# Patient Record
Sex: Female | Born: 1955 | Race: White | Hispanic: No | Smoking: Current every day smoker
Health system: Southern US, Community
[De-identification: ages and names within clinical notes are randomized; demographics above are authoritative.]

## PROBLEM LIST (undated history)

## (undated) DIAGNOSIS — I10 Essential (primary) hypertension: Secondary | ICD-10-CM

## (undated) DIAGNOSIS — F329 Major depressive disorder, single episode, unspecified: Secondary | ICD-10-CM

## (undated) DIAGNOSIS — J189 Pneumonia, unspecified organism: Secondary | ICD-10-CM

## (undated) DIAGNOSIS — M545 Low back pain, unspecified: Secondary | ICD-10-CM

## (undated) DIAGNOSIS — M48061 Spinal stenosis, lumbar region without neurogenic claudication: Secondary | ICD-10-CM

## (undated) DIAGNOSIS — S72009A Fracture of unspecified part of neck of unspecified femur, initial encounter for closed fracture: Secondary | ICD-10-CM

## (undated) DIAGNOSIS — M47819 Spondylosis without myelopathy or radiculopathy, site unspecified: Secondary | ICD-10-CM

## (undated) DIAGNOSIS — R51 Headache: Secondary | ICD-10-CM

## (undated) DIAGNOSIS — M459 Ankylosing spondylitis of unspecified sites in spine: Secondary | ICD-10-CM

## (undated) DIAGNOSIS — M1990 Unspecified osteoarthritis, unspecified site: Secondary | ICD-10-CM

## (undated) DIAGNOSIS — C50919 Malignant neoplasm of unspecified site of unspecified female breast: Secondary | ICD-10-CM

## (undated) DIAGNOSIS — R519 Headache, unspecified: Secondary | ICD-10-CM

## (undated) DIAGNOSIS — Z9289 Personal history of other medical treatment: Secondary | ICD-10-CM

## (undated) DIAGNOSIS — G4700 Insomnia, unspecified: Secondary | ICD-10-CM

## (undated) DIAGNOSIS — B1920 Unspecified viral hepatitis C without hepatic coma: Secondary | ICD-10-CM

## (undated) DIAGNOSIS — G8929 Other chronic pain: Secondary | ICD-10-CM

## (undated) DIAGNOSIS — Z9221 Personal history of antineoplastic chemotherapy: Secondary | ICD-10-CM

## (undated) DIAGNOSIS — F32A Depression, unspecified: Secondary | ICD-10-CM

## (undated) DIAGNOSIS — K921 Melena: Secondary | ICD-10-CM

## (undated) DIAGNOSIS — Z923 Personal history of irradiation: Secondary | ICD-10-CM

## (undated) DIAGNOSIS — F419 Anxiety disorder, unspecified: Secondary | ICD-10-CM

## (undated) DIAGNOSIS — M810 Age-related osteoporosis without current pathological fracture: Secondary | ICD-10-CM

## (undated) HISTORY — PX: FEMUR FRACTURE SURGERY: SHX633

## (undated) HISTORY — DX: Headache, unspecified: R51.9

## (undated) HISTORY — DX: Spondylosis without myelopathy or radiculopathy, site unspecified: M47.819

## (undated) HISTORY — DX: Unspecified viral hepatitis C without hepatic coma: B19.20

## (undated) HISTORY — DX: Age-related osteoporosis without current pathological fracture: M81.0

## (undated) HISTORY — DX: Depression, unspecified: F32.A

## (undated) HISTORY — DX: Ankylosing spondylitis of unspecified sites in spine: M45.9

## (undated) HISTORY — DX: Malignant neoplasm of unspecified site of unspecified female breast: C50.919

## (undated) HISTORY — DX: Low back pain: M54.5

## (undated) HISTORY — PX: FRACTURE SURGERY: SHX138

## (undated) HISTORY — PX: TUBAL LIGATION: SHX77

## (undated) HISTORY — DX: Unspecified osteoarthritis, unspecified site: M19.90

## (undated) HISTORY — DX: Spinal stenosis, lumbar region without neurogenic claudication: M48.061

## (undated) HISTORY — DX: Essential (primary) hypertension: I10

## (undated) HISTORY — DX: Low back pain, unspecified: M54.50

## (undated) HISTORY — DX: Other chronic pain: G89.29

## (undated) HISTORY — DX: Headache: R51

## (undated) HISTORY — DX: Melena: K92.1

## (undated) HISTORY — PX: FOOT SURGERY: SHX648

## (undated) HISTORY — DX: Personal history of other medical treatment: Z92.89

## (undated) HISTORY — DX: Insomnia, unspecified: G47.00

## (undated) HISTORY — DX: Major depressive disorder, single episode, unspecified: F32.9

---

## 1981-01-02 HISTORY — PX: BREAST BIOPSY: SHX20

## 2002-01-02 HISTORY — PX: KNEE SURGERY: SHX244

## 2005-01-02 DIAGNOSIS — B1920 Unspecified viral hepatitis C without hepatic coma: Secondary | ICD-10-CM

## 2005-01-02 HISTORY — DX: Unspecified viral hepatitis C without hepatic coma: B19.20

## 2005-11-22 ENCOUNTER — Emergency Department: Payer: Self-pay | Admitting: Internal Medicine

## 2005-12-30 ENCOUNTER — Other Ambulatory Visit: Payer: Self-pay

## 2005-12-30 ENCOUNTER — Emergency Department: Payer: Self-pay

## 2006-03-19 LAB — HM PAP SMEAR

## 2006-09-24 ENCOUNTER — Emergency Department: Payer: Self-pay | Admitting: Emergency Medicine

## 2008-01-03 DIAGNOSIS — C50919 Malignant neoplasm of unspecified site of unspecified female breast: Secondary | ICD-10-CM

## 2008-01-03 HISTORY — PX: BREAST EXCISIONAL BIOPSY: SUR124

## 2008-01-03 HISTORY — DX: Malignant neoplasm of unspecified site of unspecified female breast: C50.919

## 2008-01-03 HISTORY — PX: BREAST LUMPECTOMY: SHX2

## 2008-04-15 ENCOUNTER — Ambulatory Visit: Payer: Self-pay

## 2008-04-22 ENCOUNTER — Ambulatory Visit: Payer: Self-pay

## 2008-05-21 ENCOUNTER — Ambulatory Visit: Payer: Self-pay | Admitting: General Surgery

## 2008-05-25 ENCOUNTER — Ambulatory Visit: Payer: Self-pay | Admitting: General Surgery

## 2008-05-28 ENCOUNTER — Ambulatory Visit: Payer: Self-pay | Admitting: General Surgery

## 2008-06-02 ENCOUNTER — Ambulatory Visit: Payer: Self-pay | Admitting: Oncology

## 2008-06-09 ENCOUNTER — Ambulatory Visit: Payer: Self-pay | Admitting: Oncology

## 2008-06-22 ENCOUNTER — Ambulatory Visit: Payer: Self-pay | Admitting: General Surgery

## 2008-07-02 ENCOUNTER — Ambulatory Visit: Payer: Self-pay | Admitting: Oncology

## 2008-08-02 ENCOUNTER — Ambulatory Visit: Payer: Self-pay | Admitting: Oncology

## 2008-09-02 ENCOUNTER — Ambulatory Visit: Payer: Self-pay | Admitting: Oncology

## 2008-09-10 ENCOUNTER — Emergency Department: Payer: Self-pay | Admitting: Emergency Medicine

## 2008-10-02 ENCOUNTER — Ambulatory Visit: Payer: Self-pay | Admitting: Oncology

## 2008-11-02 ENCOUNTER — Ambulatory Visit: Payer: Self-pay | Admitting: Oncology

## 2008-12-02 ENCOUNTER — Ambulatory Visit: Payer: Self-pay | Admitting: Oncology

## 2008-12-09 ENCOUNTER — Ambulatory Visit: Payer: Self-pay

## 2009-01-02 ENCOUNTER — Ambulatory Visit: Payer: Self-pay | Admitting: Oncology

## 2009-02-02 ENCOUNTER — Ambulatory Visit: Payer: Self-pay | Admitting: Oncology

## 2009-02-16 ENCOUNTER — Inpatient Hospital Stay: Payer: Self-pay | Admitting: Psychiatry

## 2009-03-02 ENCOUNTER — Ambulatory Visit: Payer: Self-pay | Admitting: Oncology

## 2009-03-08 ENCOUNTER — Emergency Department: Payer: Self-pay | Admitting: Emergency Medicine

## 2009-04-02 ENCOUNTER — Ambulatory Visit: Payer: Self-pay | Admitting: Oncology

## 2009-04-05 ENCOUNTER — Ambulatory Visit: Payer: Self-pay | Admitting: Oncology

## 2009-04-27 ENCOUNTER — Ambulatory Visit: Payer: Self-pay | Admitting: General Surgery

## 2009-05-02 ENCOUNTER — Ambulatory Visit: Payer: Self-pay | Admitting: Oncology

## 2009-05-22 ENCOUNTER — Emergency Department: Payer: Self-pay | Admitting: Emergency Medicine

## 2009-06-02 ENCOUNTER — Ambulatory Visit: Payer: Self-pay | Admitting: Oncology

## 2009-06-19 ENCOUNTER — Emergency Department: Payer: Self-pay | Admitting: Emergency Medicine

## 2009-07-02 ENCOUNTER — Ambulatory Visit: Payer: Self-pay | Admitting: Oncology

## 2009-07-15 ENCOUNTER — Inpatient Hospital Stay: Payer: Self-pay | Admitting: Psychiatry

## 2009-08-02 ENCOUNTER — Ambulatory Visit: Payer: Self-pay | Admitting: Oncology

## 2009-09-02 ENCOUNTER — Ambulatory Visit: Payer: Self-pay | Admitting: Oncology

## 2009-09-02 ENCOUNTER — Emergency Department: Payer: Self-pay | Admitting: Internal Medicine

## 2009-09-08 ENCOUNTER — Ambulatory Visit: Payer: Self-pay | Admitting: Oncology

## 2009-09-14 LAB — CANCER ANTIGEN 27.29: CA 27.29: 27.7 U/mL (ref 0.0–38.6)

## 2009-10-02 ENCOUNTER — Ambulatory Visit: Payer: Self-pay | Admitting: Oncology

## 2009-10-20 ENCOUNTER — Ambulatory Visit: Payer: Self-pay

## 2009-11-02 ENCOUNTER — Ambulatory Visit: Payer: Self-pay | Admitting: Oncology

## 2009-12-08 ENCOUNTER — Ambulatory Visit: Payer: Self-pay | Admitting: Oncology

## 2009-12-09 LAB — CANCER ANTIGEN 27.29: CA 27.29: 30.2 U/mL (ref 0.0–38.6)

## 2010-01-02 ENCOUNTER — Ambulatory Visit: Payer: Self-pay | Admitting: Oncology

## 2010-01-15 ENCOUNTER — Emergency Department: Payer: Self-pay | Admitting: Emergency Medicine

## 2010-03-06 ENCOUNTER — Emergency Department: Payer: Self-pay | Admitting: Emergency Medicine

## 2010-03-09 ENCOUNTER — Ambulatory Visit: Payer: Self-pay | Admitting: Oncology

## 2010-03-10 LAB — CANCER ANTIGEN 27.29: CA 27.29: 31.3 U/mL (ref 0.0–38.6)

## 2010-04-03 ENCOUNTER — Ambulatory Visit: Payer: Self-pay | Admitting: Oncology

## 2010-04-17 ENCOUNTER — Emergency Department: Payer: Self-pay | Admitting: Emergency Medicine

## 2010-04-28 ENCOUNTER — Ambulatory Visit: Payer: Self-pay

## 2010-06-30 ENCOUNTER — Ambulatory Visit: Payer: Self-pay | Admitting: Oncology

## 2010-07-01 LAB — CANCER ANTIGEN 27.29: CA 27.29: 31.5 U/mL (ref 0.0–38.6)

## 2010-07-03 ENCOUNTER — Ambulatory Visit: Payer: Self-pay | Admitting: Oncology

## 2010-07-12 ENCOUNTER — Ambulatory Visit: Payer: Self-pay | Admitting: Pain Medicine

## 2010-07-24 ENCOUNTER — Emergency Department: Payer: Self-pay | Admitting: Unknown Physician Specialty

## 2010-09-07 ENCOUNTER — Emergency Department: Payer: Self-pay | Admitting: Emergency Medicine

## 2010-10-28 ENCOUNTER — Emergency Department: Payer: Self-pay

## 2010-12-01 ENCOUNTER — Ambulatory Visit: Payer: Self-pay | Admitting: General Surgery

## 2010-12-09 ENCOUNTER — Emergency Department: Payer: Self-pay | Admitting: Emergency Medicine

## 2011-01-06 ENCOUNTER — Ambulatory Visit: Payer: Self-pay | Admitting: Oncology

## 2011-01-19 ENCOUNTER — Ambulatory Visit: Payer: Self-pay | Admitting: Oncology

## 2011-01-20 LAB — CANCER ANTIGEN 27.29: CA 27.29: 30.6 U/mL (ref 0.0–38.6)

## 2011-02-03 ENCOUNTER — Ambulatory Visit: Payer: Self-pay | Admitting: Oncology

## 2011-02-03 LAB — CBC
HGB: 12.2 g/dL (ref 12.0–16.0)
MCH: 34.1 pg — ABNORMAL HIGH (ref 26.0–34.0)
MCHC: 34 g/dL (ref 32.0–36.0)
MCV: 100 fL (ref 80–100)
RBC: 3.58 10*6/uL — ABNORMAL LOW (ref 3.80–5.20)
WBC: 6.1 10*3/uL (ref 3.6–11.0)

## 2011-02-03 LAB — COMPREHENSIVE METABOLIC PANEL
Albumin: 3.5 g/dL (ref 3.4–5.0)
Alkaline Phosphatase: 115 U/L (ref 50–136)
Anion Gap: 6 — ABNORMAL LOW (ref 7–16)
Bilirubin,Total: 0.3 mg/dL (ref 0.2–1.0)
Calcium, Total: 8.8 mg/dL (ref 8.5–10.1)
Chloride: 102 mmol/L (ref 98–107)
Co2: 31 mmol/L (ref 21–32)
EGFR (African American): >60
EGFR (Non-African Amer.): >60
Glucose: 95 mg/dL (ref 65–99)
Osmolality: 277 (ref 275–301)
SGPT (ALT): 151 U/L — ABNORMAL HIGH
Sodium: 139 mmol/L (ref 136–145)

## 2011-02-03 LAB — ACETAMINOPHEN LEVEL: Acetaminophen: <2 ug/mL

## 2011-02-03 LAB — SALICYLATE LEVEL: Salicylates, Serum: 3 mg/dL — ABNORMAL HIGH

## 2011-02-03 LAB — ETHANOL
Ethanol %: <0.003 % (ref 0.000–0.080)
Ethanol: <3 mg/dL

## 2011-02-04 ENCOUNTER — Inpatient Hospital Stay: Payer: Self-pay | Admitting: Psychiatry

## 2011-02-04 LAB — URINALYSIS, COMPLETE
Blood: NEGATIVE
Glucose,UR: NEGATIVE mg/dL (ref 0–75)
Nitrite: NEGATIVE
Ph: 5 (ref 4.5–8.0)
Specific Gravity: 1.018 (ref 1.003–1.030)
Squamous Epithelial: 1

## 2011-02-04 LAB — DRUG SCREEN, URINE
Amphetamines, Ur Screen: NEGATIVE (ref ?–1000)
Benzodiazepine, Ur Scrn: POSITIVE (ref ?–200)
Cannabinoid 50 Ng, Ur ~~LOC~~: NEGATIVE (ref ?–50)
MDMA (Ecstasy)Ur Screen: NEGATIVE (ref ?–500)
Opiate, Ur Screen: NEGATIVE (ref ?–300)
Tricyclic, Ur Screen: NEGATIVE (ref ?–1000)

## 2011-02-06 LAB — HEPATIC FUNCTION PANEL A (ARMC)
Albumin: 3.6 g/dL
Alkaline Phosphatase: 152 U/L — ABNORMAL HIGH
Bilirubin, Direct: <0.1 mg/dL
Bilirubin,Total: 0.3 mg/dL
SGOT(AST): 104 U/L — ABNORMAL HIGH
SGPT (ALT): 138 U/L — ABNORMAL HIGH
Total Protein: 7.7 g/dL

## 2011-02-06 LAB — AMMONIA: Ammonia, Plasma: <25 umol/L

## 2011-02-07 LAB — FOLATE: Folic Acid: 12.4 ng/mL (ref 3.1–100.0)

## 2011-02-07 LAB — HEPATIC FUNCTION PANEL A (ARMC)
Albumin: 3.4 g/dL (ref 3.4–5.0)
Bilirubin, Direct: 0.1 mg/dL (ref 0.00–0.20)
Bilirubin,Total: 0.2 mg/dL (ref 0.2–1.0)
SGOT(AST): 114 U/L — ABNORMAL HIGH (ref 15–37)
Total Protein: 7.4 g/dL (ref 6.4–8.2)

## 2011-02-09 ENCOUNTER — Ambulatory Visit: Payer: Self-pay | Admitting: Oncology

## 2011-03-23 ENCOUNTER — Emergency Department: Payer: Self-pay | Admitting: Emergency Medicine

## 2011-03-23 LAB — CBC
HCT: 41.1 % (ref 35.0–47.0)
HGB: 14 g/dL (ref 12.0–16.0)
MCH: 33.7 pg (ref 26.0–34.0)
MCHC: 34 g/dL (ref 32.0–36.0)
MCV: 99 fL (ref 80–100)
Platelet: 256 10*3/uL (ref 150–440)
WBC: 7.3 10*3/uL (ref 3.6–11.0)

## 2011-03-23 LAB — BASIC METABOLIC PANEL
Anion Gap: 13 (ref 7–16)
BUN: 8 mg/dL (ref 7–18)
Co2: 24 mmol/L (ref 21–32)
Creatinine: 1.78 mg/dL — ABNORMAL HIGH (ref 0.60–1.30)
EGFR (African American): 38 — ABNORMAL LOW
Glucose: 94 mg/dL (ref 65–99)
Potassium: 3.8 mmol/L (ref 3.5–5.1)

## 2011-05-17 ENCOUNTER — Ambulatory Visit: Payer: Self-pay | Admitting: General Surgery

## 2011-06-24 ENCOUNTER — Emergency Department: Payer: Self-pay | Admitting: *Deleted

## 2011-08-01 ENCOUNTER — Emergency Department: Payer: Self-pay | Admitting: Emergency Medicine

## 2011-08-08 ENCOUNTER — Emergency Department: Payer: Self-pay | Admitting: *Deleted

## 2011-09-01 ENCOUNTER — Emergency Department: Payer: Self-pay | Admitting: Emergency Medicine

## 2011-09-01 LAB — BASIC METABOLIC PANEL
Anion Gap: 8 (ref 7–16)
BUN: 17 mg/dL (ref 7–18)
Calcium, Total: 9.5 mg/dL (ref 8.5–10.1)
EGFR (African American): >60
EGFR (Non-African Amer.): >60
Osmolality: 275 (ref 275–301)
Potassium: 3.9 mmol/L (ref 3.5–5.1)
Sodium: 137 mmol/L (ref 136–145)

## 2011-09-01 LAB — URINALYSIS, COMPLETE
Bacteria: NONE SEEN
Bilirubin,UR: NEGATIVE
Glucose,UR: NEGATIVE mg/dL (ref 0–75)
Ketone: NEGATIVE
Ph: 5 (ref 4.5–8.0)
Specific Gravity: 1.025 (ref 1.003–1.030)
Squamous Epithelial: 9
WBC UR: 32 /HPF (ref 0–5)

## 2011-09-01 LAB — CBC
HCT: 41.7 % (ref 35.0–47.0)
MCV: 98 fL (ref 80–100)
Platelet: 216 10*3/uL (ref 150–440)
RBC: 4.26 10*6/uL (ref 3.80–5.20)
WBC: 6.2 10*3/uL (ref 3.6–11.0)

## 2011-09-02 LAB — HEPATIC FUNCTION PANEL A (ARMC)
Alkaline Phosphatase: 150 U/L — ABNORMAL HIGH (ref 50–136)
SGPT (ALT): 158 U/L — ABNORMAL HIGH (ref 12–78)
Total Protein: 8.4 g/dL — ABNORMAL HIGH (ref 6.4–8.2)

## 2011-09-02 LAB — LIPASE, BLOOD: Lipase: 170 U/L (ref 73–393)

## 2011-09-02 LAB — TROPONIN I: Troponin-I: <0.02 ng/mL

## 2011-09-07 ENCOUNTER — Ambulatory Visit: Payer: Self-pay | Admitting: Oncology

## 2011-09-08 LAB — CANCER ANTIGEN 27.29: CA 27.29: 29.2 U/mL (ref 0.0–38.6)

## 2011-09-21 ENCOUNTER — Emergency Department: Payer: Self-pay | Admitting: Emergency Medicine

## 2011-09-22 LAB — COMPREHENSIVE METABOLIC PANEL
Alkaline Phosphatase: 144 U/L — ABNORMAL HIGH (ref 50–136)
Bilirubin,Total: 0.4 mg/dL (ref 0.2–1.0)
Chloride: 101 mmol/L (ref 98–107)
Co2: 30 mmol/L (ref 21–32)
Creatinine: 0.63 mg/dL (ref 0.60–1.30)
EGFR (African American): >60
Osmolality: 282 (ref 275–301)
SGPT (ALT): 167 U/L — ABNORMAL HIGH (ref 12–78)
Sodium: 140 mmol/L (ref 136–145)
Total Protein: 8.2 g/dL (ref 6.4–8.2)

## 2011-09-22 LAB — URINALYSIS, COMPLETE
Bacteria: NONE SEEN
Glucose,UR: NEGATIVE mg/dL (ref 0–75)
Ketone: NEGATIVE
Nitrite: NEGATIVE
Protein: NEGATIVE
Specific Gravity: 1.021 (ref 1.003–1.030)
WBC UR: 2 /HPF (ref 0–5)

## 2011-09-22 LAB — CBC WITH DIFFERENTIAL/PLATELET
Basophil #: 0.1 10*3/uL (ref 0.0–0.1)
Eosinophil #: 0.2 10*3/uL (ref 0.0–0.7)
HCT: 40.9 % (ref 35.0–47.0)
Lymphocyte %: 35.6 %
MCH: 33.7 pg (ref 26.0–34.0)
MCHC: 34.4 g/dL (ref 32.0–36.0)
Neutrophil %: 53.5 %
Platelet: 236 10*3/uL (ref 150–440)
RDW: 13.8 % (ref 11.5–14.5)

## 2011-09-22 LAB — LIPASE, BLOOD: Lipase: 115 U/L (ref 73–393)

## 2011-10-03 ENCOUNTER — Ambulatory Visit: Payer: Self-pay | Admitting: Oncology

## 2011-10-05 DIAGNOSIS — M1990 Unspecified osteoarthritis, unspecified site: Secondary | ICD-10-CM | POA: Insufficient documentation

## 2011-12-22 ENCOUNTER — Emergency Department: Payer: Self-pay | Admitting: Emergency Medicine

## 2012-02-03 ENCOUNTER — Ambulatory Visit: Payer: Self-pay | Admitting: Oncology

## 2012-02-24 ENCOUNTER — Emergency Department: Payer: Self-pay | Admitting: Emergency Medicine

## 2012-02-24 LAB — COMPREHENSIVE METABOLIC PANEL
Albumin: 3.7 g/dL (ref 3.4–5.0)
Anion Gap: 9 (ref 7–16)
BUN: 17 mg/dL (ref 7–18)
Bilirubin,Total: 0.5 mg/dL (ref 0.2–1.0)
Chloride: 104 mmol/L (ref 98–107)
Co2: 23 mmol/L (ref 21–32)
Creatinine: 0.48 mg/dL — ABNORMAL LOW (ref 0.60–1.30)
EGFR (Non-African Amer.): >60
Glucose: 104 mg/dL — ABNORMAL HIGH (ref 65–99)
Potassium: 3.7 mmol/L (ref 3.5–5.1)
SGOT(AST): 93 U/L — ABNORMAL HIGH (ref 15–37)
Sodium: 136 mmol/L (ref 136–145)

## 2012-02-24 LAB — URINALYSIS, COMPLETE
Bacteria: NONE SEEN
Glucose,UR: NEGATIVE mg/dL (ref 0–75)
Leukocyte Esterase: NEGATIVE
Ph: 5 (ref 4.5–8.0)
Protein: NEGATIVE
RBC,UR: <1 /HPF (ref 0–5)
Specific Gravity: 1.016 (ref 1.003–1.030)
WBC UR: 1 /HPF (ref 0–5)

## 2012-02-24 LAB — TROPONIN I: Troponin-I: <0.02 ng/mL

## 2012-02-24 LAB — CBC
HGB: 14 g/dL (ref 12.0–16.0)
MCH: 32.3 pg (ref 26.0–34.0)
MCV: 95 fL (ref 80–100)
Platelet: 241 10*3/uL (ref 150–440)

## 2012-03-02 ENCOUNTER — Ambulatory Visit: Payer: Self-pay | Admitting: Oncology

## 2012-03-08 LAB — CANCER ANTIGEN 27.29: CA 27.29: 21.8 U/mL (ref 0.0–38.6)

## 2012-04-02 ENCOUNTER — Ambulatory Visit: Payer: Self-pay | Admitting: Oncology

## 2012-05-20 ENCOUNTER — Ambulatory Visit: Payer: Self-pay | Admitting: Oncology

## 2012-06-11 ENCOUNTER — Emergency Department: Payer: Self-pay | Admitting: Emergency Medicine

## 2012-06-11 LAB — COMPREHENSIVE METABOLIC PANEL
Alkaline Phosphatase: 139 U/L — ABNORMAL HIGH (ref 50–136)
BUN: 17 mg/dL (ref 7–18)
Calcium, Total: 8.9 mg/dL (ref 8.5–10.1)
Chloride: 104 mmol/L (ref 98–107)
Co2: 27 mmol/L (ref 21–32)
EGFR (African American): >60
Glucose: 136 mg/dL — ABNORMAL HIGH (ref 65–99)
Potassium: 3.6 mmol/L (ref 3.5–5.1)
SGOT(AST): 186 U/L — ABNORMAL HIGH (ref 15–37)
SGPT (ALT): 195 U/L — ABNORMAL HIGH (ref 12–78)
Sodium: 138 mmol/L (ref 136–145)
Total Protein: 7.9 g/dL (ref 6.4–8.2)

## 2012-06-11 LAB — CBC
HCT: 40.8 % (ref 35.0–47.0)
MCH: 33.6 pg (ref 26.0–34.0)
MCHC: 34.5 g/dL (ref 32.0–36.0)
RDW: 14.9 % — ABNORMAL HIGH (ref 11.5–14.5)
WBC: 6 10*3/uL (ref 3.6–11.0)

## 2012-10-03 ENCOUNTER — Ambulatory Visit: Payer: Self-pay | Admitting: Oncology

## 2012-11-11 ENCOUNTER — Ambulatory Visit: Payer: Self-pay | Admitting: Oncology

## 2012-12-02 ENCOUNTER — Ambulatory Visit: Payer: Self-pay | Admitting: Oncology

## 2013-03-08 ENCOUNTER — Emergency Department: Payer: Self-pay | Admitting: Emergency Medicine

## 2013-03-20 ENCOUNTER — Ambulatory Visit: Payer: Self-pay | Admitting: Oncology

## 2013-03-28 ENCOUNTER — Ambulatory Visit: Payer: Self-pay | Admitting: Oncology

## 2013-04-02 ENCOUNTER — Ambulatory Visit: Payer: Self-pay | Admitting: Oncology

## 2013-07-07 ENCOUNTER — Ambulatory Visit: Payer: Self-pay | Admitting: Family Medicine

## 2013-09-14 ENCOUNTER — Emergency Department: Payer: Self-pay | Admitting: Emergency Medicine

## 2013-09-14 LAB — COMPREHENSIVE METABOLIC PANEL
ALBUMIN: 3.5 g/dL (ref 3.4–5.0)
ANION GAP: 6 — AB (ref 7–16)
AST: 127 U/L — AB (ref 15–37)
Alkaline Phosphatase: 139 U/L — ABNORMAL HIGH
BUN: 12 mg/dL (ref 7–18)
Bilirubin,Total: 0.4 mg/dL (ref 0.2–1.0)
CALCIUM: 9.2 mg/dL (ref 8.5–10.1)
CREATININE: 0.71 mg/dL (ref 0.60–1.30)
Chloride: 104 mmol/L (ref 98–107)
Co2: 28 mmol/L (ref 21–32)
EGFR (Non-African Amer.): >60
GLUCOSE: 107 mg/dL — AB (ref 65–99)
Osmolality: 276 (ref 275–301)
POTASSIUM: 4.1 mmol/L (ref 3.5–5.1)
SGPT (ALT): 165 U/L — ABNORMAL HIGH
Sodium: 138 mmol/L (ref 136–145)
TOTAL PROTEIN: 8 g/dL (ref 6.4–8.2)

## 2013-09-14 LAB — CBC
HCT: 43.4 % (ref 35.0–47.0)
HGB: 14.3 g/dL (ref 12.0–16.0)
MCH: 32.4 pg (ref 26.0–34.0)
MCHC: 32.9 g/dL (ref 32.0–36.0)
MCV: 98 fL (ref 80–100)
Platelet: 211 10*3/uL (ref 150–440)
RBC: 4.41 10*6/uL (ref 3.80–5.20)
RDW: 14 % (ref 11.5–14.5)
WBC: 6.4 10*3/uL (ref 3.6–11.0)

## 2013-09-16 ENCOUNTER — Encounter: Payer: Self-pay | Admitting: *Deleted

## 2013-09-29 ENCOUNTER — Ambulatory Visit: Payer: Self-pay | Admitting: General Surgery

## 2013-10-07 ENCOUNTER — Ambulatory Visit: Payer: Self-pay | Admitting: General Surgery

## 2013-10-15 ENCOUNTER — Inpatient Hospital Stay: Payer: Self-pay | Admitting: Psychiatry

## 2013-10-15 LAB — DRUG SCREEN, URINE
AMPHETAMINES, UR SCREEN: NEGATIVE (ref ?–1000)
BENZODIAZEPINE, UR SCRN: POSITIVE (ref ?–200)
Barbiturates, Ur Screen: NEGATIVE (ref ?–200)
CANNABINOID 50 NG, UR ~~LOC~~: NEGATIVE (ref ?–50)
Cocaine Metabolite,Ur ~~LOC~~: NEGATIVE (ref ?–300)
MDMA (Ecstasy)Ur Screen: NEGATIVE (ref ?–500)
METHADONE, UR SCREEN: NEGATIVE (ref ?–300)
OPIATE, UR SCREEN: NEGATIVE (ref ?–300)
PHENCYCLIDINE (PCP) UR S: NEGATIVE (ref ?–25)
Tricyclic, Ur Screen: NEGATIVE (ref ?–1000)

## 2013-10-15 LAB — COMPREHENSIVE METABOLIC PANEL
ALBUMIN: 3.4 g/dL (ref 3.4–5.0)
ALK PHOS: 155 U/L — AB
ALT: 201 U/L — AB
AST: 159 U/L — AB (ref 15–37)
Anion Gap: 9 (ref 7–16)
BILIRUBIN TOTAL: 0.2 mg/dL (ref 0.2–1.0)
BUN: 12 mg/dL (ref 7–18)
Calcium, Total: 8 mg/dL — ABNORMAL LOW (ref 8.5–10.1)
Chloride: 107 mmol/L (ref 98–107)
Co2: 26 mmol/L (ref 21–32)
Creatinine: 0.79 mg/dL (ref 0.60–1.30)
EGFR (Non-African Amer.): >60
GLUCOSE: 104 mg/dL — AB (ref 65–99)
Osmolality: 283 (ref 275–301)
Potassium: 3.3 mmol/L — ABNORMAL LOW (ref 3.5–5.1)
Sodium: 142 mmol/L (ref 136–145)
Total Protein: 7.9 g/dL (ref 6.4–8.2)

## 2013-10-15 LAB — CBC
HCT: 42.6 % (ref 35.0–47.0)
HGB: 13.7 g/dL (ref 12.0–16.0)
MCH: 31.5 pg (ref 26.0–34.0)
MCHC: 32.2 g/dL (ref 32.0–36.0)
MCV: 98 fL (ref 80–100)
Platelet: 220 10*3/uL (ref 150–440)
RBC: 4.35 10*6/uL (ref 3.80–5.20)
RDW: 13.9 % (ref 11.5–14.5)
WBC: 7.9 10*3/uL (ref 3.6–11.0)

## 2013-10-15 LAB — URINALYSIS, COMPLETE
Bacteria: NONE SEEN
Bilirubin,UR: NEGATIVE
Glucose,UR: NEGATIVE mg/dL (ref 0–75)
Ketone: NEGATIVE
Leukocyte Esterase: NEGATIVE
NITRITE: NEGATIVE
PH: 5 (ref 4.5–8.0)
PROTEIN: NEGATIVE
RBC,UR: 6 /HPF (ref 0–5)
Specific Gravity: 1.019 (ref 1.003–1.030)
Squamous Epithelial: 2
WBC UR: 3 /HPF (ref 0–5)

## 2013-10-15 LAB — SALICYLATE LEVEL: Salicylates, Serum: 12.2 mg/dL — ABNORMAL HIGH

## 2013-10-15 LAB — ACETAMINOPHEN LEVEL: Acetaminophen: <2 ug/mL

## 2013-10-15 LAB — ETHANOL: Ethanol: <3 mg/dL

## 2013-10-20 ENCOUNTER — Telehealth: Payer: Self-pay | Admitting: Family Medicine

## 2013-10-20 NOTE — Telephone Encounter (Signed)
I am part time now and not working in more new patients- I am actually cutting back on them.  I need to keep appointments open for current patients.  Please see if another provider can see her.

## 2013-10-20 NOTE — Telephone Encounter (Signed)
Patient has a new patient appointment in December.  Patient was admitted to [facility].  She admitted herself for depression.  Patient is passing blood in her stool.  Patient wants to know if you can see her sooner than December.

## 2013-10-28 ENCOUNTER — Telehealth: Payer: Self-pay

## 2013-10-28 NOTE — Telephone Encounter (Signed)
Pt left v/m; pt received letter from [facility] that Dr Deborra Medina had approved Ketorolac tromathamine; pt has taken this med before and med did not help. Pt request cb why Dr Deborra Medina approved this med. Pt has appt with Dr Deborra Medina on 12/23/13.Please advise.

## 2013-10-29 ENCOUNTER — Encounter: Payer: Self-pay | Admitting: *Deleted

## 2013-10-29 NOTE — Telephone Encounter (Signed)
This pt has never been seen in this office and does not establish until Dec. I have not processed a PA for this pt and we are unaware what she is speaking of.

## 2013-10-29 NOTE — Telephone Encounter (Signed)
Patient notified as instructed by telephone. Pt states she will wait to see Dr Deborra Medina in Dec.

## 2013-11-10 ENCOUNTER — Ambulatory Visit: Payer: Self-pay | Admitting: Oncology

## 2013-12-23 ENCOUNTER — Encounter: Payer: Self-pay | Admitting: Family Medicine

## 2013-12-23 ENCOUNTER — Ambulatory Visit (INDEPENDENT_AMBULATORY_CARE_PROVIDER_SITE_OTHER): Payer: Medicare Other | Admitting: Family Medicine

## 2013-12-23 VITALS — BP 122/70 | HR 67 | Temp 97.8°F | Ht 61.0 in | Wt 148.8 lb

## 2013-12-23 DIAGNOSIS — Z853 Personal history of malignant neoplasm of breast: Secondary | ICD-10-CM | POA: Insufficient documentation

## 2013-12-23 DIAGNOSIS — G8929 Other chronic pain: Secondary | ICD-10-CM | POA: Insufficient documentation

## 2013-12-23 DIAGNOSIS — F329 Major depressive disorder, single episode, unspecified: Secondary | ICD-10-CM | POA: Insufficient documentation

## 2013-12-23 DIAGNOSIS — M457 Ankylosing spondylitis of lumbosacral region: Secondary | ICD-10-CM

## 2013-12-23 DIAGNOSIS — F39 Unspecified mood [affective] disorder: Secondary | ICD-10-CM | POA: Insufficient documentation

## 2013-12-23 DIAGNOSIS — F418 Other specified anxiety disorders: Secondary | ICD-10-CM

## 2013-12-23 DIAGNOSIS — F32A Depression, unspecified: Secondary | ICD-10-CM | POA: Insufficient documentation

## 2013-12-23 NOTE — Progress Notes (Signed)
Pre visit review using our clinic review tool, if applicable. No additional management support is needed unless otherwise documented below in the visit note. 

## 2013-12-23 NOTE — Assessment & Plan Note (Signed)
>  30 minutes spent in face to face time with patient, >50% spent in counselling or coordination of care. I looked her up in the controlled substances data base- last received oxycodone- 20 tablets on 09/16/13 from Dr. Corky Downs eventhough she told me she has not had any in over a year.  I explained she also has red flags listed in her past history and under no circumstances would I prescribe her narcotics or any other controlled substances, including xanax.  I did offer (multiple times) to refer her to pain clinic and psychiatrist.  She refused and got up and left- very angry with the outcome of this appointment.  "I don't understand why you cannot give me anything for pain today."  Doctor shopping is illegal and if she does call office again looking for narcotics or other controlled substances, staff needs to reiterate that I will not prescribe them for her.

## 2013-12-23 NOTE — Progress Notes (Signed)
Subjective:   Patient ID: Janet Zuniga, female    DOB: 1955/12/11, 58 y.o.   MRN: [id number]  Janet Zuniga is a pleasant 58 y.o. year old female with h/o breast CA ( 5 years ago s/p chemo and radiation), Hep C, anxiety and depression who presents to clinic today with Establish Care and Depression  on 12/23/2013  HPI:  Chronic pain- on disability for this.  States she is followed by [facility] for Ankylosing spondylitis. Looked up her chart in care everywhere- last seen at [facility] on 10/05/2011.  Note reviewed- Dr. Koleen Nimrod stated that she was not diagnosed with AS and also discussed her medication overuse and drug seeking behavior.  She is here today because she is asking for me to give her something for pain.  Was previously prescribed oxycodone- has not had it in "over a year."  Was referred to pain clinic per pt but they would not see her.  Also is asking for xanax.  States she has been very depressed and anxious lately.  She is taking prozac. According to Care Everywhere, she was admitted to behavioral health and did attempt suicide by overdosing on xanax. It is also mentioned in previous notes that she had seizures when withdrawing from xanax in past. Also told me she has not been prescribed anything else for her depression or anxiety but seroquel is on her med list from [place].  No current outpatient prescriptions on file prior to visit.   No current facility-administered medications on file prior to visit.    Allergies  Allergen Reactions  . Penicillins Rash    Past Medical History  Diagnosis Date  . Arthritis   . Blood in stool   . Hepatitis C 2007  . History of blood transfusion     Past Surgical History  Procedure Laterality Date  . Foot surgery  2000 and 2003  . Knee surgery Left 2004  . Breast lumpectomy Left 2010    Family History  Problem Relation Age of Onset  . Arthritis Mother   . Stroke Mother   . Heart disease Mother   . Hypertension Mother     . Hypertension Father   . Hypertension Brother   . Heart disease Maternal Aunt     History   Social History  . Marital Status: Single    Spouse Name: N/A    Number of Children: N/A  . Years of Education: N/A   Occupational History  . Not on file.   Social History Main Topics  . Smoking status: Current Every Day Smoker  . Smokeless tobacco: Never Used  . Alcohol Use: Yes  . Drug Use: No  . Sexual Activity: No   Other Topics Concern  . Not on file   Social History Narrative  . No narrative on file   The PMH, PSH, Social History, Family History, Medications, and allergies have been reviewed in [facility], and have been updated if relevant.   Review of Systems    See HPI Pan positive- pain "everywhere," anxiety, depression, swelling of joints, fatigue, decreased appetite, abdominal pain, nausea, headaches Objective:    BP 122/70 mmHg  Pulse 67  Temp(Src) 97.8 F (36.6 C) (Oral)  Ht 5\' 1"  (1.549 m)  Wt 148 lb 12 oz (67.473 kg)  BMI 28.12 kg/m2  SpO2 97%   Physical Exam  Constitutional: She is oriented to person, place, and time. She appears well-developed and well-nourished.  tearful  HENT:  Head: Normocephalic.  Cardiovascular: Normal  rate.   Pulmonary/Chest: Effort normal.  Neurological: She is alert and oriented to person, place, and time.  Skin: Skin is warm and dry.  Psychiatric:  Histrionic, tearful, emotionally labile  Nursing note and vitals reviewed.         Assessment & Plan:   History of breast cancer in female  Chronic pain  Ankylosing spondylitis of lumbosacral region No Follow-up on file.

## 2013-12-24 ENCOUNTER — Telehealth: Payer: Self-pay | Admitting: Family Medicine

## 2013-12-24 NOTE — Telephone Encounter (Signed)
emmi mailed  °

## 2014-01-18 DIAGNOSIS — M545 Low back pain: Secondary | ICD-10-CM | POA: Diagnosis not present

## 2014-03-12 DIAGNOSIS — B1920 Unspecified viral hepatitis C without hepatic coma: Secondary | ICD-10-CM | POA: Insufficient documentation

## 2014-03-12 DIAGNOSIS — M545 Low back pain, unspecified: Secondary | ICD-10-CM | POA: Insufficient documentation

## 2014-04-03 ENCOUNTER — Ambulatory Visit
Admit: 2014-04-03 | Disposition: A | Discharge status: Home / Self Care | Payer: Self-pay | Attending: Oncology | Admitting: Oncology

## 2014-04-21 ENCOUNTER — Emergency Department
Admit: 2014-04-21 | Disposition: A | Discharge status: Home / Self Care | Payer: Self-pay | Admitting: Emergency Medicine

## 2014-04-21 LAB — COMPREHENSIVE METABOLIC PANEL
ALBUMIN: 4.3 g/dL
ANION GAP: 9 (ref 7–16)
Alkaline Phosphatase: 127 U/L — ABNORMAL HIGH
BILIRUBIN TOTAL: 0.8 mg/dL
BUN: 8 mg/dL
Calcium, Total: 9 mg/dL
Chloride: 106 mmol/L
Co2: 25 mmol/L
Creatinine: 1.29 mg/dL — ABNORMAL HIGH
EGFR (African American): 54 — ABNORMAL LOW
EGFR (Non-African Amer.): 46 — ABNORMAL LOW
GLUCOSE: 119 mg/dL — AB
POTASSIUM: 3.4 mmol/L — AB
SGOT(AST): 77 U/L — ABNORMAL HIGH
SGPT (ALT): 78 U/L — ABNORMAL HIGH
SODIUM: 140 mmol/L
Total Protein: 8.6 g/dL — ABNORMAL HIGH

## 2014-04-21 LAB — CBC WITH DIFFERENTIAL/PLATELET
Basophil #: 0.1 10*3/uL (ref 0.0–0.1)
Basophil %: 1.8 %
EOS ABS: 0 10*3/uL (ref 0.0–0.7)
Eosinophil %: 0.3 %
HCT: 45.6 % (ref 35.0–47.0)
HGB: 15.2 g/dL (ref 12.0–16.0)
LYMPHS PCT: 37.5 %
Lymphocyte #: 2.4 10*3/uL (ref 1.0–3.6)
MCH: 32.8 pg (ref 26.0–34.0)
MCHC: 33.3 g/dL (ref 32.0–36.0)
MCV: 98 fL (ref 80–100)
Monocyte #: 0.4 x10 3/mm (ref 0.2–0.9)
Monocyte %: 6.8 %
NEUTROS ABS: 3.4 10*3/uL (ref 1.4–6.5)
Neutrophil %: 53.6 %
Platelet: 202 10*3/uL (ref 150–440)
RBC: 4.63 10*6/uL (ref 3.80–5.20)
RDW: 14.8 % — ABNORMAL HIGH (ref 11.5–14.5)
WBC: 6.4 10*3/uL (ref 3.6–11.0)

## 2014-04-21 LAB — TROPONIN I: Troponin-I: <0.03 ng/mL

## 2014-04-21 LAB — LIPASE, BLOOD: LIPASE: 32 U/L

## 2014-04-22 LAB — TROPONIN I

## 2014-04-25 NOTE — Consult Note (Signed)
PATIENT NAME:  Janet Zuniga, STONG MR#:  [id number] DATE OF BIRTH:  03/17/1955  DATE OF CONSULTATION:  10/15/2013  CONSULTING PHYSICIAN:  Gonzella Lex, MD  IDENTIFYING INFORMATION AND CHIEF COMPLAINT:  A 59 year old woman who came to the Emergency Room with chief complaint:  "I'm having a nervous breakdown."   HISTORY OF PRESENT ILLNESS:  The patient complains of severe depression accompanied by difficulty eating, difficulty sleeping, and constant low mood. She has been having suicidal thoughts, wishing that she were dead. She also has chronic pain problems and has been having worsening of her pain. She was not able to get into a pain clinic appointment. She has a past history of breast cancer. She has major stresses in her life currently. She reports that she cannot sleep well at night. She feels like she has no support from her family. She is not currently getting any outpatient psychiatric treatment.   PAST PSYCHIATRIC HISTORY:  She was last in the hospital about 4 years ago after an overdose. She has been treated with antidepressants in the past. She was previously on Prozac and Xanax. She has not been on Xanax now for some time from what I can tell. She does have a past history of suicide attempts.   PAST MEDICAL HISTORY:  The patient has ankylosing spondylitis and has chronic pain in her hips, legs, and arms. She has not been able to get into a pain clinic. She was previously taking narcotics, but it is unclear whether she has been able to get any recently. Additionally, she has chronic anxiety. Past history of breast cancer, which has been in remission for the past 5 years.   SUBSTANCE ABUSE HISTORY:  The patient denies alcohol, at least no problem with it, very occasional use. Denies that she abuses prescription drugs or any other drugs.   FAMILY HISTORY:  Unknown family history, probably none.   SOCIAL HISTORY:  The patient is living by herself. She has family around, but feels they are not  supportive.   CURRENT MEDICATIONS:  She has been taking Prozac 40 mg for years. She occasionally has gotten narcotics, not clear if she is getting any right now. No other known medication.   REVIEW OF SYSTEMS:  Cannot sleep. Cannot eat. Feels nervous all the time, depressed all the time. Passive suicidal ideation. No hallucinations. Pain in her upper extremities and legs and hips. Otherwise negative.   MENTAL STATUS EXAMINATION:  Slightly disheveled older woman who looks older than her stated age. Cooperative. Good eye contact. Normal psychomotor activity. Speech: Normal rate, tone, and volume. Affect:  Anxious and dysphoric. Mood stated as bad. Thoughts are lucid. No loosening of associations. Denies auditory or visual hallucinations. Denies homicidal ideation. Passive suicidal thoughts. Alert and oriented x 4. Can remember 3/3 objects immediately and 3/3 at 3 minutes. Long-term memory is intact. Judgment and insight are adequate. Normal fund of knowledge.   LABORATORY RESULTS:  Drug screen is positive for benzodiazepines, otherwise negative. Chemistry panel:  Elevated glucose of 104, potassium low at 3.3, elevated ALT of 201, elevated AST of 159. Alcohol level is negative. CBC is all normal.   Urinalysis:  Blood 1+, no bacteria, fewer white cells than red cells. Salicylates slightly elevated at 12.2.   VITAL SIGNS:  Temperature 98.1, pulse 81, respirations 20, blood pressure currently 160/90.   ASSESSMENT:  This is a 59 year old woman with a history of anxiety and depression and chronic pain, who comes in with passive suicidal ideation, feeling anxious  and overwhelmed and unsupported, cannot sleep well, needs admission because of suicidality and depression.   TREATMENT PLAN:  Admit to psychiatry. Restart the Prozac 20 mg a day. Trazodone 100 mg at night. She is now complaining that she wants Seroquel instead, so that is being started at 100 mg at night. Suicide precautions as well as close and  elopement precautions and fall precautions in place.   DIAGNOSIS, PRINCIPAL AND PRIMARY:  AXIS I:  Major depression, recurrent, severe.   SECONDARY DIAGNOSES: AXIS I:  Deferred.   AXIS II:  Deferred.   AXIS III:  Chronic pain, history of breast cancer.    ____________________________ Gonzella Lex, MD jtc:nb D: 10/15/2013 22:43:01 ET T: 10/15/2013 23:20:04 ET JOB#: [id number]  cc: Gonzella Lex, MD, <Dictator> Gonzella Lex MD ELECTRONICALLY SIGNED 10/16/2013 1:08

## 2014-04-25 NOTE — H&P (Signed)
PATIENT NAME:  Janet, Zuniga MR#:  [id number] DATE OF BIRTH:  [birth year]  DATE OF ADMISSION:  10/15/2013  REFERRING PHYSICIAN: Emergency Room MD.   ATTENDING PHYSICIAN: Baylen Buckner B. Bary Leriche, MD    IDENTIFYING DATA: Janet Zuniga is a 59 year old female with history of depression and anxiety.   CHIEF COMPLAINT: " I have been crying for 6 weeks."   HISTORY OF PRESENT ILLNESS: Janet Zuniga has a long history of anxiety for years. She has been misusing Xanax as prescribed by Dr. Brunetta Genera.  It is unclear when was last time that she did have a proper psychiatrist.  She reports that last prescription she received from Dr. Brunetta Genera, possibly before his office closed was in December 2014.  She was able to stretch her Xanax so she discontinued it gently without symptoms of Xanax withdrawal in February. She has been doing well ever since.  She reports good mood, normal level of activity, church participations, spending time with her family.  She now tells me that for the past 2 weeks or 6 weeks depends on how you ask, she has been increasingly depressed, crying, sad, with insomnia, feeling of guilt, hopelessness, worthlessness, poor energy and concentration, social isolation. She denies suicidal ideations.  She came to the hospital believing that if we can start her on the Xanax, that she will be fine.  She is not interested in taking any other medications, except for the Xanax.  She has no intention to follow up with a psychiatrist but lucky for her, she was able to secure an appointment with a new primary care provider.  She has been without one for several months, as her old doctor stopped taking her insurance.  She is hoping that if we start the Xanax, her primary care provider is going to continue it.  She reports multiple stressors.  First of all, her car was repossessed.  She could not afford it.  She stayed with her son for 5 months.  She felt that she took good care of his children and the household, but something  must have been wrong because she is no longer allowed to see grandchildren and the son has no contact with her.  She still has some church friends but feels very isolated and lonely.  She has absolutely nothing to do, nothing to look forward to.  She denies symptoms suggestive of bipolar mania.  She reports heightened anxiety with panic attacks.  She denies psychotic symptoms. She denies alcohol, illicit drugs or prescription pill abuse, but on admission, she was positive for benzodiazepines.   PAST PSYCHIATRIC HISTORY: She was hospitalized at [facility] psychiatric unit at least 3 times; last time in February 2013.  She has 1 suicide attempt by a Xanax overdose but denies it was a suicide intention.  At none of the discharges was the patient given  benzodiazepines.  Some admissions, she was diagnosed with bipolar disorder and offered antipsychotics and mood stabilizers. Diagnoses also included personality disorder and benzodiazepine dependence.  She has been tried on Risperdal, Cymbalta and Depakote among other medications.     FAMILY PSYCHIATRIC HISTORY: Mother had anxiety and depression on Xanax.   PAST MEDICAL HISTORY: Breast cancer survival of 4 years, ankylosing spondylitis.   ALLERGIES: PENICILLIN.   MEDICATIONS ON ADMISSION:  According to the list in the system; Prozac 40 mg, Xanax 1 mg 3 or 4 times a day,  Colace 100 mg twice a day. Soma 350 at 4 times a day.  I do  not believe the patient has any of these medications still prescribed.   SOCIAL HISTORY: She now lives by herself, very lonely. She does  have disability and  Medicare.   REVIEW OF SYSTEMS.  CONSTITUTIONAL: No fevers or chills. No weight changes.  EYES: No double or blurred vision.  ENT: No hearing loss.  RESPIRATORY: No shortness of breath or cough.  CARDIOVASCULAR: No chest pain or orthopnea.  GASTROINTESTINAL: No abdominal pain, nausea, vomiting, or diarrhea.  GENITOURINARY: No incontinence or  frequency.  ENDOCRINE: No heat or cold intolerance.  LYMPHATIC: No anemia or easy bruising.  INTEGUMENTARY: No acne or rash.  MUSCULOSKELETAL: Positive for back pain.  NEUROLOGIC: No tingling or weakness.  PSYCHIATRIC: See history of present illness for details.   PHYSICAL EXAMINATION:  VITAL SIGNS: Blood pressure 153/104, pulse 77, respirations 20, temperature 98.7.  GENERAL: This is a well-developed, middle-aged female in no acute distress.  HEENT: The pupils are equal, round, and reactive to light. Sclerae are anicteric.  NECK: Supple. No thyromegaly.  LUNGS: Clear to auscultation. No dullness to percussion.  HEART: Regular rhythm and rate. No murmurs, rubs, or gallops.  ABDOMEN: Soft, nontender, nondistended. Positive bowel sounds.  MUSCULOSKELETAL: Normal muscle strength in all extremities.  SKIN: No rashes or bruises.  LYMPHATIC: No cervical adenopathy.  NEUROLOGIC: Cranial nerves II through XII are intact.   LABORATORY DATA: Chemistries are within normal limits except for potassium of 3.3. Blood alcohol level is zero.  LFTs; alkaline phosphatase 155, AST 159, ALT 2001.  Urine tox screen is positive for benzodiazepines.  CBC within normal limits. Urinalysis is not suggestive of urinary tract infection. Serum acetaminophen less than 2. Serum salicylates 27.2.   MENTAL STATUS EXAMINATION ON ADMISSION:  The patient is alert and oriented to person, place, time and situation. She is pleasant, polite and cooperative, but irritable and demanding.  She recognizes me from previous admissions. She maintains good eye contact. She is well-groomed and casually dressed.  Her speech is of normal rhythm, rate and volume. Mood is depressed.  She has not been crying with lateral affect.  Thought process is logical and goal oriented with its own logic. She denies thoughts of hurting herself or others, but came to the hospital very depressed, unable to cope and press on.  There are no delusions or paranoia.  There are no auditory or visual hallucinations. Her cognition is grossly intact.  Registration, recall, long and short-term memory are intact.  She is of average intelligence and fund of knowledge.  Her insight and judgment are limited.    SUICIDE RISK ASSESSMENT ON ADMISSION:  This is a patient with a long history of depression, anxiety, and benzodiazepine abuse who came to the hospital complaining of worsening depression, but most likely to obtain benzodiazepines as she is positive on admission and wants to be restarted on the Xanax.  This is inappropriate.   INITIAL DIAGNOSES:  AXIS I: Major depressive disorder, recurrent, severe, history of diagnosis of bipolar disorder, benzodiazepine dependence.  AXIS II: Deferred.  AXIS III: Breast cancer survivor, ankylosing spondylitis, hepatitis.    PLAN:  The patient was admitted to [place] unit for safety, stabilization and medication management.  She was initially placed on suicide precautions and was closely monitored for any unsafe behaviors.  She underwent full psychiatric and risk assessment. She received pharmacotherapy, individual and group psychotherapy, substance abuse counseling, and support from therapeutic milieu.  1.  Suicidal ideation. She is able to contract for safety.  2.  Mood.  She was started on Prozac in the Emergency Room and trazodone for sleep along with Seroquel at bedtime. She complained that she did not sleep at all.  3.  Benzodiazepine withdrawal. She is likely in withdrawal.  We will offer brief Librium taper. No benzodiazepines will be prescribed for home.  We will offer hydroxyzine for symptoms of anxiety.  She is voluntarily here and it is quite likely that she will try to discharge herself against medical advice.  4.  Hepatitis.  Her liver function tests are quite elevated.  She also is positive for Tylenol,  I do not think she should be getting that.  It is unclear what pain  medication is available to her for her back problems.   DISPOSITION: She will be discharged back to home. She does not wish to be followed by a psychiatrist.     ____________________________ Wardell Honour. Bary Leriche, MD jbp:DT D: 10/16/2013 14:52:55 ET T: 10/16/2013 15:25:07 ET JOB#: [id number]  cc: Nicholle Falzon B. Bary Leriche, MD, <Dictator> Clovis Fredrickson MD ELECTRONICALLY SIGNED 11/10/2013 6:16
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
## 2014-05-04 DIAGNOSIS — F411 Generalized anxiety disorder: Secondary | ICD-10-CM | POA: Insufficient documentation

## 2014-05-11 ENCOUNTER — Ambulatory Visit: Payer: Self-pay | Admitting: Oncology

## 2014-05-15 DIAGNOSIS — G894 Chronic pain syndrome: Secondary | ICD-10-CM | POA: Insufficient documentation

## 2014-05-15 DIAGNOSIS — M47819 Spondylosis without myelopathy or radiculopathy, site unspecified: Secondary | ICD-10-CM | POA: Insufficient documentation

## 2014-05-15 DIAGNOSIS — I7300 Raynaud's syndrome without gangrene: Secondary | ICD-10-CM | POA: Insufficient documentation

## 2014-05-15 DIAGNOSIS — M48061 Spinal stenosis, lumbar region without neurogenic claudication: Secondary | ICD-10-CM | POA: Insufficient documentation

## 2014-05-23 ENCOUNTER — Emergency Department
Admission: EM | Admit: 2014-05-23 | Discharge: 2014-05-24 | Disposition: A | Discharge status: Home / Self Care | Payer: Medicare Other | Attending: Emergency Medicine | Admitting: Emergency Medicine

## 2014-05-23 DIAGNOSIS — Z79899 Other long term (current) drug therapy: Secondary | ICD-10-CM | POA: Insufficient documentation

## 2014-05-23 DIAGNOSIS — Z720 Tobacco use: Secondary | ICD-10-CM | POA: Diagnosis not present

## 2014-05-23 DIAGNOSIS — Z880 Allergy status to penicillin: Secondary | ICD-10-CM | POA: Diagnosis not present

## 2014-05-23 DIAGNOSIS — Z760 Encounter for issue of repeat prescription: Secondary | ICD-10-CM | POA: Insufficient documentation

## 2014-05-23 NOTE — ED Notes (Signed)
Pt walked up to desk, despite saying she was unable to walk, and asked about wait time.
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
## 2014-05-23 NOTE — ED Notes (Signed)
Pt asking about wait time. Pt in NAD

## 2014-05-24 MED ORDER — OXYCODONE-ACETAMINOPHEN 5-325 MG PO TABS: 1.0000 | ORAL_TABLET | ORAL | Status: DC | PRN

## 2014-05-24 MED ORDER — OXYCODONE-ACETAMINOPHEN 5-325 MG PO TABS
2.0000 | ORAL_TABLET | Freq: Once | ORAL | Status: AC
  Administered 2014-05-24: 2 via ORAL

## 2014-05-24 MED ORDER — OXYCODONE-ACETAMINOPHEN 5-325 MG PO TABS
ORAL_TABLET | ORAL | Status: AC
  Filled 2014-05-24: qty 2
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
## 2014-05-24 NOTE — ED Notes (Signed)
Pt again requesting "when is the doctor going to get in here?" i don't want to wait any longer. Explanation of md evaluation process provided to pt again.

## 2014-05-24 NOTE — ED Notes (Signed)
Pt states "i want the doctor to come in here now, i want my xanax. i don't understand why you can't just give me what i ask for right now, what's the hold up?' explanation of md evaluation process and treatment process provided to pt.

## 2014-05-24 NOTE — ED Notes (Signed)
Pt states "when is the doctor coming in here? i want my pain medicine and my xanax now, i don't want to wait another two hours." pt updated again on md assessment and possible arrival process.

## 2014-05-24 NOTE — ED Notes (Signed)
Pt again requesting "where is my xanax, when is the doctor going to get here and give it to me?" explanation of md evaluation process again provided to pt. tv channel changed for pt.

## 2014-05-24 NOTE — Discharge Instructions (Signed)
I can only give you a few percocets since that is what you lost. You will have to get your regular doctor to give you more.

## 2014-06-05 DIAGNOSIS — G4700 Insomnia, unspecified: Secondary | ICD-10-CM | POA: Insufficient documentation

## 2014-07-01 ENCOUNTER — Emergency Department: Payer: Medicare Other

## 2014-07-01 ENCOUNTER — Emergency Department
Admission: EM | Admit: 2014-07-01 | Discharge: 2014-07-01 | Disposition: A | Discharge status: Home / Self Care | Payer: Medicare Other | Attending: Emergency Medicine | Admitting: Emergency Medicine

## 2014-07-01 ENCOUNTER — Encounter: Payer: Self-pay | Admitting: Emergency Medicine

## 2014-07-01 DIAGNOSIS — Z720 Tobacco use: Secondary | ICD-10-CM | POA: Diagnosis not present

## 2014-07-01 DIAGNOSIS — Z9889 Other specified postprocedural states: Secondary | ICD-10-CM | POA: Diagnosis not present

## 2014-07-01 DIAGNOSIS — Z880 Allergy status to penicillin: Secondary | ICD-10-CM | POA: Insufficient documentation

## 2014-07-01 DIAGNOSIS — Z79899 Other long term (current) drug therapy: Secondary | ICD-10-CM | POA: Diagnosis not present

## 2014-07-01 DIAGNOSIS — M79671 Pain in right foot: Secondary | ICD-10-CM

## 2014-07-01 MED ORDER — OXYCODONE HCL 10 MG PO TABS: 10.0000 mg | ORAL_TABLET | Freq: Three times a day (TID) | ORAL | Status: DC

## 2014-07-01 MED ORDER — KETOROLAC TROMETHAMINE 60 MG/2ML IM SOLN
60.0000 mg | Freq: Once | INTRAMUSCULAR | Status: AC
  Administered 2014-07-01: 60 mg via INTRAMUSCULAR

## 2014-07-01 MED ORDER — KETOROLAC TROMETHAMINE 60 MG/2ML IM SOLN
INTRAMUSCULAR | Status: AC
  Administered 2014-07-01: 60 mg via INTRAMUSCULAR
  Filled 2014-07-01: qty 2
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
## 2014-07-01 NOTE — ED Notes (Signed)
C/o right foot pain x 5 days with no known injury, has had previous surgery to right foot

## 2014-07-01 NOTE — ED Notes (Signed)
Pt states pain in right foot, pt tearful, pt states hx of 2 surgreys in right foot, papable pulse in right foot, +2, pt able to wiggle toes

## 2014-07-01 NOTE — ED Notes (Signed)
All distal neuro intact, patient displays full AROM

## 2014-07-23 ENCOUNTER — Emergency Department
Admission: EM | Admit: 2014-07-23 | Discharge: 2014-07-23 | Disposition: A | Discharge status: Home / Self Care | Payer: Medicare Other | Attending: Emergency Medicine | Admitting: Emergency Medicine

## 2014-07-23 DIAGNOSIS — Z880 Allergy status to penicillin: Secondary | ICD-10-CM | POA: Insufficient documentation

## 2014-07-23 DIAGNOSIS — G8929 Other chronic pain: Secondary | ICD-10-CM | POA: Insufficient documentation

## 2014-07-23 DIAGNOSIS — Z79899 Other long term (current) drug therapy: Secondary | ICD-10-CM | POA: Insufficient documentation

## 2014-07-23 DIAGNOSIS — Z720 Tobacco use: Secondary | ICD-10-CM | POA: Diagnosis not present

## 2014-07-23 DIAGNOSIS — M549 Dorsalgia, unspecified: Secondary | ICD-10-CM | POA: Diagnosis present

## 2014-07-23 DIAGNOSIS — M5416 Radiculopathy, lumbar region: Secondary | ICD-10-CM | POA: Diagnosis not present

## 2014-07-23 DIAGNOSIS — M4806 Spinal stenosis, lumbar region: Secondary | ICD-10-CM | POA: Insufficient documentation

## 2014-07-23 DIAGNOSIS — M48061 Spinal stenosis, lumbar region without neurogenic claudication: Secondary | ICD-10-CM

## 2014-07-23 NOTE — Discharge Instructions (Signed)

## 2014-07-23 NOTE — ED Notes (Signed)
Patient here for back pain. States she cannot walk due to pain. Called EMS to come here.

## 2014-07-23 NOTE — ED Notes (Signed)
Patient here for chronic back pain. Possible ETOH per EMS

## 2014-07-23 NOTE — ED Notes (Addendum)
Patient yelling at the nursing station for pain medication. MD spoke with patient and informed her that he was not giving her pain medication at this time and was discharging her. Patient became agitated and began yelling and was given her discharge papers. Patient refused e signature and vital signs stating ,"I never should've come to this [facility]fors[facility] place."

## 2014-07-23 NOTE — ED Provider Notes (Signed)
[facility] Emergency Department Provider Note  ____________________________________________  Time seen: 8:30 PM  I have reviewed the triage vital signs and the nursing notes.   HISTORY  Chief Complaint Back Pain    HPI Janet Zuniga is a 59 y.o. female with chronic back pain and a recent MRI demonstrating multilevel degenerative arthritis and spinal stenosisand multiple recent ED visits here at [facility] as confirmed by review of Epic care everywhere records. She complains of persistent back pain radiating into both legs and states that she does not have a ride that will take her to [facility] to follow-up in spine surgery clinic, and requests to be kept in the hospital for multiple days for pain medicine or possibly transferred to [facility]. No new falls or injuries. No numbness tingling or weakness. No fevers or chills.     Past Medical History  Diagnosis Date  . Arthritis   . Blood in stool   . Hepatitis C 2007  . History of blood transfusion     Patient Active Problem List   Diagnosis Date Noted  . History of breast cancer in female 12/23/2013  . Chronic pain 12/23/2013  . Ankylosing spondylitis of lumbosacral region 12/23/2013  . Depression with anxiety 12/23/2013    Past Surgical History  Procedure Laterality Date  . Foot surgery  2000 and 2003  . Knee surgery Left 2004  . Breast lumpectomy Left 2010    Current Outpatient Rx  Name  Route  Sig  Dispense  Refill  . ALPRAZolam (XANAX) 1 MG tablet   Oral   Take 1 mg by mouth QID.         Marland Kitchen FLUoxetine (PROZAC) 40 MG capsule   Oral   Take 40 mg by mouth daily at 3 pm.         . oxyCODONE-acetaminophen (ROXICET) 5-325 MG per tablet   Oral   Take 1 tablet by mouth every 4 (four) hours as needed for severe pain.   20 tablet   0   . traZODone (DESYREL) 150 MG tablet   Oral   Take by mouth at bedtime.         . Oxycodone HCl 10 MG TABS   Oral   Take 1 tablet (10 mg  total) by mouth 3 (three) times daily.   12 tablet   0     Allergies Penicillins  Family History  Problem Relation Age of Onset  . Arthritis Mother   . Stroke Mother   . Heart disease Mother   . Hypertension Mother   . Hypertension Father   . Hypertension Brother   . Heart disease Maternal Aunt     Social History History  Substance Use Topics  . Smoking status: Current Every Day Smoker  . Smokeless tobacco: Never Used  . Alcohol Use: Yes    Review of Systems  Constitutional: No fever or chills. No weight changes Eyes:No blurry vision or double vision.  ENT: No sore throat. Cardiovascular: No chest pain. Respiratory: No dyspnea or cough. Gastrointestinal: Negative for abdominal pain, vomiting and diarrhea.  No BRBPR or melena. Genitourinary: Negative for dysuria, urinary retention, bloody urine, or difficulty urinating. Musculoskeletal: Chronic back pain.. Skin: Negative for rash. Neurological: Negative for headaches, focal weakness or numbness. Psychiatric:No anxiety or depression.   Endocrine:No hot/cold intolerance, changes in energy, or sleep difficulty.  10-point ROS otherwise negative.  ____________________________________________   PHYSICAL EXAM:  VITAL SIGNS: ED Triage Vitals  Enc Vitals Group  BP 07/23/14 2000 129/73 mmHg     Pulse Rate 07/23/14 2000 70     Resp 07/23/14 2000 18     Temp 07/23/14 2000 97.9 F (36.6 C)     Temp Source 07/23/14 2000 Oral     SpO2 07/23/14 2000 100 %     Weight 07/23/14 2000 133 lb (60.328 kg)     Height 07/23/14 2000 5' (1.524 m)     Head Cir --      Peak Flow --      Pain Score 07/23/14 2005 10     Pain Loc --      Pain Edu? --      Excl. in [place]? --      Constitutional: Alert and oriented. Well appearing and in no distress. Eyes: No scleral icterus. No conjunctival pallor. PERRL. EOMI ENT   Head: Normocephalic and atraumatic.   Nose: No congestion/rhinnorhea. No septal hematoma    Mouth/Throat: MMM, no pharyngeal erythema. No peritonsillar mass. No uvula shift.   Neck: No stridor. No SubQ emphysema. No meningismus. Hematological/Lymphatic/Immunilogical: No cervical lymphadenopathy. Cardiovascular: RRR. Normal and symmetric distal pulses are present in all extremities. No murmurs, rubs, or gallops. Respiratory: Normal respiratory effort without tachypnea nor retractions. Breath sounds are clear and equal bilaterally. No wheezes/rales/rhonchi. Gastrointestinal: Soft and nontender. No distention. There is no CVA tenderness.  No rebound, rigidity, or guarding. Genitourinary: deferred Musculoskeletal: Straight leg raise positive at 45 in both legs. No midline spinal tenderness. Steady Swift gait. Neurologic:   Normal speech and language.  CN 2-10 normal. Motor grossly intact. Normal gait. Good plantarflexion and dorsiflexion of both feet. No gross focal neurologic deficits are appreciated.  Skin:  Skin is warm, dry and intact. No rash noted.  No petechiae, purpura, or bullae. Psychiatric: Mood and affect are normal. Speech and behavior are normal. Patient exhibits appropriate insight and judgment.  ____________________________________________    LABS (pertinent positives/negatives) (all labs ordered are listed, but only abnormal results are displayed) Labs Reviewed - No data to display ____________________________________________   EKG    ____________________________________________    RADIOLOGY    ____________________________________________   PROCEDURES  ____________________________________________   INITIAL IMPRESSION / ASSESSMENT AND PLAN / ED COURSE  Pertinent labs & imaging results that were available during my care of the patient were reviewed by me and considered in my medical decision making (see chart for details).  Patient presents with chronic back pain request requesting opioid pain medicines. Review of [place] controlled  substance reporting system reviews multiple opioid prescriptions recently. She is also seen in the ED at [facility] 2 days ago for the same complaint. She has not yet followed up with necessary specialist. No evidence of acute issue such as cauda equina epidural abscess or hematoma or other acute pathology. I again advised her of our department policy not to provide opioid pain medications for chronic pain. I did offer the patient a 5 mg oxycodone once while here, but the patient refused. She then walked out of the department without assistance after being given her discharge papers. She noted that she had a ride here waiting for her.  ____________________________________________   FINAL CLINICAL IMPRESSION(S) / ED DIAGNOSES  Final diagnoses:  Chronic pain  Spinal stenosis of lumbar region with radiculopathy      Carrie Mew, MD 07/23/14 2052

## 2014-09-16 ENCOUNTER — Emergency Department
Admission: EM | Admit: 2014-09-16 | Discharge: 2014-09-16 | Disposition: A | Discharge status: Home / Self Care | Payer: Medicare Other | Attending: Student | Admitting: Student

## 2014-09-16 ENCOUNTER — Encounter: Payer: Self-pay | Admitting: Emergency Medicine

## 2014-09-16 DIAGNOSIS — Z79891 Long term (current) use of opiate analgesic: Secondary | ICD-10-CM | POA: Diagnosis not present

## 2014-09-16 DIAGNOSIS — Z720 Tobacco use: Secondary | ICD-10-CM | POA: Diagnosis not present

## 2014-09-16 DIAGNOSIS — R030 Elevated blood-pressure reading, without diagnosis of hypertension: Secondary | ICD-10-CM | POA: Diagnosis not present

## 2014-09-16 DIAGNOSIS — Z880 Allergy status to penicillin: Secondary | ICD-10-CM | POA: Insufficient documentation

## 2014-09-16 DIAGNOSIS — M79671 Pain in right foot: Secondary | ICD-10-CM | POA: Diagnosis not present

## 2014-09-16 DIAGNOSIS — Z79899 Other long term (current) drug therapy: Secondary | ICD-10-CM | POA: Insufficient documentation

## 2014-09-16 DIAGNOSIS — G8929 Other chronic pain: Secondary | ICD-10-CM | POA: Diagnosis not present

## 2014-09-16 MED ORDER — TRAMADOL HCL 50 MG PO TABS: 50.0000 mg | ORAL_TABLET | Freq: Four times a day (QID) | ORAL | Status: DC | PRN

## 2014-09-16 NOTE — ED Provider Notes (Signed)
[facility] Emergency Department Provider Note  ____________________________________________  Time seen: Approximately 1:02 PM  I have reviewed the triage vital signs and the nursing notes.   HISTORY  Chief Complaint Foot Pain    HPI Janet Zuniga is a 59 y.o. female states right foot pain for 3 weeks. Further history  shows this patient has chronic foot pain since the surgeries and 2000. Patient states she is scheduled to see an orthopedics for her back and foot on 08/14/2014. Patient is requesting an INR performed in the emergency room for definitive evaluation. Patient recently had x-ray on her last ER visit showing degenerative changes and surgical changes secondary to her continued complaint. Patient requesting pain medication to last until she see an orthopedist on 10/14/2014. Patient is rating the pain as 8/10 and told we do not give tramadol for pain because Motrin works better. It was noted in the narcotic registry that the patient was seen oxycodone monthly.Patient stated no palliative measures for this complaint at this time.   Past Medical History  Diagnosis Date  . Arthritis   . Blood in stool   . Hepatitis C 2007  . History of blood transfusion     Patient Active Problem List   Diagnosis Date Noted  . History of breast cancer in female 12/23/2013  . Chronic pain 12/23/2013  . Ankylosing spondylitis of lumbosacral region 12/23/2013  . Depression with anxiety 12/23/2013    Past Surgical History  Procedure Laterality Date  . Foot surgery  2000 and 2003  . Knee surgery Left 2004  . Breast lumpectomy Left 2010    Current Outpatient Rx  Name  Route  Sig  Dispense  Refill  . ALPRAZolam (XANAX) 1 MG tablet   Oral   Take 1 mg by mouth QID.         Marland Kitchen FLUoxetine (PROZAC) 40 MG capsule   Oral   Take 40 mg by mouth daily at 3 pm.         . Oxycodone HCl 10 MG TABS   Oral   Take 1 tablet (10 mg total) by mouth 3 (three) times daily.    12 tablet   0   . oxyCODONE-acetaminophen (ROXICET) 5-325 MG per tablet   Oral   Take 1 tablet by mouth every 4 (four) hours as needed for severe pain.   20 tablet   0   . traZODone (DESYREL) 150 MG tablet   Oral   Take by mouth at bedtime.           Allergies Penicillins  Family History  Problem Relation Age of Onset  . Arthritis Mother   . Stroke Mother   . Heart disease Mother   . Hypertension Mother   . Hypertension Father   . Hypertension Brother   . Heart disease Maternal Aunt     Social History Social History  Substance Use Topics  . Smoking status: Current Every Day Smoker  . Smokeless tobacco: Never Used  . Alcohol Use: Yes    Review of Systems Constitutional: No fever/chills Eyes: No visual changes. ENT: No sore throat. Cardiovascular: Denies chest pain. Respiratory: Denies shortness of breath. Gastrointestinal: No abdominal pain.  No nausea, no vomiting.  No diarrhea.  No constipation. Genitourinary: Negative for dysuria. Musculoskeletal: Right foot pain  Skin: Negative for rash. Neurological: Negative for headaches, focal weakness or numbness. 10-point ROS otherwise negative.  ____________________________________________   PHYSICAL EXAM:  VITAL SIGNS: ED Triage Vitals  Enc Vitals  Group     BP 09/16/14 1156 172/102 mmHg     Pulse Rate 09/16/14 1156 78     Resp 09/16/14 1156 20     Temp 09/16/14 1156 97.8 F (36.6 C)     Temp Source 09/16/14 1156 Oral     SpO2 09/16/14 1156 99 %     Weight 09/16/14 1156 130 lb (58.968 kg)     Height 09/16/14 1156 5\' 6"  (1.676 m)     Head Cir --      Peak Flow --      Pain Score 09/16/14 1159 9     Pain Loc --      Pain Edu? --      Excl. in [place]? --     Constitutional: Alert and oriented. Well appearing and in no acute distress. Eyes: Conjunctivae are normal. PERRL. EOMI. Head: Atraumatic. Nose: No congestion/rhinnorhea. Mouth/Throat: Mucous membranes are moist.  Oropharynx  non-erythematous. Neck: No stridor.   Hematological/Lymphatic/Immunilogical: No cervical lymphadenopathy. Cardiovascular: Normal rate, regular rhythm. Grossly normal heart sounds.  Good peripheral circulation. Elevated blood pressure patient did not want to be evaluated for this complaint.  Respiratory: Normal respiratory effort.  No retractions. Lungs CTAB. Gastrointestinal: Soft and nontender. No distention. No abdominal bruits. No CVA tenderness. Musculoskeletal: No obvious deformity postsurgical scars consistent with history. Patient's tender palpation dorsal aspect of the first metatarsal. Neurovascular intact.  Neurologic:  Normal speech and language. No gross focal neurologic deficits are appreciated. No gait instability. Skin:  Skin is warm, dry and intact. No rash noted. Psychiatric: Mood and affect are normal. Speech and behavior are normal.  ____________________________________________   LABS (all labs ordered are listed, but only abnormal results are displayed)  Labs Reviewed - No data to display ____________________________________________  EKG   ____________________________________________  RADIOLOGY  Reviewed x-ray from previous visit 2 months ago revealing degenerative changes postsurgical changes to the first metatarsal head. ____________________________________________   PROCEDURES  Procedure(s) performed: None  Critical Care performed: No  ____________________________________________   INITIAL IMPRESSION / ASSESSMENT AND PLAN / ED COURSE  Pertinent labs & imaging results that were available during my care of the patient were reviewed by me and considered in my medical decision making (see chart for details).  Chronic right foot pain status post surgery over 10 years ago. Advised patient to follow-up for scheduled orthopedic appointment patient refuses a referral to podiatry at this time. Advised patient that I was not prescribing oxycodone for her  chronic pain FOR a three-day supply of tramadol and advised to continue taking ibuprofen since it gets of some moderate relief. ____________________________________________   FINAL CLINICAL IMPRESSION(S) / ED DIAGNOSES  Final diagnoses:  Chronic foot pain, right      Sable Feil, PA-C 09/16/14 1321  Joanne Gavel, MD 09/16/14 [phone]

## 2014-09-16 NOTE — ED Notes (Signed)
Reports right foot x 3 wks.  Ambulates with limp with minimal difficulty at triage.  Skin w/d

## 2014-09-16 NOTE — Discharge Instructions (Signed)
Follow up with schedule Ortho appointment. Continue previous medication.  Recommend 3 day BP check with [id number].mily Doctor due to elevate reading of

## 2014-09-16 NOTE — ED Notes (Signed)
Upon d/c pt states "let me guess he aint giving me shit for my pain, im about to slap him" Pt given prescription for tramadol and states "those dont do shit im throwing them out." pt walked out of room at this time.

## 2014-09-24 ENCOUNTER — Emergency Department: Payer: Medicare Other

## 2014-09-24 ENCOUNTER — Emergency Department
Admission: EM | Admit: 2014-09-24 | Discharge: 2014-09-25 | Disposition: A | Discharge status: Home / Self Care | Payer: Medicare Other | Attending: Emergency Medicine | Admitting: Emergency Medicine

## 2014-09-24 DIAGNOSIS — R079 Chest pain, unspecified: Secondary | ICD-10-CM

## 2014-09-24 DIAGNOSIS — Z880 Allergy status to penicillin: Secondary | ICD-10-CM | POA: Insufficient documentation

## 2014-09-24 DIAGNOSIS — M791 Myalgia, unspecified site: Secondary | ICD-10-CM

## 2014-09-24 DIAGNOSIS — R5383 Other fatigue: Secondary | ICD-10-CM | POA: Diagnosis present

## 2014-09-24 DIAGNOSIS — Z720 Tobacco use: Secondary | ICD-10-CM | POA: Insufficient documentation

## 2014-09-24 DIAGNOSIS — Z79899 Other long term (current) drug therapy: Secondary | ICD-10-CM | POA: Diagnosis not present

## 2014-09-24 DIAGNOSIS — Z79891 Long term (current) use of opiate analgesic: Secondary | ICD-10-CM | POA: Diagnosis not present

## 2014-09-24 DIAGNOSIS — B349 Viral infection, unspecified: Secondary | ICD-10-CM | POA: Insufficient documentation

## 2014-09-24 LAB — CBC WITH DIFFERENTIAL/PLATELET
Basophils Absolute: 0.1 10*3/uL (ref 0–0.1)
Basophils Relative: 1 %
Eosinophils Absolute: 0.2 10*3/uL (ref 0–0.7)
Eosinophils Relative: 2 %
HCT: 40 % (ref 35.0–47.0)
HEMOGLOBIN: 13.7 g/dL (ref 12.0–16.0)
LYMPHS ABS: 2.6 10*3/uL (ref 1.0–3.6)
Lymphocytes Relative: 30 %
MCH: 34.2 pg — AB (ref 26.0–34.0)
MCHC: 34.4 g/dL (ref 32.0–36.0)
MCV: 99.6 fL (ref 80.0–100.0)
MONOS PCT: 7 %
Monocytes Absolute: 0.6 10*3/uL (ref 0.2–0.9)
NEUTROS ABS: 5.4 10*3/uL (ref 1.4–6.5)
Neutrophils Relative %: 60 %
Platelets: 211 10*3/uL (ref 150–440)
RBC: 4.01 MIL/uL (ref 3.80–5.20)
RDW: 14 % (ref 11.5–14.5)
WBC: 9 10*3/uL (ref 3.6–11.0)

## 2014-09-24 LAB — COMPREHENSIVE METABOLIC PANEL
ALBUMIN: 3.3 g/dL — AB (ref 3.5–5.0)
ALK PHOS: 112 U/L (ref 38–126)
ALT: 94 U/L — AB (ref 14–54)
AST: 85 U/L — ABNORMAL HIGH (ref 15–41)
Anion gap: 7 (ref 5–15)
BUN: 14 mg/dL (ref 6–20)
CALCIUM: 8.5 mg/dL — AB (ref 8.9–10.3)
CO2: 27 mmol/L (ref 22–32)
CREATININE: 0.89 mg/dL (ref 0.44–1.00)
Chloride: 103 mmol/L (ref 101–111)
GFR calc non Af Amer: >60 mL/min (ref >60)
GLUCOSE: 123 mg/dL — AB (ref 65–99)
Potassium: 3.4 mmol/L — ABNORMAL LOW (ref 3.5–5.1)
Sodium: 137 mmol/L (ref 135–145)
TOTAL PROTEIN: 6.9 g/dL (ref 6.5–8.1)
Total Bilirubin: 0.5 mg/dL (ref 0.3–1.2)

## 2014-09-24 LAB — LIPASE, BLOOD: Lipase: 34 U/L (ref 22–51)

## 2014-09-24 LAB — URINALYSIS COMPLETE WITH MICROSCOPIC (ARMC ONLY)
BILIRUBIN URINE: NEGATIVE
Bacteria, UA: NONE SEEN
Glucose, UA: NEGATIVE mg/dL
HGB URINE DIPSTICK: NEGATIVE
Leukocytes, UA: NEGATIVE
Nitrite: NEGATIVE
PH: 7 (ref 5.0–8.0)
PROTEIN: NEGATIVE mg/dL
RBC / HPF: NONE SEEN RBC/hpf (ref 0–5)
Specific Gravity, Urine: 1.017 (ref 1.005–1.030)
WBC UA: NONE SEEN WBC/hpf (ref 0–5)

## 2014-09-24 LAB — TROPONIN I: Troponin I: <0.03 ng/mL (ref <0.031)

## 2014-09-24 MED ORDER — ALPRAZOLAM 0.5 MG PO TABS
1.0000 mg | ORAL_TABLET | Freq: Once | ORAL | Status: AC
  Administered 2014-09-24: 1 mg via ORAL
  Filled 2014-09-24: qty 2

## 2014-09-24 NOTE — Discharge Instructions (Signed)
Chest Pain (Nonspecific) °It is often hard to give a specific diagnosis for the cause of chest pain. There is always a chance that your pain could be related to something serious, such as a heart attack or a blood clot in the lungs. You need to follow up with your health care provider for further evaluation. °CAUSES  °· Heartburn. °· Pneumonia or bronchitis. °· Anxiety or stress. °· Inflammation around your heart (pericarditis) or lung (pleuritis or pleurisy). °· A blood clot in the lung. °· A collapsed lung (pneumothorax). It can develop suddenly on its own (spontaneous pneumothorax) or from trauma to the chest. °· Shingles infection (herpes zoster virus). °The chest wall is composed of bones, muscles, and cartilage. Any of these can be the source of the pain. °· The bones can be bruised by injury. °· The muscles or cartilage can be strained by coughing or overwork. °· The cartilage can be affected by inflammation and become sore (costochondritis). °DIAGNOSIS  °Lab tests or other studies may be needed to find the cause of your pain. Your health care provider may have you take a test called an ambulatory electrocardiogram (ECG). An ECG records your heartbeat patterns over a 24-hour period. You may also have other tests, such as: °· Transthoracic echocardiogram (TTE). During echocardiography, sound waves are used to evaluate how blood flows through your heart. °· Transesophageal echocardiogram (TEE). °· Cardiac monitoring. This allows your health care provider to monitor your heart rate and rhythm in real time. °· Holter monitor. This is a portable device that records your heartbeat and can help diagnose heart arrhythmias. It allows your health care provider to track your heart activity for several days, if needed. °· Stress tests by exercise or by giving medicine that makes the heart beat faster. °TREATMENT  °· Treatment depends on what may be causing your chest pain. Treatment may include: °· Acid blockers for  heartburn. °· Anti-inflammatory medicine. °· Pain medicine for inflammatory conditions. °· Antibiotics if an infection is present. °· You may be advised to change lifestyle habits. This includes stopping smoking and avoiding alcohol, caffeine, and chocolate. °· You may be advised to keep your head raised (elevated) when sleeping. This reduces the chance of acid going backward from your stomach into your esophagus. °Most of the time, nonspecific chest pain will improve within 2-3 days with rest and mild pain medicine.  °HOME CARE INSTRUCTIONS  °· If antibiotics were prescribed, take them as directed. Finish them even if you start to feel better. °· For the next few days, avoid physical activities that bring on chest pain. Continue physical activities as directed. °· Do not use any tobacco products, including cigarettes, chewing tobacco, or electronic cigarettes. °· Avoid drinking alcohol. °· Only take medicine as directed by your health care provider. °· Follow your health care provider's suggestions for further testing if your chest pain does not go away. °· Keep any follow-up appointments you made. If you do not go to an appointment, you could develop lasting (chronic) problems with pain. If there is any problem keeping an appointment, call to reschedule. °SEEK MEDICAL CARE IF:  °· Your chest pain does not go away, even after treatment. °· You have a rash with blisters on your chest. °· You have a fever. °SEEK IMMEDIATE MEDICAL CARE IF:  °· You have increased chest pain or pain that spreads to your arm, neck, jaw, back, or abdomen. °· You have shortness of breath. °· You have an increasing cough, or you cough   up blood.  You have severe back or abdominal pain.  You feel nauseous or vomit.  You have severe weakness.  You faint.  You have chills. This is an emergency. Do not wait to see if the pain will go away. Get medical help at once. Call your local emergency services (911 in [place] Do not drive  yourself to the hospital. MAKE SURE YOU:   Understand these instructions.  Will watch your condition.  Will get help right away if you are not doing well or get worse. Document Released: 09/28/2004 Document Revised: 12/24/2012 Document Reviewed: 07/25/2007 [facility] Patient Information 2015 [place], [place]. This information is not intended to replace advice given to you by your health care provider. Make sure you discuss any questions you have with your health care provider.  Viral Infections A virus is a type of germ. Viruses can cause:  Minor sore throats.  Aches and pains.  Headaches.  Runny nose.  Rashes.  Watery eyes.  Tiredness.  Coughs.  Loss of appetite.  Feeling sick to your stomach (nausea).  Throwing up (vomiting).  Watery poop (diarrhea). HOME CARE   Only take medicines as told by your doctor.  Drink enough water and fluids to keep your pee (urine) clear or pale yellow. Sports drinks are a good choice.  Get plenty of rest and eat healthy. Soups and broths with crackers or rice are fine. GET HELP RIGHT AWAY IF:   You have a very bad headache.  You have shortness of breath.  You have chest pain or neck pain.  You have an unusual rash.  You cannot stop throwing up.  You have watery poop that does not stop.  You cannot keep fluids down.  You or your child has a temperature by mouth above 102 F (38.9 C), not controlled by medicine.  Your baby is older than 3 months with a rectal temperature of 102 F (38.9 C) or higher.  Your baby is 43 months old or younger with a rectal temperature of 100.4 F (38 C) or higher. MAKE SURE YOU:   Understand these instructions.  Will watch this condition.  Will get help right away if you are not doing well or get worse. Document Released: 12/02/2007 Document Revised: 03/13/2011 Document Reviewed: 04/26/2010 [facility] Patient Information 2015 [place], [place]. This information is not intended to replace  advice given to you by your health care provider. Make sure you discuss any questions you have with your health care provider.

## 2014-09-24 NOTE — ED Provider Notes (Signed)
[facility] Emergency Department Provider Note  ____________________________________________  Time seen: Seen upon arrival to the emergency department  I have reviewed the triage vital signs and the nursing notes.   HISTORY  Chief Complaint Fatigue   HPI Janet Zuniga is a 59 y.o. female with a history of hepatitis C and breast cancer who is presenting today with fatigue and chest pain since 11 AM this morning. She is also complaining of body aches and a cough. She denies any sick contacts. No burning with urination. Says the chest pain is central and worsened with a cough. Says is a 10 out of 10 and has been this intensity since waking at 11 PM. It is not worsened with exertion. He is not worsened with deep breathing. Describes as a tightness.No radiation of the chest pain. No nausea or vomiting. Requesting her nightly Xanax dose. Says has been in remission from breast cancer for 6 years.   Past Medical History  Diagnosis Date  . Arthritis   . Blood in stool   . Hepatitis C 2007  . History of blood transfusion     Patient Active Problem List   Diagnosis Date Noted  . History of breast cancer in female 12/23/2013  . Chronic pain 12/23/2013  . Ankylosing spondylitis of lumbosacral region 12/23/2013  . Depression with anxiety 12/23/2013    Past Surgical History  Procedure Laterality Date  . Foot surgery  2000 and 2003  . Knee surgery Left 2004  . Breast lumpectomy Left 2010    Current Outpatient Rx  Name  Route  Sig  Dispense  Refill  . ALPRAZolam (XANAX) 1 MG tablet   Oral   Take 1 mg by mouth QID.         Marland Kitchen FLUoxetine (PROZAC) 40 MG capsule   Oral   Take 40 mg by mouth daily at 3 pm.         . Oxycodone HCl 10 MG TABS   Oral   Take 1 tablet (10 mg total) by mouth 3 (three) times daily.   12 tablet   0   . oxyCODONE-acetaminophen (ROXICET) 5-325 MG per tablet   Oral   Take 1 tablet by mouth every 4 (four) hours as needed for severe  pain.   20 tablet   0   . traMADol (ULTRAM) 50 MG tablet   Oral   Take 1 tablet (50 mg total) by mouth every 6 (six) hours as needed for moderate pain.   12 tablet   0   . traZODone (DESYREL) 150 MG tablet   Oral   Take by mouth at bedtime.           Allergies Penicillins  Family History  Problem Relation Age of Onset  . Arthritis Mother   . Stroke Mother   . Heart disease Mother   . Hypertension Mother   . Hypertension Father   . Hypertension Brother   . Heart disease Maternal Aunt     Social History Social History  Substance Use Topics  . Smoking status: Current Every Day Smoker  . Smokeless tobacco: Never Used  . Alcohol Use: Yes    Review of Systems Constitutional: No fever/chills Eyes: No visual changes. ENT: No sore throat. Cardiovascular: As above Respiratory: Denies shortness of breath. Gastrointestinal: No abdominal pain.  No nausea, no vomiting.  No diarrhea.  No constipation. Genitourinary: Negative for dysuria. Musculoskeletal: Diffuse body aches which are worse the proximal thighs. Does not take a  statin. Skin: Negative for rash. Neurological: Negative for headaches, focal weakness or numbness.  10-point ROS otherwise negative.  ____________________________________________   PHYSICAL EXAM:  VITAL SIGNS: ED Triage Vitals  Enc Vitals Group     BP 09/24/14 2145 123/81 mmHg     Pulse Rate 09/24/14 2145 72     Resp --      Temp 09/24/14 2145 97.7 F (36.5 C)     Temp Source 09/24/14 2145 Oral     SpO2 09/24/14 2145 96 %     Weight 09/24/14 2145 137 lb (62.143 kg)     Height 09/24/14 2145 5\' 1"  (1.549 m)     Head Cir --      Peak Flow --      Pain Score 09/24/14 2147 9     Pain Loc --      Pain Edu? --      Excl. in [place]? --     Constitutional: Alert and oriented. Well appearing and in no acute distress. Eyes: Conjunctivae are normal. PERRL. EOMI. Head: Atraumatic. Nose: No congestion/rhinnorhea. Mouth/Throat: Mucous membranes  are moist.  Oropharynx non-erythematous. Neck: No stridor.   Cardiovascular: Normal rate, regular rhythm. Grossly normal heart sounds.  Good peripheral circulation. Respiratory: Normal respiratory effort.  No retractions. Lungs CTAB. Gastrointestinal: Soft with mild tenderness to the upper abdomen. There is no rebound or guarding. The tenderness is maximal to the epigastrium. There is a negative Murphy sign.. No distention. No abdominal bruits. No CVA tenderness. Musculoskeletal: No lower extremity tenderness nor edema.  No joint effusions. Neurologic:  Normal speech and language. No gross focal neurologic deficits are appreciated. No gait instability. Skin:  Skin is warm, dry and intact. No rash noted. Psychiatric: Mood and affect are normal. Speech and behavior are normal.  ____________________________________________   LABS (all labs ordered are listed, but only abnormal results are displayed)  Labs Reviewed  CBC WITH DIFFERENTIAL/PLATELET - Abnormal; Notable for the following:    MCH 34.2 (*)    All other components within normal limits  COMPREHENSIVE METABOLIC PANEL - Abnormal; Notable for the following:    Potassium 3.4 (*)    Glucose, Bld 123 (*)    Calcium 8.5 (*)    Albumin 3.3 (*)    AST 85 (*)    ALT 94 (*)    All other components within normal limits  LIPASE, BLOOD  TROPONIN I  URINALYSIS COMPLETEWITH MICROSCOPIC ([facility] ONLY)   ____________________________________________  EKG  ED ECG REPORT I, Doran Stabler, the attending physician, personally viewed and interpreted this ECG.   Date: 09/24/2014  EKG Time: 2205  Rate: 69  Rhythm: normal sinus rhythm  Axis: Normal axis  Intervals:none  ST&T Change: T wave inversions in V2 and V3. No ST elevations or depressions. T-wave inversions are identical and unchanged to that seen on EKG from 09/01/2011.  ____________________________________________  RADIOLOGY  Mild decreased interstitial markings. May be  related to viral infection. No focal acute pulmonary abnormality. ____________________________________________   PROCEDURES    ____________________________________________   INITIAL IMPRESSION / ASSESSMENT AND PLAN / ED COURSE  Pertinent labs & imaging results that were available during my care of the patient were reviewed by me and considered in my medical decision making (see chart for details).  ----------------------------------------- 11:26 PM on 09/24/2014 -----------------------------------------  Patient's urine results still pending. However, resting comfortable at this time. Likely viral etiology as indicated by the chest x-ray. This would explain her body aches as well as weakness. Chest  pain likely musculoskeletal. Pain ongoing for greater than 8 hours without any EKG change or elevated troponin. Also patient ibuprofen for pain relief but refused. Do not suspect pulmonary embolus and very low risk for ACS given no EKG changes and reassuring lab work.    ____________________________________________   FINAL CLINICAL IMPRESSION(S) / ED DIAGNOSES  Acute chest pain and acute myalgias. Acute viral illness.    Orbie Pyo, MD 09/24/14 [phone]

## 2014-09-24 NOTE — ED Notes (Addendum)
Pt arrives to ED via [facility] with c/o generalized weakness and "not feeling well". Per EMS, pt reports feeling weak since waking up this morning. Pt denies any c/o N/V/D, does states she has some chest "tightness". MD at bedside upon pt arrival, pt immediately began stating she hasn't "taken her meds today and needs something for pain". Pt is A&O, in NAD, respirations even and unlabored.

## 2014-10-20 DIAGNOSIS — G8929 Other chronic pain: Secondary | ICD-10-CM | POA: Insufficient documentation

## 2014-10-20 DIAGNOSIS — J209 Acute bronchitis, unspecified: Secondary | ICD-10-CM | POA: Insufficient documentation

## 2014-10-20 DIAGNOSIS — M79671 Pain in right foot: Secondary | ICD-10-CM

## 2014-10-26 ENCOUNTER — Encounter: Payer: Self-pay | Admitting: *Deleted

## 2014-10-26 ENCOUNTER — Emergency Department
Admission: EM | Admit: 2014-10-26 | Discharge: 2014-10-27 | Disposition: A | Discharge status: Other Acute Inpatient Hospital | Payer: Medicare Other | Attending: Emergency Medicine | Admitting: Emergency Medicine

## 2014-10-26 DIAGNOSIS — F10129 Alcohol abuse with intoxication, unspecified: Secondary | ICD-10-CM | POA: Insufficient documentation

## 2014-10-26 DIAGNOSIS — Z880 Allergy status to penicillin: Secondary | ICD-10-CM | POA: Insufficient documentation

## 2014-10-26 DIAGNOSIS — R45851 Suicidal ideations: Secondary | ICD-10-CM | POA: Insufficient documentation

## 2014-10-26 DIAGNOSIS — Z792 Long term (current) use of antibiotics: Secondary | ICD-10-CM | POA: Insufficient documentation

## 2014-10-26 DIAGNOSIS — F331 Major depressive disorder, recurrent, moderate: Secondary | ICD-10-CM | POA: Diagnosis not present

## 2014-10-26 DIAGNOSIS — Z79899 Other long term (current) drug therapy: Secondary | ICD-10-CM | POA: Diagnosis not present

## 2014-10-26 DIAGNOSIS — F1010 Alcohol abuse, uncomplicated: Secondary | ICD-10-CM

## 2014-10-26 DIAGNOSIS — F919 Conduct disorder, unspecified: Secondary | ICD-10-CM | POA: Diagnosis present

## 2014-10-26 DIAGNOSIS — F329 Major depressive disorder, single episode, unspecified: Secondary | ICD-10-CM | POA: Diagnosis not present

## 2014-10-26 DIAGNOSIS — F1310 Sedative, hypnotic or anxiolytic abuse, uncomplicated: Secondary | ICD-10-CM | POA: Diagnosis not present

## 2014-10-26 DIAGNOSIS — F32A Depression, unspecified: Secondary | ICD-10-CM

## 2014-10-26 DIAGNOSIS — F1994 Other psychoactive substance use, unspecified with psychoactive substance-induced mood disorder: Secondary | ICD-10-CM

## 2014-10-26 DIAGNOSIS — G8929 Other chronic pain: Secondary | ICD-10-CM | POA: Diagnosis present

## 2014-10-26 LAB — URINE DRUG SCREEN, QUALITATIVE (ARMC ONLY)
Amphetamines, Ur Screen: NOT DETECTED
BARBITURATES, UR SCREEN: NOT DETECTED
Benzodiazepine, Ur Scrn: POSITIVE — AB
CANNABINOID 50 NG, UR ~~LOC~~: NOT DETECTED
COCAINE METABOLITE, UR ~~LOC~~: NOT DETECTED
MDMA (Ecstasy)Ur Screen: NOT DETECTED
Methadone Scn, Ur: NOT DETECTED
OPIATE, UR SCREEN: NOT DETECTED
Phencyclidine (PCP) Ur S: NOT DETECTED
TRICYCLIC, UR SCREEN: NOT DETECTED

## 2014-10-26 LAB — CBC
HEMATOCRIT: 41.1 % (ref 35.0–47.0)
Hemoglobin: 13.8 g/dL (ref 12.0–16.0)
MCH: 33.2 pg (ref 26.0–34.0)
MCHC: 33.7 g/dL (ref 32.0–36.0)
MCV: 98.5 fL (ref 80.0–100.0)
PLATELETS: 233 10*3/uL (ref 150–440)
RBC: 4.17 MIL/uL (ref 3.80–5.20)
RDW: 13.8 % (ref 11.5–14.5)
WBC: 7.4 10*3/uL (ref 3.6–11.0)

## 2014-10-26 LAB — ETHANOL: Alcohol, Ethyl (B): 138 mg/dL — ABNORMAL HIGH (ref <5)

## 2014-10-26 LAB — COMPREHENSIVE METABOLIC PANEL
ALT: 67 U/L — ABNORMAL HIGH (ref 14–54)
AST: 76 U/L — AB (ref 15–41)
Albumin: 3.6 g/dL (ref 3.5–5.0)
Alkaline Phosphatase: 107 U/L (ref 38–126)
Anion gap: 8 (ref 5–15)
BUN: 9 mg/dL (ref 6–20)
CHLORIDE: 107 mmol/L (ref 101–111)
CO2: 25 mmol/L (ref 22–32)
Calcium: 9.2 mg/dL (ref 8.9–10.3)
Creatinine, Ser: 0.52 mg/dL (ref 0.44–1.00)
GFR calc Af Amer: >60 mL/min (ref >60)
GFR calc non Af Amer: >60 mL/min (ref >60)
Glucose, Bld: 108 mg/dL — ABNORMAL HIGH (ref 65–99)
POTASSIUM: 3 mmol/L — AB (ref 3.5–5.1)
SODIUM: 140 mmol/L (ref 135–145)
Total Bilirubin: 0.7 mg/dL (ref 0.3–1.2)
Total Protein: 7.9 g/dL (ref 6.5–8.1)

## 2014-10-26 LAB — ACETAMINOPHEN LEVEL: Acetaminophen (Tylenol), Serum: <10 ug/mL — ABNORMAL LOW (ref 10–30)

## 2014-10-26 LAB — SALICYLATE LEVEL: Salicylate Lvl: <4 mg/dL (ref 2.8–30.0)

## 2014-10-26 MED ORDER — TRAZODONE HCL 50 MG PO TABS
150.0000 mg | ORAL_TABLET | Freq: Once | ORAL | Status: AC
  Administered 2014-10-26: 150 mg via ORAL

## 2014-10-26 MED ORDER — TRAZODONE HCL 100 MG PO TABS
ORAL_TABLET | ORAL | Status: AC
  Filled 2014-10-26: qty 1

## 2014-10-26 MED ORDER — TRAZODONE HCL 50 MG PO TABS
ORAL_TABLET | ORAL | Status: AC
  Filled 2014-10-26: qty 1

## 2014-10-26 NOTE — ED Provider Notes (Signed)
[facility] Emergency Department Provider Note  Time seen: 11:01 PM  I have reviewed the triage vital signs and the nursing notes.   HISTORY  Chief Complaint Behavior Problem    HPI KAMEO BAINS is a 59 y.o. female with a past medical history of arthritis, hepatitis, alcohol abuse, anxiety, presents the emergency department with suicidal ideation and alcohol intoxication. According to the patient she has been drinking heavily tonight. She states her son will not allow her to see her grandchildren, and tonight she wants to kill her self. Denies any specific plan. Denies any medical complaints.    Past Medical History  Diagnosis Date  . Arthritis   . Blood in stool   . Hepatitis C 2007  . History of blood transfusion     Patient Active Problem List   Diagnosis Date Noted  . History of breast cancer in female 12/23/2013  . Chronic pain 12/23/2013  . Ankylosing spondylitis of lumbosacral region ([place]) 12/23/2013  . Depression with anxiety 12/23/2013    Past Surgical History  Procedure Laterality Date  . Foot surgery  2000 and 2003  . Knee surgery Left 2004  . Breast lumpectomy Left 2010    Current Outpatient Rx  Name  Route  Sig  Dispense  Refill  . ALPRAZolam (XANAX) 1 MG tablet   Oral   Take 1 mg by mouth QID.         Marland Kitchen FLUoxetine (PROZAC) 40 MG capsule   Oral   Take 40 mg by mouth daily at 3 pm.         . Oxycodone HCl 10 MG TABS   Oral   Take 1 tablet (10 mg total) by mouth 3 (three) times daily.   12 tablet   0   . oxyCODONE-acetaminophen (ROXICET) 5-325 MG per tablet   Oral   Take 1 tablet by mouth every 4 (four) hours as needed for severe pain.   20 tablet   0   . traMADol (ULTRAM) 50 MG tablet   Oral   Take 1 tablet (50 mg total) by mouth every 6 (six) hours as needed for moderate pain.   12 tablet   0   . traZODone (DESYREL) 150 MG tablet   Oral   Take by mouth at bedtime.            Allergies Penicillins  Family History  Problem Relation Age of Onset  . Arthritis Mother   . Stroke Mother   . Heart disease Mother   . Hypertension Mother   . Hypertension Father   . Hypertension Brother   . Heart disease Maternal Aunt     Social History Social History  Substance Use Topics  . Smoking status: Current Every Day Smoker  . Smokeless tobacco: Never Used  . Alcohol Use: Yes    Review of Systems Constitutional: Negative for fever. Cardiovascular: Negative for chest pain. Respiratory: Negative for shortness of breath. Gastrointestinal: Negative for abdominal pain Musculoskeletal: Negative for back pain Neurological: Negative for headache 10-point ROS otherwise negative.  ____________________________________________   PHYSICAL EXAM:  VITAL SIGNS: ED Triage Vitals  Enc Vitals Group     BP 10/26/14 2159 118/90 mmHg     Pulse Rate 10/26/14 2159 82     Resp 10/26/14 2159 20     Temp 10/26/14 2159 98.2 F (36.8 C)     Temp Source 10/26/14 2159 Oral     SpO2 10/26/14 2159 96 %  Weight 10/26/14 2159 145 lb (65.772 kg)     Height 10/26/14 2159 5\' 1"  (1.549 m)     Head Cir --      Peak Flow --      Pain Score 10/26/14 2200 10     Pain Loc --      Pain Edu? --      Excl. in [place]? --    Constitutional: Alert and oriented. Well appearing and in no distress. Eyes: Normal exam ENT   Head: Normocephalic and atraumatic.Marland Kitchen   Mouth/Throat: Mucous membranes are moist. Cardiovascular: Normal rate, regular rhythm. No murmur Respiratory: Normal respiratory effort without tachypnea nor retractions. Breath sounds are clear  Gastrointestinal: Soft and nontender. No distention. Musculoskeletal: Nontender with normal range of motion in all extremities.  Neurologic:  Normal speech and language. No gross focal neurologic deficits  Psychiatric: Admits suicidal ideation in the emergency department. Admits alcohol  intoxication.  ____________________________________________     INITIAL IMPRESSION / ASSESSMENT AND PLAN / ED COURSE  Pertinent labs & imaging results that were available during my care of the patient were reviewed by me and considered in my medical decision making (see chart for details).  We will place the patient under an involuntary commitment given her alcohol intoxication and current suicidal ideation. Patient denies any medical complaints. We'll check labs and have psychiatry evaluate.  Ethanol level CXXXVIII, labs otherwise largely within normal limits besides elevated LFTs, likely at baseline. Acetaminophen negative.  ____________________________________________   FINAL CLINICAL IMPRESSION(S) / ED DIAGNOSES  Depression Suicidal ideation  alcohol intoxication   Harvest Dark, MD 10/26/14 2304

## 2014-10-26 NOTE — ED Notes (Addendum)
Pt brought in by [place] police.  Pt reports SI.  Denies HI. Pt reports etoh and drug use.

## 2014-10-27 DIAGNOSIS — F329 Major depressive disorder, single episode, unspecified: Secondary | ICD-10-CM | POA: Diagnosis not present

## 2014-10-27 DIAGNOSIS — F331 Major depressive disorder, recurrent, moderate: Secondary | ICD-10-CM | POA: Diagnosis not present

## 2014-10-27 DIAGNOSIS — F1994 Other psychoactive substance use, unspecified with psychoactive substance-induced mood disorder: Secondary | ICD-10-CM

## 2014-10-27 MED ORDER — OXYCODONE HCL 5 MG PO TABS
ORAL_TABLET | ORAL | Status: AC
  Administered 2014-10-27: 5 mg via ORAL
  Filled 2014-10-27: qty 1

## 2014-10-27 MED ORDER — ALPRAZOLAM 0.5 MG PO TABS
1.0000 mg | ORAL_TABLET | ORAL | Status: AC
  Administered 2014-10-27: 1 mg via ORAL

## 2014-10-27 MED ORDER — FLUOXETINE HCL 20 MG PO CAPS
ORAL_CAPSULE | ORAL | Status: AC
Start: 2014-10-27 — End: 2014-10-27
  Administered 2014-10-27: 40 mg via ORAL
  Filled 2014-10-27: qty 2

## 2014-10-27 MED ORDER — ALPRAZOLAM 0.5 MG PO TABS
ORAL_TABLET | ORAL | Status: AC
  Administered 2014-10-27: 1 mg via ORAL
  Filled 2014-10-27: qty 2

## 2014-10-27 MED ORDER — FLUOXETINE HCL 20 MG PO CAPS
40.0000 mg | ORAL_CAPSULE | ORAL | Status: AC
  Administered 2014-10-27: 40 mg via ORAL

## 2014-10-27 MED ORDER — OXYCODONE HCL 5 MG PO TABS
5.0000 mg | ORAL_TABLET | ORAL | Status: AC
  Administered 2014-10-27: 5 mg via ORAL

## 2014-10-27 NOTE — ED Provider Notes (Signed)
-----------------------------------------   3:58 PM on 10/27/2014 -----------------------------------------  Dr. Carlena Hurl has evaluated the patient and rescinded IVC. He recommends discharge with PCP follow-up as well as [facility] follow-up. We'll discharge with return precautions and close PCP follow-up.  Joanne Gavel, MD 10/27/14 [phone]

## 2014-10-27 NOTE — ED Notes (Signed)
Report received from [facility], RN. Pt. Alert and oriented in no distress has SI; denies HI, AVH and pain.  Pt. Instructed to come to me with problems or concerns.Will continue to monitor for safety via security cameras and Q 15 minute checks.
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
## 2014-10-27 NOTE — ED Notes (Signed)
Patient anticipating discharge today. No specific complaints. Maintained on 15 minute checks and observation by security camera for safety.

## 2014-10-27 NOTE — ED Notes (Signed)
Patient asleep in room. No noted distress or abnormal behavior. Will continue 15 minute checks and observation by security cameras for safety. 

## 2014-10-27 NOTE — ED Notes (Signed)
Pt. Noted in room resting quietly;. No complaints or concerns voiced. No distress or abnormal behavior noted. Will continue to monitor with security cameras. Q 15 minute rounds continue. 

## 2014-10-27 NOTE — ED Notes (Signed)
Meal given
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
## 2014-10-27 NOTE — Progress Notes (Signed)
After consult with Dr. Weber Cooks the plan is to discharge the pt home with outpatient referrals.   10/27/2014 Con Memos, MS, [place], [facility] Therapeutic Triage Specialist

## 2014-10-27 NOTE — ED Notes (Signed)
Patient resting quietly in room. No noted distress or abnormal behaviors noted. Will continue 15 minute checks and observation by security camera for safety. 

## 2014-10-27 NOTE — ED Notes (Signed)
Meal given. Maintained on 15 minute checks and observation by security camera for safety.

## 2014-10-27 NOTE — ED Provider Notes (Signed)
-----------------------------------------   7:21 AM on 10/27/2014 -----------------------------------------   Blood pressure 118/90, pulse 82, temperature 98.2 F (36.8 C), temperature source Oral, resp. rate 20, height 5\' 1"  (1.549 m), weight 145 lb (65.772 kg), SpO2 96 %.  The patient had no acute events since last update.  Calm and cooperative at this time.  Disposition is pending per Psychiatry/Behavioral Medicine team recommendations.     Loney Hering, MD 10/27/14 [id number]

## 2014-10-27 NOTE — ED Notes (Signed)
Pt. To EDBHU from ED ambulatory without difficulty, to room BHU-5  . Report from RN. Pt. Is alert and oriented, warm and dry in no distress. Pt. Denies SI, HI, and AVH. Pt. Calm and cooperative. Pt. Made aware of security cameras and Q15 minute rounds. Pt. Encouraged to let Nursing staff know of any concerns or needs.

## 2014-10-27 NOTE — ED Notes (Signed)

## 2014-10-27 NOTE — Progress Notes (Signed)
The plan is to admit pt to [facility] per Dr. Dwyane Dee. If there are no available beds for this pt Dr. Is willing to admit to [facility].     10/27/2014 Con Memos, MS, [place], [facility] Therapeutic Triage Specialist

## 2014-10-27 NOTE — ED Notes (Signed)
Patient alert and oriented.  She denies SI. No s/s ETOH withdrawal. Maintained on 15 minute checks and observation by security camera for safety.

## 2014-10-27 NOTE — ED Notes (Signed)
Patient discharged ambulatory to home, accompanied by family. She denies SI or HI. Discharge instructions reviewed with patient, she verbalizes understanding. Patient received copy of DC plan and all personal belongings.

## 2014-11-05 ENCOUNTER — Other Ambulatory Visit: Payer: Self-pay

## 2014-11-05 ENCOUNTER — Ambulatory Visit: Payer: Self-pay | Admitting: Oncology

## 2014-11-06 ENCOUNTER — Other Ambulatory Visit: Payer: Self-pay | Admitting: *Deleted

## 2014-11-06 DIAGNOSIS — Z853 Personal history of malignant neoplasm of breast: Secondary | ICD-10-CM

## 2014-11-11 ENCOUNTER — Inpatient Hospital Stay: Payer: Medicare Other

## 2014-11-11 ENCOUNTER — Inpatient Hospital Stay: Payer: Medicare Other | Admitting: Oncology

## 2014-11-17 ENCOUNTER — Inpatient Hospital Stay: Payer: Medicare Other

## 2014-11-17 ENCOUNTER — Ambulatory Visit: Payer: Self-pay | Admitting: Oncology

## 2014-11-17 ENCOUNTER — Other Ambulatory Visit: Payer: Self-pay

## 2014-11-17 ENCOUNTER — Inpatient Hospital Stay: Payer: Medicare Other | Admitting: Oncology

## 2014-12-16 ENCOUNTER — Other Ambulatory Visit: Payer: Medicare Other

## 2014-12-16 ENCOUNTER — Ambulatory Visit: Payer: Medicare Other | Admitting: Oncology

## 2014-12-21 ENCOUNTER — Emergency Department
Admission: EM | Admit: 2014-12-21 | Discharge: 2014-12-21 | Disposition: A | Discharge status: Home / Self Care | Payer: Medicare Other | Attending: Emergency Medicine | Admitting: Emergency Medicine

## 2014-12-21 ENCOUNTER — Encounter: Payer: Self-pay | Admitting: *Deleted

## 2014-12-21 DIAGNOSIS — M79671 Pain in right foot: Secondary | ICD-10-CM

## 2014-12-21 DIAGNOSIS — F17210 Nicotine dependence, cigarettes, uncomplicated: Secondary | ICD-10-CM | POA: Diagnosis not present

## 2014-12-21 DIAGNOSIS — G8929 Other chronic pain: Secondary | ICD-10-CM | POA: Diagnosis not present

## 2014-12-21 DIAGNOSIS — Z880 Allergy status to penicillin: Secondary | ICD-10-CM | POA: Diagnosis not present

## 2014-12-21 DIAGNOSIS — Z79899 Other long term (current) drug therapy: Secondary | ICD-10-CM | POA: Diagnosis not present

## 2014-12-21 MED ORDER — OXYCODONE HCL 5 MG PO TABS
10.0000 mg | ORAL_TABLET | Freq: Once | ORAL | Status: AC
  Administered 2014-12-21: 10 mg via ORAL
  Filled 2014-12-21: qty 2

## 2014-12-21 NOTE — ED Provider Notes (Signed)
[facility] Emergency Department Provider Note  ____________________________________________  Time seen: Approximately 12:16 PM  I have reviewed the triage vital signs and the nursing notes.   HISTORY  Chief Complaint Foot Pain    HPI Janet Zuniga is a 59 y.o. female who presents emergency department complaining of right foot pain. She states that she has had a six-month history of same. She states that she has had surgery on the tendons in her foot multiple years ago. She denies any injury leading to this foot pain. She states that the pain is sharp, constant, unrelieved by her medications.   Past Medical History  Diagnosis Date  . Arthritis   . Blood in stool   . Hepatitis C 2007  . History of blood transfusion     Patient Active Problem List   Diagnosis Date Noted  . Major depression ([place]) 10/27/2014  . Substance induced mood disorder ([place]) 10/27/2014  . History of breast cancer in female 12/23/2013  . Chronic pain 12/23/2013  . Ankylosing spondylitis of lumbosacral region ([place]) 12/23/2013  . Depression with anxiety 12/23/2013    Past Surgical History  Procedure Laterality Date  . Foot surgery  2000 and 2003  . Knee surgery Left 2004  . Breast lumpectomy Left 2010    Current Outpatient Rx  Name  Route  Sig  Dispense  Refill  . ALPRAZolam (XANAX) 1 MG tablet   Oral   Take 1 mg by mouth 4 (four) times daily as needed for anxiety.          . benzonatate (TESSALON) 100 MG capsule   Oral   Take 100 mg by mouth 3 (three) times daily as needed for cough.          . busPIRone (BUSPAR) 5 MG tablet   Oral   Take 5 mg by mouth 3 (three) times daily.         Marland Kitchen FLUoxetine (PROZAC) 40 MG capsule   Oral   Take 40 mg by mouth daily.          . Oxycodone HCl 10 MG TABS   Oral   Take 1 tablet (10 mg total) by mouth 3 (three) times daily. Patient not taking: Reported on 10/27/2014   12 tablet   0   . oxyCODONE-acetaminophen  (ROXICET) 5-325 MG per tablet   Oral   Take 1 tablet by mouth every 4 (four) hours as needed for severe pain. Patient taking differently: Take 1 tablet by mouth every 8 (eight) hours as needed for moderate pain or severe pain.    20 tablet   0   . traMADol (ULTRAM) 50 MG tablet   Oral   Take 1 tablet (50 mg total) by mouth every 6 (six) hours as needed for moderate pain. Patient not taking: Reported on 10/27/2014   12 tablet   0   . traZODone (DESYREL) 150 MG tablet   Oral   Take 150 mg by mouth at bedtime.            Allergies Penicillins  Family History  Problem Relation Age of Onset  . Arthritis Mother   . Stroke Mother   . Heart disease Mother   . Hypertension Mother   . Hypertension Father   . Hypertension Brother   . Heart disease Maternal Aunt     Social History Social History  Substance Use Topics  . Smoking status: Current Every Day Smoker -- 1.00 packs/day    Types: Cigarettes  .  Smokeless tobacco: Never Used  . Alcohol Use: Yes    Review of Systems Constitutional: No fever/chills Eyes: No visual changes. ENT: No sore throat. Cardiovascular: Denies chest pain. Respiratory: Denies shortness of breath. Gastrointestinal: No abdominal pain.  No nausea, no vomiting.  No diarrhea.  No constipation. Genitourinary: Negative for dysuria. Musculoskeletal: Negative for back pain. Endorses right foot pain. Skin: Negative for rash. Neurological: Negative for headaches, focal weakness or numbness.  10-point ROS otherwise negative.  ____________________________________________   PHYSICAL EXAM:  VITAL SIGNS: ED Triage Vitals  Enc Vitals Group     BP 12/21/14 1206 122/82 mmHg     Pulse Rate 12/21/14 1206 77     Resp 12/21/14 1206 2     Temp 12/21/14 1206 97.8 F (36.6 C)     Temp src --      SpO2 12/21/14 1206 99 %     Weight 12/21/14 1206 150 lb (68.04 kg)     Height 12/21/14 1206 5\' 1"  (1.549 m)     Head Cir --      Peak Flow --      Pain Score  12/21/14 1207 4     Pain Loc --      Pain Edu? --      Excl. in [place]? --     Constitutional: Alert and oriented. Well appearing and in no acute distress. Eyes: Conjunctivae are normal. PERRL. EOMI. Head: Atraumatic. Nose: No congestion/rhinnorhea. Mouth/Throat: Mucous membranes are moist.  Oropharynx non-erythematous. Neck: No stridor.   Cardiovascular: Normal rate, regular rhythm. Grossly normal heart sounds.  Good peripheral circulation. Respiratory: Normal respiratory effort.  No retractions. Lungs CTAB. Gastrointestinal: Soft and nontender. No distention. No abdominal bruits. No CVA tenderness. Musculoskeletal: No lower extremity tenderness nor edema.  No joint effusions. No visible abnormality to right foot when compared with left. Full range of motion of foot. The patient is non-tender to palpation. Pulses and sensation are intact. Neurologic:  Normal speech and language. No gross focal neurologic deficits are appreciated. No gait instability. Skin:  Skin is warm, dry and intact. No rash noted. Psychiatric: Mood and affect are normal. Speech and behavior are normal.  ____________________________________________   LABS (all labs ordered are listed, but only abnormal results are displayed)  Labs Reviewed - No data to display ____________________________________________  EKG   ____________________________________________  RADIOLOGY   ____________________________________________   PROCEDURES  Procedure(s) performed: None  Critical Care performed: No  ____________________________________________   INITIAL IMPRESSION / ASSESSMENT AND PLAN / ED COURSE  Pertinent labs & imaging results that were available during my care of the patient were reviewed by me and considered in my medical decision making (see chart for details).  Patient's diagnosis is right foot pain. Patient is advised that she is to follow-up with orthopedics for further evaluation. There is no acute  findings at this time. Patient requested pain medication without Tylenol due to liver issue. After this was prescribed turned emergency department patient started requesting pain medication for home use. At that time patient was queried in the [place] controlled substance database and it was discovered patient has received 180 pills of different narcotic prescriptions in the last month. When confronted with this the patient denied using these narcotics. Patient was shown in the system that she has picked up same at which time patient admitted to use. She requested that this provider fill oxycodone for her. I informed patient I would not refill any narcotic prescriptions for her. If she still was requesting  narcotic she may follow up with her primary care. Patient was offered anti-inflammatories until she could see orthopedics but she is declining at this time. ____________________________________________   FINAL CLINICAL IMPRESSION(S) / ED DIAGNOSES  Final diagnoses:  Right foot pain  Chronic pain      Darletta Moll, PA-C 12/21/14 1234  Orbie Pyo, MD 12/21/14 [phone]

## 2014-12-21 NOTE — Discharge Instructions (Signed)
Chronic Pain  Chronic pain can be defined as pain that is off and on and lasts for 3-6 months or longer. Many things cause chronic pain, which can make it difficult to make a diagnosis. There are many treatment options available for chronic pain. However, finding a treatment that works well for you may require trying various approaches until the right one is found. Many people benefit from a combination of two or more types of treatment to control their pain.  SYMPTOMS   Chronic pain can occur anywhere in the body and can range from mild to very severe. Some types of chronic pain include:  · Headache.  · Low back pain.  · Cancer pain.  · Arthritis pain.  · Neurogenic pain. This is pain resulting from damage to nerves.   People with chronic pain may also have other symptoms such as:  · Depression.  · Anger.  · Insomnia.  · Anxiety.  DIAGNOSIS   Your health care provider will help diagnose your condition over time. In many cases, the initial focus will be on excluding possible conditions that could be causing the pain. Depending on your symptoms, your health care provider may order tests to diagnose your condition. Some of these tests may include:   · Blood tests.    · CT scan.    · MRI.    · X-rays.    · Ultrasounds.    · Nerve conduction studies.    You may need to see a specialist.   TREATMENT   Finding treatment that works well may take time. You may be referred to a pain specialist. He or she may prescribe medicine or therapies, such as:   · Mindful meditation or yoga.  · Shots (injections) of numbing or pain-relieving medicines into the spine or area of pain.  · Local electrical stimulation.  · Acupuncture.    · Massage therapy.    · Aroma, color, light, or sound therapy.    · Biofeedback.    · Working with a physical therapist to keep from getting stiff.    · Regular, gentle exercise.    · Cognitive or behavioral therapy.    · Group support.    Sometimes, surgery may be recommended.   HOME CARE INSTRUCTIONS    · Take all medicines as directed by your health care provider.    · Lessen stress in your life by relaxing and doing things such as listening to calming music.    · Exercise or be active as directed by your health care provider.    · Eat a healthy diet and include things such as vegetables, fruits, fish, and lean meats in your diet.    · Keep all follow-up appointments with your health care provider.    · Attend a support group with others suffering from chronic pain.  SEEK MEDICAL CARE IF:   · Your pain gets worse.    · You develop a new pain that was not there before.    · You cannot tolerate medicines given to you by your health care provider.    · You have new symptoms since your last visit with your health care provider.    SEEK IMMEDIATE MEDICAL CARE IF:   · You feel weak.    · You have decreased sensation or numbness.    · You lose control of bowel or bladder function.    · Your pain suddenly gets much worse.    · You develop shaking.  · You develop chills.  · You develop confusion.  · You develop chest pain.  · You develop shortness of breath.    MAKE SURE YOU:  ·   Document Revised: 08/21/2012 Document Reviewed: 06/14/2012 Elsevier Interactive Patient Education 2016 Elsevier Inc.  Musculoskeletal Pain Musculoskeletal pain is muscle and boney aches and pains. These pains can occur in any part of the body. Your caregiver may treat you without knowing the cause of the pain. They may treat you if blood or urine tests, X-rays, and other tests were normal.  CAUSES There is often not a definite cause or reason for these pains. These pains may be caused by a type of germ  (virus). The discomfort may also come from overuse. Overuse includes working out too hard when your body is not fit. Boney aches also come from weather changes. Bone is sensitive to atmospheric pressure changes. HOME CARE INSTRUCTIONS   Ask when your test results will be ready. Make sure you get your test results.  Only take over-the-counter or prescription medicines for pain, discomfort, or fever as directed by your caregiver. If you were given medications for your condition, do not drive, operate machinery or power tools, or sign legal documents for 24 hours. Do not drink alcohol. Do not take sleeping pills or other medications that may interfere with treatment.  Continue all activities unless the activities cause more pain. When the pain lessens, slowly resume normal activities. Gradually increase the intensity and duration of the activities or exercise.  During periods of severe pain, bed rest may be helpful. Lay or sit in any position that is comfortable.  Putting ice on the injured area.  Put ice in a bag.  Place a towel between your skin and the bag.  Leave the ice on for 15 to 20 minutes, 3 to 4 times a day.  Follow up with your caregiver for continued problems and no reason can be found for the pain. If the pain becomes worse or does not go away, it may be necessary to repeat tests or do additional testing. Your caregiver may need to look further for a possible cause. SEEK IMMEDIATE MEDICAL CARE IF:  You have pain that is getting worse and is not relieved by medications.  You develop chest pain that is associated with shortness or breath, sweating, feeling sick to your stomach (nauseous), or throw up (vomit).  Your pain becomes localized to the abdomen.  You develop any new symptoms that seem different or that concern you. MAKE SURE YOU:   Understand these instructions.  Will watch your condition.  Will get help right away if you are not doing well or get worse.   This  information is not intended to replace advice given to you by your health care provider. Make sure you discuss any questions you have with your health care provider.   Document Released: 12/19/2004 Document Revised: 03/13/2011 Document Reviewed: 08/23/2012 Elsevier Interactive Patient Education [employer].

## 2014-12-21 NOTE — ED Notes (Signed)
States she developed pain to right foot about 6 months ago  denies any injury  But states pain is to top and lateral aspects of foot.

## 2014-12-21 NOTE — ED Notes (Signed)
Pt complains of right foot pain for the last 6 months

## 2014-12-24 DIAGNOSIS — F32A Depression, unspecified: Secondary | ICD-10-CM | POA: Insufficient documentation

## 2014-12-24 DIAGNOSIS — M5441 Lumbago with sciatica, right side: Secondary | ICD-10-CM

## 2014-12-24 DIAGNOSIS — M5442 Lumbago with sciatica, left side: Secondary | ICD-10-CM

## 2014-12-24 DIAGNOSIS — F329 Major depressive disorder, single episode, unspecified: Secondary | ICD-10-CM | POA: Insufficient documentation

## 2014-12-24 DIAGNOSIS — G8929 Other chronic pain: Secondary | ICD-10-CM | POA: Insufficient documentation

## 2014-12-24 DIAGNOSIS — F419 Anxiety disorder, unspecified: Secondary | ICD-10-CM | POA: Insufficient documentation

## 2015-01-07 ENCOUNTER — Inpatient Hospital Stay: Payer: Medicare Other | Attending: Oncology

## 2015-01-07 ENCOUNTER — Encounter: Payer: Self-pay | Admitting: Oncology

## 2015-01-07 ENCOUNTER — Inpatient Hospital Stay (HOSPITAL_BASED_OUTPATIENT_CLINIC_OR_DEPARTMENT_OTHER): Payer: Medicare Other | Admitting: Oncology

## 2015-01-07 VITALS — BP 138/92 | HR 67 | Temp 98.7°F | Resp 20 | Wt 148.8 lb

## 2015-01-07 DIAGNOSIS — M549 Dorsalgia, unspecified: Secondary | ICD-10-CM | POA: Diagnosis not present

## 2015-01-07 DIAGNOSIS — Z79899 Other long term (current) drug therapy: Secondary | ICD-10-CM | POA: Insufficient documentation

## 2015-01-07 DIAGNOSIS — N644 Mastodynia: Secondary | ICD-10-CM | POA: Insufficient documentation

## 2015-01-07 DIAGNOSIS — F419 Anxiety disorder, unspecified: Secondary | ICD-10-CM | POA: Diagnosis not present

## 2015-01-07 DIAGNOSIS — R5383 Other fatigue: Secondary | ICD-10-CM | POA: Diagnosis not present

## 2015-01-07 DIAGNOSIS — Z915 Personal history of self-harm: Secondary | ICD-10-CM | POA: Insufficient documentation

## 2015-01-07 DIAGNOSIS — Z853 Personal history of malignant neoplasm of breast: Secondary | ICD-10-CM

## 2015-01-07 DIAGNOSIS — Z8619 Personal history of other infectious and parasitic diseases: Secondary | ICD-10-CM

## 2015-01-07 DIAGNOSIS — M459 Ankylosing spondylitis of unspecified sites in spine: Secondary | ICD-10-CM | POA: Diagnosis not present

## 2015-01-07 DIAGNOSIS — R7989 Other specified abnormal findings of blood chemistry: Secondary | ICD-10-CM | POA: Insufficient documentation

## 2015-01-07 DIAGNOSIS — R531 Weakness: Secondary | ICD-10-CM

## 2015-01-07 DIAGNOSIS — F17210 Nicotine dependence, cigarettes, uncomplicated: Secondary | ICD-10-CM | POA: Diagnosis not present

## 2015-01-07 DIAGNOSIS — M129 Arthropathy, unspecified: Secondary | ICD-10-CM | POA: Diagnosis not present

## 2015-01-07 DIAGNOSIS — B1920 Unspecified viral hepatitis C without hepatic coma: Secondary | ICD-10-CM | POA: Insufficient documentation

## 2015-01-07 DIAGNOSIS — C50912 Malignant neoplasm of unspecified site of left female breast: Secondary | ICD-10-CM

## 2015-01-07 NOTE — Progress Notes (Signed)
Only problem patient offers today is the chronic pain.

## 2015-01-07 NOTE — Progress Notes (Signed)
[facility]  [phone] [phone]  ID: Janet Zuniga OB: 10-05-1955  MR#: [id number]  [id number]  Patient Care Team: Katheren Shams as PCP - General  CHIEF COMPLAINT:  Chief Complaint  Patient presents with  . Breast Cancer    INTERVAL HISTORY: Patient last evaluated in November 2015. She continues to complain of left breast pain as well as back pain both of which are chronic and unchanged. She continues to have high anxiety and multiple social issues.  She denies any chest pain, shortness of breath, or hemoptysis. She denies any abdominal pain. She denies any nausea, vomiting, constipation, or diarrhea. She has no urinary complaints. She denies any easy bleeding or bruising.  Patient offers no further specific complaints.   REVIEW OF SYSTEMS:   Review of Systems  Constitutional: Positive for malaise/fatigue. Negative for fever.  Cardiovascular: Negative.  Negative for chest pain.  Gastrointestinal: Negative.   Musculoskeletal: Positive for back pain.  Neurological: Positive for weakness.  Psychiatric/Behavioral: Negative.     As per HPI. Otherwise, a complete review of systems is negatve.  PAST MEDICAL HISTORY: Past Medical History  Diagnosis Date  . Arthritis   . Blood in stool   . Hepatitis C 2007  . History of blood transfusion   . Breast cancer ([place])     PAST SURGICAL HISTORY: Past Surgical History  Procedure Laterality Date  . Foot surgery  2000 and 2003  . Knee surgery Left 2004  . Breast lumpectomy Left 2010    FAMILY HISTORY Family History  Problem Relation Age of Onset  . Arthritis Mother   . Stroke Mother   . Heart disease Mother   . Hypertension Mother   . Hypertension Father   . Hypertension Brother   . Heart disease Maternal Aunt        ADVANCED DIRECTIVES:    HEALTH MAINTENANCE: Social History  Substance Use Topics  . Smoking status: Current Every Day Smoker -- 1.00 packs/day   Types: Cigarettes  . Smokeless tobacco: Never Used  . Alcohol Use: Yes     Colonoscopy:  PAP:  Bone density:  Lipid panel:  Allergies  Allergen Reactions  . Penicillins Rash    Current Outpatient Prescriptions  Medication Sig Dispense Refill  . ALPRAZolam (XANAX) 1 MG tablet Take 1 mg by mouth 4 (four) times daily as needed for anxiety.     . benzonatate (TESSALON) 100 MG capsule Take 100 mg by mouth 3 (three) times daily as needed for cough.     . busPIRone (BUSPAR) 5 MG tablet Take 5 mg by mouth 3 (three) times daily.    . carisoprodol (SOMA) 350 MG tablet TAKE ONE TABLET BY MOUTH THREE TIMES DAILY AS NEEDED FOR MUSCLE SPASMS  1  . FLUoxetine (PROZAC) 40 MG capsule Take 40 mg by mouth daily.     Marland Kitchen ibuprofen (ADVIL,MOTRIN) 800 MG tablet TAKE ONE TABLET BY MOUTH THREE TIMES A DAY WITH FOOD  3  . Oxycodone HCl 10 MG TABS Take 1 tablet (10 mg total) by mouth 3 (three) times daily. 12 tablet 0  . oxyCODONE-acetaminophen (ROXICET) 5-325 MG per tablet Take 1 tablet by mouth every 4 (four) hours as needed for severe pain. (Patient taking differently: Take 1 tablet by mouth every 8 (eight) hours as needed for moderate pain or severe pain. ) 20 tablet 0  . traMADol (ULTRAM) 50 MG tablet Take 1 tablet (50 mg total) by mouth every 6 (six) hours as needed for  moderate pain. 12 tablet 0  . traMADol-acetaminophen (ULTRACET) 37.5-325 MG tablet Take 1 tablet by mouth every 8 (eight) hours as needed. for pain  2  . traZODone (DESYREL) 150 MG tablet Take 150 mg by mouth at bedtime.      No current facility-administered medications for this visit.    OBJECTIVE: Filed Vitals:   01/07/15 1050  BP: 138/92  Pulse: 67  Temp: 98.7 F (37.1 C)  Resp: 20     Body mass index is 28.13 kg/(m^2).    ECOG FS:0 - Asymptomatic  General: Well-developed, well-nourished, no acute distress. Eyes: Pink conjunctiva, anicteric sclera Breasts: bilateral breast and axilla without lumps or masses.  Lungs: Clear to  auscultation bilaterally. Heart: Regular rate and rhythm. No rubs, murmurs, or gallops. Abdomen: Soft, nontender, nondistended. No organomegaly noted, normoactive bowel sounds. Musculoskeletal: No edema, cyanosis, or clubbing. Neuro: Alert, answering all questions appropriately. Cranial nerves grossly intact. Skin: No rashes or petechiae noted. Psych: Normal affect.  LAB RESULTS:  Lab Results  Component Value Date   NA 140 10/26/2014   K 3.0* 10/26/2014   CL 107 10/26/2014   CO2 25 10/26/2014   GLUCOSE 108* 10/26/2014   BUN 9 10/26/2014   CREATININE 0.52 10/26/2014   CALCIUM 9.2 10/26/2014   PROT 7.9 10/26/2014   ALBUMIN 3.6 10/26/2014   AST 76* 10/26/2014   ALT 67* 10/26/2014   ALKPHOS 107 10/26/2014   BILITOT 0.7 10/26/2014   GFRNONAA >60 10/26/2014   GFRAA >60 10/26/2014    Lab Results  Component Value Date   WBC 7.4 10/26/2014   NEUTROABS 5.4 09/24/2014   HGB 13.8 10/26/2014   HCT 41.1 10/26/2014   MCV 98.5 10/26/2014   PLT 233 10/26/2014     STUDIES: No results found.  ASSESSMENT:  Stage IIa adenocarcinoma of the breast, triple negative.  PLAN:    1.  Breast cancer:  No evidence of disease. Patient's most recent mammogram on April 03, 2014  was reported as BI-RADS 2. Repeat in one year. Patient completed all of her chemotherapy in December 2010 and her adjuvant XRT in the Spring of 2011. Return to clinic in 1 year routine evaluation.  2.  Anxiety: Patient will no longer receive Xanax from this clinic. 3.  Ankylosing spondylitis: Treatment for her primary rheumatologist.  Patient should not receive narcotics from this clinic.  4.  Elevated LFTs: Secondary to hepatitis C. 5.  Previous suicide attempts: No refills on Xanax or narcotics as above.  Patient expressed understanding and was in agreement with this plan. She also understands that She can call clinic at any time with any questions, concerns, or complaints.     Lloyd Huger, MD   01/07/2015 11:01  AM

## 2015-01-08 LAB — CANCER ANTIGEN 27.29: CA 27.29: 24.5 U/mL (ref 0.0–38.6)

## 2015-01-11 ENCOUNTER — Emergency Department: Payer: Medicare Other

## 2015-01-11 ENCOUNTER — Inpatient Hospital Stay
Admission: EM | Admit: 2015-01-11 | Discharge: 2015-01-14 | DRG: 481 | Disposition: A | Discharge status: Skilled Nursing Facility | Payer: Medicare Other | Attending: Internal Medicine | Admitting: Internal Medicine

## 2015-01-11 ENCOUNTER — Encounter: Payer: Self-pay | Admitting: Emergency Medicine

## 2015-01-11 DIAGNOSIS — I9581 Postprocedural hypotension: Secondary | ICD-10-CM | POA: Diagnosis not present

## 2015-01-11 DIAGNOSIS — S72341A Displaced spiral fracture of shaft of right femur, initial encounter for closed fracture: Principal | ICD-10-CM | POA: Diagnosis present

## 2015-01-11 DIAGNOSIS — S72001A Fracture of unspecified part of neck of right femur, initial encounter for closed fracture: Secondary | ICD-10-CM

## 2015-01-11 DIAGNOSIS — M1990 Unspecified osteoarthritis, unspecified site: Secondary | ICD-10-CM | POA: Diagnosis present

## 2015-01-11 DIAGNOSIS — S7291XA Unspecified fracture of right femur, initial encounter for closed fracture: Secondary | ICD-10-CM

## 2015-01-11 DIAGNOSIS — F39 Unspecified mood [affective] disorder: Secondary | ICD-10-CM | POA: Diagnosis present

## 2015-01-11 DIAGNOSIS — Z823 Family history of stroke: Secondary | ICD-10-CM

## 2015-01-11 DIAGNOSIS — W19XXXA Unspecified fall, initial encounter: Secondary | ICD-10-CM

## 2015-01-11 DIAGNOSIS — F329 Major depressive disorder, single episode, unspecified: Secondary | ICD-10-CM | POA: Diagnosis present

## 2015-01-11 DIAGNOSIS — S72409A Unspecified fracture of lower end of unspecified femur, initial encounter for closed fracture: Secondary | ICD-10-CM

## 2015-01-11 DIAGNOSIS — M459 Ankylosing spondylitis of unspecified sites in spine: Secondary | ICD-10-CM | POA: Diagnosis present

## 2015-01-11 DIAGNOSIS — Z8249 Family history of ischemic heart disease and other diseases of the circulatory system: Secondary | ICD-10-CM

## 2015-01-11 DIAGNOSIS — Y9389 Activity, other specified: Secondary | ICD-10-CM

## 2015-01-11 DIAGNOSIS — W000XXA Fall on same level due to ice and snow, initial encounter: Secondary | ICD-10-CM | POA: Diagnosis present

## 2015-01-11 DIAGNOSIS — Y92481 Parking lot as the place of occurrence of the external cause: Secondary | ICD-10-CM

## 2015-01-11 DIAGNOSIS — Z853 Personal history of malignant neoplasm of breast: Secondary | ICD-10-CM

## 2015-01-11 DIAGNOSIS — Y92009 Unspecified place in unspecified non-institutional (private) residence as the place of occurrence of the external cause: Secondary | ICD-10-CM

## 2015-01-11 DIAGNOSIS — E876 Hypokalemia: Secondary | ICD-10-CM | POA: Diagnosis present

## 2015-01-11 DIAGNOSIS — F32A Depression, unspecified: Secondary | ICD-10-CM | POA: Diagnosis present

## 2015-01-11 DIAGNOSIS — S72309A Unspecified fracture of shaft of unspecified femur, initial encounter for closed fracture: Secondary | ICD-10-CM | POA: Diagnosis present

## 2015-01-11 DIAGNOSIS — D62 Acute posthemorrhagic anemia: Secondary | ICD-10-CM | POA: Diagnosis not present

## 2015-01-11 DIAGNOSIS — F17210 Nicotine dependence, cigarettes, uncomplicated: Secondary | ICD-10-CM | POA: Diagnosis present

## 2015-01-11 DIAGNOSIS — F419 Anxiety disorder, unspecified: Secondary | ICD-10-CM | POA: Diagnosis present

## 2015-01-11 DIAGNOSIS — M457 Ankylosing spondylitis of lumbosacral region: Secondary | ICD-10-CM | POA: Diagnosis present

## 2015-01-11 HISTORY — DX: Anxiety disorder, unspecified: F41.9

## 2015-01-11 LAB — BASIC METABOLIC PANEL
Anion gap: 11 (ref 5–15)
BUN: 15 mg/dL (ref 6–20)
CALCIUM: 9 mg/dL (ref 8.9–10.3)
CHLORIDE: 100 mmol/L — AB (ref 101–111)
CO2: 28 mmol/L (ref 22–32)
CREATININE: 0.58 mg/dL (ref 0.44–1.00)
GFR calc non Af Amer: >60 mL/min (ref >60)
Glucose, Bld: 93 mg/dL (ref 65–99)
Potassium: 2.4 mmol/L — CL (ref 3.5–5.1)
SODIUM: 139 mmol/L (ref 135–145)

## 2015-01-11 LAB — CBC WITH DIFFERENTIAL/PLATELET
BASOS ABS: 0 10*3/uL (ref 0–0.1)
BASOS PCT: 1 %
EOS ABS: 0.1 10*3/uL (ref 0–0.7)
Eosinophils Relative: 1 %
HCT: 44.5 % (ref 35.0–47.0)
HEMOGLOBIN: 14.9 g/dL (ref 12.0–16.0)
Lymphocytes Relative: 26 %
Lymphs Abs: 2.2 10*3/uL (ref 1.0–3.6)
MCH: 32.9 pg (ref 26.0–34.0)
MCHC: 33.4 g/dL (ref 32.0–36.0)
MCV: 98.6 fL (ref 80.0–100.0)
MONOS PCT: 7 %
Monocytes Absolute: 0.6 10*3/uL (ref 0.2–0.9)
NEUTROS PCT: 65 %
Neutro Abs: 5.6 10*3/uL (ref 1.4–6.5)
Platelets: 212 10*3/uL (ref 150–440)
RBC: 4.52 MIL/uL (ref 3.80–5.20)
RDW: 14.1 % (ref 11.5–14.5)
WBC: 8.6 10*3/uL (ref 3.6–11.0)

## 2015-01-11 LAB — TYPE AND SCREEN
ABO/RH(D): O NEG
Antibody Screen: NEGATIVE

## 2015-01-11 LAB — PROTIME-INR
INR: 1.06
PROTHROMBIN TIME: 14 s (ref 11.4–15.0)

## 2015-01-11 MED ORDER — HYDROMORPHONE HCL 1 MG/ML IJ SOLN
1.0000 mg | INTRAMUSCULAR | Status: AC
  Administered 2015-01-11: 1 mg via INTRAVENOUS

## 2015-01-11 MED ORDER — HYDROCODONE-ACETAMINOPHEN 5-325 MG PO TABS: 1.0000 | ORAL_TABLET | Freq: Four times a day (QID) | ORAL | Status: DC | PRN

## 2015-01-11 MED ORDER — POTASSIUM CHLORIDE CRYS ER 20 MEQ PO TBCR
40.0000 meq | EXTENDED_RELEASE_TABLET | Freq: Once | ORAL | Status: AC
  Administered 2015-01-11: 40 meq via ORAL

## 2015-01-11 MED ORDER — HYDROMORPHONE HCL 1 MG/ML IJ SOLN
INTRAMUSCULAR | Status: AC
  Administered 2015-01-11: 1 mg via INTRAVENOUS
  Filled 2015-01-11: qty 1

## 2015-01-11 MED ORDER — MORPHINE SULFATE (PF) 4 MG/ML IV SOLN
4.0000 mg | Freq: Once | INTRAVENOUS | Status: AC
Start: 2015-01-11 — End: 2015-01-11
  Administered 2015-01-11: 4 mg via INTRAVENOUS

## 2015-01-11 MED ORDER — SENNA 8.6 MG PO TABS
1.0000 | ORAL_TABLET | Freq: Two times a day (BID) | ORAL | Status: DC
  Administered 2015-01-12 – 2015-01-14 (×4): 8.6 mg via ORAL
  Filled 2015-01-11 (×5): qty 1

## 2015-01-11 MED ORDER — POTASSIUM CHLORIDE CRYS ER 20 MEQ PO TBCR
EXTENDED_RELEASE_TABLET | ORAL | Status: AC
  Filled 2015-01-11: qty 2

## 2015-01-11 MED ORDER — MORPHINE SULFATE (PF) 2 MG/ML IV SOLN
2.0000 mg | INTRAVENOUS | Status: DC | PRN
  Administered 2015-01-12 (×3): 2 mg via INTRAVENOUS
  Filled 2015-01-11 (×3): qty 1

## 2015-01-11 MED ORDER — METHOCARBAMOL 500 MG PO TABS
500.0000 mg | ORAL_TABLET | Freq: Four times a day (QID) | ORAL | Status: DC | PRN
  Administered 2015-01-13 – 2015-01-14 (×2): 500 mg via ORAL
  Filled 2015-01-11 (×2): qty 1

## 2015-01-11 MED ORDER — MORPHINE SULFATE (PF) 4 MG/ML IV SOLN
INTRAVENOUS | Status: AC
  Administered 2015-01-11: 4 mg via INTRAVENOUS
  Filled 2015-01-11: qty 1

## 2015-01-11 MED ORDER — MORPHINE SULFATE (PF) 2 MG/ML IV SOLN
INTRAVENOUS | Status: AC
  Administered 2015-01-11: 2 mg via INTRAVENOUS
  Filled 2015-01-11: qty 1

## 2015-01-11 MED ORDER — METHOCARBAMOL 1000 MG/10ML IJ SOLN: 500.0000 mg | Freq: Four times a day (QID) | INTRAVENOUS | Status: DC | PRN

## 2015-01-11 NOTE — ED Notes (Signed)
Pt continues to ask for pain medication. Pt has been updated that MD has not ordered any more pain medications. Pt has been updated on surgery and on plans for admission. Pt in no acute distress at this time.

## 2015-01-11 NOTE — ED Provider Notes (Signed)
[facility] Emergency Department Provider Note    ____________________________________________  Time seen: 2150  I have reviewed the triage vital signs and the nursing notes.   HISTORY  Chief Complaint Knee Pain   History limited by: Not Limited   HPI Janet Zuniga is a 60 y.o. female with history of breast cancer, chronic pain who presents to the emergency department today because of concerns for right thigh and knee pain. The patient states she was walking to go visit a friend when she slipped on the ice. She does not exactly recall how she slipped on the ice. She states that she did lay on the ground for a number of minutes before help came. She states that pain started immediately and her right leg. It was severe. It has been constant. It is worse with movement. She denies hitting her head or loss of consciousness.     Past Medical History  Diagnosis Date  . Arthritis   . Blood in stool   . Hepatitis C 2007  . History of blood transfusion   . Breast cancer [facility])     Patient Active Problem List   Diagnosis Date Noted  . Major depression ([place]) 10/27/2014  . Substance induced mood disorder ([place]) 10/27/2014  . History of breast cancer in female 12/23/2013  . Chronic pain 12/23/2013  . Ankylosing spondylitis of lumbosacral region ([place]) 12/23/2013  . Depression with anxiety 12/23/2013    Past Surgical History  Procedure Laterality Date  . Foot surgery  2000 and 2003  . Knee surgery Left 2004  . Breast lumpectomy Left 2010    Current Outpatient Rx  Name  Route  Sig  Dispense  Refill  . ALPRAZolam (XANAX) 1 MG tablet   Oral   Take 1 mg by mouth 4 (four) times daily as needed for anxiety.          . benzonatate (TESSALON) 100 MG capsule   Oral   Take 100 mg by mouth 3 (three) times daily as needed for cough.          . busPIRone (BUSPAR) 5 MG tablet   Oral   Take 5 mg by mouth 3 (three) times daily.         . carisoprodol  (SOMA) 350 MG tablet      TAKE ONE TABLET BY MOUTH THREE TIMES DAILY AS NEEDED FOR MUSCLE SPASMS      1   . FLUoxetine (PROZAC) 40 MG capsule   Oral   Take 40 mg by mouth daily.          Marland Kitchen ibuprofen (ADVIL,MOTRIN) 800 MG tablet      TAKE ONE TABLET BY MOUTH THREE TIMES A DAY WITH FOOD      3   . Oxycodone HCl 10 MG TABS   Oral   Take 1 tablet (10 mg total) by mouth 3 (three) times daily.   12 tablet   0   . oxyCODONE-acetaminophen (ROXICET) 5-325 MG per tablet   Oral   Take 1 tablet by mouth every 4 (four) hours as needed for severe pain. Patient taking differently: Take 1 tablet by mouth every 8 (eight) hours as needed for moderate pain or severe pain.    20 tablet   0   . traMADol (ULTRAM) 50 MG tablet   Oral   Take 1 tablet (50 mg total) by mouth every 6 (six) hours as needed for moderate pain.   12 tablet   0   .  traMADol-acetaminophen (ULTRACET) 37.5-325 MG tablet   Oral   Take 1 tablet by mouth every 8 (eight) hours as needed. for pain      2   . traZODone (DESYREL) 150 MG tablet   Oral   Take 150 mg by mouth at bedtime.            Allergies Penicillins  Family History  Problem Relation Age of Onset  . Arthritis Mother   . Stroke Mother   . Heart disease Mother   . Hypertension Mother   . Hypertension Father   . Hypertension Brother   . Heart disease Maternal Aunt     Social History Social History  Substance Use Topics  . Smoking status: Current Every Day Smoker -- 1.00 packs/day    Types: Cigarettes  . Smokeless tobacco: Never Used  . Alcohol Use: Yes    Review of Systems  Constitutional: Negative for fever. Cardiovascular: Negative for chest pain. Respiratory: Negative for shortness of breath. Gastrointestinal: Negative for abdominal pain, vomiting and diarrhea. Neurological: Negative for headaches, focal weakness or numbness.  10-point ROS otherwise negative.  ____________________________________________   PHYSICAL  EXAM:  VITAL SIGNS: ED Triage Vitals  Enc Vitals Group     BP 01/11/15 2042 124/76 mmHg     Pulse Rate 01/11/15 2042 63     Resp 01/11/15 2042 18     Temp 01/11/15 2042 97.8 F (36.6 C)     Temp Source 01/11/15 2042 Oral     SpO2 01/11/15 2042 97 %     Weight 01/11/15 2042 148 lb (67.132 kg)     Height 01/11/15 2042 5\' 1"  (1.549 m)     Head Cir --      Peak Flow --      Pain Score 01/11/15 2045 10   Constitutional: Alert and oriented. Appears uncomfortable. Eyes: Conjunctivae are normal. PERRL. Normal extraocular movements. ENT   Head: Normocephalic and atraumatic.   Nose: No congestion/rhinnorhea.   Mouth/Throat: Mucous membranes are moist.   Neck: No stridor. Hematological/Lymphatic/Immunilogical: No cervical lymphadenopathy. Cardiovascular: Normal rate, regular rhythm.  No murmurs, rubs, or gallops. Respiratory: Normal respiratory effort without tachypnea nor retractions. Breath sounds are clear and equal bilaterally. No wheezes/rales/rhonchi. Gastrointestinal: Soft and nontender. No distention.  Genitourinary: Deferred Musculoskeletal: Right leg externally rotated and slightly shortened. Tender to manipulation and palpation. No skin break. NV intact distally. Neurologic:  Normal speech and language. No gross focal neurologic deficits are appreciated.  Skin:  Skin is warm, dry and intact. No rash noted. Psychiatric: Mood and affect are normal. Speech and behavior are normal. Patient exhibits appropriate insight and judgment.  ____________________________________________    LABS (pertinent positives/negatives)  Labs Reviewed  CBC WITH DIFFERENTIAL/PLATELET  PROTIME-INR  BASIC METABOLIC PANEL  TYPE AND SCREEN     ____________________________________________   EKG  None  ____________________________________________    RADIOLOGY  Femur right  IMPRESSION: Displaced spiral fracture of the distal right femur.  I, Davinder Haff, personally  viewed and evaluated these images (plain femur radiographs) as part of my medical decision making. ____________________________________________   PROCEDURES  Procedure(s) performed: None  Critical Care performed: No  ____________________________________________   INITIAL IMPRESSION / ASSESSMENT AND PLAN / ED COURSE  Pertinent labs & imaging results that were available during my care of the patient were reviewed by me and considered in my medical decision making (see chart for details).  Patient presented to the emergency department today after a fall and right thigh and knee pain. X-rays  of the right femur show a distal spiral fracture. I discussed with Dr. Sabra Heck who will plan on taking the patient to or tomorrow. Discussed with the hospitalist service for admission.  ____________________________________________   FINAL CLINICAL IMPRESSION(S) / ED DIAGNOSES  Final diagnoses:  Closed fracture of right femur, unspecified fracture morphology, unspecified portion of femur, initial encounter ([place])     Nance Pear, MD 01/11/15 2236

## 2015-01-11 NOTE — ED Notes (Addendum)
Pt presents to ED after she slipped on ice and fell landing on her right knee. Pt c/o severe right knee pain. No obvious deformity. Ice applied during triage.

## 2015-01-12 ENCOUNTER — Inpatient Hospital Stay: Payer: Medicare Other

## 2015-01-12 ENCOUNTER — Encounter: Payer: Self-pay | Admitting: Internal Medicine

## 2015-01-12 ENCOUNTER — Inpatient Hospital Stay: Payer: Medicare Other | Admitting: Certified Registered Nurse Anesthetist

## 2015-01-12 ENCOUNTER — Encounter
Admission: EM | Disposition: A | Discharge status: Skilled Nursing Facility | Payer: Self-pay | Source: Home / Self Care | Attending: Internal Medicine

## 2015-01-12 DIAGNOSIS — Z8249 Family history of ischemic heart disease and other diseases of the circulatory system: Secondary | ICD-10-CM | POA: Diagnosis not present

## 2015-01-12 DIAGNOSIS — W000XXA Fall on same level due to ice and snow, initial encounter: Secondary | ICD-10-CM | POA: Diagnosis present

## 2015-01-12 DIAGNOSIS — D62 Acute posthemorrhagic anemia: Secondary | ICD-10-CM | POA: Diagnosis not present

## 2015-01-12 DIAGNOSIS — Z853 Personal history of malignant neoplasm of breast: Secondary | ICD-10-CM | POA: Diagnosis not present

## 2015-01-12 DIAGNOSIS — F329 Major depressive disorder, single episode, unspecified: Secondary | ICD-10-CM | POA: Diagnosis present

## 2015-01-12 DIAGNOSIS — M1990 Unspecified osteoarthritis, unspecified site: Secondary | ICD-10-CM | POA: Diagnosis present

## 2015-01-12 DIAGNOSIS — S72341A Displaced spiral fracture of shaft of right femur, initial encounter for closed fracture: Secondary | ICD-10-CM | POA: Diagnosis present

## 2015-01-12 DIAGNOSIS — Y9389 Activity, other specified: Secondary | ICD-10-CM | POA: Diagnosis not present

## 2015-01-12 DIAGNOSIS — Z823 Family history of stroke: Secondary | ICD-10-CM | POA: Diagnosis not present

## 2015-01-12 DIAGNOSIS — S72309A Unspecified fracture of shaft of unspecified femur, initial encounter for closed fracture: Secondary | ICD-10-CM | POA: Diagnosis present

## 2015-01-12 DIAGNOSIS — I9581 Postprocedural hypotension: Secondary | ICD-10-CM | POA: Diagnosis not present

## 2015-01-12 DIAGNOSIS — F17210 Nicotine dependence, cigarettes, uncomplicated: Secondary | ICD-10-CM | POA: Diagnosis present

## 2015-01-12 DIAGNOSIS — M459 Ankylosing spondylitis of unspecified sites in spine: Secondary | ICD-10-CM | POA: Diagnosis present

## 2015-01-12 DIAGNOSIS — E876 Hypokalemia: Secondary | ICD-10-CM | POA: Diagnosis present

## 2015-01-12 DIAGNOSIS — F419 Anxiety disorder, unspecified: Secondary | ICD-10-CM | POA: Diagnosis present

## 2015-01-12 DIAGNOSIS — Y92009 Unspecified place in unspecified non-institutional (private) residence as the place of occurrence of the external cause: Secondary | ICD-10-CM | POA: Diagnosis not present

## 2015-01-12 DIAGNOSIS — Y92481 Parking lot as the place of occurrence of the external cause: Secondary | ICD-10-CM | POA: Diagnosis not present

## 2015-01-12 HISTORY — PX: INTRAMEDULLARY (IM) NAIL INTERTROCHANTERIC: SHX5875

## 2015-01-12 LAB — CBC
HCT: 30.4 % — ABNORMAL LOW (ref 35.0–47.0)
HEMATOCRIT: 36.6 % (ref 35.0–47.0)
HEMOGLOBIN: 12.4 g/dL (ref 12.0–16.0)
Hemoglobin: 10.1 g/dL — ABNORMAL LOW (ref 12.0–16.0)
MCH: 33 pg (ref 26.0–34.0)
MCH: 33.3 pg (ref 26.0–34.0)
MCHC: 33.9 g/dL (ref 32.0–36.0)
MCHC: 33.2 g/dL (ref 32.0–36.0)
MCV: 97.3 fL (ref 80.0–100.0)
MCV: 100.2 fL — ABNORMAL HIGH (ref 80.0–100.0)
PLATELETS: 154 10*3/uL (ref 150–440)
Platelets: 188 10*3/uL (ref 150–440)
RBC: 3.76 MIL/uL — ABNORMAL LOW (ref 3.80–5.20)
RBC: 3.04 MIL/uL — AB (ref 3.80–5.20)
RDW: 14 % (ref 11.5–14.5)
RDW: 14.1 % (ref 11.5–14.5)
WBC: 10.1 10*3/uL (ref 3.6–11.0)
WBC: 14.6 10*3/uL — AB (ref 3.6–11.0)

## 2015-01-12 LAB — BASIC METABOLIC PANEL
ANION GAP: 4 — AB (ref 5–15)
BUN: 12 mg/dL (ref 6–20)
CO2: 28 mmol/L (ref 22–32)
Calcium: 8.1 mg/dL — ABNORMAL LOW (ref 8.9–10.3)
Chloride: 104 mmol/L (ref 101–111)
Creatinine, Ser: 0.5 mg/dL (ref 0.44–1.00)
GFR calc Af Amer: >60 mL/min (ref >60)
GLUCOSE: 99 mg/dL (ref 65–99)
POTASSIUM: 3.7 mmol/L (ref 3.5–5.1)
Sodium: 136 mmol/L (ref 135–145)

## 2015-01-12 LAB — CREATININE, SERUM: CREATININE: 0.66 mg/dL (ref 0.44–1.00)

## 2015-01-12 LAB — PROTIME-INR
INR: 1.07
Prothrombin Time: 14.1 seconds (ref 11.4–15.0)

## 2015-01-12 LAB — MRSA PCR SCREENING: MRSA BY PCR: NEGATIVE

## 2015-01-12 LAB — ABO/RH: ABO/RH(D): O NEG

## 2015-01-12 SURGERY — FIXATION, FRACTURE, INTERTROCHANTERIC, WITH INTRAMEDULLARY ROD
Anesthesia: Spinal | Laterality: Right

## 2015-01-12 MED ORDER — CLINDAMYCIN PHOSPHATE 600 MG/50ML IV SOLN
600.0000 mg | Freq: Three times a day (TID) | INTRAVENOUS | Status: AC
  Administered 2015-01-12 – 2015-01-13 (×3): 600 mg via INTRAVENOUS
  Filled 2015-01-12 (×3): qty 50

## 2015-01-12 MED ORDER — TETRACAINE HCL 1 % IJ SOLN
INTRAMUSCULAR | Status: DC | PRN
  Administered 2015-01-12: 10 mg via INTRASPINAL

## 2015-01-12 MED ORDER — ALPRAZOLAM 0.5 MG PO TABS
1.0000 mg | ORAL_TABLET | Freq: Four times a day (QID) | ORAL | Status: DC | PRN
  Administered 2015-01-12 – 2015-01-14 (×6): 1 mg via ORAL
  Filled 2015-01-12 (×6): qty 2

## 2015-01-12 MED ORDER — NEOMYCIN-POLYMYXIN B GU 40-200000 IR SOLN
Status: AC
  Filled 2015-01-12: qty 2

## 2015-01-12 MED ORDER — SENNA 8.6 MG PO TABS
1.0000 | ORAL_TABLET | Freq: Two times a day (BID) | ORAL | Status: DC
  Administered 2015-01-12 – 2015-01-13 (×2): 8.6 mg via ORAL

## 2015-01-12 MED ORDER — CEFAZOLIN SODIUM-DEXTROSE 2-3 GM-% IV SOLR
2.0000 g | INTRAVENOUS | Status: AC
  Administered 2015-01-12: 2 g via INTRAVENOUS
  Filled 2015-01-12: qty 50

## 2015-01-12 MED ORDER — ENOXAPARIN SODIUM 30 MG/0.3ML ~~LOC~~ SOLN
30.0000 mg | SUBCUTANEOUS | Status: DC
  Administered 2015-01-13: 30 mg via SUBCUTANEOUS
  Filled 2015-01-12: qty 0.3

## 2015-01-12 MED ORDER — OXYCODONE-ACETAMINOPHEN 5-325 MG PO TABS
1.0000 | ORAL_TABLET | Freq: Four times a day (QID) | ORAL | Status: DC | PRN
  Administered 2015-01-12 – 2015-01-13 (×3): 2 via ORAL
  Filled 2015-01-12 (×3): qty 2

## 2015-01-12 MED ORDER — CLINDAMYCIN PHOSPHATE 900 MG/50ML IV SOLN
900.0000 mg | INTRAVENOUS | Status: AC
  Administered 2015-01-12: 900 mg via INTRAVENOUS
  Filled 2015-01-12: qty 50

## 2015-01-12 MED ORDER — FLEET ENEMA 7-19 GM/118ML RE ENEM: 1.0000 | ENEMA | Freq: Once | RECTAL | Status: DC | PRN

## 2015-01-12 MED ORDER — MENTHOL 3 MG MT LOZG
1.0000 | LOZENGE | OROMUCOSAL | Status: DC | PRN
  Filled 2015-01-12: qty 9

## 2015-01-12 MED ORDER — LACTATED RINGERS IV SOLN
INTRAVENOUS | Status: DC
  Administered 2015-01-12 (×3): via INTRAVENOUS

## 2015-01-12 MED ORDER — CEFAZOLIN SODIUM-DEXTROSE 2-3 GM-% IV SOLR
2.0000 g | Freq: Four times a day (QID) | INTRAVENOUS | Status: AC
  Administered 2015-01-12 – 2015-01-13 (×2): 2 g via INTRAVENOUS
  Filled 2015-01-12 (×2): qty 50

## 2015-01-12 MED ORDER — POTASSIUM CHLORIDE 10 MEQ/100ML IV SOLN
10.0000 meq | INTRAVENOUS | Status: AC
  Administered 2015-01-12 (×4): 10 meq via INTRAVENOUS
  Filled 2015-01-12 (×4): qty 100

## 2015-01-12 MED ORDER — BUSPIRONE HCL 5 MG PO TABS
5.0000 mg | ORAL_TABLET | Freq: Three times a day (TID) | ORAL | Status: DC
  Administered 2015-01-12: 5 mg via ORAL
  Filled 2015-01-12 (×4): qty 1

## 2015-01-12 MED ORDER — ACETAMINOPHEN 325 MG PO TABS: 650.0000 mg | ORAL_TABLET | Freq: Four times a day (QID) | ORAL | Status: DC | PRN

## 2015-01-12 MED ORDER — DEXTROSE 5 % IV SOLN: 500.0000 mg | Freq: Four times a day (QID) | INTRAVENOUS | Status: DC | PRN

## 2015-01-12 MED ORDER — ALUM & MAG HYDROXIDE-SIMETH 200-200-20 MG/5ML PO SUSP: 30.0000 mL | ORAL | Status: DC | PRN

## 2015-01-12 MED ORDER — BISACODYL 10 MG RE SUPP
10.0000 mg | Freq: Every day | RECTAL | Status: DC | PRN
  Administered 2015-01-14: 10 mg via RECTAL
  Filled 2015-01-12: qty 1

## 2015-01-12 MED ORDER — METHOCARBAMOL 500 MG PO TABS
500.0000 mg | ORAL_TABLET | Freq: Four times a day (QID) | ORAL | Status: DC | PRN
  Administered 2015-01-13: 500 mg via ORAL
  Filled 2015-01-12: qty 1

## 2015-01-12 MED ORDER — ONDANSETRON HCL 4 MG PO TABS: 4.0000 mg | ORAL_TABLET | Freq: Four times a day (QID) | ORAL | Status: DC | PRN

## 2015-01-12 MED ORDER — TETRACAINE HCL 1 % IJ SOLN
INTRAMUSCULAR | Status: AC
  Filled 2015-01-12: qty 2

## 2015-01-12 MED ORDER — BUPIVACAINE-EPINEPHRINE (PF) 0.25% -1:200000 IJ SOLN
INTRAMUSCULAR | Status: AC
  Filled 2015-01-12: qty 30

## 2015-01-12 MED ORDER — MIDAZOLAM HCL 5 MG/5ML IJ SOLN
INTRAMUSCULAR | Status: DC | PRN
  Administered 2015-01-12: 2 mg via INTRAVENOUS

## 2015-01-12 MED ORDER — PROMETHAZINE HCL 25 MG/ML IJ SOLN: 6.2500 mg | INTRAMUSCULAR | Status: DC | PRN

## 2015-01-12 MED ORDER — LACTATED RINGERS IV SOLN
Freq: Once | INTRAVENOUS | Status: AC
  Administered 2015-01-12: 19:00:00 via INTRAVENOUS

## 2015-01-12 MED ORDER — EPHEDRINE SULFATE 50 MG/ML IJ SOLN
INTRAMUSCULAR | Status: DC | PRN
  Administered 2015-01-12 (×4): 10 mg via INTRAVENOUS
  Administered 2015-01-12: 15 mg via INTRAVENOUS
  Administered 2015-01-12: 5 mg via INTRAVENOUS

## 2015-01-12 MED ORDER — METOCLOPRAMIDE HCL 5 MG PO TABS: 5.0000 mg | ORAL_TABLET | Freq: Three times a day (TID) | ORAL | Status: DC | PRN

## 2015-01-12 MED ORDER — MAGNESIUM HYDROXIDE 400 MG/5ML PO SUSP
30.0000 mL | Freq: Every day | ORAL | Status: DC | PRN
Start: 2015-01-12 — End: 2015-01-14
  Administered 2015-01-13: 30 mL via ORAL
  Filled 2015-01-12: qty 30

## 2015-01-12 MED ORDER — CHLORHEXIDINE GLUCONATE 4 % EX LIQD
60.0000 mL | Freq: Once | CUTANEOUS | Status: AC
  Administered 2015-01-12: 4 via TOPICAL

## 2015-01-12 MED ORDER — PHENOL 1.4 % MT LIQD: 1.0000 | OROMUCOSAL | Status: DC | PRN

## 2015-01-12 MED ORDER — ACETAMINOPHEN 650 MG RE SUPP: 650.0000 mg | Freq: Four times a day (QID) | RECTAL | Status: DC | PRN

## 2015-01-12 MED ORDER — CELECOXIB 200 MG PO CAPS
200.0000 mg | ORAL_CAPSULE | Freq: Every day | ORAL | Status: DC
  Filled 2015-01-12 (×2): qty 1

## 2015-01-12 MED ORDER — FENTANYL CITRATE (PF) 100 MCG/2ML IJ SOLN: 25.0000 ug | INTRAMUSCULAR | Status: DC | PRN

## 2015-01-12 MED ORDER — ONDANSETRON HCL 4 MG/2ML IJ SOLN: 4.0000 mg | Freq: Four times a day (QID) | INTRAMUSCULAR | Status: DC | PRN

## 2015-01-12 MED ORDER — ZOLPIDEM TARTRATE 5 MG PO TABS: 5.0000 mg | ORAL_TABLET | Freq: Every evening | ORAL | Status: DC | PRN

## 2015-01-12 MED ORDER — FLUOXETINE HCL 20 MG PO CAPS
40.0000 mg | ORAL_CAPSULE | Freq: Every day | ORAL | Status: DC
  Administered 2015-01-12 – 2015-01-14 (×3): 40 mg via ORAL
  Filled 2015-01-12 (×3): qty 2

## 2015-01-12 MED ORDER — SODIUM CHLORIDE 0.45 % IV SOLN
INTRAVENOUS | Status: DC
  Administered 2015-01-12 – 2015-01-13 (×4): via INTRAVENOUS

## 2015-01-12 MED ORDER — BUPIVACAINE HCL (PF) 0.5 % IJ SOLN
INTRAMUSCULAR | Status: DC | PRN
  Administered 2015-01-12: 2 mL

## 2015-01-12 MED ORDER — SODIUM CHLORIDE 0.9 % IJ SOLN
INTRAMUSCULAR | Status: AC
  Filled 2015-01-12: qty 3

## 2015-01-12 MED ORDER — TETRACAINE HCL 1 % IJ SOLN
INTRAMUSCULAR | Status: AC
Start: 2015-01-12 — End: 2015-01-12
  Filled 2015-01-12: qty 2

## 2015-01-12 MED ORDER — PHENYLEPHRINE HCL 10 MG/ML IJ SOLN
0.0000 ug/min | INTRAMUSCULAR | Status: DC
  Administered 2015-01-12 (×2): 20 ug/min via INTRAVENOUS
  Administered 2015-01-12: 10 ug/min via INTRAVENOUS
  Filled 2015-01-12: qty 1

## 2015-01-12 MED ORDER — METOCLOPRAMIDE HCL 5 MG/ML IJ SOLN: 5.0000 mg | Freq: Three times a day (TID) | INTRAMUSCULAR | Status: DC | PRN

## 2015-01-12 MED ORDER — SODIUM CHLORIDE 0.45 % IV SOLN
INTRAVENOUS | Status: DC
  Administered 2015-01-12: 02:00:00 via INTRAVENOUS

## 2015-01-12 MED ORDER — FENTANYL CITRATE (PF) 100 MCG/2ML IJ SOLN
INTRAMUSCULAR | Status: DC | PRN
  Administered 2015-01-12 (×2): 50 ug via INTRAVENOUS

## 2015-01-12 MED ORDER — PROPOFOL 500 MG/50ML IV EMUL
INTRAVENOUS | Status: DC | PRN
  Administered 2015-01-12: 75 ug/kg/min via INTRAVENOUS

## 2015-01-12 MED ORDER — MORPHINE SULFATE (PF) 2 MG/ML IV SOLN: 2.0000 mg | INTRAVENOUS | Status: DC | PRN

## 2015-01-12 MED ORDER — ACETAMINOPHEN 500 MG PO TABS
1000.0000 mg | ORAL_TABLET | Freq: Four times a day (QID) | ORAL | Status: AC
  Administered 2015-01-12: 1000 mg via ORAL
  Filled 2015-01-12 (×2): qty 2

## 2015-01-12 MED ORDER — PHENYLEPHRINE HCL 10 MG/ML IJ SOLN
INTRAMUSCULAR | Status: DC | PRN
  Administered 2015-01-12 (×2): 100 ug via INTRAVENOUS
  Administered 2015-01-12: 200 ug via INTRAVENOUS
  Administered 2015-01-12: 100 ug via INTRAVENOUS

## 2015-01-12 MED ORDER — TRAZODONE HCL 50 MG PO TABS
150.0000 mg | ORAL_TABLET | Freq: Every day | ORAL | Status: DC
Start: 2015-01-12 — End: 2015-01-14
  Filled 2015-01-12: qty 1

## 2015-01-12 MED ORDER — POTASSIUM CHLORIDE CRYS ER 20 MEQ PO TBCR: 40.0000 meq | EXTENDED_RELEASE_TABLET | Freq: Once | ORAL | Status: DC

## 2015-01-12 MED ORDER — FERROUS SULFATE 325 (65 FE) MG PO TABS
325.0000 mg | ORAL_TABLET | Freq: Every day | ORAL | Status: DC
  Administered 2015-01-13 – 2015-01-14 (×2): 325 mg via ORAL
  Filled 2015-01-12 (×2): qty 1

## 2015-01-12 MED ORDER — ACETAMINOPHEN 10 MG/ML IV SOLN
INTRAVENOUS | Status: DC | PRN
  Administered 2015-01-12: 1000 mg via INTRAVENOUS

## 2015-01-12 MED ORDER — DOCUSATE SODIUM 100 MG PO CAPS
100.0000 mg | ORAL_CAPSULE | Freq: Two times a day (BID) | ORAL | Status: DC
  Administered 2015-01-12 – 2015-01-14 (×4): 100 mg via ORAL
  Filled 2015-01-12 (×6): qty 1

## 2015-01-12 MED ORDER — ACETAMINOPHEN 10 MG/ML IV SOLN
INTRAVENOUS | Status: AC
  Filled 2015-01-12: qty 100

## 2015-01-12 SURGICAL SUPPLY — 39 items
BIT DRILL CALIBRATED 4.2 (BIT) ×1 IMPLANT
BIT DRILL GRAY 5X250 (MISCELLANEOUS) IMPLANT
BIT DRILL RADIO 4.0 (BIT) IMPLANT
BIT DRILL SHORT 4.2 (BIT) ×1 IMPLANT
CABLE 1.7 (Orthopedic Implant) ×2 IMPLANT
CANISTER SUCT 1200ML W/VALVE (MISCELLANEOUS) ×2 IMPLANT
CHLORAPREP W/TINT 26ML (MISCELLANEOUS) ×4 IMPLANT
DRAPE INCISE 23X17 IOBAN STRL (DRAPES) ×1
DRAPE INCISE IOBAN 23X17 STRL (DRAPES) ×1 IMPLANT
DRILL BIT CALIBRATED 4.2 (BIT) ×2
DRILL BIT SHORT 4.2 (BIT) ×1
DRSG AQUACEL AG ADV 3.5X10 (GAUZE/BANDAGES/DRESSINGS) ×2 IMPLANT
GAUZE PETRO XEROFOAM 1X8 (MISCELLANEOUS) ×2 IMPLANT
GAUZE SPONGE 4X4 12PLY STRL (GAUZE/BANDAGES/DRESSINGS) IMPLANT
GLOVE INDICATOR 8.0 STRL GRN (GLOVE) ×4 IMPLANT
GLOVE SURG ORTHO 8.5 STRL (GLOVE) ×2 IMPLANT
GOWN STRL REUS W/ TWL LRG LVL3 (GOWN DISPOSABLE) ×3 IMPLANT
GOWN STRL REUS W/TWL LRG LVL3 (GOWN DISPOSABLE) ×3
GUIDEWIRE 3.2X400 (WIRE) ×2 IMPLANT
KIT RM TURNOVER STRD PROC AR (KITS) ×2 IMPLANT
MAT BLUE FLOOR 46X72 FLO (MISCELLANEOUS) ×2 IMPLANT
NAIL FEM RECON 11X340 RT (Nail) ×2 IMPLANT
NEEDLE SPNL 18GX3.5 QUINCKE PK (NEEDLE) ×2 IMPLANT
NS IRRIG 500ML POUR BTL (IV SOLUTION) ×2 IMPLANT
PACK HIP COMPR (MISCELLANEOUS) ×2 IMPLANT
PAD GROUND ADULT SPLIT (MISCELLANEOUS) ×2 IMPLANT
REAMER ROD DEEP FLUTE 2.5X950 (INSTRUMENTS) ×4 IMPLANT
SCREW LOCK STAR 5X40 (Screw) ×4 IMPLANT
SCREW LOCK STAR 5X50 (Screw) ×2 IMPLANT
SPONGE LAP 18X18 5 PK (GAUZE/BANDAGES/DRESSINGS) ×8 IMPLANT
STAPLER SKIN PROX 35W (STAPLE) ×2 IMPLANT
SUCTION FRAZIER HANDLE 10FR (MISCELLANEOUS) ×1
SUCTION TUBE FRAZIER 10FR DISP (MISCELLANEOUS) ×1 IMPLANT
SUT QUILL PDO 0 36 36 VIOLET (SUTURE) ×2 IMPLANT
SUT QUILL PDO 2 24X24 VLT (SUTURE) ×2 IMPLANT
SUT VIC AB 0 CT1 36 (SUTURE) ×2 IMPLANT
SUT VIC AB 2-0 CT1 27 (SUTURE)
SUT VIC AB 2-0 CT1 TAPERPNT 27 (SUTURE) IMPLANT
SYR 30ML LL (SYRINGE) ×2 IMPLANT

## 2015-01-12 NOTE — Op Note (Deleted)
[id number]  4:24 PM  PATIENT:  Janet Zuniga    PRE-OPERATIVE DIAGNOSIS:  Distal,spiral, displaced right femur fracture  POST-OPERATIVE DIAGNOSIS:  Same  PROCEDURE:  INTRAMEDULLARY (IM) NAIL RIGHT FEMUR SURGEON:  Park Breed, MD   ANESTHESIA:   Spinal  PREOPERATIVE INDICATIONS:  Janet Zuniga is a  60 y.o. female with a diagnosis of right femur fracture who elected for surgical management after discussion of the risks and benefits of surgical treatment.    The risks benefits and alternatives were discussed with the patient preoperatively including but not limited to the risks of infection, bleeding, nerve injury, cardiopulmonary complications, the need for revision surgery, among others, and the patient was willing to proceed.  OPERATIVE IMPLANTS: Synthes lateral entry intramedullary femoral rod (340 mm/11 mm)          1 Dall Miles cable                       1 proximal, 2 distal locking screws  OPERATIVE FINDINGS: Patient had a markedly displaced rotated spiral fracture of the distal third of the right femur  EBL: [id number]  COMPLICATIONS:   None  OPERATIVE PROCEDURE: The patient was brought to the operating room and underwent spinal anesthesia.  Placed on the fracture table with the nonoperative leg flexed and abducted and the operative leg placed in traction.  Fluoroscopy showed satisfactory positioning although there was some rotational abnormality distally.  The hip and thigh were prepped and draped in sterile fashion.  A longitudinal incision was made from the tip of the greater trochanter proximally for several centimeters.  Sharp dissection was carried out through subcutaneous tissue and fascia.  A guidepin was inserted through the tip of the trochanter and advanced.  Fluoroscopy confirmed its good position.  The large reamer was used to open the proximal femur and a guide pin advanced distally.  Due to residual displacement, it was impossible to get the guide pin to pass easily.   Therefore, a distal lateral incision was made with dissection carried out sharply through subcutaneous tissue and fascia.  The muscle was divided and the fracture site exposed.  Irrigation and debridement was carried out of the fracture site and the leg was rotated to provide better positioning.  A [place] claw clamp was affixed to hold the fracture in virtually anatomic position.  The guidepin was then advanced easily across the fracture site down to the lower end of the femur at the level of the patella.  This measured 345 mm in length.  The shaft was then sequentially reamed to 12.5 mm.  A 340 mm/11 mm lateral entry trochanteric femoral rod was inserted and seated fully.  2 distal locking screws were inserted under fluoroscopic control.  A single proximal screw was then inserted.  A Dall-Miles cable was then passed around the femur at the midportion of the fracture site and tightened securely.  The clamp was removed and the fracture construct remained stable.  Fluoroscopy showed the hardware to be in excellent position and the fracture to be anatomically reduced.  Wounds were irrigated.  Deep fascia was closed with #2 Quill and subcutaneous tissue closed with number 0 Quill.  The wounds were closed with staples.  Aquacel dressings were applied.  Patient's leg was taken out of traction and a good alignment and motion.  Patient was transferred to a hospital bed and taken to recovery in good condition.  Park Breed, MD

## 2015-01-12 NOTE — Consult Note (Signed)
ORTHOPAEDIC CONSULTATION  REQUESTING PHYSICIAN: Fritzi Mandes, MD  Chief Complaint: Right leg pain  HPI: Janet Zuniga is a 60 y.o. female who complains of  right leg pain secondary to a slip and fall on the ice last night.  She was in her parking lot at home when she fell.  Exam and x-rays in the emergency room revealed a spiral displaced fracture of the lower third of the right femur.  This was extra articular.  She was admitted to the medical service for evaluation and clearance for surgery.  I have discussed the treatment with the patient and her siblings.  I have recommended open reduction internal fixation of the fracture to stabilize it and facilitate her rehabilitation.  A rod or plate will be used depending on the appearance of the fracture in traction.  Risks and benefits and postop protocol were discussed with him at length.  She wishes to go ahead and proceed later today.  Past Medical History  Diagnosis Date  . Arthritis   . Blood in stool   . Hepatitis C 2007  . History of blood transfusion   . Breast cancer ([place])   . Anxiety    Past Surgical History  Procedure Laterality Date  . Foot surgery  2000 and 2003  . Knee surgery Left 2004  . Breast lumpectomy Left 2010   Social History   Social History  . Marital Status: Single    Spouse Name: N/A  . Number of Children: N/A  . Years of Education: N/A   Social History Main Topics  . Smoking status: Current Every Day Smoker -- 1.00 packs/day    Types: Cigarettes  . Smokeless tobacco: Never Used  . Alcohol Use: Yes  . Drug Use: No  . Sexual Activity: No   Other Topics Concern  . None   Social History Narrative   Family History  Problem Relation Age of Onset  . Arthritis Mother   . Stroke Mother   . Heart disease Mother   . Hypertension Mother   . Hypertension Father   . Hypertension Brother   . Heart disease Maternal Aunt    Allergies  Allergen Reactions  . Penicillins Rash and Other (See Comments)     Has patient had a PCN reaction causing immediate rash, facial/tongue/throat swelling, SOB or lightheadedness with hypotension: Yes Has patient had a PCN reaction causing severe rash involving mucus membranes or skin necrosis: No Has patient had a PCN reaction that required hospitalization No Has patient had a PCN reaction occurring within the last 10 years: No If all of the above answers are "NO", then may proceed with Cephalosporin use.    Prior to Admission medications   Medication Sig Start Date End Date Taking? Authorizing Provider  ALPRAZolam Duanne Moron) 1 MG tablet Take 1 mg by mouth 4 (four) times daily as needed for anxiety.    Yes Historical Provider, MD  busPIRone (BUSPAR) 5 MG tablet Take 5 mg by mouth 3 (three) times daily.   Yes Historical Provider, MD  carisoprodol (SOMA) 350 MG tablet TAKE ONE TABLET BY MOUTH THREE TIMES DAILY AS NEEDED FOR MUSCLE SPASMS 12/24/14  Yes Historical Provider, MD  FLUoxetine (PROZAC) 40 MG capsule Take 40 mg by mouth daily.    Yes Historical Provider, MD  oxyCODONE-acetaminophen (ROXICET) 5-325 MG per tablet Take 1 tablet by mouth every 4 (four) hours as needed for severe pain. Patient taking differently: Take 1 tablet by mouth every 8 (eight) hours as needed  for moderate pain or severe pain.  05/24/14  Yes Nena Polio, MD  traZODone (DESYREL) 150 MG tablet Take 150 mg by mouth at bedtime.    Yes Historical Provider, MD  Oxycodone HCl 10 MG TABS Take 1 tablet (10 mg total) by mouth 3 (three) times daily. Patient not taking: Reported on 01/11/2015 07/01/14   Pierce Crane Beers, PA-C  traMADol (ULTRAM) 50 MG tablet Take 1 tablet (50 mg total) by mouth every 6 (six) hours as needed for moderate pain. Patient not taking: Reported on 01/11/2015 09/16/14   Sable Feil, PA-C   Dg Chest 1 View  01/11/2015  CLINICAL DATA:  60 year old female with fall and right knee pain. EXAM: CHEST 1 VIEW COMPARISON:  Chest radiograph dated 09/24/2014 FINDINGS: The heart size and  mediastinal contours are within normal limits. Both lungs are clear. The visualized skeletal structures are unremarkable. IMPRESSION: No active disease. Electronically Signed   By: Anner Crete M.D.   On: 01/11/2015 22:19   Dg Femur, Min 2 Views Right  01/11/2015  CLINICAL DATA:  60 year old female with fall and right knee pain. EXAM: RIGHT FEMUR 2 VIEWS COMPARISON:  None. FINDINGS: There is a displaced spiral fracture of the distal third of the right femur with approximately 2 cm lateral displacement of the distal fracture fragment. The distal aspect of the fracture appears to extend to the distal femoral metadiaphysis posteriorly. Limited evaluation of the right knee does not demonstrate any dislocation. No significant joint effusion. IMPRESSION: Displaced spiral fracture of the distal right femur. Electronically Signed   By: Anner Crete M.D.   On: 01/11/2015 22:18    Positive ROS: All other systems have been reviewed and were otherwise negative with the exception of those mentioned in the HPI and as above.  Physical Exam: General: Alert, no acute distress Cardiovascular: No pedal edema Respiratory: No cyanosis, no use of accessory musculature GI: No organomegaly, abdomen is soft and non-tender Skin: No lesions in the area of chief complaint Neurologic: Sensation intact distally Psychiatric: Patient is competent for consent with normal mood and affect Lymphatic: No axillary or cervical lymphadenopathy  MUSCULOSKELETAL: Right leg is splinted with a long leg posterior splint.  Neurovascular status is good distally.  There is tenderness to palpation in the thigh.  Skin is intact.  No other injuries are noted.  Assessment: Spiral displaced lower right femoral shaft fracture  Plan: Operative fixation of fracture later today.    Park Breed, MD [phone]   01/12/2015 10:23 AM

## 2015-01-12 NOTE — H&P (Signed)
[facility] NAME: Janet Zuniga    MR#:  [id number]  DATE OF BIRTH:  Jan 08, 1955  DATE OF ADMISSION:  01/11/2015  PRIMARY CARE PHYSICIAN: [facility]   REQUESTING/REFERRING PHYSICIAN: Archie Balboa, MD  CHIEF COMPLAINT:   Chief Complaint  Patient presents with  . Knee Pain    HISTORY OF PRESENT ILLNESS:  Janet Zuniga  is a 60 y.o. female who presents with mechanical fall after slipping on ice and a subsequent spiral femur fracture. In the ED the patient was also noted to be hypokalemic. Orthopedics saw her and have scheduled her for surgical repair for fracture morning. Hospitalists were called for admission and comanagement of medical problems, as well as cardiac risk stratification.  PAST MEDICAL HISTORY:   Past Medical History  Diagnosis Date  . Arthritis   . Blood in stool   . Hepatitis C 2007  . History of blood transfusion   . Breast cancer ([place])   . Anxiety     PAST SURGICAL HISTORY:   Past Surgical History  Procedure Laterality Date  . Foot surgery  2000 and 2003  . Knee surgery Left 2004  . Breast lumpectomy Left 2010    SOCIAL HISTORY:   Social History  Substance Use Topics  . Smoking status: Current Every Day Smoker -- 1.00 packs/day    Types: Cigarettes  . Smokeless tobacco: Never Used  . Alcohol Use: Yes    FAMILY HISTORY:   Family History  Problem Relation Age of Onset  . Arthritis Mother   . Stroke Mother   . Heart disease Mother   . Hypertension Mother   . Hypertension Father   . Hypertension Brother   . Heart disease Maternal Aunt     DRUG ALLERGIES:   Allergies  Allergen Reactions  . Penicillins Rash and Other (See Comments)    Has patient had a PCN reaction causing immediate rash, facial/tongue/throat swelling, SOB or lightheadedness with hypotension: Yes Has patient had a PCN reaction causing severe rash involving mucus membranes or skin necrosis: No Has patient had  a PCN reaction that required hospitalization No Has patient had a PCN reaction occurring within the last 10 years: No If all of the above answers are "NO", then may proceed with Cephalosporin use.     MEDICATIONS AT HOME:   Prior to Admission medications   Medication Sig Start Date End Date Taking? Authorizing Provider  ALPRAZolam Duanne Moron) 1 MG tablet Take 1 mg by mouth 4 (four) times daily as needed for anxiety.    Yes Historical Provider, MD  busPIRone (BUSPAR) 5 MG tablet Take 5 mg by mouth 3 (three) times daily.   Yes Historical Provider, MD  carisoprodol (SOMA) 350 MG tablet TAKE ONE TABLET BY MOUTH THREE TIMES DAILY AS NEEDED FOR MUSCLE SPASMS 12/24/14  Yes Historical Provider, MD  FLUoxetine (PROZAC) 40 MG capsule Take 40 mg by mouth daily.    Yes Historical Provider, MD  oxyCODONE-acetaminophen (ROXICET) 5-325 MG per tablet Take 1 tablet by mouth every 4 (four) hours as needed for severe pain. Patient taking differently: Take 1 tablet by mouth every 8 (eight) hours as needed for moderate pain or severe pain.  05/24/14  Yes Nena Polio, MD  traZODone (DESYREL) 150 MG tablet Take 150 mg by mouth at bedtime.    Yes Historical Provider, MD  Oxycodone HCl 10 MG TABS Take 1 tablet (10 mg total) by mouth 3 (three) times daily.  Patient not taking: Reported on 01/11/2015 07/01/14   Pierce Crane Beers, PA-C  traMADol (ULTRAM) 50 MG tablet Take 1 tablet (50 mg total) by mouth every 6 (six) hours as needed for moderate pain. Patient not taking: Reported on 01/11/2015 09/16/14   Sable Feil, PA-C    REVIEW OF SYSTEMS:  Review of Systems  Constitutional: Negative for fever, chills, weight loss and malaise/fatigue.  HENT: Negative for ear pain, hearing loss and tinnitus.   Eyes: Negative for blurred vision, double vision, pain and redness.  Respiratory: Negative for cough, hemoptysis and shortness of breath.   Cardiovascular: Negative for chest pain, palpitations, orthopnea and leg swelling.    Gastrointestinal: Negative for nausea, vomiting, abdominal pain, diarrhea and constipation.  Genitourinary: Negative for dysuria, frequency and hematuria.  Musculoskeletal: Positive for joint pain (right leg) and falls. Negative for back pain and neck pain.  Skin:       No acne, rash, or lesions  Neurological: Negative for dizziness, tremors, focal weakness and weakness.  Endo/Heme/Allergies: Negative for polydipsia. Does not bruise/bleed easily.  Psychiatric/Behavioral: Negative for depression. The patient is not nervous/anxious and does not have insomnia.      VITAL SIGNS:   Filed Vitals:   01/11/15 2042  BP: 124/76  Pulse: 63  Temp: 97.8 F (36.6 C)  TempSrc: Oral  Resp: 18  Height: 5\' 1"  (1.549 m)  Weight: 67.132 kg (148 lb)  SpO2: 97%   Wt Readings from Last 3 Encounters:  01/11/15 67.132 kg (148 lb)  01/07/15 67.5 kg (148 lb 13 oz)  12/21/14 68.04 kg (150 lb)    PHYSICAL EXAMINATION:  Physical Exam  Vitals reviewed. Constitutional: She is oriented to person, place, and time. She appears well-developed and well-nourished. No distress.  HENT:  Head: Normocephalic and atraumatic.  Mouth/Throat: Oropharynx is clear and moist.  Eyes: Conjunctivae and EOM are normal. Pupils are equal, round, and reactive to light. No scleral icterus.  Neck: Normal range of motion. Neck supple. No JVD present. No thyromegaly present.  Cardiovascular: Normal rate, regular rhythm and intact distal pulses.  Exam reveals no gallop and no friction rub.   No murmur heard. Respiratory: Effort normal and breath sounds normal. No respiratory distress. She has no wheezes. She has no rales.  GI: Soft. Bowel sounds are normal. She exhibits no distension. There is no tenderness.  Musculoskeletal: Normal range of motion. She exhibits tenderness (Right leg). She exhibits no edema.  No arthritis, no gout  Lymphadenopathy:    She has no cervical adenopathy.  Neurological: She is alert and oriented to  person, place, and time. No cranial nerve deficit.  No dysarthria, no aphasia  Skin: Skin is warm and dry. No rash noted. No erythema.  Psychiatric: She has a normal mood and affect. Her behavior is normal. Judgment and thought content normal.    LABORATORY PANEL:   CBC  Recent Labs Lab 01/11/15 2154  WBC 8.6  HGB 14.9  HCT 44.5  PLT 212   ------------------------------------------------------------------------------------------------------------------  Chemistries   Recent Labs Lab 01/11/15 2154  NA 139  K 2.4*  CL 100*  CO2 28  GLUCOSE 93  BUN 15  CREATININE 0.58  CALCIUM 9.0   ------------------------------------------------------------------------------------------------------------------  Cardiac Enzymes No results for input(s): TROPONINI in the last 168 hours. ------------------------------------------------------------------------------------------------------------------  RADIOLOGY:  Dg Chest 1 View  01/11/2015  CLINICAL DATA:  60 year old female with fall and right knee pain. EXAM: CHEST 1 VIEW COMPARISON:  Chest radiograph dated 09/24/2014 FINDINGS: The heart size  and mediastinal contours are within normal limits. Both lungs are clear. The visualized skeletal structures are unremarkable. IMPRESSION: No active disease. Electronically Signed   By: Anner Crete M.D.   On: 01/11/2015 22:19   Dg Femur, Min 2 Views Right  01/11/2015  CLINICAL DATA:  60 year old female with fall and right knee pain. EXAM: RIGHT FEMUR 2 VIEWS COMPARISON:  None. FINDINGS: There is a displaced spiral fracture of the distal third of the right femur with approximately 2 cm lateral displacement of the distal fracture fragment. The distal aspect of the fracture appears to extend to the distal femoral metadiaphysis posteriorly. Limited evaluation of the right knee does not demonstrate any dislocation. No significant joint effusion. IMPRESSION: Displaced spiral fracture of the distal right  femur. Electronically Signed   By: Anner Crete M.D.   On: 01/11/2015 22:18    EKG:   Orders placed or performed during the hospital encounter of 09/24/14  . ED EKG  . ED EKG  . EKG 12-Lead  . EKG 12-Lead  . EKG    IMPRESSION AND PLAN:  Principal Problem:   Displaced spiral fracture of shaft of right femur ([place]) - surgical repair by orthopedics. Pain control. Cardiac risk stratification as below. Patient does not have any of the 6 risk factors.   Active Problems:   Ankylosing spondylitis of lumbosacral region [facility]) - patient will already be on pain control for her fracture as above.   Depression - continue home antidepressants   Anxiety - continue home anxiolytics   Hypokalemia - replete and monitor  All the records are reviewed and case discussed with ED provider. Management plans discussed with the patient and/or family.  DVT PROPHYLAXIS: Mechanical only  GI PROPHYLAXIS: None  ADMISSION STATUS: Inpatient  CODE STATUS:     Code Status Orders        Start     Ordered   01/11/15 2247  Full code   Continuous     01/11/15 2249    Full Code  TOTAL TIME TAKING CARE OF THIS PATIENT: 40 minutes.    Janet Zuniga [place] 01/12/2015, 12:58 AM  [facility]  Office  [phone]  CC: Primary care physician; [facility]

## 2015-01-12 NOTE — Anesthesia Preprocedure Evaluation (Signed)
Anesthesia Evaluation  Patient identified by MRN, date of birth, ID band Patient awake    Reviewed: Allergy & Precautions, H&P , NPO status , Patient's Chart, lab work & pertinent test results, reviewed documented beta blocker date and time   History of Anesthesia Complications Negative for: history of anesthetic complications  Airway Mallampati: II  TM Distance: >3 FB Neck ROM: full    Dental no notable dental hx.    Pulmonary neg pulmonary ROS, Current Smoker,    Pulmonary exam normal breath sounds clear to auscultation       Cardiovascular Exercise Tolerance: Good negative cardio ROS Normal cardiovascular exam Rhythm:regular Rate:Normal     Neuro/Psych PSYCHIATRIC DISORDERS (Depression) negative neurological ROS     GI/Hepatic negative GI ROS, (+) Hepatitis -, C  Endo/Other  negative endocrine ROS  Renal/GU negative Renal ROS  negative genitourinary   Musculoskeletal   Abdominal   Peds  Hematology negative hematology ROS (+)   Anesthesia Other Findings Past Medical History:   Arthritis                                                    Blood in stool                                               Hepatitis C                                     2007         History of blood transfusion                                 Breast cancer (HCC)                                          Anxiety          Ankylosing Spondylitis                                               Reproductive/Obstetrics negative OB ROS                             Anesthesia Physical Anesthesia Plan  ASA: II  Anesthesia Plan: Spinal   Post-op Pain Management:    Induction:   Airway Management Planned:   Additional Equipment:   Intra-op Plan:   Post-operative Plan:   Informed Consent: I have reviewed the patients History and Physical, chart, labs and discussed the procedure including the risks, benefits  and alternatives for the proposed anesthesia with the patient or authorized representative who has indicated his/her understanding and acceptance.   Dental Advisory Given  Plan Discussed with: Anesthesiologist, CRNA and Surgeon  Anesthesia Plan Comments:         Anesthesia Quick Evaluation

## 2015-01-12 NOTE — Progress Notes (Signed)
Went to see x 2 but pt still in OR

## 2015-01-12 NOTE — Op Note (Signed)
[id number]  4:11 PM  PATIENT:  Janet Zuniga    PRE-OPERATIVE DIAGNOSIS:  Distal,spiral, displaced right femur fracture  POST-OPERATIVE DIAGNOSIS:  Same  PROCEDURE:  INTRAMEDULLARY (IM) NAIL RIGHT FEMUR SURGEON:  Park Breed, MD   ANESTHESIA:   Spinal  PREOPERATIVE INDICATIONS:  Janet Zuniga is a  60 y.o. female with a diagnosis of right femur fracture who elected for surgical management after discussion of the risks and benefits of surgical treatment.    The risks benefits and alternatives were discussed with the patient preoperatively including but not limited to the risks of infection, bleeding, nerve injury, cardiopulmonary complications, the need for revision surgery, among others, and the patient was willing to proceed.  OPERATIVE IMPLANTS: Synthes lateral entry intramedullary femoral rod (340 mm/11 mm)          1 Dall Miles cable                       1 proximal, 2 distal locking screws  OPERATIVE FINDINGS: Patient had a markedly displaced rotated spiral fracture of the distal third of the right femur  EBL: [id number] cc  COMPLICATIONS:   None  OPERATIVE PROCEDURE: The patient was brought to the operating room and underwent spinal anesthesia.  Placed on the fracture table with the nonoperative leg flexed and abducted and the operative leg placed in traction.  Fluoroscopy showed satisfactory positioning although there was some rotational abnormality distally.  The hip and thigh were prepped and draped in sterile fashion.  A longitudinal incision was made from the tip of the greater trochanter proximally for several centimeters.  Sharp dissection was carried out through subcutaneous tissue and fascia.  A guidepin was inserted through the tip of the trochanter and advanced.  Fluoroscopy confirmed its good position.  The large reamer was used to open the proximal femur and a guide pin advanced distally.  Due to residual displacement, it was impossible to get the guide pin to pass easily.   Therefore, a distal lateral incision was made with dissection carried out sharply through subcutaneous tissue and fascia.  The muscle was divided and the fracture site exposed.  Irrigation and debridement was carried out of the fracture site and the leg was rotated to provide better positioning.  A [place] claw clamp was affixed to hold the fracture in virtually anatomic position.  The guidepin was then advanced easily across the fracture site down to the lower end of the femur at the level of the patella.  This measured 345 mm in length.  The shaft was then sequentially reamed to 12.5 mm.  A 340 mm/11 mm lateral entry trochanteric femoral rod was inserted and seated fully.  2 distal locking screws were inserted under fluoroscopic control.  A single proximal screw was then inserted.  A Dall-Miles cable was then passed around the femur at the midportion of the fracture site and tightened securely.  The clamp was removed and the fracture construct remained stable.  Fluoroscopy showed the hardware to be in excellent position and the fracture to be anatomically reduced.  Wounds were irrigated.  Deep fascia was closed with #2 Quill and subcutaneous tissue closed with number 0 Quill.  The wounds were closed with staples.  Aquacel dressings were applied.  Patient's leg was taken out of traction and a good alignment and motion.  Patient was transferred to a hospital bed and taken to recovery in good condition.  Park Breed, MD

## 2015-01-12 NOTE — Anesthesia Procedure Notes (Signed)
Spinal Patient location during procedure: OR Start time: 01/12/2015 1:20 PM End time: 01/12/2015 1:32 PM Staffing Anesthesiologist: Martha Clan Resident/CRNA: Johnna Acosta Performed by: anesthesiologist and resident/CRNA  Preanesthetic Checklist Completed: patient identified, site marked, surgical consent, pre-op evaluation, timeout performed, IV checked, risks and benefits discussed and monitors and equipment checked Spinal Block Patient position: sitting Prep: ChloraPrep Patient monitoring: heart rate, continuous pulse ox, blood pressure and cardiac monitor Approach: midline Location: L4-5 Injection technique: single-shot Needle Needle type: Whitacre and Introducer  Needle gauge: 25 G Needle length: 9 cm Additional Notes Negative paresthesia. Negative blood return. Positive free-flowing CSF. Expiration date of kit checked and confirmed. Patient tolerated procedure well, without complications.

## 2015-01-12 NOTE — Care Management Note (Signed)
Case Management Note  Patient Details  Name: IKESHA SILLER MRN: [id number] Date of Birth: [birth year]  Subjective/Objective:                  Met with patient prior to surgery today. She has refused SNF. She plans to return home at discharge. She lives alone- with a dog that she states her mother is caring for. She was independent prior to this injury and therefore has no DME in the home. Her PCP is Dr. Candiss Norse with [facility]. She uses [facility] for Rx.  [phone]. She states she "has friends that can help her when she gets home". She did not indicate that transportation would be an issue.   Action/Plan: List of home health care agencies left at patient's bedside. RNCM will continue to follow.   Expected Discharge Date:                  Expected Discharge Plan:     In-House Referral:     Discharge planning Services  CM Consult  Post Acute Care Choice:  Home Health, Durable Medical Equipment Choice offered to:  Patient  DME Arranged:    DME Agency:     HH Arranged:    [place] Agency:     Status of Service:  In process, will continue to follow  Medicare Important Message Given:    Date Medicare IM Given:    Medicare IM give by:    Date Additional Medicare IM Given:    Additional Medicare Important Message give by:     If discussed at [place] of Stay Meetings, dates discussed:    Additional Comments:  Marshell Garfinkel, RN 01/12/2015, 9:21 AM

## 2015-01-12 NOTE — Progress Notes (Signed)
Per CRNA, Janet Zuniga, will be spinal - she took Cleocin and Cefazolin that floor sent up with her to OR room Sacral dressing sent with patient to OR

## 2015-01-12 NOTE — Transfer of Care (Signed)
Immediate Anesthesia Transfer of Care Note  Patient: Janet Zuniga  Procedure(s) Performed: Procedure(s): INTRAMEDULLARY (IM) NAIL INTERTROCHANTRIC (Right)  Patient Location: PACU  Anesthesia Type:Spinal  Level of Consciousness: awake, alert  and oriented  Airway & Oxygen Therapy: Patient Spontanous Breathing and Patient connected to face mask oxygen  Post-op Assessment: Report given to RN and Post -op Vital signs reviewed and stable  Post vital signs: Reviewed and stable  Last Vitals:  Filed Vitals:   01/12/15 0818 01/12/15 1228  BP: 157/75 140/89  Pulse: 74 71  Temp: 36.7 C 35.7 C  Resp: 18 16    Complications: No apparent anesthesia complications

## 2015-01-13 LAB — BASIC METABOLIC PANEL
Anion gap: 7 (ref 5–15)
BUN: 9 mg/dL (ref 6–20)
CALCIUM: 7.7 mg/dL — AB (ref 8.9–10.3)
CO2: 26 mmol/L (ref 22–32)
Chloride: 100 mmol/L — ABNORMAL LOW (ref 101–111)
Creatinine, Ser: 0.61 mg/dL (ref 0.44–1.00)
GFR calc Af Amer: >60 mL/min (ref >60)
GLUCOSE: 130 mg/dL — AB (ref 65–99)
POTASSIUM: 3.7 mmol/L (ref 3.5–5.1)
Sodium: 133 mmol/L — ABNORMAL LOW (ref 135–145)

## 2015-01-13 LAB — CBC
HCT: 27 % — ABNORMAL LOW (ref 35.0–47.0)
Hemoglobin: 9.2 g/dL — ABNORMAL LOW (ref 12.0–16.0)
MCH: 34.2 pg — AB (ref 26.0–34.0)
MCHC: 34.2 g/dL (ref 32.0–36.0)
MCV: 100 fL (ref 80.0–100.0)
PLATELETS: 142 10*3/uL — AB (ref 150–440)
RBC: 2.7 MIL/uL — ABNORMAL LOW (ref 3.80–5.20)
RDW: 13.8 % (ref 11.5–14.5)
WBC: 10.8 10*3/uL (ref 3.6–11.0)

## 2015-01-13 MED ORDER — SODIUM CHLORIDE 0.45 % IV BOLUS
250.0000 mL | Freq: Once | INTRAVENOUS | Status: AC
  Administered 2015-01-13: 250 mL via INTRAVENOUS

## 2015-01-13 MED ORDER — MORPHINE SULFATE (PF) 2 MG/ML IV SOLN: 2.0000 mg | Freq: Four times a day (QID) | INTRAVENOUS | Status: DC | PRN

## 2015-01-13 MED ORDER — OXYCODONE-ACETAMINOPHEN 5-325 MG PO TABS
1.0000 | ORAL_TABLET | Freq: Four times a day (QID) | ORAL | Status: DC | PRN
  Administered 2015-01-13 – 2015-01-14 (×4): 2 via ORAL
  Filled 2015-01-13 (×4): qty 2

## 2015-01-13 NOTE — Care Management (Signed)
Per Dr. Sabra Heck patient will need SNF and that he talked to her and she agrees now. PT evaluation pending. I have updated CSW and DR. Sabra Heck that patient will need HHPT orders and DME orders if able to return home. RNCM will continue to follow.

## 2015-01-13 NOTE — Progress Notes (Signed)
Spoke with Dr.Marissia Blackham regarding pt's blood pressure of 85/50 and 88/62. Order received for 0.45%: 250 ml bolus iv then 77ml/hr.

## 2015-01-13 NOTE — Progress Notes (Signed)
Pt's blood pressure 85/50 post iv bolus.pt is asymptomatic, spoke with Dr.Tauriel Scronce with pt's low blood pressure of 82/50. No new orders given. Will continue to monitor the patient.

## 2015-01-13 NOTE — Anesthesia Postprocedure Evaluation (Signed)
Anesthesia Post Note  Patient: Janet Zuniga  Procedure(s) Performed: Procedure(s) (LRB): INTRAMEDULLARY (IM) NAIL INTERTROCHANTRIC (Right)  Patient location during evaluation: Nursing Unit Anesthesia Type: Spinal Level of consciousness: awake and alert and oriented Pain management: pain level controlled Vital Signs Assessment: post-procedure vital signs reviewed and stable Respiratory status: spontaneous breathing and nonlabored ventilation Cardiovascular status: blood pressure returned to baseline and stable Postop Assessment: no headache, no backache and patient able to bend at knees Anesthetic complications: no    Last Vitals:  Filed Vitals:   01/13/15 0116 01/13/15 0533  BP: 103/55 90/54  Pulse: 92 91  Temp: 37.3 C 37.5 C  Resp: 18 18    Last Pain:  Filed Vitals:   01/13/15 0614  PainSc: 6                  Proofreader

## 2015-01-13 NOTE — Clinical Social Work Placement (Signed)
   CLINICAL SOCIAL WORK PLACEMENT  NOTE  Date:  01/13/2015  Patient Details  Name: Janet Zuniga MRN: [id number] Date of Birth: 24-Dec-1955  Clinical Social Work is seeking post-discharge placement for this patient at the [place] level of care (*CSW will initial, date and re-position this form in  chart as items are completed):  Yes   Patient/family provided with [place] Work Department's list of facilities offering this level of care within the geographic area requested by the patient (or if unable, by the patient's family).  Yes   Patient/family informed of their freedom to choose among providers that offer the needed level of care, that participate in Medicare, Medicaid or managed care program needed by the patient, have an available bed and are willing to accept the patient.  Yes   Patient/family informed of [facility]'s ownership interest in [facility] and [facility], as well as of the fact that they are under no obligation to receive care at these facilities.  PASRR submitted to EDS on 01/13/15     PASRR number received on       Existing PASRR number confirmed on       FL2 transmitted to all facilities in geographic area requested by pt/family on 01/13/15     FL2 transmitted to all facilities within larger geographic area on       Patient informed that his/her managed care company has contracts with or will negotiate with certain facilities, including the following:            Patient/family informed of bed offers received.  Patient chooses bed at       Physician recommends and patient chooses bed at      Patient to be transferred to   on  .  Patient to be transferred to facility by       Patient family notified on   of transfer.  Name of family member notified:        PHYSICIAN       Additional Comment:    _______________________________________________ Darden Dates, LCSW 01/13/2015, 2:20 PM

## 2015-01-13 NOTE — Progress Notes (Signed)
OT Cancellation Note  Patient Details Name: Janet Zuniga TO MRN: [id number] DOB: 10-May-1955   Cancelled Treatment:    Reason Eval/Treat Not Completed: Medical issues which prohibited therapy. Patient with blood pressure 88/62. Will hold Occupational Therapy   Sharon Mt 01/13/2015, 9:27 AM

## 2015-01-13 NOTE — Clinical Social Work Note (Signed)
Clinical Social Work Assessment  Patient Details  Name: Janet Zuniga MRN: [id number] Date of Birth: [birth year]  Date of referral:  01/13/15               Reason for consult:  Facility Placement                Permission sought to share information with:  Family Supports Permission granted to share information::  Yes, Verbal Permission Granted   Housing/Transportation Living arrangements for the past 2 months:  Single Family Home Source of Information:  Patient Patient Interpreter Needed:  None Criminal Activity/Legal Involvement Pertinent to Current Situation/Hospitalization:  No - Comment as needed Significant Relationships:  Other Family Members Lives with:  Self Do you feel safe going back to the place where you live?  Yes Need for family participation in patient care:  No (Coment)  Care giving concerns:  Pt lives alone   Facilities manager / plan:  CSW met with pt to address consult for New SNF placement. CSW introduced herself and explained role of social work. CSW also explained the process of discharging to SNF. PT is recommending SNF.   Pt expressed concern about paying for SNF. CSW explained that medicare would cover SNF for STR, however she may have copays. CSW will follow up regarding this matter.   Employment status:  Disabled (Comment on whether or not currently receiving Disability) Insurance information:  Managed Medicare PT Recommendations:  [place] / Referral to community resources:  [place]  Patient/Family's Response to care:  Pt was appreciative of CSW support.   Patient/Family's Understanding of and Emotional Response to Diagnosis, Current Treatment, and Prognosis:  Pt understands that she needs STR.   Emotional Assessment Appearance:  Appears older than stated age Attitude/Demeanor/Rapport:  Other (Appropriate) Affect (typically observed):  Flat Orientation:  Oriented to Self, Oriented to Place,  Oriented to  Time, Oriented to Situation Alcohol / Substance use:  Never Used Psych involvement (Current and /or in the community):  No (Comment)  Discharge Needs  Concerns to be addressed:   discharge needs Readmission within the last 30 days:  No Current discharge risk:  None Barriers to Discharge:  Continued Medical Work up   [employer], LCSW 01/13/2015, 4:50 PM

## 2015-01-13 NOTE — Evaluation (Signed)
Physical Therapy Evaluation Patient Details Name: Janet Zuniga MRN: [id number] DOB: [birth year] Today's Date: 01/13/2015   History of Present Illness  Patient is a pleasant 60 y/o female that fell on ice and sustained spiral fx of R hip. She has been hypotensive since admission to [facility].   Clinical Impression  Patient slipped on ice while trying to visit her friend and sustained R hip fracture. She has been hypotensive throughout the day and is currently on bedrest orders (confirmed with MD in room who stated to wait to attempt OOB until BP increased in PM session). Patient is able to complete bed level exercises and transfers with moderate assistance secondary to pain, however excellent participation noted. She became hypotensive in sitting (though not symptomatic) and thus all further mobility was deferred. Skilled PT services are indicated to address the above listed mobility deficits.     Follow Up Recommendations SNF    Equipment Recommendations  Rolling walker with 5" wheels;3in1 (PT)    Recommendations for Other Services       Precautions / Restrictions Precautions Precautions: Fall Restrictions Weight Bearing Restrictions: Yes RLE Weight Bearing: Partial weight bearing      Mobility  Bed Mobility Overal bed mobility: Needs Assistance Bed Mobility: Supine to Sit;Sit to Supine     Supine to sit: Mod assist Sit to supine: Mod assist   General bed mobility comments: Patient demonstrates LE weakness and difficulty with bring RLE over the threshold of the bed surface for bother supine <--> sit transfers. No loss of balance in sitting.   Transfers                 General transfer comment: Did not attempt due to low BP  Ambulation/Gait             General Gait Details: Did not attempt due to low BP   Stairs            Wheelchair Mobility    Modified Rankin (Stroke Patients Only)       Balance Overall balance assessment: No apparent balance  deficits (not formally assessed) (No deficits noted in sitting, though could not assess for long as she became quite hypotensive)                                           Pertinent Vitals/Pain Pain Assessment:  (Patient reports pain as mild, though asks for pain medications after session. RN notified. )    Home Living Family/patient expects to be discharged to:: Private residence Living Arrangements: Alone Available Help at Discharge: Friend(s);Available PRN/intermittently Type of Home: House Home Access: Stairs to enter Entrance Stairs-Rails: Left Entrance Stairs-Number of Steps: 4 Home Layout: One level        Prior Function Level of Independence: Independent         Comments: Patient has not fallen otherwise and uses no AD for ambulation assistance.      Hand Dominance        Extremity/Trunk Assessment   Upper Extremity Assessment: Overall WFL for tasks assessed           Lower Extremity Assessment: Generalized weakness (Patient is able to complete SLRs with minimal assistance, as well as other bed mobility. Unable to assess further due to concerns regarding blood pressure. )         Communication   Communication: No difficulties  Cognition Arousal/Alertness:  Awake/alert Behavior During Therapy: WFL for tasks assessed/performed Overall Cognitive Status: Within Functional Limits for tasks assessed                      General Comments      Exercises General Exercises - Lower Extremity Ankle Circles/Pumps: AROM;Both;10 reps Heel Slides: AROM;Both;10 reps Hip ABduction/ADduction: AROM;AAROM;Both;10 reps Straight Leg Raises: AROM;AAROM;Both;10 reps      Assessment/Plan    PT Assessment Patient needs continued PT services  PT Diagnosis Difficulty walking;Generalized weakness   PT Problem List Decreased strength;Decreased knowledge of use of DME;Decreased activity tolerance;Cardiopulmonary status limiting activity;Decreased  balance;Decreased mobility  PT Treatment Interventions DME instruction;Gait training;Stair training;Therapeutic activities;Therapeutic exercise;Balance training   PT Goals (Current goals can be found in the Care Plan section) Acute Rehab PT Goals Patient Stated Goal: To increase her mobility and return home.  PT Goal Formulation: With patient Time For Goal Achievement: 01/27/15 Potential to Achieve Goals: Fair    Frequency BID   Barriers to discharge Decreased caregiver support Patient lives alone and does not have 24 hour support.    Co-evaluation               End of Session Equipment Utilized During Treatment: Gait belt Activity Tolerance: Patient tolerated treatment well;Treatment limited secondary to medical complications (Comment) (Decreased BP in sitting) Patient left: in bed;with call bell/phone within reach;with bed alarm set Nurse Communication: Mobility status;Patient requests pain meds (Low BP)         Time: [id number] PT Time Calculation (min) (ACUTE ONLY): 21 min   Charges:   PT Evaluation $PT Eval Moderate Complexity: 1 Procedure PT Treatments $Therapeutic Exercise: 8-22 mins   PT G Codes:       Kerman Passey, PT, DPT    01/13/2015, 6:30 PM

## 2015-01-13 NOTE — Progress Notes (Signed)
Subjective: 1 Day Post-Op Procedure(s) (LRB): INTRAMEDULLARY (IM) NAIL INTERTROCHANTRIC (Right)    Patient reports pain as moderate. BP has been low overnight but she is relatively asymptomatic.  Alert and oriented.    Objective:   VITALS:   Filed Vitals:   01/13/15 0954 01/13/15 1030  BP: 86/48 82/50  Pulse: 81   Temp:    Resp:      Neurologically intact ABD soft Intact pulses distally Dorsiflexion/Plantar flexion intact Incision: scant drainage Compartment soft  LABS  Recent Labs  01/12/15 0914 01/12/15 2205 01/13/15 0334  HGB 12.4 10.1* 9.2*  HCT 36.6 30.4* 27.0*  WBC 10.1 14.6* 10.8  PLT 188 154 142*     Recent Labs  01/11/15 2154 01/12/15 0914 01/12/15 2205 01/13/15 0334  NA 139 136  --  133*  K 2.4* 3.7  --  3.7  BUN 15 12  --  9  CREATININE 0.58 0.50 0.66 0.61  GLUCOSE 93 99  --  130*     Recent Labs  01/11/15 2154 01/12/15 0914  INR 1.06 1.07     Assessment/Plan: 1 Day Post-Op Procedure(s) (LRB): INTRAMEDULLARY (IM) NAIL INTERTROCHANTRIC (Right)   Advance diet Up with therapy Discharge to SNF when stable

## 2015-01-13 NOTE — Progress Notes (Signed)
Physical Therapy Treatment Patient Details Name: Janet Zuniga MRN: [id number] DOB: 12/29/1955 Today's Date: 01/13/2015    History of Present Illness Patient is a pleasant 60 y/o female that fell on ice and sustained spiral fx of R hip. She has been hypotensive since admission to [facility].     PT Comments    Patient demonstrates improved BP allowing for increased mobility in this session. She demonstrates improved participation and bed mobility skills in this session. She continues to require physical assistance for bed mobility and OOB transfer as she demonstrates weakness in RLE. Patient is able to take 2 small side steps bilaterally with cuing for [facility] and use of UEs through the walker, she does not lose balance or have any buckling in these attempts. As patient's BP continues to improve and normalize, increased OOB ambulation seems reasonable given her performance in this session. She does continue to demonstrate RLE weakness and would be a good candidate for short term rehab when medically appropriate for discharge.   Follow Up Recommendations  SNF     Equipment Recommendations  Rolling walker with 5" wheels;3in1 (PT)    Recommendations for Other Services       Precautions / Restrictions Precautions Precautions: Fall Restrictions Weight Bearing Restrictions: Yes RLE Weight Bearing: Partial weight bearing Other Position/Activity Restrictions: Able to complete 10 SLRs with no assistance (minimal support under ankle)    Mobility  Bed Mobility Overal bed mobility: Needs Assistance Bed Mobility: Supine to Sit;Sit to Supine     Supine to sit: Min assist Sit to supine: Min assist   General bed mobility comments: Patient continues to require minimal assistance with bringing RLE on and off edge of bed secondary to weakness. She manages UEs through bed rail with no cuing required.   Transfers Overall transfer level: Needs assistance Equipment used: Rolling walker (2  wheeled) Transfers: Sit to/from Stand Sit to Stand: Min assist         General transfer comment: Patient asks to attempt mod I, she is unable to complete secondary to weakness. She requires min A x1 to complete transfer, no loss of balance noted.   Ambulation/Gait         Gait velocity: Side stepping x 2 bilaterally with RW completed with cga x 1 from PT and good technique for weightbearing (50% through arms) on RLE. No buckling noted.    General Gait Details: Did not attempt due to low BP    Stairs            Wheelchair Mobility    Modified Rankin (Stroke Patients Only)       Balance Overall balance assessment: Needs assistance Sitting-balance support: No upper extremity supported Sitting balance-Leahy Scale: Good     Standing balance support: Bilateral upper extremity supported Standing balance-Leahy Scale: Fair Standing balance comment: No balance deficits noted in standing, but patient demonstrates weakness with hesitancy to transfer weight through RLE in side stepping                    Cognition Arousal/Alertness: Awake/alert Behavior During Therapy: WFL for tasks assessed/performed Overall Cognitive Status: Within Functional Limits for tasks assessed                      Exercises General Exercises - Lower Extremity Ankle Circles/Pumps: AROM;Both;10 reps Gluteal Sets: AROM;Both;10 reps Long Arc Quad: AROM;Both;10 reps;AAROM Heel Slides: AROM;Both;10 reps Hip ABduction/ADduction: AROM;AAROM;Both;10 reps Straight Leg Raises: AROM;AAROM;Both;15 reps (Support given for RLE (minimal))  General Comments        Pertinent Vitals/Pain Pain Assessment:  (Patient reports pain as mild and has just received pain medications. She is eager to attempt mobility)    [place] expects to be discharged to:: Private residence Living Arrangements: Alone Available Help at Discharge: Friend(s);Available PRN/intermittently Type of  Home: House Home Access: Stairs to enter Entrance Stairs-Rails: Left Home Layout: One level        Prior Function Level of Independence: Independent      Comments: Patient has not fallen otherwise and uses no AD for ambulation assistance.    PT Goals (current goals can now be found in the care plan section) Acute Rehab PT Goals Patient Stated Goal: To increase her mobility and return home.  PT Goal Formulation: With patient Time For Goal Achievement: 01/27/15 Potential to Achieve Goals: Good Progress towards PT goals: Progressing toward goals    Frequency  BID    PT Plan Current plan remains appropriate    Co-evaluation             End of Session Equipment Utilized During Treatment: Gait belt Activity Tolerance: Patient tolerated treatment well Patient left: in bed;with call bell/phone within reach;with bed alarm set     Time: [id number] PT Time Calculation (min) (ACUTE ONLY): 18 min  Charges:  $Therapeutic Exercise: 8-22 mins                    G Codes:      Kerman Passey, PT, DPT    01/13/2015, 6:39 PM

## 2015-01-13 NOTE — Progress Notes (Signed)
Call to the room by patient, right hip dressing noted with bloody drainage and leaking. Right hip dressing reinforced with abd pad and tape. Will continue to monitor the patient.

## 2015-01-13 NOTE — NC FL2 (Signed)
Industry LEVEL OF CARE SCREENING TOOL     IDENTIFICATION  Patient Name: Janet Zuniga Birthdate: March 19, 1955 Sex: female Admission Date (Current Location): 01/11/2015  [facility] and [place] Number:  Engineering geologist and Address:  [facility], [address] [place], [place] [address]      Provider Number: [id number]  Attending Physician Name and Address:  Fritzi Mandes, MD  Relative Name and Phone Number:       Current Level of Care: Hospital Recommended Level of Care: [place] Prior Approval Number:    Date Approved/Denied:   PASRR Number:    Discharge Plan: SNF    Current Diagnoses: Patient Active Problem List   Diagnosis Date Noted  . Displaced spiral fracture of shaft of right femur ([place]) 01/12/2015  . Anxiety 01/12/2015  . Hypokalemia 01/12/2015  . Major depression ([place]) 10/27/2014  . Substance induced mood disorder ([place]) 10/27/2014  . History of breast cancer in female 12/23/2013  . Chronic pain 12/23/2013  . Ankylosing spondylitis of lumbosacral region ([place]) 12/23/2013  . Depression 12/23/2013    Orientation RESPIRATION BLADDER Height & Weight    Self, Time, Situation, Place  Normal Continent 5\' 1"  (154.9 cm) 148 lbs.  BEHAVIORAL SYMPTOMS/MOOD NEUROLOGICAL BOWEL NUTRITION STATUS      Continent Diet (Regular Diet)  AMBULATORY STATUS COMMUNICATION OF NEEDS Skin   Limited Assist Verbally Surgical wounds                       Personal Care Assistance Level of Assistance  Bathing, Feeding, Dressing Bathing Assistance: Limited assistance Feeding assistance: Limited assistance Dressing Assistance: Limited assistance     Functional Limitations Info  Sight, Hearing, Speech Sight Info: Adequate Hearing Info: Adequate Speech Info: Adequate    SPECIAL CARE FACTORS FREQUENCY  PT (By licensed PT)     PT Frequency: 5              Contractures      Additional Factors Info  Code Status,  Allergies Code Status Info: Full Code Allergies Info: Norco, Penicillins           Current Medications (01/13/2015):  This is the current hospital active medication list Current Facility-Administered Medications  Medication Dose Route Frequency Provider Last Rate Last Dose  . 0.45 % sodium chloride infusion   Intravenous Continuous Fritzi Mandes, MD 125 mL/hr at 01/13/15 1118    . acetaminophen (TYLENOL) tablet 650 mg  650 mg Oral Q6H PRN Earnestine Leys, MD       Or  . acetaminophen (TYLENOL) suppository 650 mg  650 mg Rectal Q6H PRN Earnestine Leys, MD      . acetaminophen (TYLENOL) tablet 1,000 mg  1,000 mg Oral 4 times per day Earnestine Leys, MD   1,000 mg at 01/12/15 2255  . ALPRAZolam Duanne Moron) tablet 1 mg  1 mg Oral QID PRN Lance Coon, MD   1 mg at 01/13/15 0931  . alum & mag hydroxide-simeth (MAALOX/MYLANTA) 200-200-20 MG/5ML suspension 30 mL  30 mL Oral Q4H PRN Earnestine Leys, MD      . bisacodyl (DULCOLAX) suppository 10 mg  10 mg Rectal Daily PRN Earnestine Leys, MD      . busPIRone (BUSPAR) tablet 5 mg  5 mg Oral TID Lance Coon, MD   5 mg at 01/12/15 2200  . celecoxib (CELEBREX) capsule 200 mg  200 mg Oral Daily Earnestine Leys, MD   200 mg at 01/12/15 2136  .  clindamycin (CLEOCIN) IVPB 600 mg  600 mg Intravenous 3 times per day Earnestine Leys, MD   600 mg at 01/13/15 1357  . docusate sodium (COLACE) capsule 100 mg  100 mg Oral BID Fritzi Mandes, MD   100 mg at 01/13/15 0926  . enoxaparin (LOVENOX) injection 30 mg  30 mg Subcutaneous Q24H Earnestine Leys, MD   30 mg at 01/13/15 0810  . ferrous sulfate tablet 325 mg  325 mg Oral Q breakfast Earnestine Leys, MD   325 mg at 01/13/15 0810  . FLUoxetine (PROZAC) capsule 40 mg  40 mg Oral Daily Lance Coon, MD   40 mg at 01/13/15 0926  . lactated ringers infusion   Intravenous Continuous Martha Clan, MD 50 mL/hr at 01/12/15 1930    . magnesium hydroxide (MILK OF MAGNESIA) suspension 30 mL  30 mL Oral Daily PRN Earnestine Leys, MD      .  menthol-cetylpyridinium (CEPACOL) lozenge 3 mg  1 lozenge Oral PRN Earnestine Leys, MD       Or  . phenol (CHLORASEPTIC) mouth spray 1 spray  1 spray Mouth/Throat PRN Earnestine Leys, MD      . methocarbamol (ROBAXIN) tablet 500 mg  500 mg Oral Q6H PRN Earnestine Leys, MD       Or  . methocarbamol (ROBAXIN) 500 mg in dextrose 5 % 50 mL IVPB  500 mg Intravenous Q6H PRN Earnestine Leys, MD      . metoCLOPramide (REGLAN) tablet 5-10 mg  5-10 mg Oral Q8H PRN Earnestine Leys, MD       Or  . metoCLOPramide (REGLAN) injection 5-10 mg  5-10 mg Intravenous Q8H PRN Earnestine Leys, MD      . morphine 2 MG/ML injection 2 mg  2 mg Intravenous Q6H PRN Fritzi Mandes, MD      . ondansetron (ZOFRAN) tablet 4 mg  4 mg Oral Q6H PRN Earnestine Leys, MD       Or  . ondansetron [facility]) injection 4 mg  4 mg Intravenous Q6H PRN Earnestine Leys, MD      . oxyCODONE-acetaminophen (PERCOCET/ROXICET) 5-325 MG per tablet 1-2 tablet  1-2 tablet Oral Q6H PRN Fritzi Mandes, MD      . [facility]) tablet 8.6 mg  1 tablet Oral BID Earnestine Leys, MD   8.6 mg at 01/12/15 2127  . sodium phosphate (FLEET) 7-19 GM/118ML enema 1 enema  1 enema Rectal Once PRN Earnestine Leys, MD      . traZODone (DESYREL) tablet 150 mg  150 mg Oral QHS Lance Coon, MD   150 mg at 01/12/15 0100     Discharge Medications: Please see discharge summary for a list of discharge medications.  Relevant Imaging Results:  Relevant Lab Results:   Additional Information SSN:  [id number]  Darden Dates, LCSW

## 2015-01-14 ENCOUNTER — Encounter: Payer: Self-pay | Admitting: Specialist

## 2015-01-14 LAB — BASIC METABOLIC PANEL
Anion gap: 3 — ABNORMAL LOW (ref 5–15)
BUN: 9 mg/dL (ref 6–20)
CHLORIDE: 106 mmol/L (ref 101–111)
CO2: 28 mmol/L (ref 22–32)
Calcium: 7.8 mg/dL — ABNORMAL LOW (ref 8.9–10.3)
Creatinine, Ser: 0.5 mg/dL (ref 0.44–1.00)
GFR calc non Af Amer: >60 mL/min (ref >60)
Glucose, Bld: 120 mg/dL — ABNORMAL HIGH (ref 65–99)
POTASSIUM: 3.6 mmol/L (ref 3.5–5.1)
SODIUM: 137 mmol/L (ref 135–145)

## 2015-01-14 LAB — CBC
HEMATOCRIT: 23.3 % — AB (ref 35.0–47.0)
HEMOGLOBIN: 7.9 g/dL — AB (ref 12.0–16.0)
MCH: 34.2 pg — AB (ref 26.0–34.0)
MCHC: 34 g/dL (ref 32.0–36.0)
MCV: 100.6 fL — ABNORMAL HIGH (ref 80.0–100.0)
Platelets: 121 10*3/uL — ABNORMAL LOW (ref 150–440)
RBC: 2.31 MIL/uL — AB (ref 3.80–5.20)
RDW: 13.9 % (ref 11.5–14.5)
WBC: 7.3 10*3/uL (ref 3.6–11.0)

## 2015-01-14 MED ORDER — CELECOXIB 200 MG PO CAPS: 200.0000 mg | ORAL_CAPSULE | Freq: Every day | ORAL | Status: DC

## 2015-01-14 MED ORDER — ASPIRIN EC 325 MG PO TBEC: 325.0000 mg | DELAYED_RELEASE_TABLET | Freq: Two times a day (BID) | ORAL | Status: DC

## 2015-01-14 MED ORDER — OXYCODONE-ACETAMINOPHEN 5-325 MG PO TABS: 1.0000 | ORAL_TABLET | Freq: Three times a day (TID) | ORAL | Status: DC | PRN

## 2015-01-14 MED ORDER — FERROUS SULFATE 325 (65 FE) MG PO TABS: 325.0000 mg | ORAL_TABLET | Freq: Every day | ORAL | Status: DC

## 2015-01-14 MED ORDER — ASPIRIN EC 325 MG PO TBEC: 325.0000 mg | DELAYED_RELEASE_TABLET | Freq: Every day | ORAL | Status: DC

## 2015-01-14 NOTE — Discharge Instructions (Signed)
PT

## 2015-01-14 NOTE — Progress Notes (Signed)
Physical Therapy Treatment Patient Details Name: Janet Zuniga MRN: [id number] DOB: Mar 06, 1955 Today's Date: 01/14/2015    History of Present Illness Patient is a pleasant 60 y/o female that fell on ice and sustained spiral fx of R hip. She had been hypotensive yesterday delaying evaluation. BP much better today.    PT Comments    Pt with good tolerance to activity this afternoon, CGA supine to sit with add'l time and HOB elevated.  Performed sit to stand with RW CGA with cues for sequencing -extending RLE and PHP as pt placing hands on front of RW.  She was able to transfer to recliner chair ambulating approx 3 ft with step to pattern.  Upon sitting pt had episode of incontinence, pt was able to stand with RW and supervision as PTA cleaned/changed pt.  Pt was able to maintain F+ static balance with cues to maintain erect posture.  Standing tolerance approx 2 min. Upon sitting commenced seated therex as noted.     Follow Up Recommendations  SNF     Equipment Recommendations  Rolling walker with 5" wheels    Recommendations for Other Services       Precautions / Restrictions Precautions Precautions: Fall Restrictions Weight Bearing Restrictions: Yes RLE Weight Bearing: Partial weight bearing    Mobility  Bed Mobility Overal bed mobility: Needs Assistance Bed Mobility: Supine to Sit     Supine to sit: Min guard (with add'l time and HOB elevated)     General bed mobility comments: Pt performed supine to sit with add'l trf and features  Transfers Overall transfer level: Needs assistance Equipment used: Rolling walker (2 wheeled) Transfers: Sit to/from Stand Sit to Stand: Min assist         General transfer comment: Pt with cues for PHP and sequencing, add'l time required, cues for erect posture  Ambulation/Gait Ambulation/Gait assistance: Min guard;Min assist Ambulation Distance (Feet): 3 Feet Assistive device: Rolling walker (2 wheeled) Gait Pattern/deviations:  Step-to pattern;Decreased weight shift to right     General Gait Details: Small shuffling steps, with step to pattern, cues for erect posture d/f forward flexed posture   Stairs            Wheelchair Mobility    Modified Rankin (Stroke Patients Only)       Balance Overall balance assessment: Needs assistance Sitting-balance support: No upper extremity supported Sitting balance-Leahy Scale: Good     Standing balance support: Bilateral upper extremity supported Standing balance-Leahy Scale: Fair Standing balance comment: Pt maintained F posture with standing actiities                    Cognition Arousal/Alertness: Awake/alert Behavior During Therapy: WFL for tasks assessed/performed Overall Cognitive Status: Within Functional Limits for tasks assessed                      Exercises General Exercises - Lower Extremity Ankle Circles/Pumps: AROM;Both;15 reps Gluteal Sets: AROM;Both;10 reps Long Arc Quad: AROM;Right;15 reps Hip ABduction/ADduction: AROM;Right;15 reps    General Comments        Pertinent Vitals/Pain      Home Living Family/patient expects to be discharged to:: Skilled nursing facility Living Arrangements: Alone Available Help at Discharge: Friend(s) Type of Home: House Home Access: Stairs to enter Entrance Stairs-Rails: Left Home Layout: One level        Prior Function Level of Independence: Independent      Comments: with out assistive device   PT Goals (current  goals can now be found in the care plan section) Acute Rehab PT Goals Patient Stated Goal: to get better and get some rehab PT Goal Formulation: With patient Time For Goal Achievement: 01/27/15 Potential to Achieve Goals: Good Progress towards PT goals: Progressing toward goals    Frequency  BID    PT Plan Current plan remains appropriate    Co-evaluation             End of Session Equipment Utilized During Treatment: Gait belt Activity  Tolerance: Patient tolerated treatment well Patient left: in chair;with chair alarm set     Time: 1055-1130 PT Time Calculation (min) (ACUTE ONLY): 35 min  Charges:  $Therapeutic Exercise: 8-22 mins $Therapeutic Activity: 8-22 mins                    G Codes:      Myleen Brailsford 01-31-2015, 12:15 PM  Catrice Zuleta

## 2015-01-14 NOTE — Progress Notes (Signed)
Dr.Miller here to see patient. Notified MD of pt's hgb- 7.9. Okay per Dr.Miller to discharge patient to rehab today.

## 2015-01-14 NOTE — Progress Notes (Signed)
Spoke with Ander Purpura, [facility] rep at [phone], to notify of non-emergent EMS transport.  Auth notification reference given as [id number].   Service date range good from 01/14/15 - 04/14/15.   Gap exception requested to determine if services can be considered at an in-network level.

## 2015-01-14 NOTE — Evaluation (Signed)
Occupational Therapy Evaluation Patient Details Name: Janet Zuniga MRN: [id number] DOB: [birth year] Today's Date: 01/14/2015    History of Present Illness Patient is a  60 year old female who fell on ice and suffered a spiral fx of R hip. She had been hypotensive yesterday delaying evaluation. BP much better today.   Clinical Impression   This patient is a 60 year old female who came to [facility] after a fall on ice suffering a R hip fracture. She received an open reduction with internal fixation repair. She lives in a one story home with alone, She shows deficits in pain, mobility and activities of daily living. She had been independent with ADL and functional mobility with out assistive device.  She now requires assist and would benefit from Occupational Therapy for ADL/functional mobility training.     Follow Up Recommendations  SNF    Equipment Recommendations       Recommendations for Other Services       Precautions / Restrictions Precautions Precautions: Fall Restrictions Weight Bearing Restrictions: Yes RLE Weight Bearing: Partial weight bearing      Mobility Bed Mobility                  Transfers                      Balance                                            ADL                                         General ADL Comments: Patient had been independent. Patient practiced techniques for lower body dressing. She was unable to reach her right foot to perform lower body dressing. Patient practiced technique using hip kit to perform lower body dressing. She needed minimal to moderate assist to Donned/doffed socks and pants to knees with set up and verbal cues for safety and technique.      Vision     Perception     Praxis      Pertinent Vitals/Pain       Hand Dominance     Extremity/Trunk Assessment Upper Extremity Assessment Upper Extremity Assessment: Overall WFL for  tasks assessed   Lower Extremity Assessment Lower Extremity Assessment: Defer to PT evaluation       Communication Communication Communication: No difficulties   Cognition Arousal/Alertness: Awake/alert Behavior During Therapy: WFL for tasks assessed/performed Overall Cognitive Status: Within Functional Limits for tasks assessed                     General Comments       Exercises       Shoulder Instructions      Home Living Family/patient expects to be discharged to:: Skilled nursing facility Living Arrangements: Alone Available Help at Discharge: Friend(s) Type of Home: House Home Access: Stairs to enter [employer] of Steps: 4 Entrance Stairs-Rails: Left Home Layout: One level                          Prior Functioning/Environment Level of Independence: Independent        Comments: with out assistive device  OT Diagnosis: Acute pain   OT Problem List: Decreased range of motion;Decreased activity tolerance;Decreased knowledge of use of DME or AE;Decreased knowledge of precautions;Pain   OT Treatment/Interventions: Self-care/ADL training    OT Goals(Current goals can be found in the care plan section) Acute Rehab OT Goals Patient Stated Goal: to get better and get some rehab OT Goal Formulation: With patient Time For Goal Achievement: 01/28/15 Potential to Achieve Goals: Good  OT Frequency: Min 1X/week   Barriers to D/C:            Co-evaluation              End of Session Equipment Utilized During Treatment: Gait belt (hip kit)  Activity Tolerance: Patient limited by pain Patient left: in chair (with PT)   Time: 2111-7356 OT Time Calculation (min): 21 min Charges:  OT General Charges $OT Visit: 1 Procedure OT Evaluation $OT Eval Low Complexity: 1 Procedure OT Treatments $Self Care/Home Management : 8-22 mins G-Codes:    Myrene Galas, MS/OTR/L  01/14/2015, 11:14 AM

## 2015-01-14 NOTE — Progress Notes (Signed)
Dr.Debhora Titus here to see patient- discussing plan of care for discharge. Order to stop iv fluids.

## 2015-01-14 NOTE — Progress Notes (Signed)
[place] at [place] NAME: Janet Zuniga    MR#:  [id number]  DATE OF BIRTH:  05-Nov-1955  SUBJECTIVE:   Leg pain.  REVIEW OF SYSTEMS:   Review of Systems  Constitutional: Negative for fever, chills and weight loss.  HENT: Negative for ear discharge, ear pain and nosebleeds.   Eyes: Negative for blurred vision, pain and discharge.  Respiratory: Negative for sputum production, shortness of breath, wheezing and stridor.   Cardiovascular: Negative for chest pain, palpitations, orthopnea and PND.  Gastrointestinal: Negative for nausea, vomiting, abdominal pain and diarrhea.  Genitourinary: Negative for urgency and frequency.  Musculoskeletal: Positive for joint pain. Negative for back pain.  Neurological: Negative for sensory change, speech change, focal weakness and weakness.  Psychiatric/Behavioral: Negative for depression and hallucinations. The patient is not nervous/anxious.   All other systems reviewed and are negative.  Tolerating Diet:yes Tolerating PT: rehab  DRUG ALLERGIES:   Allergies  Allergen Reactions  . Norco [Hydrocodone-Acetaminophen] Other (See Comments)    Feels like bugs are crawling on her  . Penicillins Rash and Other (See Comments)    Has patient had a PCN reaction causing immediate rash, facial/tongue/throat swelling, SOB or lightheadedness with hypotension: Yes Has patient had a PCN reaction causing severe rash involving mucus membranes or skin necrosis: No Has patient had a PCN reaction that required hospitalization No Has patient had a PCN reaction occurring within the last 10 years: No If all of the above answers are "NO", then may proceed with Cephalosporin use.     VITALS:  Blood pressure 106/61, pulse 82, temperature 98.1 F (36.7 C), temperature source Oral, resp. rate 19, height 5\' 1"  (1.549 m), weight 67.132 kg (148 lb), SpO2 94 %.  PHYSICAL EXAMINATION:   Physical Exam  GENERAL:  60  y.o.-year-old patient lying in the bed with no acute distress.  EYES: Pupils equal, round, reactive to light and accommodation. No scleral icterus. Extraocular muscles intact.  HEENT: Head atraumatic, normocephalic. Oropharynx and nasopharynx clear.  NECK:  Supple, no jugular venous distention. No thyroid enlargement, no tenderness.  LUNGS: Normal breath sounds bilaterally, no wheezing, rales, rhonchi. No use of accessory muscles of respiration.  CARDIOVASCULAR: S1, S2 normal. No murmurs, rubs, or gallops.  ABDOMEN: Soft, nontender, nondistended. Bowel sounds present. No organomegaly or mass.  EXTREMITIES: No cyanosis, clubbing or edema b/l.    NEUROLOGIC: Cranial nerves II through XII are intact. No focal Motor or sensory deficits b/l.   PSYCHIATRIC: The patient is alert and oriented x 3.  SKIN: No obvious rash, lesion, or ulcer.   LABORATORY PANEL:  CBC  Recent Labs Lab 01/14/15 0540  WBC 7.3  HGB 7.9*  HCT 23.3*  PLT 121*    Chemistries   Recent Labs Lab 01/14/15 0540  NA 137  K 3.6  CL 106  CO2 28  GLUCOSE 120*  BUN 9  CREATININE 0.50  CALCIUM 7.8*    Cardiac Enzymes No results for input(s): TROPONINI in the last 168 hours. RADIOLOGY:  Dg Hip Operative Unilat W Or W/o Pelvis Right  01/12/2015  CLINICAL DATA:  Intraoperative images from right femoral fracture fixation. EXAM: OPERATIVE RIGHT HIP (WITH PELVIS IF PERFORMED) FLUOROSCOPIC VIEWS TECHNIQUE: Fluoroscopic spot image(s) were submitted for interpretation post-operatively. COMPARISON:  Radiograph 01/11/2015 FINDINGS: Multiple fluoroscopic images from intra medullary rod and screw fixation of mid to distal shaft right femoral fracture fixation demonstrate placement of intra medullary rod within the femur with distal screw and  cerclage wire fixation through the fracture line. The alignment is near anatomic. Fluoroscopy time is reported as 1 minutes 48 seconds. IMPRESSION: Intraoperative images from intra medullary rod  and screw fixation of mid to distal right femoral shaft fracture, with satisfactory postsurgical alignment. Electronically Signed   By: Fidela Salisbury M.D.   On: 01/12/2015 16:09   Dg Femur, Min 2 Views Right  01/12/2015  CLINICAL DATA:  Right femur surgery. EXAM: RIGHT FEMUR 2 VIEWS COMPARISON:  01/11/2015. FINDINGS: ORIF right femur with good anatomic alignment. Hardware intact. Postsurgical changes in the soft tissues. IMPRESSION: ORIF right femur. Electronically Signed   By: Marcello Moores  Register   On: 01/12/2015 17:01   ASSESSMENT AND PLAN:  1.Displaced spiral fracture of shaft of right femur ([place]) - surgical repair by orthopedics POD#1 - Pain control. \-prn ultram and percocet -PT recommends rehab however pt wants to go home since she will not be able to afford any copays if she owes  2. Ankylosing spondylitis of lumbosacral region [facility]) - patient will already be on pain control for her fracture as above.  3. Depression - continue home antidepressants  4. Post-op anemia hgb 9.2 (14.9) -cont ferrous sulfate -no active bleeding  5.post op hypotension-improving with IVF   6.Anxiety - continue home anxiolytics  7.Hypokalemia - repleted  Case discussed with Care Management/Social Worker. Management plans discussed with the patient, family and they are in agreement.  CODE STATUS: full  DVT Prophylaxis: mechanical  TOTAL TIME TAKING CARE OF THIS PATIENT: 30 minutes.  >50% time spent on counselling and coordination of care  POSSIBLE D/C IN1-2 DAYS, DEPENDING ON CLINICAL CONDITION.  Note: This dictation was prepared with Dragon dictation along with smaller phrase technology. Any transcriptional errors that result from this process are unintentional.  Edmar Blankenburg M.D on 01/14/2015 at 7:37 AM  Between 7am to 6pm - Pager - [phone]  After 6pm go to [email or web address] - password [email or web address] [facility]  Office  [phone]  CC: Primary care physician; [facility]

## 2015-01-14 NOTE — Progress Notes (Signed)
EMS called at 1305. Report called to [facility] at 1315- spoke with Shirley Friar, RN.

## 2015-01-14 NOTE — Clinical Social Work Placement (Signed)
   CLINICAL SOCIAL WORK PLACEMENT  NOTE  Date:  01/14/2015  Patient Details  Name: Janet Zuniga MRN: [id number] Date of Birth: 1955-04-11  Clinical Social Work is seeking post-discharge placement for this patient at the [place] level of care (*CSW will initial, date and re-position this form in  chart as items are completed):  Yes   Patient/family provided with [facility] Work Department's list of facilities offering this level of care within the geographic area requested by the patient (or if unable, by the patient's family).  Yes   Patient/family informed of their freedom to choose among providers that offer the needed level of care, that participate in Medicare, Medicaid or managed care program needed by the patient, have an available bed and are willing to accept the patient.  Yes   Patient/family informed of [facility]'s ownership interest in [facility] and [facility], as well as of the fact that they are under no obligation to receive care at these facilities.  PASRR submitted to EDS on 01/13/15     PASRR number received on       Existing PASRR number confirmed on       FL2 transmitted to all facilities in geographic area requested by pt/family on 01/13/15     FL2 transmitted to all facilities within larger geographic area on       Patient informed that his/her managed care company has contracts with or will negotiate with certain facilities, including the following:        Yes   Patient/family informed of bed offers received.  Patient chooses bed at [facility]     Physician recommends and patient chooses bed at  [facility])    Patient to be transferred to [facility] on 01/14/15.  Patient to be transferred to facility by [facility]     Patient family notified on 01/14/15 of transfer.  Name of family member notified:  Ricky     PHYSICIAN       Additional Comment:     _______________________________________________ Darden Dates, LCSW 01/14/2015, 3:27 PM

## 2015-01-14 NOTE — Discharge Summary (Signed)
[place] at [place] NAME: Janet Zuniga    MR#:  [id number]  DATE OF BIRTH:  [birth year]  DATE OF ADMISSION:  01/11/2015 ADMITTING PHYSICIAN: Lance Coon, MD  DATE OF DISCHARGE: 01/14/15  PRIMARY CARE PHYSICIAN: [facility]    ADMISSION DIAGNOSIS:  Fall [W19.XXXA] Closed fracture of right femur, unspecified fracture morphology, unspecified portion of femur, initial encounter ([place]) [S72.91XA]  DISCHARGE DIAGNOSIS:  Closed right femur fracture s/p intramedullary femoral rod with nailing Post-op anemia Hypotension-resolved  SECONDARY DIAGNOSIS:   Past Medical History  Diagnosis Date  . Arthritis   . Blood in stool   . Hepatitis C 2007  . History of blood transfusion   . Breast cancer ([place])   . Anxiety     HOSPITAL COURSE:  1.Displaced spiral fracture of shaft of right femur ([place]) - surgical repair by orthopedics POD#2 - Pain control. \-prn ultram and percocet -PT recommends rehab however pt wants to go home since she will not be able to afford any copays if she owes  2. Ankylosing spondylitis of lumbosacral region [facility]) - patient will already be on pain control for her fracture as above.  3. Depression - continue home antidepressants  4. Post-op anemia hgb 7.9 (14.9) -cont ferrous sulfate -no active bleeding  5.post op hypotension-resolved   6.Anxiety - continue home anxiolytics  7.Hypokalemia - replete and monitor  All the records are reviewed and case discussed with ED provider. Management plans discussed with the patient and/or family.  DVT PROPHYLAXIS: Mechanical only due to low plts  GI PROPHYLAXIS: None D/c later to snf vs HHPT CONSULTS OBTAINED:  Treatment Team:  Earnestine Leys, MD  DRUG ALLERGIES:   Allergies  Allergen Reactions  . Norco [Hydrocodone-Acetaminophen] Other (See Comments)    Feels like bugs are crawling on her  . Penicillins Rash and Other (See Comments)    Has  patient had a PCN reaction causing immediate rash, facial/tongue/throat swelling, SOB or lightheadedness with hypotension: Yes Has patient had a PCN reaction causing severe rash involving mucus membranes or skin necrosis: No Has patient had a PCN reaction that required hospitalization No Has patient had a PCN reaction occurring within the last 10 years: No If all of the above answers are "NO", then may proceed with Cephalosporin use.     DISCHARGE MEDICATIONS:   Current Discharge Medication List    START taking these medications   Details  aspirin EC 325 MG tablet Take 1 tablet (325 mg total) by mouth daily. Qty: 30 tablet, Refills: 0    celecoxib (CELEBREX) 200 MG capsule Take 1 capsule (200 mg total) by mouth daily. Qty: 30 capsule, Refills: 0    ferrous sulfate 325 (65 FE) MG tablet Take 1 tablet (325 mg total) by mouth daily with breakfast. Qty: 30 tablet, Refills: 3      CONTINUE these medications which have CHANGED   Details  oxyCODONE-acetaminophen (ROXICET) 5-325 MG tablet Take 1 tablet by mouth every 8 (eight) hours as needed for moderate pain or severe pain. Qty: 30 tablet, Refills: 0      CONTINUE these medications which have NOT CHANGED   Details  ALPRAZolam (XANAX) 1 MG tablet Take 1 mg by mouth 4 (four) times daily as needed for anxiety.     busPIRone (BUSPAR) 5 MG tablet Take 5 mg by mouth 3 (three) times daily.    carisoprodol (SOMA) 350 MG tablet TAKE ONE TABLET BY MOUTH THREE TIMES DAILY AS  NEEDED FOR MUSCLE SPASMS Refills: 1    FLUoxetine (PROZAC) 40 MG capsule Take 40 mg by mouth daily.     traZODone (DESYREL) 150 MG tablet Take 150 mg by mouth at bedtime.     Oxycodone HCl 10 MG TABS Take 1 tablet (10 mg total) by mouth 3 (three) times daily. Qty: 12 tablet, Refills: 0    traMADol (ULTRAM) 50 MG tablet Take 1 tablet (50 mg total) by mouth every 6 (six) hours as needed for moderate pain. Qty: 12 tablet, Refills: 0        If you experience  worsening of your admission symptoms, develop shortness of breath, life threatening emergency, suicidal or homicidal thoughts you must seek medical attention immediately by calling 911 or calling your MD immediately  if symptoms less severe.  You Must read complete instructions/literature along with all the possible adverse reactions/side effects for all the Medicines you take and that have been prescribed to you. Take any new Medicines after you have completely understood and accept all the possible adverse reactions/side effects.   Please note  You were cared for by a hospitalist during your hospital stay. If you have any questions about your discharge medications or the care you received while you were in the hospital after you are discharged, you can call the unit and asked to speak with the hospitalist on call if the hospitalist that took care of you is not available. Once you are discharged, your primary care physician will handle any further medical issues. Please note that NO REFILLS for any discharge medications will be authorized once you are discharged, as it is imperative that you return to your primary care physician (or establish a relationship with a primary care physician if you do not have one) for your aftercare needs so that they can reassess your need for medications and monitor your lab values. Today   SUBJECTIVE   i want to get out of here VITAL SIGNS:  Blood pressure 106/61, pulse 82, temperature 98.1 F (36.7 C), temperature source Oral, resp. rate 19, height 5\' 1"  (1.549 m), weight 67.132 kg (148 lb), SpO2 94 %.  I/O:    Intake/Output Summary (Last 24 hours) at 01/14/15 0732 Last data filed at 01/14/15 0321  Gross per 24 hour  Intake   2243 ml  Output   1200 ml  Net   1043 ml    PHYSICAL EXAMINATION:  GENERAL:  60 y.o.-year-old patient lying in the bed with no acute distress.  EYES: Pupils equal, round, reactive to light and accommodation. No scleral icterus.  Extraocular muscles intact.  HEENT: Head atraumatic, normocephalic. Oropharynx and nasopharynx clear.  NECK:  Supple, no jugular venous distention. No thyroid enlargement, no tenderness.  LUNGS: Normal breath sounds bilaterally, no wheezing, rales,rhonchi or crepitation. No use of accessory muscles of respiration.  CARDIOVASCULAR: S1, S2 normal. No murmurs, rubs, or gallops.  ABDOMEN: Soft, non-tender, non-distended. Bowel sounds present. No organomegaly or mass.  EXTREMITIES: No pedal edema, cyanosis, or clubbing.  NEUROLOGIC: Cranial nerves II through XII are intact. Muscle strength 5/5 in all extremities. Sensation intact. Gait not checked.  PSYCHIATRIC:  patient is alert and oriented x 3.  SKIN: No obvious rash, lesion, or ulcer.   DATA REVIEW:   CBC   Recent Labs Lab 01/14/15 0540  WBC 7.3  HGB 7.9*  HCT 23.3*  PLT 121*    Chemistries   Recent Labs Lab 01/14/15 0540  NA 137  K 3.6  CL 106  CO2 28  GLUCOSE 120*  BUN 9  CREATININE 0.50  CALCIUM 7.8*    Microbiology Results   Recent Results (from the past 240 hour(s))  MRSA PCR Screening     Status: None   Collection Time: 01/12/15  2:00 AM  Result Value Ref Range Status   MRSA by PCR NEGATIVE NEGATIVE Final    Comment:        The GeneXpert MRSA Assay (FDA approved for NASAL specimens only), is one component of a comprehensive MRSA colonization surveillance program. It is not intended to diagnose MRSA infection nor to guide or monitor treatment for MRSA infections.     RADIOLOGY:  Dg Hip Operative Unilat W Or W/o Pelvis Right  01/12/2015  CLINICAL DATA:  Intraoperative images from right femoral fracture fixation. EXAM: OPERATIVE RIGHT HIP (WITH PELVIS IF PERFORMED) FLUOROSCOPIC VIEWS TECHNIQUE: Fluoroscopic spot image(s) were submitted for interpretation post-operatively. COMPARISON:  Radiograph 01/11/2015 FINDINGS: Multiple fluoroscopic images from intra medullary rod and screw fixation of mid to distal  shaft right femoral fracture fixation demonstrate placement of intra medullary rod within the femur with distal screw and cerclage wire fixation through the fracture line. The alignment is near anatomic. Fluoroscopy time is reported as 1 minutes 48 seconds. IMPRESSION: Intraoperative images from intra medullary rod and screw fixation of mid to distal right femoral shaft fracture, with satisfactory postsurgical alignment. Electronically Signed   By: Fidela Salisbury M.D.   On: 01/12/2015 16:09   Dg Femur, Min 2 Views Right  01/12/2015  CLINICAL DATA:  Right femur surgery. EXAM: RIGHT FEMUR 2 VIEWS COMPARISON:  01/11/2015. FINDINGS: ORIF right femur with good anatomic alignment. Hardware intact. Postsurgical changes in the soft tissues. IMPRESSION: ORIF right femur. Electronically Signed   By: Marcello Moores  Register   On: 01/12/2015 17:01     Management plans discussed with the patient, family and they are in agreement.  CODE STATUS:     Code Status Orders        Start     Ordered   01/12/15 2043  Full code   Continuous     01/12/15 2042    Code Status History    Date Active Date Inactive Code Status Order ID Comments User Context   01/12/2015  2:03 AM 01/12/2015  8:42 PM Full Code [id number]  Lance Coon, MD Inpatient   01/11/2015 10:49 PM 01/12/2015  2:03 AM Full Code [id number]  Earnestine Leys, MD ED      TOTAL TIME TAKING CARE OF THIS PATIENT: 40 minutes.    Johnella Crumm M.D on 01/14/2015 at 7:32 AM  Between 7am to 6pm - Pager - [phone] After 6pm go to [email or web address] - password [email or web address] [facility]  Office  [phone]  CC: Primary care physician; [facility]

## 2015-01-14 NOTE — Care Management Important Message (Signed)
Important Message  Patient Details  Name: Janet Zuniga MRN: [id number] Date of Birth: 1955-10-12   Medicare Important Message Given:  Yes    [name] 01/14/2015, 10:08 AM

## 2015-01-14 NOTE — Progress Notes (Signed)
Subjective: 2 Days Post-Op Procedure(s) (LRB): INTRAMEDULLARY (IM) NAIL INTERTROCHANTRIC (Right)       Patient reports pain as mild.  Objective:   VITALS:   Filed Vitals:   01/14/15 0352 01/14/15 0820  BP: 106/61 94/61  Pulse: 82 82  Temp: 98.1 F (36.7 C) 98.1 F (36.7 C)  Resp: 19 18    Neurologically intact ABD soft Neurovascular intact Sensation intact distally Intact pulses distally Dorsiflexion/Plantar flexion intact Incision: scant drainage  LABS  Recent Labs  01/12/15 2205 01/13/15 0334 01/14/15 0540  HGB 10.1* 9.2* 7.9*  HCT 30.4* 27.0* 23.3*  WBC 14.6* 10.8 7.3  PLT 154 142* 121*     Recent Labs  01/12/15 0914 01/12/15 2205 01/13/15 0334 01/14/15 0540  NA 136  --  133* 137  K 3.7  --  3.7 3.6  BUN 12  --  9 9  CREATININE 0.50 0.66 0.61 0.50  GLUCOSE 99  --  130* 120*     Recent Labs  01/11/15 2154 01/12/15 0914  INR 1.06 1.07     Assessment/Plan: 2 Days Post-Op Procedure(s) (LRB): INTRAMEDULLARY (IM) NAIL INTERTROCHANTRIC (Right)   Advance diet Up with therapy Discharge to SNF

## 2015-01-14 NOTE — Progress Notes (Addendum)
EMS staff here to take patient to [facility]. Discharge via stretcher. Rx.slip sent.

## 2015-01-14 NOTE — Care Management (Signed)
I spoke with patient's son Audry Pili [phone] per patient's request. Per CSW SNF is covered at 100% days's 1-20. She agrees to [facility] working on discharge. Ricky notified and agrees with discharge plan.

## 2015-01-14 NOTE — Clinical Social Work Note (Signed)
Pt is ready for discharge today to [facility]. Pt and son are agreeable and aware of discharge plan. Facility received discharge information and is ready to admit pt. RN called report and EMS provided transportation. CSW is signing off as no further needs identified.   Darden Dates, MSW, LCSW  Clinical Social Worker  [phone]

## 2015-01-19 ENCOUNTER — Ambulatory Visit: Payer: Self-pay | Admitting: Anesthesiology

## 2015-01-20 ENCOUNTER — Ambulatory Visit: Payer: Self-pay | Admitting: Anesthesiology

## 2015-02-12 ENCOUNTER — Other Ambulatory Visit: Payer: Self-pay | Admitting: *Deleted

## 2015-02-18 ENCOUNTER — Telehealth: Payer: Self-pay | Admitting: Gastroenterology

## 2015-02-18 NOTE — Telephone Encounter (Signed)
Left voice message for patient to call and schedule appointment with Dr. Allen Norris for Chronic hep C, notes under media.

## 2015-03-17 ENCOUNTER — Emergency Department
Admission: EM | Admit: 2015-03-17 | Discharge: 2015-03-17 | Disposition: A | Discharge status: Home / Self Care | Payer: Medicare Other | Attending: Emergency Medicine | Admitting: Emergency Medicine

## 2015-03-17 ENCOUNTER — Encounter: Payer: Self-pay | Admitting: Emergency Medicine

## 2015-03-17 ENCOUNTER — Emergency Department: Payer: Medicare Other

## 2015-03-17 DIAGNOSIS — B1920 Unspecified viral hepatitis C without hepatic coma: Secondary | ICD-10-CM | POA: Insufficient documentation

## 2015-03-17 DIAGNOSIS — F17210 Nicotine dependence, cigarettes, uncomplicated: Secondary | ICD-10-CM | POA: Diagnosis not present

## 2015-03-17 DIAGNOSIS — C50919 Malignant neoplasm of unspecified site of unspecified female breast: Secondary | ICD-10-CM | POA: Diagnosis not present

## 2015-03-17 DIAGNOSIS — M79606 Pain in leg, unspecified: Secondary | ICD-10-CM

## 2015-03-17 DIAGNOSIS — S7291XD Unspecified fracture of right femur, subsequent encounter for closed fracture with routine healing: Secondary | ICD-10-CM | POA: Insufficient documentation

## 2015-03-17 DIAGNOSIS — W19XXXD Unspecified fall, subsequent encounter: Secondary | ICD-10-CM | POA: Diagnosis not present

## 2015-03-17 DIAGNOSIS — Z885 Allergy status to narcotic agent status: Secondary | ICD-10-CM | POA: Diagnosis not present

## 2015-03-17 DIAGNOSIS — M25551 Pain in right hip: Secondary | ICD-10-CM | POA: Diagnosis present

## 2015-03-17 DIAGNOSIS — Z7982 Long term (current) use of aspirin: Secondary | ICD-10-CM | POA: Diagnosis not present

## 2015-03-17 DIAGNOSIS — Y939 Activity, unspecified: Secondary | ICD-10-CM | POA: Insufficient documentation

## 2015-03-17 DIAGNOSIS — Z880 Allergy status to penicillin: Secondary | ICD-10-CM | POA: Insufficient documentation

## 2015-03-17 DIAGNOSIS — Y929 Unspecified place or not applicable: Secondary | ICD-10-CM | POA: Insufficient documentation

## 2015-03-17 DIAGNOSIS — Y999 Unspecified external cause status: Secondary | ICD-10-CM | POA: Insufficient documentation

## 2015-03-17 HISTORY — DX: Fracture of unspecified part of neck of unspecified femur, initial encounter for closed fracture: S72.009A

## 2015-03-17 LAB — CBC WITH DIFFERENTIAL/PLATELET
BASOS ABS: 0 10*3/uL (ref 0–0.1)
BASOS PCT: 1 %
EOS ABS: 0.1 10*3/uL (ref 0–0.7)
Eosinophils Relative: 2 %
HEMATOCRIT: 38 % (ref 35.0–47.0)
Hemoglobin: 13 g/dL (ref 12.0–16.0)
Lymphocytes Relative: 24 %
Lymphs Abs: 2 10*3/uL (ref 1.0–3.6)
MCH: 33.1 pg (ref 26.0–34.0)
MCHC: 34.3 g/dL (ref 32.0–36.0)
MCV: 96.3 fL (ref 80.0–100.0)
MONO ABS: 0.5 10*3/uL (ref 0.2–0.9)
MONOS PCT: 6 %
NEUTROS ABS: 5.7 10*3/uL (ref 1.4–6.5)
Neutrophils Relative %: 67 %
PLATELETS: 192 10*3/uL (ref 150–440)
RBC: 3.94 MIL/uL (ref 3.80–5.20)
RDW: 14.1 % (ref 11.5–14.5)
WBC: 8.4 10*3/uL (ref 3.6–11.0)

## 2015-03-17 LAB — COMPREHENSIVE METABOLIC PANEL
ALBUMIN: 3.1 g/dL — AB (ref 3.5–5.0)
ALT: 83 U/L — ABNORMAL HIGH (ref 14–54)
ANION GAP: 6 (ref 5–15)
AST: 100 U/L — AB (ref 15–41)
Alkaline Phosphatase: 185 U/L — ABNORMAL HIGH (ref 38–126)
BUN: 24 mg/dL — ABNORMAL HIGH (ref 6–20)
CALCIUM: 8.4 mg/dL — AB (ref 8.9–10.3)
CO2: 26 mmol/L (ref 22–32)
Chloride: 101 mmol/L (ref 101–111)
Creatinine, Ser: 0.67 mg/dL (ref 0.44–1.00)
GFR calc Af Amer: >60 mL/min (ref >60)
GFR calc non Af Amer: >60 mL/min (ref >60)
GLUCOSE: 95 mg/dL (ref 65–99)
POTASSIUM: 3.8 mmol/L (ref 3.5–5.1)
SODIUM: 133 mmol/L — AB (ref 135–145)
TOTAL PROTEIN: 6.7 g/dL (ref 6.5–8.1)
Total Bilirubin: 0.2 mg/dL — ABNORMAL LOW (ref 0.3–1.2)

## 2015-03-17 LAB — SEDIMENTATION RATE: Sed Rate: 24 mm/hr (ref 0–30)

## 2015-03-17 MED ORDER — TRAMADOL HCL 50 MG PO TABS
50.0000 mg | ORAL_TABLET | Freq: Four times a day (QID) | ORAL | Status: DC | PRN
Start: 2015-03-17 — End: 2015-04-29

## 2015-03-17 NOTE — ED Notes (Signed)
PT refusing ultram prescription.  Pt making multiple calls for ride home.

## 2015-03-17 NOTE — Discharge Instructions (Signed)
Musculoskeletal Pain Musculoskeletal pain is muscle and boney aches and pains. These pains can occur in any part of the body. Your caregiver may treat you without knowing the cause of the pain. They may treat you if blood or urine tests, X-rays, and other tests were normal.  CAUSES There is often not a definite cause or reason for these pains. These pains may be caused by a type of germ (virus). The discomfort may also come from overuse. Overuse includes working out too hard when your body is not fit. Boney aches also come from weather changes. Bone is sensitive to atmospheric pressure changes. HOME CARE INSTRUCTIONS   Ask when your test results will be ready. Make sure you get your test results.  Only take over-the-counter or prescription medicines for pain, discomfort, or fever as directed by your caregiver. If you were given medications for your condition, do not drive, operate machinery or power tools, or sign legal documents for 24 hours. Do not drink alcohol. Do not take sleeping pills or other medications that may interfere with treatment.  Continue all activities unless the activities cause more pain. When the pain lessens, slowly resume normal activities. Gradually increase the intensity and duration of the activities or exercise.  During periods of severe pain, bed rest may be helpful. Lay or sit in any position that is comfortable.  Putting ice on the injured area.  Put ice in a bag.  Place a towel between your skin and the bag.  Leave the ice on for 15 to 20 minutes, 3 to 4 times a day.  Follow up with your caregiver for continued problems and no reason can be found for the pain. If the pain becomes worse or does not go away, it may be necessary to repeat tests or do additional testing. Your caregiver may need to look further for a possible cause. SEEK IMMEDIATE MEDICAL CARE IF:  You have pain that is getting worse and is not relieved by medications.  You develop chest pain  that is associated with shortness or breath, sweating, feeling sick to your stomach (nauseous), or throw up (vomit).  Your pain becomes localized to the abdomen.  You develop any new symptoms that seem different or that concern you. MAKE SURE YOU:   Understand these instructions.  Will watch your condition.  Will get help right away if you are not doing well or get worse.   This information is not intended to replace advice given to you by your health care provider. Make sure you discuss any questions you have with your health care provider.   Document Released: 12/19/2004 Document Revised: 03/13/2011 Document Reviewed: 08/23/2012 Elsevier Interactive Patient Education [employer].   Please use the Ultram if needed for pain. Call Dr. Sabra Heck the orthopedic surgeon who operated on U have him follow-up with you in the next few days or so. Return as needed

## 2015-03-17 NOTE — ED Notes (Signed)
Malinda in to speak with patient.  Pt unhappy with Ultram prescription.  Pt requesting Vicodin prescription.  Cinda Quest, MD spoke with patient about this.

## 2015-03-17 NOTE — ED Notes (Signed)
Patient transported to X-ray 

## 2015-03-17 NOTE — ED Provider Notes (Signed)
[facility] Emergency Department Provider Note  ____________________________________________  Time seen: Approximately 1:41 PM  I have reviewed the triage vital signs and the nursing notes.   HISTORY  Chief Complaint Hip Pain    HPI Janet Zuniga is a 60 y.o. female who fell and fractured her femur. She she had surgical repair and complains of pain in her leg ever since. She is asking for narcotics. Surgery was done on January 10. Pain is been unchanged again since surgery. She says she can't take anything with Tylenol in it because she has hepatitis C.   Past Medical History  Diagnosis Date  . Arthritis   . Blood in stool   . Hepatitis C 2007  . History of blood transfusion   . Breast cancer ([place])   . Anxiety   . Hip fracture [facility])     Patient Active Problem List   Diagnosis Date Noted  . Displaced spiral fracture of shaft of right femur ([place]) 01/12/2015  . Anxiety 01/12/2015  . Hypokalemia 01/12/2015  . Major depression ([place]) 10/27/2014  . Substance induced mood disorder ([place]) 10/27/2014  . History of breast cancer in female 12/23/2013  . Chronic pain 12/23/2013  . Ankylosing spondylitis of lumbosacral region ([place]) 12/23/2013  . Depression 12/23/2013    Past Surgical History  Procedure Laterality Date  . Foot surgery  2000 and 2003  . Knee surgery Left 2004  . Breast lumpectomy Left 2010  . Intramedullary (im) nail intertrochanteric Right 01/12/2015    Procedure: INTRAMEDULLARY (IM) NAIL INTERTROCHANTRIC;  Surgeon: Earnestine Leys, MD;  Location: [facility] ORS;  Service: Orthopedics;  Laterality: Right;  . Femur fracture surgery      Current Outpatient Rx  Name  Route  Sig  Dispense  Refill  . ALPRAZolam (XANAX) 1 MG tablet   Oral   Take 1 mg by mouth 4 (four) times daily as needed for anxiety.          Marland Kitchen aspirin EC 325 MG tablet   Oral   Take 1 tablet (325 mg total) by mouth 2 (two) times daily.   30 tablet   0   . busPIRone  (BUSPAR) 5 MG tablet   Oral   Take 5 mg by mouth 3 (three) times daily.         . carisoprodol (SOMA) 350 MG tablet      TAKE ONE TABLET BY MOUTH THREE TIMES DAILY AS NEEDED FOR MUSCLE SPASMS      1   . celecoxib (CELEBREX) 200 MG capsule   Oral   Take 1 capsule (200 mg total) by mouth daily.   30 capsule   0   . ferrous sulfate 325 (65 FE) MG tablet   Oral   Take 1 tablet (325 mg total) by mouth daily with breakfast.   30 tablet   3   . FLUoxetine (PROZAC) 40 MG capsule   Oral   Take 40 mg by mouth daily.          . Oxycodone HCl 10 MG TABS   Oral   Take 1 tablet (10 mg total) by mouth 3 (three) times daily. Patient not taking: Reported on 01/11/2015   12 tablet   0   . oxyCODONE-acetaminophen (ROXICET) 5-325 MG tablet   Oral   Take 1 tablet by mouth every 8 (eight) hours as needed for moderate pain or severe pain.   30 tablet   0   . traMADol (ULTRAM) 50 MG tablet  Oral   Take 1 tablet (50 mg total) by mouth every 6 (six) hours as needed for moderate pain. Patient not taking: Reported on 01/11/2015   12 tablet   0   . traMADol (ULTRAM) 50 MG tablet   Oral   Take 1 tablet (50 mg total) by mouth every 6 (six) hours as needed.   20 tablet   0   . traZODone (DESYREL) 150 MG tablet   Oral   Take 150 mg by mouth at bedtime.            Allergies Norco and Penicillins  Family History  Problem Relation Age of Onset  . Arthritis Mother   . Stroke Mother   . Heart disease Mother   . Hypertension Mother   . Hypertension Father   . Hypertension Brother   . Heart disease Maternal Aunt     Social History Social History  Substance Use Topics  . Smoking status: Current Every Day Smoker -- 1.00 packs/day    Types: Cigarettes  . Smokeless tobacco: Never Used  . Alcohol Use: Yes    Review of Systems Constitutional: No fever/chills Eyes: No visual changes. ENT: No sore throat. Cardiovascular: Denies chest pain. Respiratory: Denies shortness of  breath. Gastrointestinal: No abdominal pain.  No nausea, no vomiting.  No diarrhea.  No constipation. Genitourinary: Negative for dysuria. Musculoskeletal: Negative for back pain. Skin: Negative for rash. Neurological: Negative for headaches, focal weakness or numbness.  10-point ROS otherwise negative.  ____________________________________________   PHYSICAL EXAM:  VITAL SIGNS: ED Triage Vitals  Enc Vitals Group     BP 03/17/15 1235 135/95 mmHg     Pulse Rate 03/17/15 1235 87     Resp 03/17/15 1235 18     Temp 03/17/15 1235 98.3 F (36.8 C)     Temp Source 03/17/15 1235 Oral     SpO2 03/17/15 1235 95 %     Weight 03/17/15 1235 140 lb (63.504 kg)     Height 03/17/15 1235 5\' 1"  (1.549 m)     Head Cir --      Peak Flow --      Pain Score 03/17/15 1236 10     Pain Loc --      Pain Edu? --      Excl. in [place]? --     Constitutional: Alert and oriented. Well appearing and in no acute distress. Eyes: Conjunctivae are normal. PERRL. EOMI. Head: Atraumatic. Nose: No congestion/rhinnorhea. Mouth/Throat: Mucous membranes are moist.  Oropharynx non-erythematous. Neck: No stridor.   Cardiovascular: Normal rate, regular rhythm. Grossly normal heart sounds.  Good peripheral circulation. Respiratory: Normal respiratory effort.  No retractions. Lungs CTAB. Gastrointestinal: Soft and nontender. No distention. No abdominal bruits. No CVA tenderness. Musculoskeletal: No lower extremity  edema.  No joint effusions. Surgical wounds are well-healed. Patient moves the leg without any apparent difficulty. Patient is able to sit up without any apparent difficulty. Patient complains of palpable pain on palpation of the leg however. There is no redness or other apparent swelling or discoloration in the area Neurologic:  Normal speech and language. No gross focal neurologic deficits are appreciated. No gait instability. Skin:  Skin is warm, dry and intact. No rash noted. Psychiatric: Mood and affect  are normal. Speech and behavior are normal.  ____________________________________________   LABS (all labs ordered are listed, but only abnormal results are displayed)  Labs Reviewed  COMPREHENSIVE METABOLIC PANEL - Abnormal; Notable for the following:    Sodium 133 (*)  BUN 24 (*)    Calcium 8.4 (*)    Albumin 3.1 (*)    AST 100 (*)    ALT 83 (*)    Alkaline Phosphatase 185 (*)    Total Bilirubin 0.2 (*)    All other components within normal limits  CBC WITH DIFFERENTIAL/PLATELET  SEDIMENTATION RATE   ____________________________________________  EKG   ____________________________________________  RADIOLOGY  X-ray shows all the hardware in place over the fracture does not appear to be healing per radiology ____________________________________________   PROCEDURES  Patient is constantly asking for Percocet. Percocet without Tylenol since she says she has hepatitis C and cannot take Tylenol. Patient is very sleepy and groggy. He occasionally slurs her words. She does not appear to be in any pain whatsoever. I told her that we cannot give narcotics for chronic pain per ER policy. She does not appear to be very happy with that however she can follow-up with Dr. Sabra Heck for further treatment.  ____________________________________________   INITIAL IMPRESSION / [place] / ED COURSE  Pertinent labs & imaging results that were available during my care of the patient were reviewed by me and considered in my medical decision making (see chart for details).   ____________________________________________   FINAL CLINICAL IMPRESSION(S) / ED DIAGNOSES  Final diagnoses:  Pain of lower extremity, unspecified laterality      Nena Polio, MD 03/17/15 1654

## 2015-03-17 NOTE — ED Notes (Signed)
Pt arrived via EMS from home for complaints of right hip pain. Pt states recently had hip surgery to repair hip fracture. EMS reports pt pivoted with 2 man assist. EMS reports 125/74, 97% RA, 80 HR.

## 2015-04-01 ENCOUNTER — Ambulatory Visit: Payer: Self-pay | Admitting: Gastroenterology

## 2015-04-26 ENCOUNTER — Ambulatory Visit: Payer: Medicare Other | Admitting: Podiatry

## 2015-04-29 ENCOUNTER — Emergency Department
Admission: EM | Admit: 2015-04-29 | Discharge: 2015-04-29 | Disposition: A | Discharge status: Home / Self Care | Payer: Medicare Other | Attending: Emergency Medicine | Admitting: Emergency Medicine

## 2015-04-29 ENCOUNTER — Emergency Department: Payer: Medicare Other

## 2015-04-29 ENCOUNTER — Encounter: Payer: Self-pay | Admitting: *Deleted

## 2015-04-29 DIAGNOSIS — M25561 Pain in right knee: Secondary | ICD-10-CM

## 2015-04-29 DIAGNOSIS — F17210 Nicotine dependence, cigarettes, uncomplicated: Secondary | ICD-10-CM | POA: Insufficient documentation

## 2015-04-29 DIAGNOSIS — Z7982 Long term (current) use of aspirin: Secondary | ICD-10-CM | POA: Diagnosis not present

## 2015-04-29 DIAGNOSIS — G8918 Other acute postprocedural pain: Secondary | ICD-10-CM | POA: Diagnosis not present

## 2015-04-29 DIAGNOSIS — Z79899 Other long term (current) drug therapy: Secondary | ICD-10-CM | POA: Insufficient documentation

## 2015-04-29 DIAGNOSIS — F329 Major depressive disorder, single episode, unspecified: Secondary | ICD-10-CM | POA: Insufficient documentation

## 2015-04-29 DIAGNOSIS — Z853 Personal history of malignant neoplasm of breast: Secondary | ICD-10-CM | POA: Insufficient documentation

## 2015-04-29 DIAGNOSIS — R52 Pain, unspecified: Secondary | ICD-10-CM

## 2015-04-29 LAB — BASIC METABOLIC PANEL
ANION GAP: 11 (ref 5–15)
BUN: 14 mg/dL (ref 6–20)
CALCIUM: 9.4 mg/dL (ref 8.9–10.3)
CO2: 24 mmol/L (ref 22–32)
CREATININE: 0.61 mg/dL (ref 0.44–1.00)
Chloride: 100 mmol/L — ABNORMAL LOW (ref 101–111)
Glucose, Bld: 95 mg/dL (ref 65–99)
Potassium: 2.9 mmol/L — CL (ref 3.5–5.1)
SODIUM: 135 mmol/L (ref 135–145)

## 2015-04-29 MED ORDER — POTASSIUM CHLORIDE CRYS ER 20 MEQ PO TBCR: 40.0000 meq | EXTENDED_RELEASE_TABLET | Freq: Once | ORAL | Status: DC

## 2015-04-29 MED ORDER — POTASSIUM CHLORIDE CRYS ER 20 MEQ PO TBCR
40.0000 meq | EXTENDED_RELEASE_TABLET | Freq: Once | ORAL | Status: AC
  Administered 2015-04-29: 40 meq via ORAL
  Filled 2015-04-29: qty 2

## 2015-04-29 MED ORDER — SULFAMETHOXAZOLE-TRIMETHOPRIM 800-160 MG PO TABS: 1.0000 | ORAL_TABLET | Freq: Two times a day (BID) | ORAL | Status: DC

## 2015-04-29 MED ORDER — MORPHINE SULFATE (PF) 4 MG/ML IV SOLN
4.0000 mg | Freq: Once | INTRAVENOUS | Status: AC
  Administered 2015-04-29: 4 mg via INTRAVENOUS
  Filled 2015-04-29: qty 1

## 2015-04-29 MED ORDER — ONDANSETRON HCL 4 MG/2ML IJ SOLN
4.0000 mg | Freq: Once | INTRAMUSCULAR | Status: AC
  Administered 2015-04-29: 4 mg via INTRAVENOUS
  Filled 2015-04-29: qty 2

## 2015-04-29 MED ORDER — METHOCARBAMOL 500 MG PO TABS
500.0000 mg | ORAL_TABLET | Freq: Once | ORAL | Status: AC
  Administered 2015-04-29: 500 mg via ORAL
  Filled 2015-04-29: qty 1

## 2015-04-29 MED ORDER — SULFAMETHOXAZOLE-TRIMETHOPRIM 800-160 MG PO TABS
1.0000 | ORAL_TABLET | Freq: Once | ORAL | Status: AC
  Administered 2015-04-29: 1 via ORAL
  Filled 2015-04-29: qty 1

## 2015-04-29 MED ORDER — IOPAMIDOL (ISOVUE-300) INJECTION 61%
100.0000 mL | Freq: Once | INTRAVENOUS | Status: AC | PRN
  Administered 2015-04-29: 100 mL via INTRAVENOUS
  Filled 2015-04-29: qty 100

## 2015-04-29 MED ORDER — OXYCODONE HCL 5 MG PO TABS
5.0000 mg | ORAL_TABLET | Freq: Once | ORAL | Status: AC
  Administered 2015-04-29: 5 mg via ORAL
  Filled 2015-04-29: qty 1

## 2015-04-29 MED ORDER — OXYCODONE HCL 10 MG PO TABS: 10.0000 mg | ORAL_TABLET | Freq: Three times a day (TID) | ORAL | Status: DC

## 2015-04-29 NOTE — ED Provider Notes (Signed)
-----------------------------------------   5:02 PM on 04/29/2015 -----------------------------------------   Blood pressure 152/95, pulse 95, temperature 98.1 F (36.7 C), temperature source Oral, resp. rate 18, height 5' (1.524 m), weight 65.772 kg, SpO2 97 %.  Assuming care from NP [facility].  In short, Janet Zuniga is a 60 y.o. female with a chief complaint of Knee Pain .  Refer to the original H&P for additional details.  The current plan of care is to await CT with contrast to evaluate for infection and around right femur/knee.  IMPRESSION: 1. Mild apparent periosteal apposition along the medial aspect of the proximal portion of the fracture. Nonspecific mild focal lucency about the distal aspect of the intramedullary rod. Given clinical concern, underlying infection cannot be excluded, though not definitely characterized on CT. Would correlate with the patient's symptoms and lab values (CRP, etc.), and consider three-phase bone scan for further evaluation as deemed clinically appropriate. 2. Right distal femoral fracture remains evident, with some degree of healing response.  Discharge plan. Discharged to orthopedics Dr. Sabra Heck. Will start patient on Bactrim DS twice a day #20 and oxycodone 10 mg. Patient voices no other emergency medical complaints at this time and is willing to be discharged home with follow-up to her orthopedics.  Arlyss Repress, PA-C 04/29/15 1838  Schuyler Amor, MD 04/29/15 2007

## 2015-04-29 NOTE — ED Provider Notes (Signed)
[facility] Emergency Department Provider Note ____________________________________________  Time seen: Approximately 1:22 PM  I have reviewed the triage vital signs and the nursing notes.   HISTORY  Chief Complaint Knee Pain    HPI Janet Zuniga is a 60 y.o. female who presents to the emergency department for evaluation of right knee pain. She states the pain began 3-4 days ago without injury. She states that she did fall a week or so ago but did not have any pain after falling. She had a femur fracture with subsequent repair in January of this year. She is taking ibuprofen without relief of pain.  Past Medical History  Diagnosis Date  . Arthritis   . Blood in stool   . Hepatitis C 2007  . History of blood transfusion   . Breast cancer ([place])   . Anxiety   . Hip fracture [facility])     Patient Active Problem List   Diagnosis Date Noted  . Displaced spiral fracture of shaft of right femur ([place]) 01/12/2015  . Anxiety 01/12/2015  . Hypokalemia 01/12/2015  . Major depression ([place]) 10/27/2014  . Substance induced mood disorder ([place]) 10/27/2014  . History of breast cancer in female 12/23/2013  . Chronic pain 12/23/2013  . Ankylosing spondylitis of lumbosacral region ([place]) 12/23/2013  . Depression 12/23/2013    Past Surgical History  Procedure Laterality Date  . Foot surgery  2000 and 2003  . Knee surgery Left 2004  . Breast lumpectomy Left 2010  . Intramedullary (im) nail intertrochanteric Right 01/12/2015    Procedure: INTRAMEDULLARY (IM) NAIL INTERTROCHANTRIC;  Surgeon: Earnestine Leys, MD;  Location: [facility] ORS;  Service: Orthopedics;  Laterality: Right;  . Femur fracture surgery      Current Outpatient Rx  Name  Route  Sig  Dispense  Refill  . ALPRAZolam (XANAX) 1 MG tablet   Oral   Take 1 mg by mouth 4 (four) times daily as needed for anxiety.          Marland Kitchen aspirin EC 325 MG tablet   Oral   Take 1 tablet (325 mg total) by mouth 2 (two) times  daily.   30 tablet   0   . busPIRone (BUSPAR) 5 MG tablet   Oral   Take 5 mg by mouth 3 (three) times daily.         . carisoprodol (SOMA) 350 MG tablet      TAKE ONE TABLET BY MOUTH THREE TIMES DAILY AS NEEDED FOR MUSCLE SPASMS      1   . celecoxib (CELEBREX) 200 MG capsule   Oral   Take 1 capsule (200 mg total) by mouth daily.   30 capsule   0   . ferrous sulfate 325 (65 FE) MG tablet   Oral   Take 1 tablet (325 mg total) by mouth daily with breakfast.   30 tablet   3   . FLUoxetine (PROZAC) 40 MG capsule   Oral   Take 40 mg by mouth daily.          . Oxycodone HCl 10 MG TABS   Oral   Take 1 tablet (10 mg total) by mouth 3 (three) times daily.   20 tablet   0   . oxyCODONE-acetaminophen (ROXICET) 5-325 MG tablet   Oral   Take 1 tablet by mouth every 8 (eight) hours as needed for moderate pain or severe pain.   30 tablet   0   . sulfamethoxazole-trimethoprim (BACTRIM DS,SEPTRA DS)  800-160 MG tablet   Oral   Take 1 tablet by mouth 2 (two) times daily.   20 tablet   0   . traZODone (DESYREL) 150 MG tablet   Oral   Take 150 mg by mouth at bedtime.            Allergies Norco and Penicillins  Family History  Problem Relation Age of Onset  . Arthritis Mother   . Stroke Mother   . Heart disease Mother   . Hypertension Mother   . Hypertension Father   . Hypertension Brother   . Heart disease Maternal Aunt     Social History Social History  Substance Use Topics  . Smoking status: Current Every Day Smoker -- 1.00 packs/day    Types: Cigarettes  . Smokeless tobacco: Never Used  . Alcohol Use: Yes    Review of Systems Constitutional: No recent illness.Mechanical fall approximately 1 week ago while at home. Respiratory: Denies shortness of breath. Musculoskeletal: Pain in Right knee. Skin: Negative for wound. Neurological: Negative for headaches, focal weakness or numbness.  ____________________________________________   PHYSICAL  EXAM:  VITAL SIGNS: ED Triage Vitals  Enc Vitals Group     BP 04/29/15 1236 152/95 mmHg     Pulse Rate 04/29/15 1236 95     Resp 04/29/15 1236 18     Temp 04/29/15 1236 98.1 F (36.7 C)     Temp Source 04/29/15 1236 Oral     SpO2 04/29/15 1236 97 %     Weight 04/29/15 1236 145 lb (65.772 kg)     Height 04/29/15 1236 5' (1.524 m)     Head Cir --      Peak Flow --      Pain Score 04/29/15 1236 10     Pain Loc --      Pain Edu? --      Excl. in [place]? --     Constitutional: Alert and oriented. Well appearing and in no acute distress. Eyes: Conjunctivae are normal. EOMI. Head: Atraumatic. Nose: No congestion/rhinnorhea. Neck: No stridor.  Respiratory: Normal respiratory effort.   Musculoskeletal: Right lower extremity with well healing surgical site right distal femur. Diffuse tenderness of the right knee with a small joint effusion. Range of motion of the right knee is limited due to pain. There is no deformity. The patella appears to track midline. Neurologic:  Normal speech and language. No gross focal neurologic deficits are appreciated. Skin:  Skin is warm, dry and intact. Atraumatic. Psychiatric: Mood and affect are normal. Speech and behavior are normal.  ____________________________________________   LABS (all labs ordered are listed, but only abnormal results are displayed)  Labs Reviewed  BASIC METABOLIC PANEL - Abnormal; Notable for the following:    Potassium 2.9 (*)    Chloride 100 (*)    All other components within normal limits   ____________________________________________  RADIOLOGY  Locking intra medullary rod distal femur with periosteal reaction around the hardware and lucency around the distal screws. This may be due to healing versus hardware infection per radiology. ____________________________________________   PROCEDURES  Procedure(s) performed: None   ____________________________________________   INITIAL IMPRESSION / ASSESSMENT AND  PLAN / ED COURSE  Pertinent labs & imaging results that were available during my care of the patient were reviewed by me and considered in my medical decision making (see chart for details).  CT scan of the knee with contrast will be ordered ____________________________________________   FINAL CLINICAL IMPRESSION(S) / ED DIAGNOSES  Final diagnoses:  Pain  Postoperative pain of right knee       Victorino Dike, FNP 05/01/15 1758  Carrie Mew, MD 05/03/15 1530

## 2015-04-29 NOTE — ED Notes (Signed)
States right knee pain that began Monday, states she fell several months ago on the right leg

## 2015-04-29 NOTE — ED Notes (Signed)
Developed right knee pain since Monday  unknown injury but unable to bear wt  Positive swelling noted

## 2015-04-29 NOTE — Discharge Instructions (Signed)

## 2015-05-19 ENCOUNTER — Ambulatory Visit
Admission: RE | Admit: 2015-05-19 | Discharge: 2015-05-19 | Disposition: A | Discharge status: Home / Self Care | Payer: Medicare Other | Source: Ambulatory Visit | Attending: Oncology | Admitting: Oncology

## 2015-05-19 DIAGNOSIS — G90521 Complex regional pain syndrome I of right lower limb: Secondary | ICD-10-CM | POA: Insufficient documentation

## 2015-05-19 DIAGNOSIS — C50912 Malignant neoplasm of unspecified site of left female breast: Secondary | ICD-10-CM

## 2015-05-19 DIAGNOSIS — Z853 Personal history of malignant neoplasm of breast: Secondary | ICD-10-CM | POA: Insufficient documentation

## 2015-05-19 DIAGNOSIS — Z79891 Long term (current) use of opiate analgesic: Secondary | ICD-10-CM | POA: Insufficient documentation

## 2015-05-22 ENCOUNTER — Emergency Department
Admission: EM | Admit: 2015-05-22 | Discharge: 2015-05-22 | Disposition: A | Discharge status: Home / Self Care | Payer: Medicare Other | Attending: Student | Admitting: Student

## 2015-05-22 ENCOUNTER — Emergency Department: Payer: Medicare Other

## 2015-05-22 ENCOUNTER — Encounter: Payer: Self-pay | Admitting: Emergency Medicine

## 2015-05-22 DIAGNOSIS — Z79899 Other long term (current) drug therapy: Secondary | ICD-10-CM | POA: Diagnosis not present

## 2015-05-22 DIAGNOSIS — F329 Major depressive disorder, single episode, unspecified: Secondary | ICD-10-CM | POA: Diagnosis not present

## 2015-05-22 DIAGNOSIS — Z792 Long term (current) use of antibiotics: Secondary | ICD-10-CM | POA: Diagnosis not present

## 2015-05-22 DIAGNOSIS — Y939 Activity, unspecified: Secondary | ICD-10-CM | POA: Diagnosis not present

## 2015-05-22 DIAGNOSIS — W010XXA Fall on same level from slipping, tripping and stumbling without subsequent striking against object, initial encounter: Secondary | ICD-10-CM | POA: Diagnosis not present

## 2015-05-22 DIAGNOSIS — Z79891 Long term (current) use of opiate analgesic: Secondary | ICD-10-CM | POA: Diagnosis not present

## 2015-05-22 DIAGNOSIS — F17210 Nicotine dependence, cigarettes, uncomplicated: Secondary | ICD-10-CM | POA: Diagnosis not present

## 2015-05-22 DIAGNOSIS — Y92009 Unspecified place in unspecified non-institutional (private) residence as the place of occurrence of the external cause: Secondary | ICD-10-CM | POA: Diagnosis not present

## 2015-05-22 DIAGNOSIS — M79604 Pain in right leg: Secondary | ICD-10-CM

## 2015-05-22 DIAGNOSIS — Z7982 Long term (current) use of aspirin: Secondary | ICD-10-CM | POA: Insufficient documentation

## 2015-05-22 DIAGNOSIS — Y999 Unspecified external cause status: Secondary | ICD-10-CM | POA: Insufficient documentation

## 2015-05-22 DIAGNOSIS — Z853 Personal history of malignant neoplasm of breast: Secondary | ICD-10-CM | POA: Diagnosis not present

## 2015-05-22 MED ORDER — HYDROMORPHONE HCL 1 MG/ML IJ SOLN
1.0000 mg | Freq: Once | INTRAMUSCULAR | Status: AC
  Administered 2015-05-22: 1 mg via INTRAMUSCULAR
  Filled 2015-05-22: qty 1

## 2015-05-22 NOTE — ED Notes (Signed)
Pt ride arrived at this time and is taking pt out to car, pt stable, NAD noted.

## 2015-05-22 NOTE — ED Notes (Signed)
PA Ron at bedside. 

## 2015-05-22 NOTE — ED Notes (Signed)
Patient transported to X-ray 

## 2015-05-22 NOTE — ED Provider Notes (Signed)
[facility] Emergency Department Provider Note   ____________________________________________  Time seen: Approximately 7:52 PM  I have reviewed the triage vital signs and the nursing notes.   HISTORY  Chief Complaint Leg Pain and Fall    HPI Janet Zuniga is a 60 y.o. female patient plana right leg pain secondary to a fall at home. Patient states she tripped and fall. However the patient admits to having multiple wine coolers earlier today. Patient has a history of a femur fracture which occurred in November 2016. Patient has internal fixation and continues to give pain medication from orthopedic Dr. Patient states she can only get pain relief with taking 10 mg of oxycodone. Patient has a strong history of narcotic pain use since the injury. Patient also has a history of substance abuse. Patient is rating her pain as a 10 over 10. Patient described a pain as "sharp". According to the patient or palliative measures taken prior to arrival. Past Medical History  Diagnosis Date  . Arthritis   . Blood in stool   . Hepatitis C 2007  . History of blood transfusion   . Anxiety   . Hip fracture ([place])   . Breast cancer [facility]) 2010    Left breast- chemo/radiation    Patient Active Problem List   Diagnosis Date Noted  . Displaced spiral fracture of shaft of right femur ([place]) 01/12/2015  . Anxiety 01/12/2015  . Hypokalemia 01/12/2015  . Major depression ([place]) 10/27/2014  . Substance induced mood disorder ([place]) 10/27/2014  . History of breast cancer in female 12/23/2013  . Chronic pain 12/23/2013  . Ankylosing spondylitis of lumbosacral region ([place]) 12/23/2013  . Depression 12/23/2013    Past Surgical History  Procedure Laterality Date  . Foot surgery  2000 and 2003  . Knee surgery Left 2004  . Breast lumpectomy Left 2010  . Intramedullary (im) nail intertrochanteric Right 01/12/2015    Procedure: INTRAMEDULLARY (IM) NAIL INTERTROCHANTRIC;  Surgeon: Earnestine Leys, MD;  Location: [facility] ORS;  Service: Orthopedics;  Laterality: Right;  . Femur fracture surgery    . Breast biopsy Right 1983    neg  . Breast excisional biopsy Left 2010    Current Outpatient Rx  Name  Route  Sig  Dispense  Refill  . ALPRAZolam (XANAX) 1 MG tablet   Oral   Take 1 mg by mouth 4 (four) times daily as needed for anxiety.          Marland Kitchen aspirin EC 325 MG tablet   Oral   Take 1 tablet (325 mg total) by mouth 2 (two) times daily.   30 tablet   0   . busPIRone (BUSPAR) 5 MG tablet   Oral   Take 5 mg by mouth 3 (three) times daily.         . carisoprodol (SOMA) 350 MG tablet      TAKE ONE TABLET BY MOUTH THREE TIMES DAILY AS NEEDED FOR MUSCLE SPASMS      1   . celecoxib (CELEBREX) 200 MG capsule   Oral   Take 1 capsule (200 mg total) by mouth daily.   30 capsule   0   . ferrous sulfate 325 (65 FE) MG tablet   Oral   Take 1 tablet (325 mg total) by mouth daily with breakfast.   30 tablet   3   . FLUoxetine (PROZAC) 40 MG capsule   Oral   Take 40 mg by mouth daily.          Marland Kitchen  Oxycodone HCl 10 MG TABS   Oral   Take 1 tablet (10 mg total) by mouth 3 (three) times daily.   20 tablet   0   . oxyCODONE-acetaminophen (ROXICET) 5-325 MG tablet   Oral   Take 1 tablet by mouth every 8 (eight) hours as needed for moderate pain or severe pain.   30 tablet   0   . sulfamethoxazole-trimethoprim (BACTRIM DS,SEPTRA DS) 800-160 MG tablet   Oral   Take 1 tablet by mouth 2 (two) times daily.   20 tablet   0   . traZODone (DESYREL) 150 MG tablet   Oral   Take 150 mg by mouth at bedtime.            Allergies Norco and Penicillins  Family History  Problem Relation Age of Onset  . Arthritis Mother   . Stroke Mother   . Heart disease Mother   . Hypertension Mother   . Hypertension Father   . Hypertension Brother   . Heart disease Maternal Aunt   . Breast cancer Neg Hx     Social History Social History  Substance Use Topics  . Smoking  status: Current Every Day Smoker -- 1.00 packs/day    Types: Cigarettes  . Smokeless tobacco: Never Used  . Alcohol Use: Yes    Review of Systems Constitutional: No fever/chills Eyes: No visual changes. ENT: No sore throat. Cardiovascular: Denies chest pain. Respiratory: Denies shortness of breath. Gastrointestinal: No abdominal pain.  No nausea, no vomiting.  No diarrhea.  No constipation. Genitourinary: Negative for dysuria. Musculoskeletal: Right leg pain Skin: Negative for rash. Neurological: Negative for headaches, focal weakness or numbness. Psychiatric:Anxiety and depression Hematological/Lymphatic:Hepatitis C Allergic/Immunilogical: Norco and penicillin.   ____________________________________________   PHYSICAL EXAM:  VITAL SIGNS: ED Triage Vitals  Enc Vitals Group     BP 05/22/15 1859 128/87 mmHg     Pulse Rate 05/22/15 1859 71     Resp 05/22/15 1859 16     Temp 05/22/15 1905 98.5 F (36.9 C)     Temp Source 05/22/15 1859 Oral     SpO2 05/22/15 1859 96 %     Weight --      Height --      Head Cir --      Peak Flow --      Pain Score 05/22/15 1900 10     Pain Loc --      Pain Edu? --      Excl. in [place]? --     Constitutional: Alert and oriented. Well appearing and in no acute distress. Eyes: Conjunctivae are normal. PERRL. EOMI. Head: Atraumatic. Nose: No congestion/rhinnorhea. Mouth/Throat: Mucous membranes are moist.  Oropharynx non-erythematous. Neck: No stridor.  No cervical spine tenderness to palpation. Hematological/Lymphatic/Immunilogical: No cervical lymphadenopathy. Cardiovascular: Normal rate, regular rhythm. Grossly normal heart sounds.  Good peripheral circulation. Respiratory: Normal respiratory effort.  No retractions. Lungs CTAB. Gastrointestinal: Soft and nontender. No distention. No abdominal bruits. No CVA tenderness. Musculoskeletal: No obvious deformity of the right lower extremity. There is no abrasion or ecchymosis. Surgical  scars consistent with history.  Neurologic:  Normal speech and language. No gross focal neurologic deficits are appreciated. No gait instability. Skin:  Skin is warm, dry and intact. No rash noted. Psychiatric: Mood and affect are normal. Speech and behavior are normal.  ____________________________________________   LABS (all labs ordered are listed, but only abnormal results are displayed)  Labs Reviewed - No data to display ____________________________________________  EKG  ____________________________________________  RADIOLOGY  X-ray reveals no acute findings in comparison to previous films taken. Internal fixation all stable. ____________________________________________   PROCEDURES  Procedure(s) performed: None  Critical Care performed: No  ____________________________________________   INITIAL IMPRESSION / ASSESSMENT AND PLAN / ED COURSE  Pertinent labs & imaging results that were available during my care of the patient were reviewed by me and considered in my medical decision making (see chart for details).  Discussed x-ray finding with patient. Patient states she describes pain medication. One advised the patient that I will give her 2 day supply of Tylenol with Codeine she became and states she cannot tolerate that medication secondary to Tylenol. Advised patient will give her a 2 day supply of tramadol and the patient became more agitated and stated she will rather take the Tylenol with codeine. Patient became verbally abrasive and states she wants to leave. Advised to follow-up with treating doctor for pain control. ____________________________________________   FINAL CLINICAL IMPRESSION(S) / ED DIAGNOSES  Final diagnoses:  Right leg pain      NEW MEDICATIONS STARTED DURING THIS VISIT:  New Prescriptions   No medications on file     Note:  This document was prepared using Dragon voice recognition software and may include unintentional dictation  errors.    Sable Feil, PA-C 05/22/15 2026  Joanne Gavel, MD 05/23/15 [phone]

## 2015-05-22 NOTE — Discharge Instructions (Signed)

## 2015-05-22 NOTE — ED Notes (Signed)
Pt states she had wine coolers earlier today before the fall, pt constantly calling out on call bell for multiple things. RN notified PA

## 2015-05-22 NOTE — ED Notes (Signed)
Pt awaiting for ride at this time

## 2015-05-22 NOTE — ED Notes (Signed)
Pt comes from home for mechanical fall, pt c/o Rt left pain, pt has femur fracture on same leg in November. Pt denies any LOC or other injuries. Pt A&O

## 2015-05-22 NOTE — ED Notes (Signed)
See triage note.

## 2015-06-02 ENCOUNTER — Ambulatory Visit: Payer: Self-pay | Admitting: Family Medicine

## 2015-06-05 ENCOUNTER — Emergency Department
Admission: EM | Admit: 2015-06-05 | Discharge: 2015-06-05 | Disposition: A | Discharge status: Home / Self Care | Payer: Medicare Other | Attending: Emergency Medicine | Admitting: Emergency Medicine

## 2015-06-05 ENCOUNTER — Encounter: Payer: Self-pay | Admitting: Emergency Medicine

## 2015-06-05 DIAGNOSIS — Z5329 Procedure and treatment not carried out because of patient's decision for other reasons: Secondary | ICD-10-CM

## 2015-06-05 DIAGNOSIS — F10229 Alcohol dependence with intoxication, unspecified: Secondary | ICD-10-CM | POA: Insufficient documentation

## 2015-06-05 DIAGNOSIS — Z853 Personal history of malignant neoplasm of breast: Secondary | ICD-10-CM | POA: Diagnosis not present

## 2015-06-05 DIAGNOSIS — Z7982 Long term (current) use of aspirin: Secondary | ICD-10-CM | POA: Insufficient documentation

## 2015-06-05 DIAGNOSIS — M1990 Unspecified osteoarthritis, unspecified site: Secondary | ICD-10-CM | POA: Insufficient documentation

## 2015-06-05 DIAGNOSIS — Z5321 Procedure and treatment not carried out due to patient leaving prior to being seen by health care provider: Secondary | ICD-10-CM | POA: Insufficient documentation

## 2015-06-05 DIAGNOSIS — M79604 Pain in right leg: Secondary | ICD-10-CM | POA: Diagnosis present

## 2015-06-05 DIAGNOSIS — R52 Pain, unspecified: Secondary | ICD-10-CM | POA: Diagnosis not present

## 2015-06-05 DIAGNOSIS — F17210 Nicotine dependence, cigarettes, uncomplicated: Secondary | ICD-10-CM | POA: Diagnosis not present

## 2015-06-05 DIAGNOSIS — Z5320 Procedure and treatment not carried out because of patient's decision for unspecified reasons: Secondary | ICD-10-CM

## 2015-06-05 LAB — CBC
HEMATOCRIT: 39.9 % (ref 35.0–47.0)
Hemoglobin: 13.2 g/dL (ref 12.0–16.0)
MCH: 32 pg (ref 26.0–34.0)
MCHC: 33.2 g/dL (ref 32.0–36.0)
MCV: 96.6 fL (ref 80.0–100.0)
PLATELETS: 258 10*3/uL (ref 150–440)
RBC: 4.13 MIL/uL (ref 3.80–5.20)
RDW: 15.4 % — AB (ref 11.5–14.5)
WBC: 8.2 10*3/uL (ref 3.6–11.0)

## 2015-06-05 LAB — COMPREHENSIVE METABOLIC PANEL
ALK PHOS: 147 U/L — AB (ref 38–126)
ALT: 62 U/L — ABNORMAL HIGH (ref 14–54)
ANION GAP: 12 (ref 5–15)
AST: 90 U/L — ABNORMAL HIGH (ref 15–41)
Albumin: 3.7 g/dL (ref 3.5–5.0)
BILIRUBIN TOTAL: 0.6 mg/dL (ref 0.3–1.2)
BUN: 8 mg/dL (ref 6–20)
CALCIUM: 9.2 mg/dL (ref 8.9–10.3)
CO2: 21 mmol/L — AB (ref 22–32)
Chloride: 104 mmol/L (ref 101–111)
Creatinine, Ser: 0.55 mg/dL (ref 0.44–1.00)
Glucose, Bld: 125 mg/dL — ABNORMAL HIGH (ref 65–99)
Potassium: 2.7 mmol/L — CL (ref 3.5–5.1)
SODIUM: 137 mmol/L (ref 135–145)
TOTAL PROTEIN: 8.1 g/dL (ref 6.5–8.1)

## 2015-06-05 LAB — ETHANOL: ALCOHOL ETHYL (B): 242 mg/dL — AB (ref <5)

## 2015-06-05 MED ORDER — KETOROLAC TROMETHAMINE 30 MG/ML IJ SOLN
30.0000 mg | Freq: Once | INTRAMUSCULAR | Status: AC
  Administered 2015-06-05: 30 mg via INTRAMUSCULAR
  Filled 2015-06-05: qty 1

## 2015-06-05 MED ORDER — POTASSIUM CHLORIDE CRYS ER 20 MEQ PO TBCR
20.0000 meq | EXTENDED_RELEASE_TABLET | Freq: Once | ORAL | Status: AC
  Administered 2015-06-05: 20 meq via ORAL
  Filled 2015-06-05: qty 1

## 2015-06-05 NOTE — ED Notes (Signed)
Pt. States she fell in bathroom at home 1 month ago.  Pt. States continued rt. Leg pain.  Pt. States ETOH consumption today.  Pt. Also states she is upset about mother going into a care facility.

## 2015-06-05 NOTE — ED Provider Notes (Signed)
06/05/2015 at 8:38 PM:  The patient requested to leave.  I considered this to be leaving against medical advice. I personally discussed the following with them:   That they currently had a medical condition of chronic right hip pain and I am concerned that they may have exacerbation of chronic pain in association with alcohol usage.   1)  My proposed course of evaluation and treatment includes, but is not limited to, ordering a CT evaluation of the right hip, I am pain control, reviewed the patient's laboratory work which was found to have hypokalemia and she was given oral potassium.  Benefits of staying include possible diagnosis or excluding of *completing the CAT scan of the hip to assess for source of chronic pain or an alternative serious condition such as *hip pathology which if identified early would lead to appropriate intervention in a timely manner lessening the burden of disability and death.  2) Risks of leaving before this had been completed include: misdiagnosis, worsening illness leading up to and including prolonged or permanent disability or death.  Specific risks pertinent, but not all inclusive, of their current medical condition include but are not limited to , fracture.  I also discussed alternatives including *outpatient follow-up with orthopedic surgeon  Despite this they stated they wanted to leave due to the family requested to take the patient home and refused further evaluation, treatment, or admission at this time.   They appeared clinically sober, were mentating appropriately, were free from distracting injury, had adequately controlled acute pain, appeared to have intact insight, judgment, and reason, and in my opinion had the capacity to make this decision.  Specifically, they were able to verbally state back in a coherent manner their current medical condition/current diagnosis, the proposed course of evaluation and/or treatment, and the risks, benefits, and  alternatives of treatment versus leaving against medical advice.   They understand that they may return to seek medical attention here at ANY time they want.  I strongly advised them to return to the Emergency Department immediately if they experience any new or worsening symptoms that concern them, or simply if they reconsider continued evaluation and/or treatment as previously discussed.  This would be without any repercussions, though they understand they likely will need to wait again in the Emergency Department if other patients are in front of them, rather than being brought straight back.  They understood this is another advantage of staying, but still insisted upon leaving.  I recommended they follow-up with follow-up with orthopedic surgery at the earliest available opportunity/appointment for further evaluation and treatment.   The patient was discharged against medical advice.  They  accept written discharge instructions.    Janet, Zuniga #[id number] (59 y.o. F) [id number]        Lab Results       Comprehensive metabolic panel (Final result)Abnormal Component (Lab Inquiry)    Collection Time Result Time NA K CL CO2 GLUCOSE   06/05/15 19:33:00 06/05/15 19:57:09 137  2.7 (LL)    (Comment)    CRITICAL RESULT CALLED TO, READ BACK BY AND VERIFIED WITH LAUREN CLOUDIN AT 2000 06/05/15.PMH     104 21 (L) 125 (H)      Collection Time Result Time BUN Creatinine, Ser CALCIUM PROTEIN Albumin   06/05/15 19:33:00 06/05/15 19:57:09 8 0.55 9.2 8.1 3.7      Collection Time Result Time AST ALT ALK PHOS BILI TOTL GFR calc non Af Amer   06/05/15 19:33:00 06/05/15 19:57:09 90 (H) 62 (  H) 147 (H) 0.6 >60      Collection Time Result Time GFR calc Af Amer Anion gap   06/05/15 19:33:00 06/05/15 19:57:09  >60    (Comment)    (NOTE)  The eGFR has been calculated using the CKD EPI equation.  This calculation has not been validated in all clinical situations.  eGFR's  persistently <60 mL/min signify possible Chronic Kidney  Disease.       12             Ethanol (Final result)Abnormal Component (Lab Inquiry)    Collection Time Result Time Alcohol, Ethyl (B)   06/05/15 19:33:00 06/05/15 20:03:25  242 (H)    (Comment)        LOWEST DETECTABLE LIMIT FOR  SERUM ALCOHOL IS 5 mg/dL  FOR MEDICAL PURPOSES ONLY                   cbc (Final result)Abnormal Component (Lab Inquiry)    Collection Time Result Time WBC RBC HGB HCT MCV   06/05/15 19:33:00 06/05/15 19:43:47 8.2 4.13 13.2 39.9 96.6      Collection Time Result Time MCH MCHC RDW PLT   06/05/15 19:33:00 06/05/15 19:43:47 32.0 33.2 15.4 (H) 258           Imaging Results    None    ECG Results    None     Advance diet supplement potassium. The family was informed of all the studies that were planning to perform a nail appeared to be aware of. The family due to the patient's inebriation requested to take the patient home at this point.  Daymon Larsen, MD 06/05/15 2042

## 2015-06-05 NOTE — ED Notes (Addendum)
Pt presents to ED with right leg pain, states fell in January and is now demanding to "have an MRI right now" Pt states she will "not leave this facility until she gets one" Pt clearly intoxicated, slurring speech at this time. Pt states pain 10/10, denies any other s/s. Vitals stable, NAD noted. Pt placed on bed alarm, yellow magnet placed in doorway at this time.

## 2015-06-05 NOTE — ED Notes (Signed)
MD at bedside. 

## 2015-06-05 NOTE — ED Notes (Signed)
Pt refusing to have CT done at this time, MD Marcelene Butte made aware and verbalized understanding. SEE orders.

## 2015-06-05 NOTE — ED Notes (Addendum)
Pt left AMA at this time, family arrived to room to pick pt up. Pt refused to sign or wait for papers. Pt's niece wheeled pt away at this time. Pt's family made aware of situation, meds given, and pt refusal for CT. Family verbalized understanding and apologized for pt's behavior and language.

## 2015-06-05 NOTE — ED Notes (Signed)
Pt cussing this RN at this time due to lack of pain meds given. Pt made aware torodol will aid with the pain, pt states "I don't even care that's not real pain meds, y'all don't know anything" Pt on the phone with friend/family requesting to be picked up.

## 2015-06-08 ENCOUNTER — Ambulatory Visit: Payer: Self-pay | Admitting: Family Medicine

## 2015-06-22 DIAGNOSIS — M25551 Pain in right hip: Secondary | ICD-10-CM | POA: Diagnosis not present

## 2015-06-22 DIAGNOSIS — M1990 Unspecified osteoarthritis, unspecified site: Secondary | ICD-10-CM | POA: Diagnosis not present

## 2015-06-22 DIAGNOSIS — M79651 Pain in right thigh: Secondary | ICD-10-CM | POA: Diagnosis not present

## 2015-06-22 DIAGNOSIS — S72331S Displaced oblique fracture of shaft of right femur, sequela: Secondary | ICD-10-CM | POA: Diagnosis not present

## 2015-06-22 DIAGNOSIS — M9701XA Periprosthetic fracture around internal prosthetic right hip joint, initial encounter: Secondary | ICD-10-CM | POA: Diagnosis not present

## 2015-06-22 DIAGNOSIS — M79604 Pain in right leg: Secondary | ICD-10-CM | POA: Diagnosis not present

## 2015-06-22 DIAGNOSIS — M810 Age-related osteoporosis without current pathological fracture: Secondary | ICD-10-CM | POA: Diagnosis not present

## 2015-06-23 DIAGNOSIS — M9701XA Periprosthetic fracture around internal prosthetic right hip joint, initial encounter: Secondary | ICD-10-CM | POA: Diagnosis not present

## 2015-07-12 ENCOUNTER — Ambulatory Visit: Payer: Self-pay | Admitting: Family Medicine

## 2015-07-12 DIAGNOSIS — S72334D Nondisplaced oblique fracture of shaft of right femur, subsequent encounter for closed fracture with routine healing: Secondary | ICD-10-CM | POA: Diagnosis not present

## 2015-07-12 DIAGNOSIS — S72334A Nondisplaced oblique fracture of shaft of right femur, initial encounter for closed fracture: Secondary | ICD-10-CM | POA: Diagnosis not present

## 2015-07-12 DIAGNOSIS — S79001A Unspecified physeal fracture of upper end of right femur, initial encounter for closed fracture: Secondary | ICD-10-CM | POA: Diagnosis not present

## 2015-07-12 DIAGNOSIS — Z4789 Encounter for other orthopedic aftercare: Secondary | ICD-10-CM | POA: Diagnosis not present

## 2015-07-21 DIAGNOSIS — Z853 Personal history of malignant neoplasm of breast: Secondary | ICD-10-CM | POA: Diagnosis not present

## 2015-07-21 DIAGNOSIS — Z9181 History of falling: Secondary | ICD-10-CM | POA: Diagnosis not present

## 2015-07-21 DIAGNOSIS — G894 Chronic pain syndrome: Secondary | ICD-10-CM | POA: Diagnosis not present

## 2015-07-21 DIAGNOSIS — M459 Ankylosing spondylitis of unspecified sites in spine: Secondary | ICD-10-CM | POA: Diagnosis not present

## 2015-07-21 DIAGNOSIS — M4806 Spinal stenosis, lumbar region: Secondary | ICD-10-CM | POA: Diagnosis not present

## 2015-07-21 DIAGNOSIS — M9701XD Periprosthetic fracture around internal prosthetic right hip joint, subsequent encounter: Secondary | ICD-10-CM | POA: Diagnosis not present

## 2015-07-21 DIAGNOSIS — S72334D Nondisplaced oblique fracture of shaft of right femur, subsequent encounter for closed fracture with routine healing: Secondary | ICD-10-CM | POA: Diagnosis not present

## 2015-07-21 DIAGNOSIS — M810 Age-related osteoporosis without current pathological fracture: Secondary | ICD-10-CM | POA: Diagnosis not present

## 2015-07-21 DIAGNOSIS — I7300 Raynaud's syndrome without gangrene: Secondary | ICD-10-CM | POA: Diagnosis not present

## 2015-07-21 DIAGNOSIS — M4310 Spondylolisthesis, site unspecified: Secondary | ICD-10-CM | POA: Diagnosis not present

## 2015-07-27 DIAGNOSIS — S79101A Unspecified physeal fracture of lower end of right femur, initial encounter for closed fracture: Secondary | ICD-10-CM | POA: Diagnosis not present

## 2015-07-27 DIAGNOSIS — S72331D Displaced oblique fracture of shaft of right femur, subsequent encounter for closed fracture with routine healing: Secondary | ICD-10-CM | POA: Diagnosis not present

## 2015-07-27 DIAGNOSIS — S72301D Unspecified fracture of shaft of right femur, subsequent encounter for closed fracture with routine healing: Secondary | ICD-10-CM | POA: Diagnosis not present

## 2015-07-27 DIAGNOSIS — Z853 Personal history of malignant neoplasm of breast: Secondary | ICD-10-CM | POA: Diagnosis not present

## 2015-07-27 DIAGNOSIS — Z8781 Personal history of (healed) traumatic fracture: Secondary | ICD-10-CM | POA: Diagnosis not present

## 2015-07-27 DIAGNOSIS — S79001A Unspecified physeal fracture of upper end of right femur, initial encounter for closed fracture: Secondary | ICD-10-CM | POA: Diagnosis not present

## 2015-08-02 ENCOUNTER — Emergency Department
Admission: EM | Admit: 2015-08-02 | Discharge: 2015-08-02 | Disposition: A | Discharge status: Home / Self Care | Payer: Medicare Other | Attending: Emergency Medicine | Admitting: Emergency Medicine

## 2015-08-02 ENCOUNTER — Encounter: Payer: Self-pay | Admitting: Emergency Medicine

## 2015-08-02 DIAGNOSIS — G8929 Other chronic pain: Secondary | ICD-10-CM | POA: Insufficient documentation

## 2015-08-02 DIAGNOSIS — F17210 Nicotine dependence, cigarettes, uncomplicated: Secondary | ICD-10-CM | POA: Insufficient documentation

## 2015-08-02 DIAGNOSIS — M79604 Pain in right leg: Secondary | ICD-10-CM | POA: Insufficient documentation

## 2015-08-02 DIAGNOSIS — Z79899 Other long term (current) drug therapy: Secondary | ICD-10-CM | POA: Diagnosis not present

## 2015-08-02 DIAGNOSIS — Z969 Presence of functional implant, unspecified: Secondary | ICD-10-CM | POA: Insufficient documentation

## 2015-08-02 DIAGNOSIS — F1290 Cannabis use, unspecified, uncomplicated: Secondary | ICD-10-CM | POA: Insufficient documentation

## 2015-08-02 DIAGNOSIS — Z853 Personal history of malignant neoplasm of breast: Secondary | ICD-10-CM | POA: Insufficient documentation

## 2015-08-02 DIAGNOSIS — Z7982 Long term (current) use of aspirin: Secondary | ICD-10-CM | POA: Insufficient documentation

## 2015-08-02 NOTE — ED Provider Notes (Signed)
[facility] Emergency Department Provider Note  ____________________________________________  Time seen: Approximately 5:03 PM  I have reviewed the triage vital signs and the nursing notes.   HISTORY  Chief Complaint Leg Pain    HPI Janet Zuniga is a 60 y.o. female presents for evaluation of right leg pain. Patient states the pain is the worst pain that she's ever had. She describes her pain as 10 over 10 nonradiating. States never gets better. Has a past medical history displaced spiral fracture of the right femur with rod and screws in place.   Past Medical History:  Diagnosis Date  . Anxiety   . Arthritis   . Blood in stool   . Breast cancer ([place]) 2010   Left breast- chemo/radiation  . Hepatitis C 2007  . Hip fracture ([place])   . History of blood transfusion     Patient Active Problem List   Diagnosis Date Noted  . Displaced spiral fracture of shaft of right femur ([place]) 01/12/2015  . Anxiety 01/12/2015  . Hypokalemia 01/12/2015  . Major depression ([place]) 10/27/2014  . Substance induced mood disorder ([place]) 10/27/2014  . History of breast cancer in female 12/23/2013  . Chronic pain 12/23/2013  . Ankylosing spondylitis of lumbosacral region ([place]) 12/23/2013  . Depression 12/23/2013    Past Surgical History:  Procedure Laterality Date  . BREAST BIOPSY Right 1983   neg  . BREAST EXCISIONAL BIOPSY Left 2010  . BREAST LUMPECTOMY Left 2010  . FEMUR FRACTURE SURGERY    . FOOT SURGERY  2000 and 2003  . INTRAMEDULLARY (IM) NAIL INTERTROCHANTERIC Right 01/12/2015   Procedure: INTRAMEDULLARY (IM) NAIL INTERTROCHANTRIC;  Surgeon: Earnestine Leys, MD;  Location: [facility] ORS;  Service: Orthopedics;  Laterality: Right;  . KNEE SURGERY Left 2004    Prior to Admission medications   Medication Sig Start Date End Date Taking? Authorizing Provider  ALPRAZolam Duanne Moron) 1 MG tablet Take 1 mg by mouth 4 (four) times daily as needed for anxiety.     Historical  Provider, MD  aspirin EC 325 MG tablet Take 1 tablet (325 mg total) by mouth 2 (two) times daily. 01/14/15   Earnestine Leys, MD  busPIRone (BUSPAR) 5 MG tablet Take 5 mg by mouth 3 (three) times daily.    Historical Provider, MD  carisoprodol (SOMA) 350 MG tablet TAKE ONE TABLET BY MOUTH THREE TIMES DAILY AS NEEDED FOR MUSCLE SPASMS 12/24/14   Historical Provider, MD  celecoxib (CELEBREX) 200 MG capsule Take 1 capsule (200 mg total) by mouth daily. 01/14/15   Fritzi Mandes, MD  ferrous sulfate 325 (65 FE) MG tablet Take 1 tablet (325 mg total) by mouth daily with breakfast. 01/14/15   Fritzi Mandes, MD  FLUoxetine (PROZAC) 40 MG capsule Take 40 mg by mouth daily.     Historical Provider, MD  Oxycodone HCl 10 MG TABS Take 1 tablet (10 mg total) by mouth 3 (three) times daily. 04/29/15   Arlyss Repress, PA-C  oxyCODONE-acetaminophen (ROXICET) 5-325 MG tablet Take 1 tablet by mouth every 8 (eight) hours as needed for moderate pain or severe pain. 01/14/15   Fritzi Mandes, MD  sulfamethoxazole-trimethoprim (BACTRIM DS,SEPTRA DS) 800-160 MG tablet Take 1 tablet by mouth 2 (two) times daily. 04/29/15   Arlyss Repress, PA-C  traZODone (DESYREL) 150 MG tablet Take 150 mg by mouth at bedtime.     Historical Provider, MD    Allergies Penicillins  Family History  Problem Relation Age of Onset  . Arthritis  Mother   . Stroke Mother   . Heart disease Mother   . Hypertension Mother   . Hypertension Father   . Hypertension Brother   . Heart disease Maternal Aunt   . Breast cancer Neg Hx     Social History Social History  Substance Use Topics  . Smoking status: Current Every Day Smoker    Packs/day: 1.00    Types: Cigarettes  . Smokeless tobacco: Never Used  . Alcohol use Yes    Review of Systems Constitutional: No fever/chills Cardiovascular: Denies chest pain. Respiratory: Denies shortness of breath. Musculoskeletal: Positive for right upper leg pain. Skin: Negative for rash. Neurological: Negative  for headaches, focal weakness or numbness.  10-point ROS otherwise negative.  ____________________________________________   PHYSICAL EXAM:  VITAL SIGNS: ED Triage Vitals  Enc Vitals Group     BP 08/02/15 1617 92/62     Pulse Rate 08/02/15 1617 84     Resp 08/02/15 1617 16     Temp 08/02/15 1617 98.3 F (36.8 C)     Temp Source 08/02/15 1617 Oral     SpO2 08/02/15 1617 97 %     Weight 08/02/15 1617 145 lb (65.8 kg)     Height 08/02/15 1617 5\' 1"  (1.549 m)     Head Circumference --      Peak Flow --      Pain Score 08/02/15 1622 10     Pain Loc --      Pain Edu? --      Excl. in [place]? --     Constitutional: Alert and oriented. Well appearing and in no acute distress. Cardiovascular: Normal rate, regular rhythm. Grossly normal heart sounds.  Good peripheral circulation. Respiratory: Normal respiratory effort.  No retractions. Lungs CTAB. Musculoskeletal: Point tenderness to the right upper thigh. Distally neurovascularly intact. Neurologic:  Normal speech and language. No gross focal neurologic deficits are appreciated. No gait instability. Skin:  Skin is warm, dry and intact. No rash noted. Psychiatric: Mood and affect are normal. Speech and behavior are normal.  ____________________________________________   LABS (all labs ordered are listed, but only abnormal results are displayed)  Labs Reviewed - No data to display ____________________________________________  EKG   ____________________________________________  RADIOLOGY  Deferred ____________________________________________   PROCEDURES  Procedure(s) performed: None  Critical Care performed: No  ____________________________________________   INITIAL IMPRESSION / ASSESSMENT AND PLAN / ED COURSE  Pertinent labs & imaging results that were available during my care of the patient were reviewed by me and considered in my medical decision making (see chart for details).   Review of care everywhere  demonstrates a telephone conversation from [facility]. Telephone Encounter - Rexford Maus, CNA - 08/02/2015 1:04 PM EDT Patient called requesting a refill on her pain medication. Patient was advised that she was given enough medication to carry her until after the PET scan at her last appointment and would not be given any more at this time. She stated she understood but did not agree with the dosage she was prescribed as she felt she needed more. She stated she would talk to the Dr about increasing her dosage after the results of her PET scan.       Clinical Course   Chronic right leg pain. Patient scheduled to have PET scan done. No prescriptions given for pain at this visit. ____________________________________________   FINAL CLINICAL IMPRESSION(S) / ED DIAGNOSES  Final diagnoses:  Right leg pain     This chart was dictated using voice  recognition software/Dragon. Despite best efforts to proofread, errors can occur which can change the meaning. Any change was purely unintentional.    Arlyss Repress, PA-C 08/02/15 [place], MD 08/02/15 1740

## 2015-08-02 NOTE — ED Triage Notes (Signed)
States has had chronic pain R thigh since May when she fell in the bathroom. States she has constant pain "and it never gets better"

## 2015-08-03 ENCOUNTER — Emergency Department
Admission: EM | Admit: 2015-08-03 | Discharge: 2015-08-03 | Disposition: A | Discharge status: Home / Self Care | Payer: Medicare Other | Attending: Emergency Medicine | Admitting: Emergency Medicine

## 2015-08-03 ENCOUNTER — Encounter: Payer: Self-pay | Admitting: Emergency Medicine

## 2015-08-03 DIAGNOSIS — F1010 Alcohol abuse, uncomplicated: Secondary | ICD-10-CM

## 2015-08-03 DIAGNOSIS — F10120 Alcohol abuse with intoxication, uncomplicated: Secondary | ICD-10-CM | POA: Insufficient documentation

## 2015-08-03 DIAGNOSIS — F1994 Other psychoactive substance use, unspecified with psychoactive substance-induced mood disorder: Secondary | ICD-10-CM | POA: Diagnosis present

## 2015-08-03 DIAGNOSIS — G8929 Other chronic pain: Secondary | ICD-10-CM | POA: Diagnosis present

## 2015-08-03 DIAGNOSIS — F10129 Alcohol abuse with intoxication, unspecified: Secondary | ICD-10-CM | POA: Diagnosis not present

## 2015-08-03 DIAGNOSIS — F1910 Other psychoactive substance abuse, uncomplicated: Secondary | ICD-10-CM | POA: Insufficient documentation

## 2015-08-03 DIAGNOSIS — F329 Major depressive disorder, single episode, unspecified: Secondary | ICD-10-CM | POA: Diagnosis not present

## 2015-08-03 DIAGNOSIS — Z79899 Other long term (current) drug therapy: Secondary | ICD-10-CM | POA: Diagnosis not present

## 2015-08-03 DIAGNOSIS — F17210 Nicotine dependence, cigarettes, uncomplicated: Secondary | ICD-10-CM | POA: Diagnosis not present

## 2015-08-03 DIAGNOSIS — F1310 Sedative, hypnotic or anxiolytic abuse, uncomplicated: Secondary | ICD-10-CM

## 2015-08-03 DIAGNOSIS — F10920 Alcohol use, unspecified with intoxication, uncomplicated: Secondary | ICD-10-CM

## 2015-08-03 DIAGNOSIS — F32A Depression, unspecified: Secondary | ICD-10-CM

## 2015-08-03 DIAGNOSIS — Z7982 Long term (current) use of aspirin: Secondary | ICD-10-CM | POA: Insufficient documentation

## 2015-08-03 DIAGNOSIS — Y906 Blood alcohol level of 120-199 mg/100 ml: Secondary | ICD-10-CM | POA: Diagnosis not present

## 2015-08-03 LAB — CBC
HEMATOCRIT: 39.5 % (ref 35.0–47.0)
Hemoglobin: 14 g/dL (ref 12.0–16.0)
MCH: 33.7 pg (ref 26.0–34.0)
MCHC: 35.4 g/dL (ref 32.0–36.0)
MCV: 95.4 fL (ref 80.0–100.0)
Platelets: 232 10*3/uL (ref 150–440)
RBC: 4.14 MIL/uL (ref 3.80–5.20)
RDW: 15.9 % — AB (ref 11.5–14.5)
WBC: 7.6 10*3/uL (ref 3.6–11.0)

## 2015-08-03 LAB — URINE DRUG SCREEN, QUALITATIVE (ARMC ONLY)
Amphetamines, Ur Screen: NOT DETECTED
BARBITURATES, UR SCREEN: NOT DETECTED
BENZODIAZEPINE, UR SCRN: POSITIVE — AB
Cannabinoid 50 Ng, Ur ~~LOC~~: NOT DETECTED
Cocaine Metabolite,Ur ~~LOC~~: NOT DETECTED
MDMA (Ecstasy)Ur Screen: NOT DETECTED
METHADONE SCREEN, URINE: NOT DETECTED
OPIATE, UR SCREEN: NOT DETECTED
Phencyclidine (PCP) Ur S: NOT DETECTED
Tricyclic, Ur Screen: NOT DETECTED

## 2015-08-03 LAB — ETHANOL: Alcohol, Ethyl (B): 147 mg/dL — ABNORMAL HIGH (ref <5)

## 2015-08-03 LAB — COMPREHENSIVE METABOLIC PANEL
ALK PHOS: 115 U/L (ref 38–126)
ALT: 37 U/L (ref 14–54)
ANION GAP: 14 (ref 5–15)
AST: 42 U/L — ABNORMAL HIGH (ref 15–41)
Albumin: 3.8 g/dL (ref 3.5–5.0)
BILIRUBIN TOTAL: 0.4 mg/dL (ref 0.3–1.2)
BUN: 7 mg/dL (ref 6–20)
CALCIUM: 8.8 mg/dL — AB (ref 8.9–10.3)
CO2: 23 mmol/L (ref 22–32)
Chloride: 101 mmol/L (ref 101–111)
Creatinine, Ser: 0.56 mg/dL (ref 0.44–1.00)
GLUCOSE: 100 mg/dL — AB (ref 65–99)
Potassium: 2.9 mmol/L — ABNORMAL LOW (ref 3.5–5.1)
Sodium: 138 mmol/L (ref 135–145)
TOTAL PROTEIN: 8.1 g/dL (ref 6.5–8.1)

## 2015-08-03 LAB — SALICYLATE LEVEL

## 2015-08-03 LAB — ACETAMINOPHEN LEVEL

## 2015-08-03 MED ORDER — POTASSIUM CHLORIDE CRYS ER 20 MEQ PO TBCR
40.0000 meq | EXTENDED_RELEASE_TABLET | Freq: Once | ORAL | Status: AC
  Administered 2015-08-03: 40 meq via ORAL
  Filled 2015-08-03: qty 2

## 2015-08-03 NOTE — ED Notes (Signed)
Pt given sprite 

## 2015-08-03 NOTE — ED Provider Notes (Signed)
Progress note  11:53 AM 08/03/2015  Patient was evaluated by Dr. Weber Cooks from psychiatry who has decided patient does not meet inpatient criteria for admission. Patient was discharged home with instructions not to mix Xanax and alcohol, and she already has follow-up in place for outpatient treatment. Patient was told to return immediately if condition worsens.   Ruby Cola, MD 08/03/15 1153

## 2015-08-03 NOTE — Consult Note (Signed)
[facility]   Reason for Consult:  Consult for 60 year old woman with a history of anxiety and depression who called 911 because of a crying spell Referring Physician:  Lovena Le Patient Identification: Janet Zuniga MRN:  [id number] Principal Diagnosis: Substance induced mood disorder ([place]) Diagnosis:   Patient Active Problem List   Diagnosis Date Noted  . Alcohol abuse [F10.10] 08/03/2015  . Displaced spiral fracture of shaft of right femur ([place]) [S72.341A] 01/12/2015  . Anxiety [F41.9] 01/12/2015  . Hypokalemia [E87.6] 01/12/2015  . Major depression ([place]) [F32.9] 10/27/2014  . Substance induced mood disorder ([place]) [F19.94] 10/27/2014  . History of breast cancer in female [Z85.3] 12/23/2013  . Chronic pain [G89.29] 12/23/2013  . Ankylosing spondylitis of lumbosacral region ([place]) [M45.7] 12/23/2013  . Depression [F32.9] 12/23/2013    Total Time spent with patient: 1 hour  Subjective:   Janet Zuniga is a 60 y.o. female patient admitted with "I have a lot going on".  HPI:  Patient interviewed. Chart reviewed. Labs and vitals reviewed. Case discussed with ER physician and TTS. 60 year old woman called 38 yesterday because she was having a crying spell and couldn't stop. She was feeling distraught and overwhelmed by all of her stress. She says that she is feeling stressed out by her chronic pain and the fact that she is going to have to have surgery again soon. Her mother has Alzheimer's disease. She also has some family stress and yesterday was more specific that she is upset that her son won't let her interact with her grandson. She says that overall her mood feels down but she does not have any suicidal thoughts. Most of the time she is still able to function okay. Denies any psychotic symptoms. Patient says she is taking Prozac 40 mg a day and also Xanax that she takes 1 mg one or 2 times a day. She admits that she was drinking yesterday. She says that she had one  wine cooler. Her blood alcohol level on presentation was 147 which suggest possibly a little more alcohol than that.  Medical history: Multiple orthopedic complaints. History of back surgery. Says that she is going to get a PET scan again soon to deal with an orthopedic problem but she somewhat dreading it because of her history of cancer as well.  Social history: She lives by herself. Has regular contact with some social friends and also has family that she stays in some contact with although it seems like it stressful. Does not work outside the home.  Substance abuse history: Patient minimizes the degree to which alcohol and drugs of been a problem for her. She was intoxicated on presentation here and it sounds like that was probably a big part of why she was crying. Doesn't admit to abusing her Xanax or any other drugs in the past.  Past Psychiatric History: Patient has had several prior admissions to the psychiatric ward usually in the context of suicidal ideation voiced while intoxicated. She denies that she's ever actually tried to kill himself no history of violence. Has been taking Prozac and when necessary Xanax steadily for years. No history of psychosis.  Risk to Self: Suicidal Ideation: No Suicidal Intent: No Is patient at risk for suicide?: No Suicidal Plan?: No-Not Currently/Within Last 6 Months Access to Means: No What has been your use of drugs/alcohol within the last 12 months?: use of alcohol How many times?: 0 Other Self Harm Risks: denied Triggers for Past Attempts: None known Intentional Self Injurious  Behavior: None Risk to Others: Homicidal Ideation: No Thoughts of Harm to Others: No Current Homicidal Intent: No Current Homicidal Plan: No Access to Homicidal Means: No Identified Victim: None identified History of harm to others?: No Assessment of Violence: None Noted Violent Behavior Description: denied Does patient have access to weapons?: No Criminal Charges  Pending?: No Does patient have a court date: No Prior Inpatient Therapy: Prior Inpatient Therapy: Yes Prior Therapy Dates: 2010 Prior Therapy Facilty/Provider(s): [facility] Reason for Treatment: Suicidal Prior Outpatient Therapy: Prior Outpatient Therapy: Yes Prior Therapy Dates: 2010 Prior Therapy Facilty/Provider(s): [facility] Reason for Treatment: Depression Does patient have an ACCT team?: No Does patient have Intensive In-House Services?  : No Does patient have [facility] services? : No Does patient have P4CC services?: No  Past Medical History:  Past Medical History:  Diagnosis Date  . Anxiety   . Arthritis   . Blood in stool   . Breast cancer ([place]) 2010   Left breast- chemo/radiation  . Hepatitis C 2007  . [place] fracture ([place])   . History of blood transfusion     Past Surgical History:  Procedure Laterality Date  . BREAST BIOPSY Right 1983   neg  . BREAST EXCISIONAL BIOPSY Left 2010  . BREAST LUMPECTOMY Left 2010  . FEMUR FRACTURE SURGERY    . FOOT SURGERY  2000 and 2003  . INTRAMEDULLARY (IM) NAIL INTERTROCHANTERIC Right 01/12/2015   Procedure: INTRAMEDULLARY (IM) NAIL INTERTROCHANTRIC;  Surgeon: Earnestine Leys, MD;  Location: [facility] ORS;  Service: Orthopedics;  Laterality: Right;  . KNEE SURGERY Left 2004   Family History:  Family History  Problem Relation Age of Onset  . Arthritis Mother   . Stroke Mother   . Heart disease Mother   . Hypertension Mother   . Hypertension Father   . Hypertension Brother   . Heart disease Maternal Aunt   . Breast cancer Neg Hx    Family Psychiatric  History: Patient says that she has a brother with severe depression and substance abuse problems and has had suicide attempts in the past. Social History:  History  Alcohol Use  . Yes     History  Drug Use  . Types: Marijuana    Social History   Social History  . Marital status: Legally Separated    Spouse name: N/A  . Number of children: N/A  . Years of education: N/A   Social  History Main Topics  . Smoking status: Current Every Day Smoker    Packs/day: 1.00    Types: Cigarettes  . Smokeless tobacco: Never Used  . Alcohol use Yes  . Drug use:     Types: Marijuana  . Sexual activity: Not Asked   Other Topics Concern  . None   Social History Narrative  . None   Additional Social History:    Allergies:   Allergies  Allergen Reactions  . Penicillins Rash and Other (See Comments)    Has patient had a PCN reaction causing immediate rash, facial/tongue/throat swelling, SOB or lightheadedness with hypotension: Yes Has patient had a PCN reaction causing severe rash involving mucus membranes or skin necrosis: No Has patient had a PCN reaction that required hospitalization No Has patient had a PCN reaction occurring within the last 10 years: No If all of the above answers are "NO", then may proceed with Cephalosporin use.     Labs:  Results for orders placed or performed during the hospital encounter of 08/03/15 (from the past 48 hour(s))  Comprehensive metabolic  panel     Status: Abnormal   Collection Time: 08/03/15  2:33 AM  Result Value Ref Range   Sodium 138 135 - 145 mmol/L   Potassium 2.9 (L) 3.5 - 5.1 mmol/L   Chloride 101 101 - 111 mmol/L   CO2 23 22 - 32 mmol/L   Glucose, Bld 100 (H) 65 - 99 mg/dL   BUN 7 6 - 20 mg/dL   Creatinine, Ser 0.56 0.44 - 1.00 mg/dL   Calcium 8.8 (L) 8.9 - 10.3 mg/dL   Total Protein 8.1 6.5 - 8.1 g/dL   Albumin 3.8 3.5 - 5.0 g/dL   AST 42 (H) 15 - 41 U/L   ALT 37 14 - 54 U/L   Alkaline Phosphatase 115 38 - 126 U/L   Total Bilirubin 0.4 0.3 - 1.2 mg/dL   GFR calc non Af Amer >60 >60 mL/min   GFR calc Af Amer >60 >60 mL/min    Comment: (NOTE) The eGFR has been calculated using the CKD EPI equation. This calculation has not been validated in all clinical situations. eGFR's persistently <60 mL/min signify possible Chronic Kidney Disease.    Anion gap 14 5 - 15  Ethanol     Status: Abnormal   Collection Time:  08/03/15  2:33 AM  Result Value Ref Range   Alcohol, Ethyl (B) 147 (H) <5 mg/dL    Comment:        LOWEST DETECTABLE LIMIT FOR SERUM ALCOHOL IS 5 mg/dL FOR MEDICAL PURPOSES ONLY   Salicylate level     Status: None   Collection Time: 08/03/15  2:33 AM  Result Value Ref Range   Salicylate Lvl <2.8 2.8 - 30.0 mg/dL  Acetaminophen level     Status: Abnormal   Collection Time: 08/03/15  2:33 AM  Result Value Ref Range   Acetaminophen (Tylenol), Serum <10 (L) 10 - 30 ug/mL    Comment:        THERAPEUTIC CONCENTRATIONS VARY SIGNIFICANTLY. A RANGE OF 10-30 ug/mL MAY BE AN EFFECTIVE CONCENTRATION FOR MANY PATIENTS. HOWEVER, SOME ARE BEST TREATED AT CONCENTRATIONS OUTSIDE THIS RANGE. ACETAMINOPHEN CONCENTRATIONS >150 ug/mL AT 4 HOURS AFTER INGESTION AND >50 ug/mL AT 12 HOURS AFTER INGESTION ARE OFTEN ASSOCIATED WITH TOXIC REACTIONS.   cbc     Status: Abnormal   Collection Time: 08/03/15  2:33 AM  Result Value Ref Range   WBC 7.6 3.6 - 11.0 K/uL   RBC 4.14 3.80 - 5.20 MIL/uL   Hemoglobin 14.0 12.0 - 16.0 g/dL   HCT 39.5 35.0 - 47.0 %   MCV 95.4 80.0 - 100.0 fL   MCH 33.7 26.0 - 34.0 pg   MCHC 35.4 32.0 - 36.0 g/dL   RDW 15.9 (H) 11.5 - 14.5 %   Platelets 232 150 - 440 K/uL  Urine Drug Screen, Qualitative     Status: Abnormal   Collection Time: 08/03/15 10:16 AM  Result Value Ref Range   Tricyclic, Ur Screen NONE DETECTED NONE DETECTED   Amphetamines, Ur Screen NONE DETECTED NONE DETECTED   MDMA (Ecstasy)Ur Screen NONE DETECTED NONE DETECTED   Cocaine Metabolite,Ur [place] NONE DETECTED NONE DETECTED   Opiate, Ur Screen NONE DETECTED NONE DETECTED   Phencyclidine (PCP) Ur S NONE DETECTED NONE DETECTED   Cannabinoid 50 Ng, Ur [place] NONE DETECTED NONE DETECTED   Barbiturates, Ur Screen NONE DETECTED NONE DETECTED   Benzodiazepine, Ur Scrn POSITIVE (A) NONE DETECTED   Methadone Scn, Ur NONE DETECTED NONE DETECTED    Comment: (NOTE)  681  Tricyclics, urine               Cutoff 1000  ng/mL 200  Amphetamines, urine             Cutoff 1000 ng/mL 300  MDMA (Ecstasy), urine           Cutoff 500 ng/mL 400  Cocaine Metabolite, urine       Cutoff 300 ng/mL 500  Opiate, urine                   Cutoff 300 ng/mL 600  Phencyclidine (PCP), urine      Cutoff 25 ng/mL 700  Cannabinoid, urine              Cutoff 50 ng/mL 800  Barbiturates, urine             Cutoff 200 ng/mL 900  Benzodiazepine, urine           Cutoff 200 ng/mL 1000 Methadone, urine                Cutoff 300 ng/mL 1100 1200 The urine drug screen provides only a preliminary, unconfirmed 1300 analytical test result and should not be used for non-medical 1400 purposes. Clinical consideration and professional judgment should 1500 be applied to any positive drug screen result due to possible 1600 interfering substances. A more specific alternate chemical method 1700 must be used in order to obtain a confirmed analytical result.  1800 Gas chromato graphy / mass spectrometry (GC/MS) is the preferred 1900 confirmatory method.     No current facility-administered medications for this encounter.    Current Outpatient Prescriptions  Medication Sig Dispense Refill  . ALPRAZolam (XANAX) 1 MG tablet Take 1 mg by mouth 4 (four) times daily as needed for anxiety.     Marland Kitchen aspirin EC 325 MG tablet Take 1 tablet (325 mg total) by mouth 2 (two) times daily. 30 tablet 0  . busPIRone (BUSPAR) 5 MG tablet Take 5 mg by mouth 3 (three) times daily.    . carisoprodol (SOMA) 350 MG tablet TAKE ONE TABLET BY MOUTH THREE TIMES DAILY AS NEEDED FOR MUSCLE SPASMS  1  . celecoxib (CELEBREX) 200 MG capsule Take 1 capsule (200 mg total) by mouth daily. 30 capsule 0  . ferrous sulfate 325 (65 FE) MG tablet Take 1 tablet (325 mg total) by mouth daily with breakfast. 30 tablet 3  . FLUoxetine (PROZAC) 40 MG capsule Take 40 mg by mouth daily.     . Oxycodone HCl 10 MG TABS Take 1 tablet (10 mg total) by mouth 3 (three) times daily. 20 tablet 0  .  traZODone (DESYREL) 150 MG tablet Take 150 mg by mouth at bedtime.       Musculoskeletal: Strength & Muscle Tone: decreased Gait & Station: unsteady Patient leans: N/A  Psychiatric Specialty Exam: Physical Exam  Constitutional: She appears well-developed. She appears distressed.  HENT:  Head: Normocephalic and atraumatic.  Eyes: Conjunctivae are normal. Pupils are equal, round, and reactive to light.  Neck: Normal range of motion.  Cardiovascular: Normal heart sounds.   Respiratory: Effort normal. No respiratory distress.  GI: Soft.  Musculoskeletal: Normal range of motion.       Back:  Neurological: She is alert.  Skin: Skin is warm and dry.  Psychiatric: Her mood appears anxious. Her affect is blunt. Her speech is delayed. She is slowed. Thought content is not delusional. Cognition and memory are normal. She expresses impulsivity. She expresses  no suicidal ideation.    Review of Systems  HENT: Negative.   Eyes: Negative.   Respiratory: Negative.   Cardiovascular: Negative.   Gastrointestinal: Negative.   Musculoskeletal: Positive for back pain.  Skin: Negative.   Neurological: Positive for weakness.  Psychiatric/Behavioral: Positive for depression and substance abuse. Negative for hallucinations, memory loss and suicidal ideas. The patient is nervous/anxious.     Blood pressure 98/63, pulse 65, temperature 97.5 F (36.4 C), temperature source Oral, resp. rate 18, SpO2 96 %.There is no height or weight on file to calculate BMI.  General Appearance: Disheveled  Eye Contact:  Fair  Speech:  Slow  Volume:  Decreased  Mood:  Anxious  Affect:  Constricted  Thought Process:  Coherent  Orientation:  Full (Time, Place, and Person)  Thought Content:  Logical  Suicidal Thoughts:  No  Homicidal Thoughts:  No  Memory:  Immediate;   Good Recent;   Fair Remote;   Fair  Judgement:  Fair  Insight:  Good  Psychomotor Activity:  Decreased  Concentration:  Concentration: Fair    Recall:  [employer] of Knowledge:  Fair  Language:  Fair  Akathisia:  No  Handed:  Right  AIMS (if indicated):     Assets:  Communication Skills Desire for Improvement Financial Resources/Insurance Housing Resilience  ADL's:  Intact  Cognition:  WNL  Sleep:        Treatment Plan Summary: Plan 60 year old woman with a history of chronic depression and anxiety. Yesterday it sounds like she probably was drinking a little bit more than usual and also using her Xanax. Got herself into a crying fit and felt overwhelmed. Today she is calm and stable. Lucid thinking. No evidence of suicidality. Patient does not require inpatient treatment. She was encouraged to not drink and use Xanax at the same time for the obvious reasons. She can be discharged and will be followed up by her regular primary care doctor as an outpatient. Case reviewed with the ER doctor.  Disposition: Patient does not meet criteria for psychiatric inpatient admission. Supportive therapy provided about ongoing stressors.  Alethia Berthold, MD 08/03/2015 12:11 PM
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
## 2015-08-03 NOTE — ED Notes (Signed)
Pt given phone to call to arrange transportation to [facility] next week for PET scan.

## 2015-08-03 NOTE — ED Notes (Signed)
Report given to Mary RN.

## 2015-08-03 NOTE — ED Notes (Signed)
Report from [facility] She hasnt seen this Patient.

## 2015-08-03 NOTE — ED Notes (Signed)
Pt awake; given breakfast and cup to obtain urine sample.

## 2015-08-03 NOTE — ED Notes (Signed)
Pt states she is not sure why she came here, that she was "just scared." Apparently pt has some health problems and is facing a PET scan. States she would rather be at home in her recliner.

## 2015-08-03 NOTE — ED Triage Notes (Signed)
Pt to triage via w/c, brought in by [place] [facility] who reports pt called for transport to hospital for evaluation of depression; +ETOH; pt seen earlier for right leg pain

## 2015-08-03 NOTE — ED Provider Notes (Signed)
[facility] Emergency Department Provider Note   ____________________________________________   First MD Initiated Contact with Patient 08/03/15 0402     (approximate)  I have reviewed the triage vital signs and the nursing notes.   HISTORY  Chief Complaint Mental Health Problem    HPI Janet Zuniga is a 60 y.o. female who was brought in by the police the patient had been drinking and taking her Xanax. The patient is also depressed but denies suicidal and homicidal ideation. The patient reports that she has a lot going on with life and she is depressed. She reports that she struggling to deal with things in her life. She reports that she has a hard time taking care of herself as well. The patient denies any suicidal or homicidal ideation. She reports that when she was drinking and taking her medication she is not trying to hurt herself. The patient had a watermelon [place]. She reports that she took 3 Xanax in total today which showed normal amount. The patient is here for evaluation.   Past Medical History:  Diagnosis Date  . Anxiety   . Arthritis   . Blood in stool   . Breast cancer ([place]) 2010   Left breast- chemo/radiation  . Hepatitis C 2007  . Hip fracture ([place])   . History of blood transfusion     Patient Active Problem List   Diagnosis Date Noted  . Displaced spiral fracture of shaft of right femur ([place]) 01/12/2015  . Anxiety 01/12/2015  . Hypokalemia 01/12/2015  . Major depression ([place]) 10/27/2014  . Substance induced mood disorder ([place]) 10/27/2014  . History of breast cancer in female 12/23/2013  . Chronic pain 12/23/2013  . Ankylosing spondylitis of lumbosacral region ([place]) 12/23/2013  . Depression 12/23/2013    Past Surgical History:  Procedure Laterality Date  . BREAST BIOPSY Right 1983   neg  . BREAST EXCISIONAL BIOPSY Left 2010  . BREAST LUMPECTOMY Left 2010  . FEMUR FRACTURE SURGERY    . FOOT SURGERY  2000 and 2003  .  INTRAMEDULLARY (IM) NAIL INTERTROCHANTERIC Right 01/12/2015   Procedure: INTRAMEDULLARY (IM) NAIL INTERTROCHANTRIC;  Surgeon: Earnestine Leys, MD;  Location: [facility] ORS;  Service: Orthopedics;  Laterality: Right;  . KNEE SURGERY Left 2004    Prior to Admission medications   Medication Sig Start Date End Date Taking? Authorizing Provider  ALPRAZolam Duanne Moron) 1 MG tablet Take 1 mg by mouth 4 (four) times daily as needed for anxiety.    Yes Historical Provider, MD  aspirin EC 325 MG tablet Take 1 tablet (325 mg total) by mouth 2 (two) times daily. 01/14/15  Yes Earnestine Leys, MD  busPIRone (BUSPAR) 5 MG tablet Take 5 mg by mouth 3 (three) times daily.   Yes Historical Provider, MD  carisoprodol (SOMA) 350 MG tablet TAKE ONE TABLET BY MOUTH THREE TIMES DAILY AS NEEDED FOR MUSCLE SPASMS 12/24/14  Yes Historical Provider, MD  celecoxib (CELEBREX) 200 MG capsule Take 1 capsule (200 mg total) by mouth daily. 01/14/15  Yes Fritzi Mandes, MD  ferrous sulfate 325 (65 FE) MG tablet Take 1 tablet (325 mg total) by mouth daily with breakfast. 01/14/15  Yes Fritzi Mandes, MD  FLUoxetine (PROZAC) 40 MG capsule Take 40 mg by mouth daily.    Yes Historical Provider, MD  Oxycodone HCl 10 MG TABS Take 1 tablet (10 mg total) by mouth 3 (three) times daily. 04/29/15  Yes Pierce Crane Beers, PA-C  traZODone (DESYREL) 150 MG tablet Take  150 mg by mouth at bedtime.    Yes Historical Provider, MD    Allergies Penicillins  Family History  Problem Relation Age of Onset  . Arthritis Mother   . Stroke Mother   . Heart disease Mother   . Hypertension Mother   . Hypertension Father   . Hypertension Brother   . Heart disease Maternal Aunt   . Breast cancer Neg Hx     Social History Social History  Substance Use Topics  . Smoking status: Current Every Day Smoker    Packs/day: 1.00    Types: Cigarettes  . Smokeless tobacco: Never Used  . Alcohol use Yes    Review of Systems Constitutional: No fever/chills Eyes: No visual  changes. ENT: No sore throat. Cardiovascular: Denies chest pain. Respiratory: Denies shortness of breath. Gastrointestinal: No abdominal pain.  No nausea, no vomiting.  No diarrhea.  No constipation. Genitourinary: Negative for dysuria. Musculoskeletal: Negative for back pain. Skin: Negative for rash. Neurological: Negative for headaches, focal weakness or numbness.  10-point ROS otherwise negative.  ____________________________________________   PHYSICAL EXAM:  VITAL SIGNS: ED Triage Vitals  Enc Vitals Group     BP 08/03/15 0615 98/63     Pulse Rate 08/03/15 0615 65     Resp 08/03/15 0615 18     Temp 08/03/15 0615 97.5 F (36.4 C)     Temp Source 08/03/15 0615 Oral     SpO2 08/03/15 0615 96 %     Weight --      Height --      Head Circumference --      Peak Flow --      Pain Score 08/03/15 0225 10     Pain Loc --      Pain Edu? --      Excl. in [place]? --     Constitutional: Alert and oriented. Well appearing and in no acute distress. Eyes: Conjunctivae are normal. PERRL. EOMI. Head: Atraumatic. Nose: No congestion/rhinnorhea. Mouth/Throat: Mucous membranes are moist.  Oropharynx non-erythematous. Cardiovascular: Normal rate, regular rhythm. Grossly normal heart sounds.  Good peripheral circulation. Respiratory: Normal respiratory effort.  No retractions. Lungs CTAB. Gastrointestinal: Soft and nontender. No distention. Positive bowel sounds Musculoskeletal: No lower extremity tenderness nor edema.   Neurologic:  Normal speech and language.  Skin:  Skin is warm, dry and intact.  Psychiatric: Mood and affect are normal.   ____________________________________________   LABS (all labs ordered are listed, but only abnormal results are displayed)  Labs Reviewed  COMPREHENSIVE METABOLIC PANEL - Abnormal; Notable for the following:       Result Value   Potassium 2.9 (*)    Glucose, Bld 100 (*)    Calcium 8.8 (*)    AST 42 (*)    All other components within normal  limits  ETHANOL - Abnormal; Notable for the following:    Alcohol, Ethyl (B) 147 (*)    All other components within normal limits  ACETAMINOPHEN LEVEL - Abnormal; Notable for the following:    Acetaminophen (Tylenol), Serum <10 (*)    All other components within normal limits  CBC - Abnormal; Notable for the following:    RDW 15.9 (*)    All other components within normal limits  SALICYLATE LEVEL  URINE DRUG SCREEN, QUALITATIVE ([facility] ONLY)   ____________________________________________  EKG  None ____________________________________________  RADIOLOGY  None ____________________________________________   PROCEDURES  Procedure(s) performed: None  Procedures  Critical Care performed: No  ____________________________________________   INITIAL IMPRESSION / ASSESSMENT  AND PLAN / ED COURSE  Pertinent labs & imaging results that were available during my care of the patient were reviewed by me and considered in my medical decision making (see chart for details).  This is a 60 year old female who was brought in by the police after drinking and taking Xanax.  Clinical Course   The patient will be evaluated by psych for her depression. I will give the patient a dose of KCl 40 ME daily 1 dose.  ____________________________________________   FINAL CLINICAL IMPRESSION(S) / ED DIAGNOSES  Final diagnoses:  Depression  Alcohol intoxication, uncomplicated ([place])      NEW MEDICATIONS STARTED DURING THIS VISIT:  New Prescriptions   No medications on file     Note:  This document was prepared using Dragon voice recognition software and may include unintentional dictation errors.    Loney Hering, MD 08/03/15 [phone]

## 2015-08-03 NOTE — ED Notes (Signed)
Pt discharged home after verbalizing understanding of discharge instructions; nad noted. 

## 2015-08-03 NOTE — ED Triage Notes (Signed)
Patient brought in by [place] [employer]. Patient called police department because she was feeling depressed. Patient reports that she has taken xanax and has been drinking etoh.

## 2015-08-03 NOTE — ED Notes (Signed)
UA collected and sent to lab.

## 2015-08-09 DIAGNOSIS — C50912 Malignant neoplasm of unspecified site of left female breast: Secondary | ICD-10-CM | POA: Diagnosis not present

## 2015-08-17 ENCOUNTER — Encounter: Payer: Self-pay | Admitting: Emergency Medicine

## 2015-08-17 DIAGNOSIS — F17210 Nicotine dependence, cigarettes, uncomplicated: Secondary | ICD-10-CM | POA: Insufficient documentation

## 2015-08-17 DIAGNOSIS — R45851 Suicidal ideations: Secondary | ICD-10-CM | POA: Diagnosis not present

## 2015-08-17 DIAGNOSIS — Z853 Personal history of malignant neoplasm of breast: Secondary | ICD-10-CM | POA: Insufficient documentation

## 2015-08-17 DIAGNOSIS — Z79899 Other long term (current) drug therapy: Secondary | ICD-10-CM | POA: Insufficient documentation

## 2015-08-17 DIAGNOSIS — F1910 Other psychoactive substance abuse, uncomplicated: Secondary | ICD-10-CM | POA: Insufficient documentation

## 2015-08-17 DIAGNOSIS — Z7982 Long term (current) use of aspirin: Secondary | ICD-10-CM | POA: Insufficient documentation

## 2015-08-17 DIAGNOSIS — F332 Major depressive disorder, recurrent severe without psychotic features: Secondary | ICD-10-CM | POA: Diagnosis not present

## 2015-08-17 LAB — COMPREHENSIVE METABOLIC PANEL
ALBUMIN: 3.8 g/dL (ref 3.5–5.0)
ALT: 42 U/L (ref 14–54)
ANION GAP: 12 (ref 5–15)
AST: 56 U/L — ABNORMAL HIGH (ref 15–41)
Alkaline Phosphatase: 120 U/L (ref 38–126)
BUN: 9 mg/dL (ref 6–20)
CHLORIDE: 105 mmol/L (ref 101–111)
CO2: 22 mmol/L (ref 22–32)
Calcium: 9.2 mg/dL (ref 8.9–10.3)
Creatinine, Ser: 0.54 mg/dL (ref 0.44–1.00)
GFR calc non Af Amer: >60 mL/min (ref >60)
GLUCOSE: 107 mg/dL — AB (ref 65–99)
Potassium: 3.5 mmol/L (ref 3.5–5.1)
SODIUM: 139 mmol/L (ref 135–145)
Total Bilirubin: 0.5 mg/dL (ref 0.3–1.2)
Total Protein: 8.3 g/dL — ABNORMAL HIGH (ref 6.5–8.1)

## 2015-08-17 LAB — URINE DRUG SCREEN, QUALITATIVE (ARMC ONLY)
AMPHETAMINES, UR SCREEN: NOT DETECTED
Barbiturates, Ur Screen: NOT DETECTED
Benzodiazepine, Ur Scrn: POSITIVE — AB
COCAINE METABOLITE, UR ~~LOC~~: POSITIVE — AB
Cannabinoid 50 Ng, Ur ~~LOC~~: POSITIVE — AB
MDMA (ECSTASY) UR SCREEN: NOT DETECTED
METHADONE SCREEN, URINE: NOT DETECTED
Opiate, Ur Screen: NOT DETECTED
Phencyclidine (PCP) Ur S: NOT DETECTED
TRICYCLIC, UR SCREEN: NOT DETECTED

## 2015-08-17 LAB — CBC
HEMATOCRIT: 43.3 % (ref 35.0–47.0)
HEMOGLOBIN: 14.9 g/dL (ref 12.0–16.0)
MCH: 33.3 pg (ref 26.0–34.0)
MCHC: 34.4 g/dL (ref 32.0–36.0)
MCV: 96.6 fL (ref 80.0–100.0)
Platelets: 257 10*3/uL (ref 150–440)
RBC: 4.48 MIL/uL (ref 3.80–5.20)
RDW: 15.3 % — ABNORMAL HIGH (ref 11.5–14.5)
WBC: 11.4 10*3/uL — ABNORMAL HIGH (ref 3.6–11.0)

## 2015-08-17 LAB — ETHANOL: Alcohol, Ethyl (B): 6 mg/dL — ABNORMAL HIGH (ref <5)

## 2015-08-17 LAB — SALICYLATE LEVEL

## 2015-08-17 LAB — ACETAMINOPHEN LEVEL

## 2015-08-17 NOTE — ED Triage Notes (Signed)
Patient presents to the ED with suicidal ideation with plan to run in front of a car.  Patient tearful in triage.  Patient reports her son told her not to contact her grandchildren anymore.  Patient states, "He thinks I'm crazy, I don't know why."  Patient denies history of suicidal ideation.  Patient states, "I did snort a little bit of cocaine last night, I never done it before, I don't know why they do it, I didn't get anything out of it."  Patient denies using other illegal drugs.  Patient denise hearing voices.  Patient reports only an occasional alcohol drink.

## 2015-08-18 ENCOUNTER — Emergency Department
Admission: EM | Admit: 2015-08-18 | Discharge: 2015-08-18 | Disposition: A | Discharge status: Home / Self Care | Payer: Medicare Other | Attending: Emergency Medicine | Admitting: Emergency Medicine

## 2015-08-18 DIAGNOSIS — G8929 Other chronic pain: Secondary | ICD-10-CM | POA: Diagnosis present

## 2015-08-18 DIAGNOSIS — F332 Major depressive disorder, recurrent severe without psychotic features: Secondary | ICD-10-CM | POA: Diagnosis not present

## 2015-08-18 DIAGNOSIS — R45851 Suicidal ideations: Secondary | ICD-10-CM

## 2015-08-18 DIAGNOSIS — F329 Major depressive disorder, single episode, unspecified: Secondary | ICD-10-CM | POA: Diagnosis present

## 2015-08-18 DIAGNOSIS — F1910 Other psychoactive substance abuse, uncomplicated: Secondary | ICD-10-CM

## 2015-08-18 DIAGNOSIS — F1410 Cocaine abuse, uncomplicated: Secondary | ICD-10-CM

## 2015-08-18 DIAGNOSIS — F1010 Alcohol abuse, uncomplicated: Secondary | ICD-10-CM | POA: Diagnosis present

## 2015-08-18 DIAGNOSIS — F419 Anxiety disorder, unspecified: Secondary | ICD-10-CM | POA: Diagnosis present

## 2015-08-18 MED ORDER — BUSPIRONE HCL 10 MG PO TABS: 10.0000 mg | ORAL_TABLET | Freq: Three times a day (TID) | ORAL | 0 refills | Status: DC

## 2015-08-18 MED ORDER — ACETAMINOPHEN 325 MG PO TABS
650.0000 mg | ORAL_TABLET | Freq: Once | ORAL | Status: AC
  Administered 2015-08-18: 650 mg via ORAL
  Filled 2015-08-18: qty 2

## 2015-08-18 MED ORDER — BUSPIRONE HCL 10 MG PO TABS
10.0000 mg | ORAL_TABLET | Freq: Once | ORAL | Status: AC
  Administered 2015-08-18: 10 mg via ORAL
  Filled 2015-08-18: qty 1

## 2015-08-18 MED ORDER — LORAZEPAM 1 MG PO TABS
1.0000 mg | ORAL_TABLET | Freq: Once | ORAL | Status: AC
  Administered 2015-08-18: 1 mg via ORAL
  Filled 2015-08-18: qty 1

## 2015-08-18 MED ORDER — QUETIAPINE FUMARATE 100 MG PO TABS: 100.0000 mg | ORAL_TABLET | Freq: Every day | ORAL | 0 refills | Status: DC

## 2015-08-18 MED ORDER — TRAZODONE HCL 150 MG PO TABS: 150.0000 mg | ORAL_TABLET | Freq: Every day | ORAL | 0 refills | Status: DC

## 2015-08-18 NOTE — ED Provider Notes (Signed)
[facility] Emergency Department Provider Note   ____________________________________________   First MD Initiated Contact with Patient 08/18/15 0105     (approximate)  I have reviewed the triage vital signs and the nursing notes.   HISTORY  Chief Complaint Suicidal    HPI Janet Zuniga is a 60 y.o. female who comes into the hospital today with a panic attack. She reports that she is crying and she feels as though she wants to kill herself. The patient reports that she needs to stay and talk to someone. She also reports that she needs something for anxiety. She reports she's had anxiety for years but hasn't taken any specific anxiety medicine. She has taken her Prozac. She says that her son will let her of anything to do with her grandkids and she doesn't understand why. She reports that she drinks occasionally but has not been drinking today. She reports though that she did do a line of cocaine last night but she is not sure why. She has no other symptoms and denies chest pain, shortness of breath, nausea, vomiting. She reports that she is also always depressed. The patient has been hospitalized here in the past. She is here for evaluation.   Past Medical History:  Diagnosis Date  . Anxiety   . Arthritis   . Blood in stool   . Breast cancer ([place]) 2010   Left breast- chemo/radiation  . Hepatitis C 2007  . Hip fracture ([place])   . History of blood transfusion     Patient Active Problem List   Diagnosis Date Noted  . Alcohol abuse 08/03/2015  . Displaced spiral fracture of shaft of right femur ([place]) 01/12/2015  . Anxiety 01/12/2015  . Hypokalemia 01/12/2015  . Major depression ([place]) 10/27/2014  . Substance induced mood disorder ([place]) 10/27/2014  . History of breast cancer in female 12/23/2013  . Chronic pain 12/23/2013  . Ankylosing spondylitis of lumbosacral region ([place]) 12/23/2013  . Depression 12/23/2013    Past Surgical History:  Procedure  Laterality Date  . BREAST BIOPSY Right 1983   neg  . BREAST EXCISIONAL BIOPSY Left 2010  . BREAST LUMPECTOMY Left 2010  . FEMUR FRACTURE SURGERY    . FOOT SURGERY  2000 and 2003  . INTRAMEDULLARY (IM) NAIL INTERTROCHANTERIC Right 01/12/2015   Procedure: INTRAMEDULLARY (IM) NAIL INTERTROCHANTRIC;  Surgeon: Earnestine Leys, MD;  Location: [facility] ORS;  Service: Orthopedics;  Laterality: Right;  . KNEE SURGERY Left 2004    Prior to Admission medications   Medication Sig Start Date End Date Taking? Authorizing Provider  ALPRAZolam Duanne Moron) 1 MG tablet Take 1 mg by mouth 4 (four) times daily as needed for anxiety.    Yes Historical Provider, MD  busPIRone (BUSPAR) 5 MG tablet Take 5 mg by mouth 3 (three) times daily.   Yes Historical Provider, MD  carisoprodol (SOMA) 350 MG tablet TAKE ONE TABLET BY MOUTH THREE TIMES DAILY AS NEEDED FOR MUSCLE SPASMS 12/24/14  Yes Historical Provider, MD  FLUoxetine (PROZAC) 40 MG capsule Take 40 mg by mouth daily.    Yes Historical Provider, MD  aspirin EC 325 MG tablet Take 1 tablet (325 mg total) by mouth 2 (two) times daily. 01/14/15   Earnestine Leys, MD  celecoxib (CELEBREX) 200 MG capsule Take 1 capsule (200 mg total) by mouth daily. 01/14/15   Fritzi Mandes, MD  ferrous sulfate 325 (65 FE) MG tablet Take 1 tablet (325 mg total) by mouth daily with breakfast. 01/14/15  Fritzi Mandes, MD  Oxycodone HCl 10 MG TABS Take 1 tablet (10 mg total) by mouth 3 (three) times daily. 04/29/15   Arlyss Repress, PA-C  traZODone (DESYREL) 150 MG tablet Take 150 mg by mouth at bedtime.     Historical Provider, MD    Allergies Penicillins  Family History  Problem Relation Age of Onset  . Arthritis Mother   . Stroke Mother   . Heart disease Mother   . Hypertension Mother   . Hypertension Father   . Hypertension Brother   . Heart disease Maternal Aunt   . Breast cancer Neg Hx     Social History Social History  Substance Use Topics  . Smoking status: Current Every Day Smoker      Packs/day: 1.00    Types: Cigarettes  . Smokeless tobacco: Never Used  . Alcohol use Yes    Review of Systems Constitutional: No fever/chills Eyes: No visual changes. ENT: No sore throat. Cardiovascular: Denies chest pain. Respiratory: Denies shortness of breath. Gastrointestinal: No abdominal pain.  No nausea, no vomiting.  No diarrhea.  No constipation. Genitourinary: Negative for dysuria. Musculoskeletal: Negative for back pain. Skin: Negative for rash. Neurological: Negative for headaches, focal weakness or numbness. Psych: Suicidal ideation and anxiety  10-point ROS otherwise negative.  ____________________________________________   PHYSICAL EXAM:  VITAL SIGNS: ED Triage Vitals  Enc Vitals Group     BP 08/17/15 1835 (!) 145/95     Pulse Rate 08/17/15 1835 97     Resp 08/17/15 1835 16     Temp 08/17/15 1835 98.7 F (37.1 C)     Temp Source 08/17/15 1835 Oral     SpO2 08/17/15 1835 97 %     Weight 08/17/15 1836 135 lb (61.2 kg)     Height 08/17/15 1836 5\' 1"  (1.549 m)     Head Circumference --      Peak Flow --      Pain Score 08/17/15 1836 0     Pain Loc --      Pain Edu? --      Excl. in [place]? --     Constitutional: Alert and oriented. Well appearing and in mild distress. Eyes: Conjunctivae are normal. PERRL. EOMI. Head: Atraumatic. Nose: No congestion/rhinnorhea. Mouth/Throat: Mucous membranes are moist.  Oropharynx non-erythematous. Cardiovascular: Normal rate, regular rhythm. Grossly normal heart sounds.  Good peripheral circulation. Respiratory: Normal respiratory effort.  No retractions. Lungs CTAB. Gastrointestinal: Soft and nontender. No distention. Positive bowel sounds Musculoskeletal: No lower extremity tenderness nor edema.   Neurologic:  Normal speech and language.  Skin:  Skin is warm, dry and intact.  Psychiatric: Mood and affect are normal.   ____________________________________________   LABS (all labs ordered are listed, but only  abnormal results are displayed)  Labs Reviewed  COMPREHENSIVE METABOLIC PANEL - Abnormal; Notable for the following:       Result Value   Glucose, Bld 107 (*)    Total Protein 8.3 (*)    AST 56 (*)    All other components within normal limits  ETHANOL - Abnormal; Notable for the following:    Alcohol, Ethyl (B) 6 (*)    All other components within normal limits  ACETAMINOPHEN LEVEL - Abnormal; Notable for the following:    Acetaminophen (Tylenol), Serum <10 (*)    All other components within normal limits  CBC - Abnormal; Notable for the following:    WBC 11.4 (*)    RDW 15.3 (*)    All  other components within normal limits  URINE DRUG SCREEN, QUALITATIVE ([facility] ONLY) - Abnormal; Notable for the following:    Cocaine Metabolite,Ur [place] POSITIVE (*)    Cannabinoid 50 Ng, Ur [place] POSITIVE (*)    Benzodiazepine, Ur [place] POSITIVE (*)    All other components within normal limits  SALICYLATE LEVEL   ____________________________________________  EKG  None ____________________________________________  RADIOLOGY  None ____________________________________________   PROCEDURES  Procedure(s) performed: None  Procedures  Critical Care performed: No  ____________________________________________   INITIAL IMPRESSION / ASSESSMENT AND PLAN / ED COURSE  Pertinent labs & imaging results that were available during my care of the patient were reviewed by me and considered in my medical decision making (see chart for details).  This is a 60 year old female who comes into the hospital today with some suicidal ideation and anxiety. I will give the patient a dose of Ativan. The patient does have some drugs in her urine. I'll have the patient evaluated by TTS and then by psych.  Clinical Course   The patient has no complaints at this time.  ____________________________________________   FINAL CLINICAL IMPRESSION(S) / ED DIAGNOSES  Final diagnoses:  Suicidal ideation    Polysubstance abuse      NEW MEDICATIONS STARTED DURING THIS VISIT:  New Prescriptions   No medications on file     Note:  This document was prepared using Dragon voice recognition software and may include unintentional dictation errors.    Loney Hering, MD 08/18/15 [phone]

## 2015-08-18 NOTE — ED Notes (Signed)
Patient discharged home per MD order.  Patient received all personal belongings such as clothing and black pocketbook.  Reviewed patient's discharge instructions and prescriptions and patient indicated understanding.  Patient denies SI/HI/AVH.  Patient left ambulatory with a friend.

## 2015-08-18 NOTE — Discharge Instructions (Signed)
Follow-up with your regular care givers and follow the rest of the instructions as given by Dr. Weber Cooks

## 2015-08-18 NOTE — ED Notes (Signed)
Patient is awake and eating breakfast.  She presents with flat, blunted affect.  She denies any thoughts of self harm.  She continues to report ongoing depressive symptoms such as sadness and tearfulness.  She denies any HI/AVH.  Her goal today is to "speak with doctor or social worker."  Patient reports increased anxiety and worrying.  Her behavior is appropriate to situation.

## 2015-08-18 NOTE — Consult Note (Signed)
[place] [facility] Consult   Reason for Consult:  Consult for 60 year old woman with a history of depression and chronic pain and substance abuse Referring Physician:  Edd Fabian Patient Identification: Janet Zuniga MRN:  [id number] Principal Diagnosis: Major depression [facility]) Diagnosis:   Patient Active Problem List   Diagnosis Date Noted  . Cocaine abuse [F14.10] 08/18/2015  . Alcohol abuse [F10.10] 08/03/2015  . Displaced spiral fracture of shaft of right femur ([place]) [S72.341A] 01/12/2015  . Anxiety [F41.9] 01/12/2015  . Hypokalemia [E87.6] 01/12/2015  . Major depression ([place]) [F32.9] 10/27/2014  . Substance induced mood disorder ([place]) [F19.94] 10/27/2014  . History of breast cancer in female [Z85.3] 12/23/2013  . Chronic pain [G89.29] 12/23/2013  . Ankylosing spondylitis of lumbosacral region ([place]) [M45.7] 12/23/2013  . Depression [F32.9] 12/23/2013    Total Time spent with patient: 1 hour  Subjective:   Janet Zuniga is a 59 y.o. female patient admitted with "I stay so depressed".  HPI:  Patient interviewed. Chart reviewed. Labs reviewed. Patient known from previous encounters. 60 year old woman with a history of chronic depression and substance abuse came to the emergency room because her sadness has been worse. She is feeling sad and nervous most of the time. Crying a lot of the time. She is having a lot of difficulty sleeping at night. Feels anxious all the time. One precipitating problem is that her son sent her some text messages yesterday which she found to be hurtful and that upset her. Another probably more important issue is that she has run out of her prescription medicine. Her doctor is taking her off of Xanax. Patient still had some leftover but has now run out of that as of a few days ago. She has also run out of her trazodone and BuSpar. She is still taking her Prozac but thinks that she needs to be back on Seroquel as well. Patient is actually not endorsing any  thoughts about wanting to try and hurt or kill her self now. Denies having any current hallucinations. Denies any homicidal ideation. She admits that she used some cocaine about 3 days ago but denies any other recent substance abus  Social history: Patient is currently living alone. She has adult children. She particularly has a lot of drama with her son. He apparently will not let her see her grandchildren and this is a constant source of distress for her. She is not working outside the home.  Medical history: Chronic back pain. This is also been a major chronic problem for. She is seeing pain specialist. Not clear if she is still getting narcotics right now.  Substance abuse history: Patient has a history of abusing multiple substances mostly alcohol and some of her controlled substance prescriptions at time although she says that she did use cocaine a few days ago. She says that the only time she is ever done that.   Past Psychiatric History: Long history of depressive problems. She is not really following up very closely although it sounds that she does have a new psychiatrist recently. She is not seeing a therapist right now. She has run out of her medicine and also wants to be back on Seroquel. She has a history of suicidal threats in the past. She does have a past history of hospitalization. Multiple ER presentations  Risk to Self: Suicidal Ideation: Yes-Currently Present Is patient at risk for suicide?: Yes Suicidal Plan?: Yes-Currently Present Specify Current Suicidal Plan: Pt reportsa plan to setp out in traffic Access to  Means: Yes Specify Access to Suicidal Means: access to traffic What has been your use of drugs/alcohol within the last 12 months?: Alocohol and cocaine How many times?: 0 Other Self Harm Risks: None reported Triggers for Past Attempts: None known Intentional Self Injurious Behavior: None Risk to Others: Homicidal Ideation: No Thoughts of Harm to Others: No Current  Homicidal Intent: No Current Homicidal Plan: No Access to Homicidal Means: No Identified Victim: None identified History of harm to others?: No Assessment of Violence: None Noted Violent Behavior Description: None identified Does patient have access to weapons?: No Criminal Charges Pending?: No Does patient have a court date: No Prior Inpatient Therapy: Prior Inpatient Therapy: Yes Prior Therapy Dates: 2010 Prior Therapy Facilty/Provider(s): [facility] Reason for Treatment: Suicidal Prior Outpatient Therapy: Prior Outpatient Therapy: Yes Prior Therapy Dates: 2010 Prior Therapy Facilty/Provider(s): [facility] Reason for Treatment: Depression Does patient have an ACCT team?: No Does patient have Intensive In-House Services?  : No Does patient have [facility] services? : No Does patient have P4CC services?: No  Past Medical History:  Past Medical History:  Diagnosis Date  . Anxiety   . Arthritis   . Blood in stool   . Breast cancer ([place]) 2010   Left breast- chemo/radiation  . Hepatitis C 2007  . Hip fracture ([place])   . History of blood transfusion     Past Surgical History:  Procedure Laterality Date  . BREAST BIOPSY Right 1983   neg  . BREAST EXCISIONAL BIOPSY Left 2010  . BREAST LUMPECTOMY Left 2010  . FEMUR FRACTURE SURGERY    . FOOT SURGERY  2000 and 2003  . INTRAMEDULLARY (IM) NAIL INTERTROCHANTERIC Right 01/12/2015   Procedure: INTRAMEDULLARY (IM) NAIL INTERTROCHANTRIC;  Surgeon: Earnestine Leys, MD;  Location: [facility] ORS;  Service: Orthopedics;  Laterality: Right;  . KNEE SURGERY Left 2004   Family History:  Family History  Problem Relation Age of Onset  . Arthritis Mother   . Stroke Mother   . Heart disease Mother   . Hypertension Mother   . Hypertension Father   . Hypertension Brother   . Heart disease Maternal Aunt   . Breast cancer Neg Hx    Family Psychiatric  History: Patient reports a family history of depression Social History:  History  Alcohol Use  . Yes      History  Drug Use  . Types: Marijuana    Social History   Social History  . Marital status: Legally Separated    Spouse name: N/A  . Number of children: N/A  . Years of education: N/A   Social History Main Topics  . Smoking status: Current Every Day Smoker    Packs/day: 1.00    Types: Cigarettes  . Smokeless tobacco: Never Used  . Alcohol use Yes  . Drug use:     Types: Marijuana  . Sexual activity: Not Asked   Other Topics Concern  . None   Social History Narrative  . None   Additional Social History:    Allergies:   Allergies  Allergen Reactions  . Penicillins Rash and Other (See Comments)    Has patient had a PCN reaction causing immediate rash, facial/tongue/throat swelling, SOB or lightheadedness with hypotension: Yes Has patient had a PCN reaction causing severe rash involving mucus membranes or skin necrosis: No Has patient had a PCN reaction that required hospitalization No Has patient had a PCN reaction occurring within the last 10 years: No If all of the above answers are "NO", then may  proceed with Cephalosporin use.     Labs:  Results for orders placed or performed during the hospital encounter of 08/18/15 (from the past 48 hour(s))  Comprehensive metabolic panel     Status: Abnormal   Collection Time: 08/17/15  6:41 PM  Result Value Ref Range   Sodium 139 135 - 145 mmol/L   Potassium 3.5 3.5 - 5.1 mmol/L   Chloride 105 101 - 111 mmol/L   CO2 22 22 - 32 mmol/L   Glucose, Bld 107 (H) 65 - 99 mg/dL   BUN 9 6 - 20 mg/dL   Creatinine, Ser 0.54 0.44 - 1.00 mg/dL   Calcium 9.2 8.9 - 10.3 mg/dL   Total Protein 8.3 (H) 6.5 - 8.1 g/dL   Albumin 3.8 3.5 - 5.0 g/dL   AST 56 (H) 15 - 41 U/L   ALT 42 14 - 54 U/L   Alkaline Phosphatase 120 38 - 126 U/L   Total Bilirubin 0.5 0.3 - 1.2 mg/dL   GFR calc non Af Amer >60 >60 mL/min   GFR calc Af Amer >60 >60 mL/min    Comment: (NOTE) The eGFR has been calculated using the CKD EPI equation. This calculation  has not been validated in all clinical situations. eGFR's persistently <60 mL/min signify possible Chronic Kidney Disease.    Anion gap 12 5 - 15  Ethanol     Status: Abnormal   Collection Time: 08/17/15  6:41 PM  Result Value Ref Range   Alcohol, Ethyl (B) 6 (H) <5 mg/dL    Comment:        LOWEST DETECTABLE LIMIT FOR SERUM ALCOHOL IS 5 mg/dL FOR MEDICAL PURPOSES ONLY   Salicylate level     Status: None   Collection Time: 08/17/15  6:41 PM  Result Value Ref Range   Salicylate Lvl <4.8 2.8 - 30.0 mg/dL  Acetaminophen level     Status: Abnormal   Collection Time: 08/17/15  6:41 PM  Result Value Ref Range   Acetaminophen (Tylenol), Serum <10 (L) 10 - 30 ug/mL    Comment:        THERAPEUTIC CONCENTRATIONS VARY SIGNIFICANTLY. A RANGE OF 10-30 ug/mL MAY BE AN EFFECTIVE CONCENTRATION FOR MANY PATIENTS. HOWEVER, SOME ARE BEST TREATED AT CONCENTRATIONS OUTSIDE THIS RANGE. ACETAMINOPHEN CONCENTRATIONS >150 ug/mL AT 4 HOURS AFTER INGESTION AND >50 ug/mL AT 12 HOURS AFTER INGESTION ARE OFTEN ASSOCIATED WITH TOXIC REACTIONS.   cbc     Status: Abnormal   Collection Time: 08/17/15  6:41 PM  Result Value Ref Range   WBC 11.4 (H) 3.6 - 11.0 K/uL   RBC 4.48 3.80 - 5.20 MIL/uL   Hemoglobin 14.9 12.0 - 16.0 g/dL   HCT 43.3 35.0 - 47.0 %   MCV 96.6 80.0 - 100.0 fL   MCH 33.3 26.0 - 34.0 pg   MCHC 34.4 32.0 - 36.0 g/dL   RDW 15.3 (H) 11.5 - 14.5 %   Platelets 257 150 - 440 K/uL  Urine Drug Screen, Qualitative     Status: Abnormal   Collection Time: 08/17/15  6:41 PM  Result Value Ref Range   Tricyclic, Ur Screen NONE DETECTED NONE DETECTED   Amphetamines, Ur Screen NONE DETECTED NONE DETECTED   MDMA (Ecstasy)Ur Screen NONE DETECTED NONE DETECTED   Cocaine Metabolite,Ur [place] POSITIVE (A) NONE DETECTED   Opiate, Ur Screen NONE DETECTED NONE DETECTED   Phencyclidine (PCP) Ur S NONE DETECTED NONE DETECTED   Cannabinoid 50 Ng, Ur [place] POSITIVE (A) NONE DETECTED  Barbiturates, Ur Screen  NONE DETECTED NONE DETECTED   Benzodiazepine, Ur Scrn POSITIVE (A) NONE DETECTED   Methadone Scn, Ur NONE DETECTED NONE DETECTED    Comment: (NOTE) 696  Tricyclics, urine               Cutoff 1000 ng/mL 200  Amphetamines, urine             Cutoff 1000 ng/mL 300  MDMA (Ecstasy), urine           Cutoff 500 ng/mL 400  Cocaine Metabolite, urine       Cutoff 300 ng/mL 500  Opiate, urine                   Cutoff 300 ng/mL 600  Phencyclidine (PCP), urine      Cutoff 25 ng/mL 700  Cannabinoid, urine              Cutoff 50 ng/mL 800  Barbiturates, urine             Cutoff 200 ng/mL 900  Benzodiazepine, urine           Cutoff 200 ng/mL 1000 Methadone, urine                Cutoff 300 ng/mL 1100 1200 The urine drug screen provides only a preliminary, unconfirmed 1300 analytical test result and should not be used for non-medical 1400 purposes. Clinical consideration and professional judgment should 1500 be applied to any positive drug screen result due to possible 1600 interfering substances. A more specific alternate chemical method 1700 must be used in order to obtain a confirmed analytical result.  1800 Gas chromato graphy / mass spectrometry (GC/MS) is the preferred 1900 confirmatory method.     No current facility-administered medications for this encounter.    Current Outpatient Prescriptions  Medication Sig Dispense Refill  . ALPRAZolam (XANAX) 1 MG tablet Take 1 mg by mouth 4 (four) times daily as needed for anxiety.     . busPIRone (BUSPAR) 5 MG tablet Take 5 mg by mouth 3 (three) times daily.    . carisoprodol (SOMA) 350 MG tablet TAKE ONE TABLET BY MOUTH THREE TIMES DAILY AS NEEDED FOR MUSCLE SPASMS  1  . FLUoxetine (PROZAC) 40 MG capsule Take 40 mg by mouth daily.     [place] Kitchen aspirin EC 325 MG tablet Take 1 tablet (325 mg total) by mouth 2 (two) times daily. 30 tablet 0  . celecoxib (CELEBREX) 200 MG capsule Take 1 capsule (200 mg total) by mouth daily. 30 capsule 0  . ferrous sulfate  325 (65 FE) MG tablet Take 1 tablet (325 mg total) by mouth daily with breakfast. 30 tablet 3  . Oxycodone HCl 10 MG TABS Take 1 tablet (10 mg total) by mouth 3 (three) times daily. 20 tablet 0  . traZODone (DESYREL) 150 MG tablet Take 150 mg by mouth at bedtime.       Musculoskeletal: Strength & Muscle Tone: within normal limits Gait & Station: normal Patient leans: N/A  Psychiatric Specialty Exam: Physical Exam  Nursing note and vitals reviewed. Constitutional: She appears well-developed and well-nourished.  HENT:  Head: Normocephalic and atraumatic.  Eyes: Conjunctivae are normal. Pupils are equal, round, and reactive to light.  Neck: Normal range of motion.  Cardiovascular: Normal heart sounds.   Respiratory: Effort normal.  GI: Soft.  Musculoskeletal: Normal range of motion.  Neurological: She is alert.  Skin: Skin is warm and dry.  Psychiatric: Her speech is normal  and behavior is normal. Judgment normal. Cognition and memory are normal. She exhibits a depressed mood. She expresses no suicidal ideation.    Review of Systems  Constitutional: Negative.   HENT: Negative.   Eyes: Negative.   Respiratory: Negative.   Cardiovascular: Negative.   Gastrointestinal: Negative.   Musculoskeletal: Positive for back pain.  Skin: Negative.   Neurological: Negative.   Psychiatric/Behavioral: Positive for depression and substance abuse. Negative for hallucinations, memory loss and suicidal ideas. The patient is nervous/anxious and has insomnia.     Blood pressure 136/86, pulse 86, temperature 98.2 F (36.8 C), temperature source Oral, resp. rate 18, height '5\' 1"'  (1.549 m), weight 61.2 kg (135 lb), SpO2 98 %.Body mass index is 25.51 kg/m.  General Appearance: Disheveled  Eye Contact:  Minimal  Speech:  Clear and Coherent  Volume:  Increased  Mood:  Anxious and Depressed  Affect:  Labile  Thought Process:  Coherent  Orientation:  Full (Time, Place, and Person)  Thought Content:   Logical  Suicidal Thoughts:  No  Homicidal Thoughts:  No  Memory:  Immediate;   Good Recent;   Fair Remote;   Fair  Judgement:  Fair  Insight:  Fair  Psychomotor Activity:  Decreased  Concentration:  Concentration: Fair  Recall:  [employer] of Knowledge:  Fair  Language:  Fair  Akathisia:  No  Handed:  Right  AIMS (if indicated):     Assets:  Communication Skills Desire for Improvement Housing  ADL's:  Intact  Cognition:  WNL  Sleep:        Treatment Plan Summary: Medication management and Plan 61 year old woman with a history of depression and anxiety and substance abuse. Although she was tearful and upset she tells me very clearly that she is not having any thoughts of killing herself. Does not actually want to die. She says that she can continue to stay clean off of alcohol and drugs. Mostly she wanted refills of her medicine. I advised her that I could not restart her Xanax or any controlled substances but I would be able to give her prescription for her trazodone her buspirone and if she found Seroquel helpful in the past refill her Seroquel at 100 mg at night. Patient strongly advised that she needs to continue outpatient follow-up with her private psychiatrist and to stay off of alcohol and drugs. Case reviewed with the ER physician. Patient can be released from the emergency room.  Disposition: Patient does not meet criteria for psychiatric inpatient admission.  Alethia Berthold, MD 08/18/2015 2:47 PM

## 2015-08-18 NOTE — BH Assessment (Signed)
Assessment Note  Janet Zuniga is an 60 y.o. female presenting to the ED voluntarily with complaints of increasing anxiety and thoughts of hurting herself by stepping in front of traffic.  Pt is tearful during assessment states that she wants to be prescribed something for her anxiety.  She reports being tearful and upset because her son will not let her communicate with her grandson.  Pt denies drinking tonight.  She did state that she did some drugs.  Pt's UDS positive for benzos, cocaine and marijuana.  Diagnosis: Anxiety  Past Medical History:  Past Medical History:  Diagnosis Date  . Anxiety   . Arthritis   . Blood in stool   . Breast cancer ([place]) 2010   Left breast- chemo/radiation  . Hepatitis C 2007  . Hip fracture ([place])   . History of blood transfusion     Past Surgical History:  Procedure Laterality Date  . BREAST BIOPSY Right 1983   neg  . BREAST EXCISIONAL BIOPSY Left 2010  . BREAST LUMPECTOMY Left 2010  . FEMUR FRACTURE SURGERY    . FOOT SURGERY  2000 and 2003  . INTRAMEDULLARY (IM) NAIL INTERTROCHANTERIC Right 01/12/2015   Procedure: INTRAMEDULLARY (IM) NAIL INTERTROCHANTRIC;  Surgeon: Earnestine Leys, MD;  Location: [facility] ORS;  Service: Orthopedics;  Laterality: Right;  . KNEE SURGERY Left 2004    Family History:  Family History  Problem Relation Age of Onset  . Arthritis Mother   . Stroke Mother   . Heart disease Mother   . Hypertension Mother   . Hypertension Father   . Hypertension Brother   . Heart disease Maternal Aunt   . Breast cancer Neg Hx     Social History:  reports that she has been smoking Cigarettes.  She has been smoking about 1.00 pack per day. She has never used smokeless tobacco. She reports that she drinks alcohol. She reports that she uses drugs, including Marijuana.  Additional Social History:  Alcohol / Drug Use History of alcohol / drug use?: Yes Substance #1 Name of Substance 1: alcohol 1 - Age of First Use: 32 1 - Amount  (size/oz): varies 1 - Frequency: varies 1 - Duration: varies 1 - Last Use / Amount: 08/17/15  CIWA: CIWA-Ar BP: 117/81 Pulse Rate: 66 COWS:    Allergies:  Allergies  Allergen Reactions  . Penicillins Rash and Other (See Comments)    Has patient had a PCN reaction causing immediate rash, facial/tongue/throat swelling, SOB or lightheadedness with hypotension: Yes Has patient had a PCN reaction causing severe rash involving mucus membranes or skin necrosis: No Has patient had a PCN reaction that required hospitalization No Has patient had a PCN reaction occurring within the last 10 years: No If all of the above answers are "NO", then may proceed with Cephalosporin use.     Home Medications:  (Not in a hospital admission)  OB/GYN Status:  No LMP recorded. Patient is postmenopausal.  General Assessment Data Location of Assessment: [facility] ED TTS Assessment: In system Is this a Tele or Face-to-Face Assessment?: Face-to-Face Is this an Initial Assessment or a Re-assessment for this encounter?: Initial Assessment Marital status: Separated Maiden name: Fabio Neighbors Is patient pregnant?: No Pregnancy Status: No Living Arrangements: Alone Can pt return to current living arrangement?: Yes Admission Status: Voluntary Is patient capable of signing voluntary admission?: Yes Referral Source: Self/Family/Friend Insurance type: [place] Screening Exam (Hydro) Medical Exam completed: Yes  Crisis Care Plan Living Arrangements: Alone Legal  Guardian: Other: (Self) Name of Psychiatrist: None Name of Therapist: None  Education Status Highest grade of school patient has completed: 12th Name of school: [employer] Catching person: n/a  Risk to self with the past 6 months Suicidal Ideation: Yes-Currently Present Has patient been a risk to self within the past 6 months prior to admission? : No Has patient had any suicidal intent within the past 6 months prior to  admission? : No Is patient at risk for suicide?: Yes Suicidal Plan?: Yes-Currently Present Has patient had any suicidal plan within the past 6 months prior to admission? : No Specify Current Suicidal Plan: Pt reportsa plan to setp out in traffic Access to Means: Yes Specify Access to Suicidal Means: access to traffic What has been your use of drugs/alcohol within the last 12 months?: Alocohol and cocaine Previous Attempts/Gestures: No How many times?: 0 Other Self Harm Risks: None reported Triggers for Past Attempts: None known Intentional Self Injurious Behavior: None Family Suicide History: No Recent stressful life event(s): Conflict (Comment), Other (Comment) (conflict with son) Persecutory voices/beliefs?: No Depression: Yes Depression Symptoms: Loss of interest in usual pleasures, Tearfulness Substance abuse history and/or treatment for substance abuse?: Yes Suicide prevention information given to non-admitted patients: Not applicable  Risk to Others within the past 6 months Homicidal Ideation: No Does patient have any lifetime risk of violence toward others beyond the six months prior to admission? : No Thoughts of Harm to Others: No Current Homicidal Intent: No Current Homicidal Plan: No Access to Homicidal Means: No Identified Victim: None identified History of harm to others?: No Assessment of Violence: None Noted Violent Behavior Description: None identified Does patient have access to weapons?: No Criminal Charges Pending?: No Does patient have a court date: No Is patient on probation?: No  Psychosis Hallucinations: None noted Delusions: None noted  Mental Status Report Appearance/Hygiene: In scrubs Eye Contact: Poor Motor Activity: Freedom of movement Speech: Soft, Slurred Level of Consciousness: Crying Mood: Sad, Worthless, low self-esteem Affect: Depressed, Sad Anxiety Level: Minimal Thought Processes: Coherent, Relevant Judgement:  Partial Orientation: Person, Place, Situation Obsessive Compulsive Thoughts/Behaviors: None  Cognitive Functioning Concentration: Normal Memory: Recent Intact, Remote Intact IQ: Average Insight: Fair Impulse Control: Fair Appetite: Fair Weight Loss: 0 Weight Gain: 0 Sleep: Decreased Vegetative Symptoms: None  ADLScreening [facility] Assessment Services) Patient's cognitive ability adequate to safely complete daily activities?: Yes Patient able to express need for assistance with ADLs?: Yes Independently performs ADLs?: Yes (appropriate for developmental age)  Prior Inpatient Therapy Prior Inpatient Therapy: Yes Prior Therapy Dates: 2010 Prior Therapy Facilty/Provider(s): [facility] Reason for Treatment: Suicidal  Prior Outpatient Therapy Prior Outpatient Therapy: Yes Prior Therapy Dates: 2010 Prior Therapy Facilty/Provider(s): [facility] Reason for Treatment: Depression Does patient have an ACCT team?: No Does patient have Intensive In-House Services?  : No Does patient have [facility] services? : No Does patient have P4CC services?: No  ADL Screening (condition at time of admission) Patient's cognitive ability adequate to safely complete daily activities?: Yes Patient able to express need for assistance with ADLs?: Yes Independently performs ADLs?: Yes (appropriate for developmental age)       Abuse/Neglect Assessment (Assessment to be complete while patient is alone) Physical Abuse: Denies Verbal Abuse: Denies Sexual Abuse: Denies Exploitation of patient/patient's resources: Denies Self-Neglect: Denies Values / Beliefs Cultural Requests During Hospitalization: None Spiritual Requests During Hospitalization: None Consults Spiritual Care Consult Needed: No Social Work Consult Needed: No Regulatory affairs officer (For Healthcare) Does patient have an advance directive?: No Would patient  like information on creating an advanced directive?: No - patient declined information     Additional Information 1:1 In Past 12 Months?: No CIRT Risk: No Elopement Risk: No Does patient have medical clearance?: Yes     Disposition:  Disposition Initial Assessment Completed for this Encounter: Yes Disposition of Patient: Other dispositions Other disposition(s): Other (Comment) (Pending Psych MD consult)  On Site Evaluation by:   Reviewed with Physician:    Epifanio Labrador C Tashianna Broome 08/18/2015 1:48 AM

## 2015-09-10 DIAGNOSIS — Z0184 Encounter for antibody response examination: Secondary | ICD-10-CM | POA: Diagnosis not present

## 2015-09-10 DIAGNOSIS — Z23 Encounter for immunization: Secondary | ICD-10-CM | POA: Diagnosis not present

## 2015-09-12 DIAGNOSIS — Z853 Personal history of malignant neoplasm of breast: Secondary | ICD-10-CM | POA: Insufficient documentation

## 2015-09-12 DIAGNOSIS — C50912 Malignant neoplasm of unspecified site of left female breast: Secondary | ICD-10-CM | POA: Insufficient documentation

## 2015-09-12 DIAGNOSIS — F322 Major depressive disorder, single episode, severe without psychotic features: Secondary | ICD-10-CM | POA: Insufficient documentation

## 2015-10-12 LAB — HM HEPATITIS C SCREENING LAB: HM HEPATITIS C SCREENING: POSITIVE

## 2015-10-19 ENCOUNTER — Ambulatory Visit: Payer: Self-pay | Admitting: Gastroenterology

## 2015-11-08 ENCOUNTER — Ambulatory Visit (INDEPENDENT_AMBULATORY_CARE_PROVIDER_SITE_OTHER): Payer: Self-pay | Admitting: Gastroenterology

## 2015-11-08 ENCOUNTER — Other Ambulatory Visit: Payer: Self-pay

## 2015-11-08 ENCOUNTER — Encounter: Payer: Self-pay | Admitting: Gastroenterology

## 2015-11-08 VITALS — BP 138/95 | HR 98 | Temp 98.4°F | Ht 61.0 in | Wt 142.0 lb

## 2015-11-08 DIAGNOSIS — B182 Chronic viral hepatitis C: Secondary | ICD-10-CM

## 2015-11-08 NOTE — Progress Notes (Signed)
Gastroenterology Consultation  Referring Provider:     No ref. provider found Primary Care Physician:  No PCP Per Patient Primary Gastroenterologist:  Dr. Allen Norris     Reason for Consultation:     Hepatitis C        HPI:   Janet Zuniga is a 60 y.o. y/o female referred for consultation & management of Hepatitis C by Dr. Rayne Du PCP Per Patient.  This patient comes in today after being told she had hepatitis C for many years. The patient states she was told over 10 years ago that she has hepatitis C but has never been treated. The patient states that she was told 10 years ago that there was new drugs coming out and she never followed through with this. The patient denies any IV drug use, cocaine use, blood transfusions before 1990 or high risk sexual activity. The patient does report that she did use cocaine once which was in August because she had a positive drug screen but states that she had never done it before or since. The patient also denies alcohol abuse but does state occasionally. There is no report of any fatigue or unexplained weight loss. She also denies any black stools or bloody stools. The patient has had her viral load checked but not her genotype.  Past Medical History:  Diagnosis Date  . Anxiety   . Arthritis   . Blood in stool   . Breast cancer ([place]) 2010   Left breast- chemo/radiation  . Hepatitis C 2007  . Hip fracture ([place])   . History of blood transfusion     Past Surgical History:  Procedure Laterality Date  . BREAST BIOPSY Right 1983   neg  . BREAST EXCISIONAL BIOPSY Left 2010  . BREAST LUMPECTOMY Left 2010  . FEMUR FRACTURE SURGERY    . FOOT SURGERY  2000 and 2003  . INTRAMEDULLARY (IM) NAIL INTERTROCHANTERIC Right 01/12/2015   Procedure: INTRAMEDULLARY (IM) NAIL INTERTROCHANTRIC;  Surgeon: Earnestine Leys, MD;  Location: [facility] ORS;  Service: Orthopedics;  Laterality: Right;  . KNEE SURGERY Left 2004    Prior to Admission medications   Medication Sig Start Date  End Date Taking? Authorizing Provider  busPIRone (BUSPAR) 10 MG tablet Take 1 tablet (10 mg total) by mouth 3 (three) times daily. 08/18/15  Yes Gonzella Lex, MD  cyclobenzaprine (FLEXERIL) 10 MG tablet  10/12/15  Yes Historical Provider, MD  FLUoxetine (PROZAC) 40 MG capsule Take 40 mg by mouth daily.    Yes Historical Provider, MD  ibuprofen (ADVIL,MOTRIN) 800 MG tablet Take by mouth. 10/12/15  Yes Historical Provider, MD  traZODone (DESYREL) 150 MG tablet Take 1 tablet (150 mg total) by mouth at bedtime. 08/18/15  Yes Gonzella Lex, MD    Family History  Problem Relation Age of Onset  . Arthritis Mother   . Stroke Mother   . Heart disease Mother   . Hypertension Mother   . Hypertension Father   . Hypertension Brother   . Heart disease Maternal Aunt   . Breast cancer Neg Hx      Social History  Substance Use Topics  . Smoking status: Current Every Day Smoker    Packs/day: 1.00    Types: Cigarettes  . Smokeless tobacco: Never Used  . Alcohol use Yes    Allergies as of 11/08/2015 - Review Complete 11/08/2015  Allergen Reaction Noted  . Tramadol Nausea Only 10/12/2015  . Penicillins Rash and Other (See Comments) 12/23/2013  Review of Systems:    All systems reviewed and negative except where noted in HPI.   Physical Exam:  BP (!) 138/95   Pulse 98   Temp 98.4 F (36.9 C) (Oral)   Ht 5\' 1"  (1.549 m)   Wt 142 lb (64.4 kg)   BMI 26.83 kg/m  No LMP recorded. Patient is postmenopausal. Psych:  Alert and cooperative. Normal mood and affect. General:   Alert,  Well-developed, well-nourished, pleasant and cooperative in NAD Head:  Normocephalic and atraumatic. Eyes:  Sclera clear, no icterus.   Conjunctiva pink. Ears:  Normal auditory acuity. Nose:  No deformity, discharge, or lesions. Mouth:  No deformity or lesions,oropharynx pink & moist. Neck:  Supple; no masses or thyromegaly. Lungs:  Respirations even and unlabored.  Clear throughout to auscultation.   No  wheezes, crackles, or rhonchi. No acute distress. Heart:  Regular rate and rhythm; no murmurs, clicks, rubs, or gallops. Abdomen:  Normal bowel sounds.  No bruits.  Soft, non-tender and non-distended without masses, hepatosplenomegaly or hernias noted.  No guarding or rebound tenderness.  Negative Carnett sign.   Rectal:  Deferred.  Msk:  Symmetrical without gross deformities.  Good, equal movement & strength bilaterally. Pulses:  Normal pulses noted. Extremities:  No clubbing or edema.  No cyanosis. Neurologic:  Alert and oriented x3;  grossly normal neurologically. Skin:  Intact without significant lesions or rashes.  No jaundice. Lymph Nodes:  No significant cervical adenopathy. Psych:  Alert and cooperative. Normal mood and affect.  Imaging Studies: No results found.  Assessment and Plan:   Janet Zuniga is a 60 y.o. y/o female who comes in today with a history of hepatitis C. The patient has never been treated. The patient will have a process scan and genotype sent off. The patient will also have other labs checked to see the medication and duration of treatment that will be necessary. The patient has been told that once these are back she will be started on treatment for hepatitis C. The patient been explained the plan and agrees with it.    Janet Lame, MD. Janet Zuniga   Note: This dictation was prepared with Dragon dictation along with smaller phrase technology. Any transcriptional errors that result from this process are unintentional.

## 2015-11-08 NOTE — Patient Instructions (Addendum)
You are scheduled for a RUQ abdominal US at [facility] on Friday, Nov 10th @ 8:00am. Please arrive at the [facility] at 7:45am. You cannot have anything to eat or drink after midnight Thursday night.   You have been given an order for labs today. Please go to [facility] lab or to [place] to have these done.   If you have any questions or concerns, please contact our office.

## 2015-11-12 ENCOUNTER — Ambulatory Visit: Payer: Medicare Other

## 2015-11-12 LAB — CBC WITH DIFFERENTIAL/PLATELET
BASOS: 0 %
Basophils Absolute: 0 10*3/uL (ref 0.0–0.2)
EOS (ABSOLUTE): 0.1 10*3/uL (ref 0.0–0.4)
EOS: 1 %
HEMATOCRIT: 39.4 % (ref 34.0–46.6)
Hemoglobin: 12.9 g/dL (ref 11.1–15.9)
IMMATURE GRANS (ABS): 0 10*3/uL (ref 0.0–0.1)
IMMATURE GRANULOCYTES: 0 %
LYMPHS: 22 %
Lymphocytes Absolute: 2.2 10*3/uL (ref 0.7–3.1)
MCH: 30.5 pg (ref 26.6–33.0)
MCHC: 32.7 g/dL (ref 31.5–35.7)
MCV: 93 fL (ref 79–97)
MONOCYTES: 6 %
Monocytes Absolute: 0.6 10*3/uL (ref 0.1–0.9)
NEUTROS PCT: 71 %
Neutrophils Absolute: 7 10*3/uL (ref 1.4–7.0)
Platelets: 394 10*3/uL — ABNORMAL HIGH (ref 150–379)
RBC: 4.23 x10E6/uL (ref 3.77–5.28)
RDW: 13.4 % (ref 12.3–15.4)
WBC: 9.9 10*3/uL (ref 3.4–10.8)

## 2015-11-12 LAB — HEPATIC FUNCTION PANEL
ALT: 34 IU/L — AB (ref 0–32)
AST: 29 IU/L (ref 0–40)
Albumin: 3.8 g/dL (ref 3.5–5.5)
Alkaline Phosphatase: 157 IU/L — ABNORMAL HIGH (ref 39–117)
BILIRUBIN TOTAL: 0.3 mg/dL (ref 0.0–1.2)
BILIRUBIN, DIRECT: 0.11 mg/dL (ref 0.00–0.40)
Total Protein: 7.6 g/dL (ref 6.0–8.5)

## 2015-11-12 LAB — HEPATITIS C GENOTYPE

## 2015-11-12 LAB — ANTI-SMOOTH MUSCLE ANTIBODY, IGG: Smooth Muscle Ab: 8 Units (ref 0–19)

## 2015-11-12 LAB — PROTIME-INR
INR: 1.1 (ref 0.8–1.2)
Prothrombin Time: 11.2 s (ref 9.1–12.0)

## 2015-11-12 LAB — CERULOPLASMIN: CERULOPLASMIN: 40 mg/dL — AB (ref 19.0–39.0)

## 2015-11-12 LAB — ANA: Anti Nuclear Antibody(ANA): NEGATIVE

## 2015-11-12 LAB — MITOCHONDRIAL ANTIBODIES: Mitochondrial Ab: 5 Units (ref 0.0–20.0)

## 2015-11-15 ENCOUNTER — Ambulatory Visit
Admission: RE | Admit: 2015-11-15 | Discharge: 2015-11-15 | Disposition: A | Discharge status: Home / Self Care | Payer: Medicare Other | Source: Ambulatory Visit | Attending: Gastroenterology | Admitting: Gastroenterology

## 2015-11-15 ENCOUNTER — Ambulatory Visit: Admission: RE | Admit: 2015-11-15 | Payer: Medicare Other | Source: Ambulatory Visit

## 2015-11-15 DIAGNOSIS — B182 Chronic viral hepatitis C: Secondary | ICD-10-CM | POA: Insufficient documentation

## 2015-11-16 ENCOUNTER — Telehealth: Payer: Self-pay

## 2015-11-16 ENCOUNTER — Encounter (INDEPENDENT_AMBULATORY_CARE_PROVIDER_SITE_OTHER): Payer: Medicare Other | Admitting: Vascular Surgery

## 2015-11-16 NOTE — Telephone Encounter (Signed)
-----   Message from Lucilla Lame, MD sent at 11/15/2015  1:09 PM EST ----- This patient needs to be started on treatment of all of her labs are in.

## 2015-11-16 NOTE — Telephone Encounter (Signed)
-----   Message from Lucilla Lame, MD sent at 11/15/2015  1:06 PM EST ----- Let the patient know that her fibrosis score was f0/f1.

## 2015-11-16 NOTE — Telephone Encounter (Signed)
Pt notified of labs and US/tissue elastography results. Paperwork completed and faxed to [facility]. Waiting on approval.

## 2015-11-23 ENCOUNTER — Encounter (INDEPENDENT_AMBULATORY_CARE_PROVIDER_SITE_OTHER): Payer: Self-pay | Admitting: Vascular Surgery

## 2015-12-14 ENCOUNTER — Telehealth: Payer: Self-pay | Admitting: Gastroenterology

## 2015-12-14 NOTE — Telephone Encounter (Signed)
When it's time to get Harvoni refilled does she call [facility] or do you?

## 2015-12-15 ENCOUNTER — Telehealth: Payer: Self-pay | Admitting: Gastroenterology

## 2015-12-15 NOTE — Telephone Encounter (Signed)
She has 2 different delivery days for her Harvoni and wants to know when the RX was sent in. Please call

## 2015-12-16 NOTE — Telephone Encounter (Signed)
Pt was confused as to how she would receive her refill on Harvoni. [facility] contacted pt and has set up a delivery for Tuesday. Advised pt once she completes her last bottle of medication to contact our office to schedule an appt.

## 2016-01-10 ENCOUNTER — Other Ambulatory Visit: Payer: Self-pay

## 2016-01-10 ENCOUNTER — Ambulatory Visit: Payer: Self-pay | Admitting: Oncology

## 2016-02-01 ENCOUNTER — Ambulatory Visit: Payer: Medicare Other | Admitting: Gastroenterology

## 2016-02-04 ENCOUNTER — Other Ambulatory Visit: Payer: Self-pay | Admitting: *Deleted

## 2016-02-04 DIAGNOSIS — C50912 Malignant neoplasm of unspecified site of left female breast: Secondary | ICD-10-CM

## 2016-02-06 NOTE — Progress Notes (Deleted)
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  INTERVAL HISTORY: Patient last evaluated in November 2015. She continues to complain of left breast pain as well as back pain both of which are chronic and unchanged. She continues to have high anxiety and multiple social issues.  She denies any chest pain, shortness of breath, or hemoptysis. She denies any abdominal pain. She denies any nausea, vomiting, constipation, or diarrhea. She has no urinary complaints. She denies any easy bleeding or bruising.  Patient offers no further specific complaints.   REVIEW OF SYSTEMS:   Review of Systems  Constitutional: Positive for malaise/fatigue. Negative for fever.  Cardiovascular: Negative.  Negative for chest pain.  Gastrointestinal: Negative.   Musculoskeletal: Positive for back pain.  Neurological: Positive for weakness.  Psychiatric/Behavioral: Negative.     As per HPI. Otherwise, a complete review of systems is negatve.  PAST MEDICAL HISTORY: Past Medical History:  Diagnosis Date  . Anxiety   . Arthritis   . Blood in stool   . Breast cancer ([place]) 2010   Left breast- chemo/radiation  . Hepatitis C 2007  . Hip fracture ([place])   . History of blood transfusion     PAST SURGICAL HISTORY: Past Surgical History:  Procedure Laterality Date  . BREAST BIOPSY Right 1983   neg  . BREAST EXCISIONAL BIOPSY Left 2010  . BREAST LUMPECTOMY Left 2010  . FEMUR FRACTURE SURGERY    . FOOT SURGERY  2000 and 2003  . INTRAMEDULLARY (IM) NAIL INTERTROCHANTERIC Right 01/12/2015   Procedure: INTRAMEDULLARY (IM) NAIL INTERTROCHANTRIC;  Surgeon: Earnestine Leys, MD;  Location: [facility] ORS;  Service: Orthopedics;  Laterality: Right;  . KNEE  SURGERY Left 2004    FAMILY HISTORY Family History  Problem Relation Age of Onset  . Arthritis Mother   . Stroke Mother   . Heart disease Mother   . Hypertension Mother   . Hypertension Father   . Hypertension Brother   . Heart disease Maternal Aunt   . Breast cancer Neg Hx        ADVANCED DIRECTIVES:    HEALTH MAINTENANCE: Social History  Substance Use Topics  . Smoking status: Current Every Day Smoker    Packs/day: 1.00    Types: Cigarettes  . Smokeless tobacco: Never Used  . Alcohol use Yes     Colonoscopy:  PAP:  Bone density:  Lipid panel:  Allergies  Allergen Reactions  . Tramadol Nausea Only  . Penicillins Rash and Other (See Comments)    Has patient had a PCN reaction causing immediate rash, facial/tongue/throat swelling, SOB or lightheadedness with hypotension: Yes Has patient had a PCN reaction causing severe rash involving mucus membranes or skin necrosis: No Has patient had a PCN reaction that required hospitalization No Has patient had a PCN reaction occurring within the last 10 years: No If all of the above answers are "NO", then may proceed with Cephalosporin use.     Current Outpatient Prescriptions  Medication Sig Dispense Refill  . busPIRone (BUSPAR) 10 MG tablet Take 1 tablet (10 mg total) by mouth 3 (three) times daily. 90 tablet 0  . cyclobenzaprine (FLEXERIL) 10 MG tablet     . FLUoxetine (PROZAC) 40 MG capsule Take 40 mg by mouth daily.     Marland Kitchen  ibuprofen (ADVIL,MOTRIN) 800 MG tablet Take by mouth.    . traZODone (DESYREL) 150 MG tablet Take 1 tablet (150 mg total) by mouth at bedtime. 30 tablet 0   No current facility-administered medications for this visit.     OBJECTIVE: There were no vitals filed for this visit.   There is no height or weight on file to calculate BMI.    ECOG FS:0 - Asymptomatic  General: Well-developed, well-nourished, no acute distress. Eyes: Pink conjunctiva, anicteric sclera Breasts: bilateral breast and  axilla without lumps or masses.  Lungs: Clear to auscultation bilaterally. Heart: Regular rate and rhythm. No rubs, murmurs, or gallops. Abdomen: Soft, nontender, nondistended. No organomegaly noted, normoactive bowel sounds. Musculoskeletal: No edema, cyanosis, or clubbing. Neuro: Alert, answering all questions appropriately. Cranial nerves grossly intact. Skin: No rashes or petechiae noted. Psych: Normal affect.  LAB RESULTS:  Lab Results  Component Value Date   NA 139 08/17/2015   K 3.5 08/17/2015   CL 105 08/17/2015   CO2 22 08/17/2015   GLUCOSE 107 (H) 08/17/2015   BUN 9 08/17/2015   CREATININE 0.54 08/17/2015   CALCIUM 9.2 08/17/2015   PROT 7.6 11/08/2015   ALBUMIN 3.8 11/08/2015   AST 29 11/08/2015   ALT 34 (H) 11/08/2015   ALKPHOS 157 (H) 11/08/2015   BILITOT 0.3 11/08/2015   GFRNONAA >60 08/17/2015   GFRAA >60 08/17/2015    Lab Results  Component Value Date   WBC 9.9 11/08/2015   NEUTROABS 7.0 11/08/2015   HGB 14.9 08/17/2015   HCT 39.4 11/08/2015   MCV 93 11/08/2015   PLT 394 (H) 11/08/2015     STUDIES: No results found.  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  PLAN:    1.  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 Patient's most recent mammogram on April 03, 2014  was reported as BI-RADS 2. Repeat in one year. Patient completed all of her chemotherapy in December 2010 and her adjuvant XRT in the Spring of 2011. Return to clinic in 1 year routine evaluation.  2.  Anxiety: Patient will no longer receive Xanax from this clinic. 3.  Ankylosing spondylitis: Treatment for her primary rheumatologist.  Patient should not receive narcotics from this clinic.  4.  Elevated LFTs: Secondary to hepatitis C. 5.  Previous suicide attempts: No refills on Xanax or narcotics as above.  Patient expressed understanding and was in agreement with this plan. She  also understands that She can call clinic at any time with any questions, concerns, or complaints.     Lloyd Huger, MD   02/06/2016 11:12 PM

## 2016-02-07 ENCOUNTER — Inpatient Hospital Stay: Payer: Medicare Other | Admitting: Oncology

## 2016-02-07 ENCOUNTER — Inpatient Hospital Stay: Payer: Medicare Other

## 2016-02-07 ENCOUNTER — Other Ambulatory Visit: Payer: Self-pay

## 2016-02-09 ENCOUNTER — Ambulatory Visit: Payer: Medicare Other | Admitting: Gastroenterology

## 2016-02-21 ENCOUNTER — Ambulatory Visit: Payer: Medicare Other | Admitting: Gastroenterology

## 2016-02-22 ENCOUNTER — Inpatient Hospital Stay: Payer: Medicare Other

## 2016-02-22 ENCOUNTER — Inpatient Hospital Stay: Payer: Medicare Other | Admitting: Oncology

## 2016-03-07 ENCOUNTER — Other Ambulatory Visit: Payer: Self-pay | Admitting: Oncology

## 2016-03-07 ENCOUNTER — Inpatient Hospital Stay: Payer: Medicare Other

## 2016-03-07 ENCOUNTER — Inpatient Hospital Stay: Payer: Medicare Other | Attending: Oncology | Admitting: Oncology

## 2016-03-07 VITALS — BP 132/83 | HR 52 | Temp 97.9°F | Resp 18 | Wt 153.4 lb

## 2016-03-07 DIAGNOSIS — M459 Ankylosing spondylitis of unspecified sites in spine: Secondary | ICD-10-CM | POA: Insufficient documentation

## 2016-03-07 DIAGNOSIS — Z171 Estrogen receptor negative status [ER-]: Secondary | ICD-10-CM

## 2016-03-07 DIAGNOSIS — Z853 Personal history of malignant neoplasm of breast: Secondary | ICD-10-CM | POA: Diagnosis not present

## 2016-03-07 DIAGNOSIS — Z8781 Personal history of (healed) traumatic fracture: Secondary | ICD-10-CM | POA: Diagnosis not present

## 2016-03-07 DIAGNOSIS — Z79899 Other long term (current) drug therapy: Secondary | ICD-10-CM

## 2016-03-07 DIAGNOSIS — C50912 Malignant neoplasm of unspecified site of left female breast: Secondary | ICD-10-CM

## 2016-03-07 DIAGNOSIS — M2550 Pain in unspecified joint: Secondary | ICD-10-CM

## 2016-03-07 DIAGNOSIS — F329 Major depressive disorder, single episode, unspecified: Secondary | ICD-10-CM | POA: Diagnosis not present

## 2016-03-07 DIAGNOSIS — F419 Anxiety disorder, unspecified: Secondary | ICD-10-CM | POA: Insufficient documentation

## 2016-03-07 DIAGNOSIS — F17210 Nicotine dependence, cigarettes, uncomplicated: Secondary | ICD-10-CM | POA: Diagnosis not present

## 2016-03-07 DIAGNOSIS — C50919 Malignant neoplasm of unspecified site of unspecified female breast: Secondary | ICD-10-CM

## 2016-03-07 DIAGNOSIS — Z8619 Personal history of other infectious and parasitic diseases: Secondary | ICD-10-CM | POA: Diagnosis not present

## 2016-03-07 DIAGNOSIS — Z915 Personal history of self-harm: Secondary | ICD-10-CM | POA: Insufficient documentation

## 2016-03-07 DIAGNOSIS — M549 Dorsalgia, unspecified: Secondary | ICD-10-CM | POA: Insufficient documentation

## 2016-03-07 DIAGNOSIS — Z170 Estrogen receptor positive status [ER+]: Principal | ICD-10-CM

## 2016-03-07 NOTE — Progress Notes (Signed)
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  INTERVAL HISTORY: Patient returns to clinic for routine yearly evaluation. She currently feels well and is asymptomatic. She continues to have chronic back and joint pain. She continues to have high anxiety and multiple social issues.  She denies any chest pain, shortness of breath, or hemoptysis. She denies any abdominal pain. She denies any nausea, vomiting, constipation, or diarrhea. She has no urinary complaints. She denies any easy bleeding or bruising.  Patient offers no further specific complaints.   REVIEW OF SYSTEMS:   Review of Systems  Constitutional: Negative for fever, malaise/fatigue and weight loss.  Respiratory: Negative.  Negative for cough and shortness of breath.   Cardiovascular: Negative.  Negative for chest pain and leg swelling.  Gastrointestinal: Negative.  Negative for abdominal pain.  Genitourinary: Negative.   Musculoskeletal: Positive for back pain and joint pain.  Neurological: Negative for weakness.  Psychiatric/Behavioral: Positive for depression. The patient is nervous/anxious.     As per HPI. Otherwise, a complete review of systems is negative.  PAST MEDICAL HISTORY: Past Medical History:  Diagnosis Date  . Anxiety   . Arthritis   . Blood in stool   . Breast cancer ([place]) 2010   Left breast- chemo/radiation  . Hepatitis C 2007  . Hip fracture ([place])   . History of blood transfusion     PAST SURGICAL HISTORY: Past Surgical History:  Procedure Laterality Date  . BREAST BIOPSY Right 1983   neg  . BREAST EXCISIONAL BIOPSY Left 2010  . BREAST LUMPECTOMY Left 2010  . FEMUR FRACTURE SURGERY    . FOOT SURGERY  2000 and 2003  .  INTRAMEDULLARY (IM) NAIL INTERTROCHANTERIC Right 01/12/2015   Procedure: INTRAMEDULLARY (IM) NAIL INTERTROCHANTRIC;  Surgeon: Earnestine Leys, MD;  Location: [facility] ORS;  Service: Orthopedics;  Laterality: Right;  . KNEE SURGERY Left 2004    FAMILY HISTORY Family History  Problem Relation Age of Onset  . Arthritis Mother   . Stroke Mother   . Heart disease Mother   . Hypertension Mother   . Hypertension Father   . Hypertension Brother   . Heart disease Maternal Aunt   . Breast cancer Neg Hx        ADVANCED DIRECTIVES:    HEALTH MAINTENANCE: Social History  Substance Use Topics  . Smoking status: Current Every Day Smoker    Packs/day: 1.00    Types: Cigarettes  . Smokeless tobacco: Never Used  . Alcohol use Yes     Colonoscopy:  PAP:  Bone density:  Lipid panel:  Allergies  Allergen Reactions  . Tramadol Nausea Only  . Penicillins Rash and Other (See Comments)    Has patient had a PCN reaction causing immediate rash, facial/tongue/throat swelling, SOB or lightheadedness with hypotension: Yes Has patient had a PCN reaction causing severe rash involving mucus membranes or skin necrosis: No Has patient had a PCN reaction that required hospitalization No Has patient had a PCN reaction occurring within the last 10 years: No If all of the above answers are "NO", then may proceed with Cephalosporin use.     Current Outpatient Prescriptions  Medication Sig Dispense Refill  . busPIRone (BUSPAR) 10 MG tablet Take 1 tablet (10 mg total) by mouth 3 (three)  times daily. 90 tablet 0  . cyclobenzaprine (FLEXERIL) 10 MG tablet     . FLUoxetine (PROZAC) 40 MG capsule Take 40 mg by mouth daily.     Marland Kitchen ibuprofen (ADVIL,MOTRIN) 800 MG tablet Take by mouth.    . traZODone (DESYREL) 150 MG tablet Take 1 tablet (150 mg total) by mouth at bedtime. 30 tablet 0   No current facility-administered medications for this visit.     OBJECTIVE: Vitals:   03/07/16 1119  BP: 132/83  Pulse: (!)  52  Resp: 18  Temp: 97.9 F (36.6 C)     Body mass index is 28.99 kg/m.    ECOG FS:0 - Asymptomatic  General: Well-developed, well-nourished, no acute distress. Eyes: Pink conjunctiva, anicteric sclera Breasts: Bilateral breast and axilla without lumps or masses.  Lungs: Clear to auscultation bilaterally. Heart: Regular rate and rhythm. No rubs, murmurs, or gallops. Abdomen: Soft, nontender, nondistended. No organomegaly noted, normoactive bowel sounds. Musculoskeletal: No edema, cyanosis, or clubbing. Neuro: Alert, answering all questions appropriately. Cranial nerves grossly intact. Skin: No rashes or petechiae noted. Psych: Normal affect.  LAB RESULTS:  Lab Results  Component Value Date   NA 139 08/17/2015   K 3.5 08/17/2015   CL 105 08/17/2015   CO2 22 08/17/2015   GLUCOSE 107 (H) 08/17/2015   BUN 9 08/17/2015   CREATININE 0.54 08/17/2015   CALCIUM 9.2 08/17/2015   PROT 7.6 11/08/2015   ALBUMIN 3.8 11/08/2015   AST 29 11/08/2015   ALT 34 (H) 11/08/2015   ALKPHOS 157 (H) 11/08/2015   BILITOT 0.3 11/08/2015   GFRNONAA >60 08/17/2015   GFRAA >60 08/17/2015    Lab Results  Component Value Date   WBC 9.9 11/08/2015   NEUTROABS 7.0 11/08/2015   HGB 14.9 08/17/2015   HCT 39.4 11/08/2015   MCV 93 11/08/2015   PLT 394 (H) 11/08/2015   Lab Results  Component Value Date   LABCA2 31.3 03/07/2016     STUDIES: No results found.  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  PLAN:    1.  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 Patient's most recent mammogram on May 19, 2015 was reported as BI-RADS 2. Repeat in May 2018. Patient completed all of her chemotherapy in December 2010 and her adjuvant XRT in the Spring of 2011. Return to clinic in 1 year routine evaluation.  2.  Anxiety: Patient will no longer receive Xanax from this clinic. 3.  Ankylosing  spondylitis: Treatment for her primary rheumatologist.  Patient should not receive narcotics from this clinic.  4.  Hepatitis C: Patient reports this has resolved with treatment. 5.  Previous suicide attempts: No refills on Xanax or narcotics as above.  Patient expressed understanding and was in agreement with this plan. She also understands that She can call clinic at any time with any questions, concerns, or complaints.    Lloyd Huger, MD   03/07/2016 12:11 PM

## 2016-03-07 NOTE — Progress Notes (Signed)
Patient here today for one year follow up appointment.

## 2016-03-08 LAB — CANCER ANTIGEN 27.29: CA 27.29: 31.3 U/mL (ref 0.0–38.6)

## 2016-03-13 ENCOUNTER — Other Ambulatory Visit: Payer: Self-pay

## 2016-03-13 ENCOUNTER — Ambulatory Visit: Payer: Medicare Other | Admitting: Gastroenterology

## 2016-03-13 DIAGNOSIS — B182 Chronic viral hepatitis C: Secondary | ICD-10-CM

## 2016-04-13 ENCOUNTER — Ambulatory Visit: Payer: Medicare Other | Admitting: Gastroenterology

## 2016-05-11 ENCOUNTER — Ambulatory Visit: Payer: Medicare Other | Admitting: Gastroenterology

## 2016-05-16 ENCOUNTER — Telehealth: Payer: Self-pay | Admitting: Gastroenterology

## 2016-05-16 NOTE — Telephone Encounter (Signed)
Patient called to state she didn't take all of her Harvoni she missed two doses. What does she need to do?

## 2016-05-16 NOTE — Telephone Encounter (Signed)
Tell the patient is nothing he can do about it now which is decreases the chances that she will not respond. She should be tested for her hepatitis C viral load a month after finishing treatment and and again at 6 months and a year.

## 2016-05-23 ENCOUNTER — Ambulatory Visit: Payer: Medicare Other

## 2016-05-23 ENCOUNTER — Other Ambulatory Visit: Payer: Self-pay

## 2016-05-25 ENCOUNTER — Other Ambulatory Visit: Payer: Self-pay

## 2016-05-25 DIAGNOSIS — B182 Chronic viral hepatitis C: Secondary | ICD-10-CM

## 2016-05-25 NOTE — Telephone Encounter (Signed)
Spoke with pt regarding her Hep C treatment. Pt completed this in January. Pt stated she only missed two pills. She completed the entire treatment. I have ordered labs to recheck viral load and liver enzymes. Pt has a follow up appt with Dr. Allen Norris on 06/13/16. I advised her to keep this appt.

## 2016-06-02 ENCOUNTER — Other Ambulatory Visit
Admission: RE | Admit: 2016-06-02 | Discharge: 2016-06-02 | Disposition: A | Discharge status: Home / Self Care | Payer: Medicare Other | Source: Ambulatory Visit | Attending: Gastroenterology | Admitting: Gastroenterology

## 2016-06-02 DIAGNOSIS — B182 Chronic viral hepatitis C: Secondary | ICD-10-CM | POA: Diagnosis present

## 2016-06-02 LAB — HEPATIC FUNCTION PANEL
ALK PHOS: 111 U/L (ref 38–126)
ALT: 16 U/L (ref 14–54)
AST: 21 U/L (ref 15–41)
Albumin: 3.6 g/dL (ref 3.5–5.0)
Total Bilirubin: 0.4 mg/dL (ref 0.3–1.2)
Total Protein: 7.5 g/dL (ref 6.5–8.1)

## 2016-06-06 ENCOUNTER — Telehealth: Payer: Self-pay | Admitting: Gastroenterology

## 2016-06-06 ENCOUNTER — Ambulatory Visit: Payer: Medicare Other

## 2016-06-06 NOTE — Telephone Encounter (Signed)
Results from blood work. Please call

## 2016-06-07 ENCOUNTER — Telehealth: Payer: Self-pay

## 2016-06-07 NOTE — Telephone Encounter (Signed)
Pt notified of lab results. Still waiting on viral load.

## 2016-06-07 NOTE — Telephone Encounter (Signed)
-----   Message from Lucilla Lame, MD sent at 06/06/2016 12:31 PM EDT ----- Let the patient know that her liver enzymes are now back to normal.

## 2016-06-08 NOTE — Telephone Encounter (Signed)
Pt notified of lab results

## 2016-06-12 ENCOUNTER — Emergency Department: Payer: Medicare Other

## 2016-06-12 ENCOUNTER — Encounter: Payer: Self-pay | Admitting: Emergency Medicine

## 2016-06-12 ENCOUNTER — Emergency Department
Admission: EM | Admit: 2016-06-12 | Discharge: 2016-06-12 | Disposition: A | Discharge status: Home / Self Care | Payer: Medicare Other | Attending: Emergency Medicine | Admitting: Emergency Medicine

## 2016-06-12 DIAGNOSIS — Z79899 Other long term (current) drug therapy: Secondary | ICD-10-CM | POA: Diagnosis not present

## 2016-06-12 DIAGNOSIS — Z853 Personal history of malignant neoplasm of breast: Secondary | ICD-10-CM | POA: Insufficient documentation

## 2016-06-12 DIAGNOSIS — K2970 Gastritis, unspecified, without bleeding: Secondary | ICD-10-CM

## 2016-06-12 DIAGNOSIS — F17210 Nicotine dependence, cigarettes, uncomplicated: Secondary | ICD-10-CM | POA: Insufficient documentation

## 2016-06-12 DIAGNOSIS — R1013 Epigastric pain: Secondary | ICD-10-CM

## 2016-06-12 LAB — COMPREHENSIVE METABOLIC PANEL
ALK PHOS: 96 U/L (ref 38–126)
ALT: 14 U/L (ref 14–54)
AST: 26 U/L (ref 15–41)
Albumin: 3.7 g/dL (ref 3.5–5.0)
Anion gap: 9 (ref 5–15)
BILIRUBIN TOTAL: 0.4 mg/dL (ref 0.3–1.2)
BUN: 15 mg/dL (ref 6–20)
CALCIUM: 9.1 mg/dL (ref 8.9–10.3)
CO2: 27 mmol/L (ref 22–32)
CREATININE: 0.59 mg/dL (ref 0.44–1.00)
Chloride: 102 mmol/L (ref 101–111)
Glucose, Bld: 112 mg/dL — ABNORMAL HIGH (ref 65–99)
Potassium: 3.2 mmol/L — ABNORMAL LOW (ref 3.5–5.1)
Sodium: 138 mmol/L (ref 135–145)
TOTAL PROTEIN: 7.8 g/dL (ref 6.5–8.1)

## 2016-06-12 LAB — TROPONIN I

## 2016-06-12 LAB — CBC
HCT: 41.1 % (ref 35.0–47.0)
Hemoglobin: 13.8 g/dL (ref 12.0–16.0)
MCH: 29.5 pg (ref 26.0–34.0)
MCHC: 33.4 g/dL (ref 32.0–36.0)
MCV: 88.3 fL (ref 80.0–100.0)
Platelets: 282 10*3/uL (ref 150–440)
RBC: 4.66 MIL/uL (ref 3.80–5.20)
RDW: 16.4 % — ABNORMAL HIGH (ref 11.5–14.5)
WBC: 10.6 10*3/uL (ref 3.6–11.0)

## 2016-06-12 LAB — LIPASE, BLOOD: Lipase: 26 U/L (ref 11–51)

## 2016-06-12 MED ORDER — FAMOTIDINE 40 MG PO TABS: 40.0000 mg | ORAL_TABLET | Freq: Every evening | ORAL | 1 refills | Status: DC

## 2016-06-12 MED ORDER — SUCRALFATE 1 G PO TABS: 1.0000 g | ORAL_TABLET | Freq: Four times a day (QID) | ORAL | 0 refills | Status: DC

## 2016-06-12 MED ORDER — GI COCKTAIL ~~LOC~~
30.0000 mL | Freq: Once | ORAL | Status: AC
  Administered 2016-06-12: 30 mL via ORAL
  Filled 2016-06-12: qty 30

## 2016-06-12 NOTE — ED Notes (Signed)
Pt discharged to home.  Family member driving.  Discharge instructions reviewed.  Verbalized understanding.  No questions or concerns at this time.  Teach back verified.  Pt in NAD.  No items left in ED.   

## 2016-06-12 NOTE — ED Triage Notes (Signed)
Epigastric pain since 9pm last night.

## 2016-06-12 NOTE — ED Provider Notes (Signed)
[facility] Emergency Department Provider Note  ____________________________________________   I have reviewed the triage vital signs and the nursing notes.   HISTORY  Chief Complaint Abdominal Pain   History limited by: Not Limited   HPI Janet Zuniga is a 61 y.o. female who presents to the emergency department today because of concerns for abdominal pain. Is located in the epigastric region. It started last night. Has been constant since then. She describes it as being severe. It does not radiate. She has not had any vomiting with it. She has not noticed any bloody stool or black or tarry stool.   Past Medical History:  Diagnosis Date  . Anxiety   . Arthritis   . Blood in stool   . Breast cancer ([place]) 2010   Left breast- chemo/radiation  . Hepatitis C 2007  . Hip fracture ([place])   . History of blood transfusion     Patient Active Problem List   Diagnosis Date Noted  . Malignant neoplasm of left female breast ([place]) 09/12/2015  . Severe major depression without psychotic features ([place]) 09/12/2015  . Cocaine abuse 08/18/2015  . Alcohol abuse 08/03/2015  . Chronic use of opiate drugs therapeutic purposes 05/19/2015  . Leg reflex sympathetic dystrophy, right 05/19/2015  . Displaced spiral fracture of shaft of right femur ([place]) 01/12/2015  . Anxiety 01/12/2015  . Hypokalemia 01/12/2015  . Anxiety and depression 12/24/2014  . Chronic low back pain 12/24/2014  . Major depression 10/27/2014  . Substance induced mood disorder ([place]) 10/27/2014  . Chronic pain in right foot 10/20/2014  . Insomnia 06/05/2014  . Chronic pain syndrome 05/15/2014  . Raynaud's phenomenon without gangrene 05/15/2014  . Spinal stenosis of lumbar region 05/15/2014  . 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 Generalized anxiety disorder 05/04/2014  . Hepatitis C 03/12/2014  . Midline low back pain without sciatica 03/12/2014  . History of breast cancer in female 12/23/2013  .  Chronic pain 12/23/2013  . Ankylosing spondylitis of lumbosacral region ([place]) 12/23/2013  . Depression 12/23/2013  . Osteoarthritis 10/05/2011    Past Surgical History:  Procedure Laterality Date  . BREAST BIOPSY Right 1983   neg  . BREAST EXCISIONAL BIOPSY Left 2010  . BREAST LUMPECTOMY Left 2010  . FEMUR FRACTURE SURGERY    . FOOT SURGERY  2000 and 2003  . INTRAMEDULLARY (IM) NAIL INTERTROCHANTERIC Right 01/12/2015   Procedure: INTRAMEDULLARY (IM) NAIL INTERTROCHANTRIC;  Surgeon: Earnestine Leys, MD;  Location: [facility] ORS;  Service: Orthopedics;  Laterality: Right;  . KNEE SURGERY Left 2004    Prior to Admission medications   Medication Sig Start Date End Date Taking? Authorizing Provider  busPIRone (BUSPAR) 10 MG tablet Take 1 tablet (10 mg total) by mouth 3 (three) times daily. 08/18/15   Clapacs, Madie Reno, MD  cyclobenzaprine (FLEXERIL) 10 MG tablet  10/12/15   [provider]  FLUoxetine (PROZAC) 40 MG capsule Take 40 mg by mouth daily.     [provider]  ibuprofen (ADVIL,MOTRIN) 800 MG tablet Take by mouth. 10/12/15   [provider]  traZODone (DESYREL) 150 MG tablet Take 1 tablet (150 mg total) by mouth at bedtime. 08/18/15   Clapacs, Madie Reno, MD    Allergies Tramadol and Penicillins  Family History  Problem Relation Age of Onset  . Arthritis Mother   . Stroke Mother   . Heart disease Mother   . Hypertension Mother   . Hypertension Father   . Hypertension Brother   . Heart  disease Maternal Aunt   . Breast cancer Neg Hx     Social History Social History  Substance Use Topics  . Smoking status: Current Every Day Smoker    Packs/day: 1.00    Types: Cigarettes  . Smokeless tobacco: Never Used  . Alcohol use Yes    Review of Systems Constitutional: No fever/chills Eyes: No visual changes. ENT: No sore throat. Cardiovascular: Denies chest pain. Respiratory: Denies shortness of breath. Gastrointestinal: Positive for abdominal pain.   Genitourinary: Negative for dysuria. Musculoskeletal: Negative for back pain. Skin: Negative for rash. Neurological: Negative for headaches, focal weakness or numbness.  ____________________________________________   PHYSICAL EXAM:  VITAL SIGNS: ED Triage Vitals  Enc Vitals Group     BP 06/12/16 1547 137/85     Pulse Rate 06/12/16 1547 84     Resp 06/12/16 1547 18     Temp 06/12/16 1547 97.9 F (36.6 C)     Temp Source 06/12/16 1547 Oral     SpO2 06/12/16 1547 96 %     Weight 06/12/16 1548 154 lb (69.9 kg)     Height 06/12/16 1548 5\' 1"  (1.549 m)     Head Circumference --      Peak Flow --      Pain Score 06/12/16 1547 9   Constitutional: Alert and oriented. Well appearing and in no distress. Eyes: Conjunctivae are normal.  ENT   Head: Normocephalic and atraumatic.   Nose: No congestion/rhinnorhea.   Mouth/Throat: Mucous membranes are moist.   Neck: No stridor. Hematological/Lymphatic/Immunilogical: No cervical lymphadenopathy. Cardiovascular: Normal rate, regular rhythm.  No murmurs, rubs, or gallops.  Respiratory: Normal respiratory effort without tachypnea nor retractions. Breath sounds are clear and equal bilaterally. No wheezes/rales/rhonchi. Gastrointestinal: Soft and tender to palpation in the epigastric region. No rebound. No guarding.  Genitourinary: Deferred Musculoskeletal: Normal range of motion in all extremities. No lower extremity edema. Neurologic:  Normal speech and language. No gross focal neurologic deficits are appreciated.  Skin:  Skin is warm, dry and intact. No rash noted. Psychiatric: Mood and affect are normal. Speech and behavior are normal. Patient exhibits appropriate insight and judgment.  ____________________________________________    LABS (pertinent positives/negatives)  Labs Reviewed  COMPREHENSIVE METABOLIC PANEL - Abnormal; Notable for the following:       Result Value   Potassium 3.2 (*)    Glucose, Bld 112 (*)     All other components within normal limits  CBC - Abnormal; Notable for the following:    RDW 16.4 (*)    All other components within normal limits  LIPASE, BLOOD  TROPONIN I     ____________________________________________   EKG  I, Nance Pear, attending physician, personally viewed and interpreted this EKG  EKG Time: 1544 Rate: 82 Rhythm: normal sinus rhythm Axis: normal Intervals: qtc 486 QRS: narrow ST changes: no st elevation, t wave inversion V1, V2 Impression: abnormal ekg   ____________________________________________    RADIOLOGY  RUQ US IMPRESSION:  1. Contracted gallbladder without shadowing stones or sludge. No  biliary dilatation.  2. Increased hepatic echogenicity consistent with fatty infiltration    ____________________________________________   PROCEDURES  Procedures  ____________________________________________   INITIAL IMPRESSION / ASSESSMENT AND PLAN / ED COURSE  Pertinent labs & imaging results that were available during my care of the patient were reviewed by me and considered in my medical decision making (see chart for details).  Patient presented to the emergency department today because of concerns for epigastric pain. Ultrasound did not show  any gallbladder stones. Patient did feel better after GI cocktail. At this point I think gastritis likely. Will discharge with sucralfate and Pepcid.  ____________________________________________   FINAL CLINICAL IMPRESSION(S) / ED DIAGNOSES  Final diagnoses:  Epigastric pain  Gastritis, presence of bleeding unspecified, unspecified chronicity, unspecified gastritis type     Note: This dictation was prepared with Dragon dictation. Any transcriptional errors that result from this process are unintentional     Nance Pear, MD 06/12/16 2008

## 2016-06-12 NOTE — ED Notes (Signed)
Pt asking for pain meds. RN informed that MD must evaluate pt first. Pt states " well then the nurse out front lied because she said I would get some as soon as I got a room" Rn informed pt that MD must see pt and order pain medication if neccessary

## 2016-06-12 NOTE — Discharge Instructions (Signed)
Please seek medical attention for any high fevers, chest pain, shortness of breath, change in behavior, persistent vomiting, bloody stool or any other new or concerning symptoms.  

## 2016-06-13 ENCOUNTER — Ambulatory Visit: Payer: Medicare Other | Admitting: Gastroenterology

## 2016-06-16 ENCOUNTER — Ambulatory Visit
Admission: RE | Admit: 2016-06-16 | Discharge: 2016-06-16 | Disposition: A | Discharge status: Home / Self Care | Payer: Medicare Other | Source: Ambulatory Visit | Attending: Oncology | Admitting: Oncology

## 2016-06-16 ENCOUNTER — Encounter (INDEPENDENT_AMBULATORY_CARE_PROVIDER_SITE_OTHER): Payer: Self-pay

## 2016-06-16 DIAGNOSIS — Z171 Estrogen receptor negative status [ER-]: Secondary | ICD-10-CM

## 2016-06-16 DIAGNOSIS — Z1231 Encounter for screening mammogram for malignant neoplasm of breast: Secondary | ICD-10-CM | POA: Insufficient documentation

## 2016-06-16 DIAGNOSIS — C50912 Malignant neoplasm of unspecified site of left female breast: Secondary | ICD-10-CM

## 2016-06-16 LAB — HCV RT-PCR, QUANT (NON-GRAPH)
HCV LOG10: UNDETERMINED {Log_copies}/mL
IU log10: UNDETERMINED Log10 IU/mL

## 2016-06-19 ENCOUNTER — Telehealth: Payer: Self-pay | Admitting: Gastroenterology

## 2016-06-19 NOTE — Telephone Encounter (Signed)
Pt notified of lab results

## 2016-06-19 NOTE — Telephone Encounter (Signed)
-----   Message from Lucilla Lame, MD sent at 06/19/2016  1:08 PM EDT ----- Let the patient know that her hepatitis C is now negative.

## 2016-06-19 NOTE — Telephone Encounter (Signed)
Results. Please call her either way. She thought they should be back by now.

## 2016-06-26 ENCOUNTER — Emergency Department
Admission: EM | Admit: 2016-06-26 | Discharge: 2016-06-26 | Disposition: A | Discharge status: Home / Self Care | Payer: Medicare Other | Attending: Emergency Medicine | Admitting: Emergency Medicine

## 2016-06-26 DIAGNOSIS — Z79899 Other long term (current) drug therapy: Secondary | ICD-10-CM | POA: Insufficient documentation

## 2016-06-26 DIAGNOSIS — H6123 Impacted cerumen, bilateral: Secondary | ICD-10-CM | POA: Insufficient documentation

## 2016-06-26 DIAGNOSIS — F17210 Nicotine dependence, cigarettes, uncomplicated: Secondary | ICD-10-CM | POA: Diagnosis not present

## 2016-06-26 DIAGNOSIS — Z853 Personal history of malignant neoplasm of breast: Secondary | ICD-10-CM | POA: Diagnosis not present

## 2016-06-26 DIAGNOSIS — H9202 Otalgia, left ear: Secondary | ICD-10-CM | POA: Diagnosis present

## 2016-06-26 DIAGNOSIS — N3001 Acute cystitis with hematuria: Secondary | ICD-10-CM

## 2016-06-26 LAB — URINALYSIS, COMPLETE (UACMP) WITH MICROSCOPIC
Bilirubin Urine: NEGATIVE
Glucose, UA: NEGATIVE mg/dL
KETONES UR: NEGATIVE mg/dL
NITRITE: POSITIVE — AB
Protein, ur: NEGATIVE mg/dL
Specific Gravity, Urine: 1.015 (ref 1.005–1.030)
pH: 5 (ref 5.0–8.0)

## 2016-06-26 MED ORDER — NAPROXEN 500 MG PO TABS: 500.0000 mg | ORAL_TABLET | Freq: Two times a day (BID) | ORAL | 0 refills | Status: DC

## 2016-06-26 MED ORDER — SULFAMETHOXAZOLE-TRIMETHOPRIM 800-160 MG PO TABS: 1.0000 | ORAL_TABLET | Freq: Two times a day (BID) | ORAL | 0 refills | Status: DC

## 2016-06-26 MED ORDER — OXYCODONE-ACETAMINOPHEN 5-325 MG PO TABS: 1.0000 | ORAL_TABLET | Freq: Four times a day (QID) | ORAL | 0 refills | Status: DC | PRN

## 2016-06-26 NOTE — ED Notes (Signed)
AAOx3.  Skin warm and dry.  NAD 

## 2016-06-26 NOTE — ED Notes (Signed)
Bilateral ears irrigated with 1/2 NS and 1/2 H2O2.  Large amount of wax flushed out of ears bilaterally.  Ear drums visualized after irrigation.  Patient tolerated well.

## 2016-06-26 NOTE — ED Triage Notes (Addendum)
Pt ambulatory, alert, oriented. States "I think I have a UTI and an ear infection." L ear pain. States urinary burning and frequency x few weeks. States "i tried to wait it out"  Ambulatory to toilet to collect urine sample.

## 2016-06-26 NOTE — ED Notes (Signed)
AAOx3.  Skin warm and dry. NAD.  C/O left ear being clogged up x 1 week.  C/O burning with urinating and some urinary incontinence x 2-3 weeks.  Also states urine has had an odor to it.

## 2016-06-26 NOTE — ED Provider Notes (Signed)
[facility] Emergency Department Provider Note   ____________________________________________   First MD Initiated Contact with Patient 06/26/16 1222     (approximate)  I have reviewed the triage vital signs and the nursing notes.   HISTORY  Chief Complaint Urinary Tract Infection and Otalgia    HPI Janet Zuniga is a 61 y.o. female patient complaining of left ear pain and decreased hearing in both ears. Patient state also has increased dysuria frequency for a few weeks. No palliative measures for either complaints. Patient URI signs or symptoms.  Patient denies fever, flank pain, vaginal discharge. Patient describes her ear pain as "dull". Patient describes urinary complaint as "burning and pressure". Patient rates the pain as 8/10.   Past Medical History:  Diagnosis Date  . Anxiety   . Arthritis   . Blood in stool   . Breast cancer ([place]) 2010   Left breast- chemo/radiation  . Hepatitis C 2007  . Hip fracture ([place])   . History of blood transfusion     Patient Active Problem List   Diagnosis Date Noted  . Malignant neoplasm of left female breast ([place]) 09/12/2015  . Severe major depression without psychotic features ([employer]) 09/12/2015  . Cocaine abuse 08/18/2015  . Alcohol abuse 08/03/2015  . Chronic use of opiate drugs therapeutic purposes 05/19/2015  . Leg reflex sympathetic dystrophy, right 05/19/2015  . Displaced spiral fracture of shaft of right femur ([place]) 01/12/2015  . Anxiety 01/12/2015  . Hypokalemia 01/12/2015  . Anxiety and depression 12/24/2014  . Chronic low back pain 12/24/2014  . Major depression 10/27/2014  . Substance induced mood disorder ([place]) 10/27/2014  . Chronic pain in right foot 10/20/2014  . Insomnia 06/05/2014  . Chronic pain syndrome 05/15/2014  . Raynaud's phenomenon without gangrene 05/15/2014  . Spinal stenosis of lumbar region 05/15/2014  . 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 Generalized anxiety disorder  05/04/2014  . Hepatitis C 03/12/2014  . Midline low back pain without sciatica 03/12/2014  . History of breast cancer in female 12/23/2013  . Chronic pain 12/23/2013  . Ankylosing spondylitis of lumbosacral region ([place]) 12/23/2013  . Depression 12/23/2013  . Osteoarthritis 10/05/2011    Past Surgical History:  Procedure Laterality Date  . BREAST BIOPSY Right 1983   neg  . BREAST EXCISIONAL BIOPSY Left 2010  . BREAST LUMPECTOMY Left 2010  . FEMUR FRACTURE SURGERY    . FOOT SURGERY  2000 and 2003  . INTRAMEDULLARY (IM) NAIL INTERTROCHANTERIC Right 01/12/2015   Procedure: INTRAMEDULLARY (IM) NAIL INTERTROCHANTRIC;  Surgeon: Earnestine Leys, MD;  Location: [facility] ORS;  Service: Orthopedics;  Laterality: Right;  . KNEE SURGERY Left 2004    Prior to Admission medications   Medication Sig Start Date End Date Taking? Authorizing Provider  busPIRone (BUSPAR) 10 MG tablet Take 1 tablet (10 mg total) by mouth 3 (three) times daily. 08/18/15   Clapacs, Madie Reno, MD  cyclobenzaprine (FLEXERIL) 10 MG tablet  10/12/15   [provider]  famotidine (PEPCID) 40 MG tablet Take 1 tablet (40 mg total) by mouth every evening. 06/12/16 06/12/17  Nance Pear, MD  FLUoxetine (PROZAC) 40 MG capsule Take 40 mg by mouth daily.     [provider]  ibuprofen (ADVIL,MOTRIN) 800 MG tablet Take by mouth. 10/12/15   [provider]  naproxen (NAPROSYN) 500 MG tablet Take 1 tablet (500 mg total) by mouth 2 (two) times daily with a meal. 06/26/16   Sable Feil, PA-C  oxyCODONE-acetaminophen (ROXICET) 5-325 MG  tablet Take 1 tablet by mouth every 6 (six) hours as needed for severe pain. 06/26/16   Sable Feil, PA-C  sucralfate (CARAFATE) 1 g tablet Take 1 tablet (1 g total) by mouth 4 (four) times daily. 06/12/16   Nance Pear, MD  sulfamethoxazole-trimethoprim (BACTRIM DS,SEPTRA DS) 800-160 MG tablet Take 1 tablet by mouth 2 (two) times daily. 06/26/16   Sable Feil, PA-C  traZODone  (DESYREL) 150 MG tablet Take 1 tablet (150 mg total) by mouth at bedtime. 08/18/15   Clapacs, Madie Reno, MD    Allergies Tramadol and Penicillins  Family History  Problem Relation Age of Onset  . Arthritis Mother   . Stroke Mother   . Heart disease Mother   . Hypertension Mother   . Hypertension Father   . Hypertension Brother   . Heart disease Maternal Aunt   . Breast cancer Neg Hx     Social History Social History  Substance Use Topics  . Smoking status: Current Every Day Smoker    Packs/day: 1.00    Types: Cigarettes  . Smokeless tobacco: Never Used  . Alcohol use Yes    Review of Systems  Constitutional: No fever/chills Eyes: No visual changes. ENT: No sore throat. Cardiovascular: Denies chest pain. Respiratory: Denies shortness of breath. Gastrointestinal: No abdominal pain.  No nausea, no vomiting.  No diarrhea.  No constipation. Genitourinary: Negative for dysuria. Musculoskeletal: Negative for back pain. Skin: Negative for rash. Neurological: Negative for headaches, focal weakness or numbness. Psychiatric:Anxiety Hematological/Lymphatic:Hepatitis C Allergic/Immunilogical: Tramadol and penicillin ____________________________________________   PHYSICAL EXAM:  VITAL SIGNS: ED Triage Vitals  Enc Vitals Group     BP 06/26/16 1200 (!) 142/92     Pulse Rate 06/26/16 1200 100     Resp 06/26/16 1200 18     Temp 06/26/16 1200 98 F (36.7 C)     Temp Source 06/26/16 1200 Oral     SpO2 06/26/16 1200 96 %     Weight 06/26/16 1201 154 lb (69.9 kg)     Height 06/26/16 1201 5\' 1"  (1.549 m)     Head Circumference --      Peak Flow --      Pain Score 06/26/16 1158 8     Pain Loc --      Pain Edu? --      Excl. in [place]? --     Constitutional: Alert and oriented. Well appearing and in no acute distress. Head: Atraumatic. Nose: No congestion/rhinnorhea. EARS: TMs are not visible secondary to impaction Mouth/Throat: Mucous membranes are moist.  Oropharynx  non-erythematous. Neck: No stridor.  No cervical spine tenderness to palpation. Cardiovascular: Normal rate, regular rhythm. Grossly normal heart sounds.  Good peripheral circulation. Respiratory: Normal respiratory effort.  No retractions. Lungs CTAB. Gastrointestinal: Soft and nontender.  abdominal distention. No abdominal bruits. No CVA tenderness. Genitourinary: Deferred Musculoskeletal: No lower extremity tenderness nor edema.  No joint effusions. Neurologic:  Normal speech and language. No gross focal neurologic deficits are appreciated. No gait instability. Skin:  Skin is warm, dry and intact. No rash noted. Psychiatric: Mood and affect are normal. Speech and behavior are normal.  ____________________________________________   LABS (all labs ordered are listed, but only abnormal results are displayed)  Labs Reviewed  URINALYSIS, COMPLETE (UACMP) WITH MICROSCOPIC - Abnormal; Notable for the following:       Result Value   Color, Urine YELLOW (*)    APPearance CLOUDY (*)    Hgb urine dipstick MODERATE (*)  Nitrite POSITIVE (*)    Leukocytes, UA LARGE (*)    Bacteria, UA MANY (*)    Squamous Epithelial / LPF 6-30 (*)    All other components within normal limits   ____________________________________________  EKG   ____________________________________________  RADIOLOGY  No results found.  ____________________________________________   PROCEDURES  Procedure(s) performed: None  Procedures  Critical Care performed: No  ____________________________________________   INITIAL IMPRESSION / ASSESSMENT AND PLAN / ED COURSE  Pertinent labs & imaging results that were available during my care of the patient were reviewed by me and considered in my medical decision making (see chart for details).  Cerumen impaction and UTI. Ear impaction remove with irrigation. Discussed lab results with patient. Patient given discharge care instructions. Patient advised follow-up  family doctor for continued care.      ____________________________________________   FINAL CLINICAL IMPRESSION(S) / ED DIAGNOSES  Final diagnoses:  Acute cystitis with hematuria  Bilateral impacted cerumen      NEW MEDICATIONS STARTED DURING THIS VISIT:  New Prescriptions   NAPROXEN (NAPROSYN) 500 MG TABLET    Take 1 tablet (500 mg total) by mouth 2 (two) times daily with a meal.   OXYCODONE-ACETAMINOPHEN (ROXICET) 5-325 MG TABLET    Take 1 tablet by mouth every 6 (six) hours as needed for severe pain.   SULFAMETHOXAZOLE-TRIMETHOPRIM (BACTRIM DS,SEPTRA DS) 800-160 MG TABLET    Take 1 tablet by mouth 2 (two) times daily.     Note:  This document was prepared using Dragon voice recognition software and may include unintentional dictation errors.    Sable Feil, PA-C 06/26/16 1309    Earleen Newport, MD 06/26/16 [phone]

## 2016-06-26 NOTE — ED Notes (Signed)
Patient denies pain and is resting comfortably.  

## 2016-07-13 DIAGNOSIS — M810 Age-related osteoporosis without current pathological fracture: Secondary | ICD-10-CM | POA: Insufficient documentation

## 2016-07-13 DIAGNOSIS — M1990 Unspecified osteoarthritis, unspecified site: Secondary | ICD-10-CM | POA: Insufficient documentation

## 2016-07-13 DIAGNOSIS — I10 Essential (primary) hypertension: Secondary | ICD-10-CM | POA: Insufficient documentation

## 2016-07-13 DIAGNOSIS — M459 Ankylosing spondylitis of unspecified sites in spine: Secondary | ICD-10-CM | POA: Insufficient documentation

## 2016-07-18 ENCOUNTER — Ambulatory Visit: Payer: Medicare Other | Admitting: Gastroenterology

## 2016-08-10 ENCOUNTER — Ambulatory Visit (INDEPENDENT_AMBULATORY_CARE_PROVIDER_SITE_OTHER): Payer: Medicare Other | Admitting: Family Medicine

## 2016-08-10 ENCOUNTER — Encounter: Payer: Self-pay | Admitting: Family Medicine

## 2016-08-10 VITALS — BP 155/97 | HR 101 | Temp 98.0°F | Ht 61.5 in | Wt 161.0 lb

## 2016-08-10 DIAGNOSIS — R350 Frequency of micturition: Secondary | ICD-10-CM

## 2016-08-10 DIAGNOSIS — I10 Essential (primary) hypertension: Secondary | ICD-10-CM | POA: Diagnosis not present

## 2016-08-10 DIAGNOSIS — Z7689 Persons encountering health services in other specified circumstances: Secondary | ICD-10-CM | POA: Diagnosis not present

## 2016-08-10 DIAGNOSIS — Z1211 Encounter for screening for malignant neoplasm of colon: Secondary | ICD-10-CM | POA: Diagnosis not present

## 2016-08-10 DIAGNOSIS — F419 Anxiety disorder, unspecified: Secondary | ICD-10-CM

## 2016-08-10 DIAGNOSIS — M459 Ankylosing spondylitis of unspecified sites in spine: Secondary | ICD-10-CM

## 2016-08-10 MED ORDER — GABAPENTIN 300 MG PO CAPS: ORAL_CAPSULE | ORAL | 1 refills | Status: DC

## 2016-08-10 MED ORDER — HYDROCHLOROTHIAZIDE 12.5 MG PO CAPS: 12.5000 mg | ORAL_CAPSULE | Freq: Every day | ORAL | 1 refills | Status: DC

## 2016-08-10 NOTE — Progress Notes (Signed)
BP (!) 155/97   Pulse (!) 101   Temp 98 F (36.7 C)   Ht 5' 1.5" (1.562 m)   Wt 161 lb (73 kg)   SpO2 97%   BMI 29.93 kg/m    Subjective:    Patient ID: Janet Zuniga, female    DOB: [birth year], 61 y.o.   MRN: [id number]  HPI: Janet Zuniga is a 61 y.o. female  Chief Complaint  Patient presents with  . Establish Care  . Urinary Tract Infection    x few days, frequency, odor, urgency  . Anxiety  . Medication Refill    she'd like a refill on the cyclobenzaprine   Patient presents today to establish care. Has multiple concerns today. Severe muscle spasms in her hands and legs. Has been on flexeril for this and wanting to know what else she can take.   Also struggling with anxiety. Taking prozac, buspar, and trazodone but sxs not under good control. Wanting something stronger. Hx of cocaine and alcohol abuse, SI, and substance abuse induce mood disorder and major depression. Multiple ER visits just in the past year or so for these.   Hx of breast cancer in 2010, in remission. Hx of Hepatitis C, resolved with Harvoni.  Hx of ankylosing spondylitis. Followed by Rheumatology. Previously under good control with Enbrel but was taken off when the Hep C was detected. Following up with them soon to discuss resuming treatment.   Urinary sxs x several days ago. Urgency, strong odor, increased frequency. Denies fever, chills, N/V/D, hematuria.   Past Medical History:  Diagnosis Date  . Ankylosing spondylitis ([place])   . Anxiety   . Arthritis   . Blood in stool   . Breast cancer ([place]) 2010   Left breast- chemo/radiation  . Chronic low back pain   . Chronic pain   . Depression   . Headache   . Hepatitis C 2007  . Hip fracture ([place])   . History of blood transfusion   . Hypertension   . Insomnia   . OA (osteoarthritis)   . Osteoporosis   . Spinal stenosis of lumbar region   . Spondyloarthritis [facility])    Social History   Social History  . Marital status: Legally Separated   Spouse name: N/A  . Number of children: N/A  . Years of education: N/A   Occupational History  . Not on file.   Social History Main Topics  . Smoking status: Current Every Day Smoker    Packs/day: 0.50    Types: Cigarettes  . Smokeless tobacco: Never Used  . Alcohol use Yes     Comment: occasionally a glass of wine  . Drug use: Yes    Types: Marijuana  . Sexual activity: Not on file   Other Topics Concern  . Not on file   Social History Narrative  . No narrative on file   Relevant past medical, surgical, family and social history reviewed and updated as indicated. Interim medical history since our last visit reviewed. Allergies and medications reviewed and updated.  Review of Systems  Constitutional: Negative.   HENT: Negative.   Respiratory: Negative.   Cardiovascular: Negative.   Genitourinary: Positive for frequency and urgency.  Musculoskeletal: Positive for back pain.  Neurological: Negative.   Psychiatric/Behavioral: Positive for dysphoric mood and sleep disturbance. The patient is nervous/anxious.     Per HPI unless specifically indicated above     Objective:    BP (!) 155/97   Pulse Marland Kitchen)  101   Temp 98 F (36.7 C)   Ht 5' 1.5" (1.562 m)   Wt 161 lb (73 kg)   SpO2 97%   BMI 29.93 kg/m   Wt Readings from Last 3 Encounters:  08/10/16 161 lb (73 kg)  06/26/16 154 lb (69.9 kg)  06/12/16 154 lb (69.9 kg)    Physical Exam  Constitutional: She is oriented to person, place, and time. She appears well-developed and well-nourished.  HENT:  Head: Atraumatic.  Eyes: Conjunctivae and EOM are normal. No scleral icterus.  Neck: Normal range of motion. Neck supple.  Cardiovascular: Normal rate and normal heart sounds.   Pulmonary/Chest: Effort normal and breath sounds normal.  Abdominal: Soft. Bowel sounds are normal. There is no tenderness.  Musculoskeletal:  Slow, antalgic gait d/t chronic back issues  Neurological: She is alert and oriented to person,  place, and time.  Skin: Skin is warm and dry. No rash noted.  Psychiatric:  Tearful when discussing her anxiety, but otherwise normal behavior and affect  Nursing note and vitals reviewed.     Assessment & Plan:   Problem List Items Addressed This Visit      Cardiovascular and Mediastinum   Hypertension    Start 12.5 mg HCTZ. CMP today for baseline. Check BPs at home as able, report any persistent abnormal readings or side effects. Recheck BP and BMP in 1 month. Discussed DASH diet, exercise, good hydration      Relevant Medications   hydrochlorothiazide (MICROZIDE) 12.5 MG capsule   Other Relevant Orders   Comprehensive metabolic panel (Completed)     Musculoskeletal and Integument   Ankylosing spondylitis (HCC)    F/u as scheduled with Rheumatology        Other   Anxiety    Given hx of substance abuse, poor control on three medications, and complicated hx with SI, will refer to Psychiatry for management.       Relevant Medications   busPIRone (BUSPAR) 30 MG tablet   Other Relevant Orders   Ambulatory referral to Psychiatry    Other Visit Diagnoses    Encounter to establish care    -  Primary   Urinary frequency       U/A negative for UTI. Start probiotic, cranberry tablet, and push fluids. F/u if worsening sxs.    Relevant Orders   UA/M w/rflx Culture, Routine (STAT) (Completed)   Screening for colon cancer       Pt was overdue for colonscopy and wanted to get the process going. Ordered today.    Relevant Orders   Ambulatory referral to Gastroenterology       Follow up plan: Return in about 4 weeks (around 09/07/2016) for HTN.

## 2016-08-11 ENCOUNTER — Telehealth: Payer: Self-pay | Admitting: Family Medicine

## 2016-08-11 ENCOUNTER — Other Ambulatory Visit: Payer: Self-pay

## 2016-08-11 ENCOUNTER — Telehealth: Payer: Self-pay

## 2016-08-11 DIAGNOSIS — Z748 Other problems related to care provider dependency: Secondary | ICD-10-CM

## 2016-08-11 LAB — COMPREHENSIVE METABOLIC PANEL
ALK PHOS: 99 IU/L (ref 39–117)
ALT: 13 IU/L (ref 0–32)
AST: 13 IU/L (ref 0–40)
Albumin/Globulin Ratio: 1.3 (ref 1.2–2.2)
Albumin: 4.1 g/dL (ref 3.5–5.5)
BUN / CREAT RATIO: 20 (ref 9–23)
BUN: 12 mg/dL (ref 6–24)
CHLORIDE: 104 mmol/L (ref 96–106)
CO2: 21 mmol/L (ref 20–29)
Calcium: 9.3 mg/dL (ref 8.7–10.2)
Creatinine, Ser: 0.59 mg/dL (ref 0.57–1.00)
GFR calc Af Amer: 117 mL/min/{1.73_m2} (ref >59)
GFR calc non Af Amer: 101 mL/min/{1.73_m2} (ref >59)
Globulin, Total: 3.1 g/dL (ref 1.5–4.5)
Glucose: 94 mg/dL (ref 65–99)
Potassium: 3.5 mmol/L (ref 3.5–5.2)
Sodium: 143 mmol/L (ref 134–144)
Total Protein: 7.2 g/dL (ref 6.0–8.5)

## 2016-08-11 NOTE — Telephone Encounter (Signed)
Gastroenterology Pre-Procedure Review  Request Date: 08/11/16 Requesting Physician: Dr. Allen Norris  PATIENT REVIEW QUESTIONS: The patient responded to the following health history questions as indicated:    1. Are you having any GI issues? no 2. Do you have a personal history of Polyps? no 3. Do you have a family history of Colon Cancer or Polyps? no 4. Diabetes Mellitus? no 5. Joint replacements in the past 12 months?no 6. Major health problems in the past 3 months?no 7. Any artificial heart valves, MVP, or defibrillator?no    MEDICATIONS & ALLERGIES:    Patient reports the following regarding taking any anticoagulation/antiplatelet therapy:   Plavix, Coumadin, Eliquis, Xarelto, Lovenox, Pradaxa, Brilinta, or Effient? no Aspirin? no  Patient confirms/reports the following medications:  Current Outpatient Prescriptions  Medication Sig Dispense Refill  . busPIRone (BUSPAR) 30 MG tablet Take 30 mg by mouth 2 (two) times daily.    . cyclobenzaprine (FLEXERIL) 10 MG tablet     . famotidine (PEPCID) 40 MG tablet Take 1 tablet (40 mg total) by mouth every evening. 30 tablet 1  . FLUoxetine (PROZAC) 40 MG capsule Take 40 mg by mouth daily.     Marland Kitchen gabapentin (NEURONTIN) 300 MG capsule Take 1 tablet nightly x 3-4 days, can increase by 1 tablet for several days, then can increase to three times daily as tolerated 90 capsule 1  . hydrochlorothiazide (MICROZIDE) 12.5 MG capsule Take 1 capsule (12.5 mg total) by mouth daily. 30 capsule 1  . ibuprofen (ADVIL,MOTRIN) 800 MG tablet Take by mouth.    . traZODone (DESYREL) 150 MG tablet Take 1 tablet (150 mg total) by mouth at bedtime. 30 tablet 0   No current facility-administered medications for this visit.     Patient confirms/reports the following allergies:  Allergies  Allergen Reactions  . Tramadol Nausea Only  . Penicillins Rash and Other (See Comments)    Has patient had a PCN reaction causing immediate rash, facial/tongue/throat swelling, SOB  or lightheadedness with hypotension: Yes Has patient had a PCN reaction causing severe rash involving mucus membranes or skin necrosis: No Has patient had a PCN reaction that required hospitalization No Has patient had a PCN reaction occurring within the last 10 years: No If all of the above answers are "NO", then may proceed with Cephalosporin use.     No orders of the defined types were placed in this encounter.   AUTHORIZATION INFORMATION Primary Insurance: 1D#: Group #:  Secondary Insurance: 1D#: Group #:  SCHEDULE INFORMATION: Date: 09/21/16 Time: Location:09/21/16

## 2016-08-11 NOTE — Telephone Encounter (Signed)
I will check with our nurse health advisor and see if she has suggestions about transportation.

## 2016-08-13 LAB — UA/M W/RFLX CULTURE, ROUTINE
BILIRUBIN UA: NEGATIVE
GLUCOSE, UA: NEGATIVE
KETONES UA: NEGATIVE
LEUKOCYTES UA: NEGATIVE
Nitrite, UA: NEGATIVE
Specific Gravity, UA: 1.025 (ref 1.005–1.030)
UUROB: 0.2 mg/dL (ref 0.2–1.0)
pH, UA: 6 (ref 5.0–7.5)

## 2016-08-13 LAB — URINE CULTURE, REFLEX

## 2016-08-13 LAB — MICROSCOPIC EXAMINATION

## 2016-08-13 NOTE — Patient Instructions (Signed)
Follow up in 6 months for BP recheck and CPE

## 2016-08-13 NOTE — Assessment & Plan Note (Signed)
F/u as scheduled with Rheumatology

## 2016-08-13 NOTE — Assessment & Plan Note (Addendum)
Start 12.5 mg HCTZ. CMP today for baseline. Check BPs at home as able, report any persistent abnormal readings or side effects. Recheck BP and BMP in 1 month. Discussed DASH diet, exercise, good hydration

## 2016-08-13 NOTE — Assessment & Plan Note (Signed)
Given hx of substance abuse, poor control on three medications, and complicated hx with SI, will refer to Psychiatry for management.

## 2016-08-14 ENCOUNTER — Other Ambulatory Visit: Payer: Self-pay

## 2016-08-14 DIAGNOSIS — Z1211 Encounter for screening for malignant neoplasm of colon: Secondary | ICD-10-CM

## 2016-08-14 NOTE — Telephone Encounter (Signed)
I placed a referral to our connected care team for her transportation needs.

## 2016-08-14 NOTE — Telephone Encounter (Signed)
Can you help her arrange transportation? The clinic she's referred to is in [place]. Thanks!

## 2016-08-14 NOTE — Telephone Encounter (Signed)
Called patient and informed her that connected care would be getting in touch with her in regards to transportation.

## 2016-08-16 ENCOUNTER — Telehealth: Payer: Self-pay

## 2016-08-16 NOTE — Telephone Encounter (Signed)
Noted  

## 2016-08-16 NOTE — Telephone Encounter (Signed)
She left a message stating she will not go to [place] because it's going to cost her $40.

## 2016-08-22 ENCOUNTER — Other Ambulatory Visit: Payer: Self-pay | Admitting: Family Medicine

## 2016-08-22 MED ORDER — CYCLOBENZAPRINE HCL 10 MG PO TABS: 10.0000 mg | ORAL_TABLET | Freq: Every day | ORAL | 0 refills | Status: DC

## 2016-08-22 NOTE — Telephone Encounter (Signed)
Routing to provider. Patient seen for establish care visit 08/10/16.

## 2016-08-22 NOTE — Telephone Encounter (Signed)
Patient requesting a refill on mediation for flexeril to [facility].  Please Advise.  Thank you

## 2016-08-23 ENCOUNTER — Other Ambulatory Visit: Payer: Self-pay | Admitting: Internal Medicine

## 2016-08-23 DIAGNOSIS — M5442 Lumbago with sciatica, left side: Secondary | ICD-10-CM

## 2016-08-30 ENCOUNTER — Telehealth: Payer: Self-pay | Admitting: Family Medicine

## 2016-08-30 DIAGNOSIS — M545 Low back pain: Principal | ICD-10-CM

## 2016-08-30 DIAGNOSIS — G8929 Other chronic pain: Secondary | ICD-10-CM

## 2016-08-30 NOTE — Telephone Encounter (Signed)
Please advise 

## 2016-08-30 NOTE — Telephone Encounter (Signed)
Janet Zuniga, I spoke with Janet Zuniga to try to schedule her for AWV but has to find out about transportation. She is scheduled to see Apolonio Schneiders on September 10th and she said she has been in a lot of pain and is wondering if Apolonio Schneiders can prescribe something for her before her visit to help manage her pain from rheumatoid arthritis. Please call her back at  Home. She said she would like to go to pain clinic but the only one she knows of isto is in [place] and she cannot find transportation. Could she possibly go to the one at [facility]. She does have an appt with her RA doctor in September as well. Thanks, Curt Bears

## 2016-08-30 NOTE — Telephone Encounter (Signed)
Referral placed to [facility] pain clinic. Await Rheumatology recommendations. Continue gabapentin, flexeril, and tylenol/ibuprofen prn

## 2016-08-31 ENCOUNTER — Ambulatory Visit: Payer: Medicare Other

## 2016-08-31 NOTE — Telephone Encounter (Signed)
Left message on machine for pt to return call to the office.  

## 2016-08-31 NOTE — Telephone Encounter (Signed)
Spoke with patient. Rheumatology had increased gabapentin to 400 mg daily. Pt states she cannot take this medication anymore. Advised pt to call her rheumatology office for further recommendation since pain is due to Rheumatoid arthritis. Pt has MRI scheduled for 09/12/16

## 2016-09-11 ENCOUNTER — Ambulatory Visit: Payer: Medicare Other | Admitting: Family Medicine

## 2016-09-12 ENCOUNTER — Ambulatory Visit
Admission: RE | Admit: 2016-09-12 | Discharge: 2016-09-12 | Disposition: A | Discharge status: Home / Self Care | Payer: Medicare Other | Source: Ambulatory Visit | Attending: Internal Medicine | Admitting: Internal Medicine

## 2016-09-12 DIAGNOSIS — M48061 Spinal stenosis, lumbar region without neurogenic claudication: Secondary | ICD-10-CM | POA: Diagnosis not present

## 2016-09-12 DIAGNOSIS — M47896 Other spondylosis, lumbar region: Secondary | ICD-10-CM | POA: Insufficient documentation

## 2016-09-12 DIAGNOSIS — M5442 Lumbago with sciatica, left side: Secondary | ICD-10-CM

## 2016-09-18 ENCOUNTER — Ambulatory Visit: Payer: Self-pay | Admitting: Nurse Practitioner

## 2016-09-18 DIAGNOSIS — M25519 Pain in unspecified shoulder: Secondary | ICD-10-CM | POA: Insufficient documentation

## 2016-09-18 NOTE — Progress Notes (Deleted)
Patient's Name: Janet Zuniga  MRN: [id number]  Referring Provider: Volney American,*  DOB: 1955/01/12  PCP: Volney American, PA-C  DOS: 09/18/2016  Note by: Dionisio David NP  Service setting: Ambulatory outpatient  Specialty: Interventional Pain Management  Location: [facility] (AMB) Pain Management Facility    Patient type: New Patient    Primary Reason(s) for Visit: Initial Patient Evaluation CC: No chief complaint on file.  HPI  Janet Zuniga is a 61 y.o. year old, female patient, who comes today for an initial evaluation. She has History of breast cancer in female; Chronic pain; Ankylosing spondylitis of lumbosacral region [facility]); Depression; Major depression; Substance induced mood disorder ([place]); Fracture of shaft of femur ([place]); Anxiety; Hypokalemia; Alcohol abuse; Cocaine abuse; Anxiety and depression; Chronic bilateral low back pain with bilateral sciatica; Chronic pain in right foot; Chronic pain syndrome; Chronic use of opiate drugs therapeutic purposes; Generalized anxiety disorder; Hepatitis C; Insomnia; Leg reflex sympathetic dystrophy, right; Malignant neoplasm of left female breast ([place]); Midline low back pain without sciatica; Osteoarthritis; Raynaud's phenomenon without gangrene; Severe major depression without psychotic features ([place]); Spinal stenosis of lumbar region; 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 Her primarily concern today is the No chief complaint on file.  Pain Assessment: Location:     Radiating:   Onset:   Duration:   Quality:   Severity:  /10 (self-reported pain score)  Note: Reported level is compatible with observation.                   Effect on ADL:   Timing:   Modifying factors:    Onset and Duration: {Hx; Onset and Duration:210120511} Cause of pain: {Hx; Cause:210120521} Severity: {Pain Severity:210120502} Timing:  {Symptoms; Timing:210120501} Aggravating Factors: {Causes; Aggravating pain factors:210120507} Alleviating Factors: {Causes; Alleviating Factors:210120500} Associated Problems: {Hx; Associated problems:210120515} Quality of Pain: {Hx; Symptom quality or Descriptor:210120531} Previous Examinations or Tests: {Hx; Previous examinations or test:210120529} Previous Treatments: {Hx; Previous Treatment:210120503}  The patient comes into the clinics today for the first time for a chronic pain management evaluation. ***  Today I took the time to provide the patient with information regarding this pain practice. The patient was informed that the practice is divided into two sections: an interventional pain management section, as well as a completely separate and distinct medication management section. I explained that there are procedure days for interventional therapies, and evaluation days for follow-ups and medication management. Because of the amount of documentation required during both, they are kept separated. This means that there is the possibility that *** may be scheduled for a procedure on one day, and medication management the next. I have also informed *** that because of staffing and facility limitations, this practice will no longer take patients for medication management only. To illustrate the reasons for this, I gave the patient the example of surgeons, and how inappropriate it would be to refer a patient to his/her care, just to write for the post-surgical antibiotics on a surgery done by a different surgeon.   Because interventional pain management is part of the board-certified specialty for the doctors, the patient was informed that joining this practice means that they are open to any and all interventional therapies. I made it clear that this does not mean that they will be forced to have any procedures done. What this means is that I believe interventional therapies to be essential part  of the diagnosis and  proper management of chronic pain conditions. Therefore, patients not interested in these interventional alternatives will be better served under the care of a different practitioner.  The patient was also made aware of my Comprehensive Pain Management Safety Guidelines where by joining this practice, they limit all of their nerve blocks and joint injections to those done by our practice, for as long as we are retained to manage their care. Historic Controlled Substance Pharmacotherapy Review  PMP and historical list of controlled substances: *** Highest opioid analgesic regimen found: *** Most recent opioid analgesic: *** Current opioid analgesics: *** Highest recorded MME/day: *** mg/day MME/day: *** mg/day Medications: The patient did not bring the medication(s) to the appointment, as requested in our "New Patient Package" Pharmacodynamics: Desired effects: Analgesia: The patient reports >50% benefit. Reported improvement in function: The patient reports medication allows her to accomplish basic ADLs. Clinically meaningful improvement in function (CMIF): Sustained CMIF goals met Perceived effectiveness: Described as relatively effective, allowing for increase in activities of daily living (ADL) Undesirable effects: Side-effects or Adverse reactions: None reported Historical Monitoring: The patient  reports that she uses drugs, including Marijuana. List of all UDS Test(s): Lab Results  Component Value Date   MDMA NONE DETECTED 08/17/2015   MDMA NONE DETECTED 08/03/2015   MDMA NONE DETECTED 10/26/2014   MDMA NEGATIVE 10/15/2013   MDMA NEGATIVE 02/04/2011   COCAINSCRNUR POSITIVE (A) 08/17/2015   COCAINSCRNUR NONE DETECTED 08/03/2015   COCAINSCRNUR NONE DETECTED 10/26/2014   COCAINSCRNUR NEGATIVE 10/15/2013   COCAINSCRNUR NEGATIVE 02/04/2011   PCPSCRNUR NONE DETECTED 08/17/2015   PCPSCRNUR NONE DETECTED 08/03/2015   PCPSCRNUR NONE DETECTED 10/26/2014    PCPSCRNUR NEGATIVE 10/15/2013   PCPSCRNUR NEGATIVE 02/04/2011   THCU POSITIVE (A) 08/17/2015   THCU NONE DETECTED 08/03/2015   THCU NONE DETECTED 10/26/2014   THCU NEGATIVE 10/15/2013   THCU NEGATIVE 02/04/2011   ETH 6 (H) 08/17/2015   ETH 147 (H) 08/03/2015   ETH 242 (H) 06/05/2015   ETH 138 (H) 10/26/2014   List of all Serum Drug Screening Test(s):  No results found for: AMPHSCRSER, BARBSCRSER, BENZOSCRSER, COCAINSCRSER, PCPSCRSER, PCPQUANT, THCSCRSER, CANNABQUANT, OPIATESCRSER, OXYSCRSER, PROPOXSCRSER Historical Background Evaluation: [place] PDMP: Six (6) year initial data search conducted.             [place] Department of public safety, offender search: Editor, commissioning Information) Non-contributory Risk Assessment Profile: Aberrant behavior: None observed or detected today Risk factors for fatal opioid overdose: None identified today Fatal overdose hazard ratio (HR): Calculation deferred Non-fatal overdose hazard ratio (HR): Calculation deferred Risk of opioid abuse or dependence: 0.7-3.0% with doses ? 36 MME/day and 6.1-26% with doses ? 120 MME/day. Substance use disorder (SUD) risk level: Pending results of Medical Psychology Evaluation for SUD Opioid risk tool (ORT) (Total Score):    ORT Scoring interpretation table:  Score <3 = Low Risk for SUD  Score between 4-7 = Moderate Risk for SUD  Score >8 = High Risk for Opioid Abuse   PHQ-2 Depression Scale:  Total score:    PHQ-2 Scoring interpretation table: (Score and probability of major depressive disorder)  Score 0 = No depression  Score 1 = 15.4% Probability  Score 2 = 21.1% Probability  Score 3 = 38.4% Probability  Score 4 = 45.5% Probability  Score 5 = 56.4% Probability  Score 6 = 78.6% Probability   PHQ-9 Depression Scale:  Total score:    PHQ-9 Scoring interpretation table:  Score 0-4 = No depression  Score 5-9 = Mild depression  Score 10-14 =  Moderate depression  Score 15-19 = Moderately severe depression  Score 20-27 =  Severe depression (2.4 times higher risk of SUD and 2.89 times higher risk of overuse)   Pharmacologic Plan: Pending ordered tests and/or consults  Meds  The patient has a current medication list which includes the following prescription(s): buspirone, cyclobenzaprine, famotidine, fluoxetine, gabapentin, hydrochlorothiazide, ibuprofen, and trazodone.  Current Outpatient Prescriptions on File Prior to Visit  Medication Sig  . busPIRone (BUSPAR) 30 MG tablet Take 30 mg by mouth 2 (two) times daily.  . cyclobenzaprine (FLEXERIL) 10 MG tablet Take 1 tablet (10 mg total) by mouth at bedtime.  . famotidine (PEPCID) 40 MG tablet Take 1 tablet (40 mg total) by mouth every evening.  Marland Kitchen FLUoxetine (PROZAC) 40 MG capsule Take 40 mg by mouth daily.   Marland Kitchen gabapentin (NEURONTIN) 300 MG capsule Take 1 tablet nightly x 3-4 days, can increase by 1 tablet for several days, then can increase to three times daily as tolerated  . hydrochlorothiazide (MICROZIDE) 12.5 MG capsule Take 1 capsule (12.5 mg total) by mouth daily.  Marland Kitchen ibuprofen (ADVIL,MOTRIN) 800 MG tablet Take by mouth.  . traZODone (DESYREL) 150 MG tablet Take 1 tablet (150 mg total) by mouth at bedtime.   No current facility-administered medications on file prior to visit.    Imaging Review  Cervical Imaging: Cervical MR wo contrast: No results found for this or any previous visit. Cervical MR wo contrast: No results found for this or any previous visit. Cervical MR w/wo contrast: No results found for this or any previous visit. Cervical MR w contrast: No results found for this or any previous visit. Cervical CT wo contrast: No results found for this or any previous visit. Cervical CT w/wo contrast: No results found for this or any previous visit. Cervical CT w/wo contrast: No results found for this or any previous visit. Cervical CT w contrast: No results found for this or any previous visit. Cervical CT outside: No results found for this or any  previous visit. Cervical DG 1 view: No results found for this or any previous visit. Cervical DG 2-3 views: No results found for this or any previous visit. Cervical DG F/E views: No results found for this or any previous visit. Cervical DG 2-3 clearing views: No results found for this or any previous visit. Cervical DG Bending/F/E views: No results found for this or any previous visit. Cervical DG complete: No results found for this or any previous visit. Cervical DG Myelogram views: No results found for this or any previous visit. Cervical DG Myelogram views: No results found for this or any previous visit. Cervical Discogram views: No results found for this or any previous visit.  Shoulder Imaging: Shoulder-R MR w contrast: No results found for this or any previous visit. Shoulder-L MR w contrast: No results found for this or any previous visit. Shoulder-R MR w/wo contrast: No results found for this or any previous visit. Shoulder-L MR w/wo contrast: No results found for this or any previous visit. Shoulder-R MR wo contrast: No results found for this or any previous visit. Shoulder-L MR wo contrast: No results found for this or any previous visit. Shoulder-R CT w contrast: No results found for this or any previous visit. Shoulder-L CT w contrast: No results found for this or any previous visit. Shoulder-R CT w/wo contrast: No results found for this or any previous visit. Shoulder-L CT w/wo contrast: No results found for this or any previous visit. Shoulder-R CT wo  contrast: No results found for this or any previous visit. Shoulder-L CT wo contrast: No results found for this or any previous visit. Shoulder-R DG Arthrogram: No results found for this or any previous visit. Shoulder-L DG Arthrogram: No results found for this or any previous visit. Shoulder-R DG 1 view: No results found for this or any previous visit. Shoulder-L DG 1 view: No results found for this or any previous  visit. Shoulder-R DG: No results found for this or any previous visit. Shoulder-L DG: No results found for this or any previous visit.  Thoracic Imaging: Thoracic MR wo contrast: No results found for this or any previous visit. Thoracic MR wo contrast: No results found for this or any previous visit. Thoracic MR w/wo contrast: No results found for this or any previous visit. Thoracic MR w contrast: No results found for this or any previous visit. Thoracic CT wo contrast: No results found for this or any previous visit. Thoracic CT w/wo contrast: No results found for this or any previous visit. Thoracic CT w/wo contrast: No results found for this or any previous visit. Thoracic CT w contrast: No results found for this or any previous visit. Thoracic DG 2-3 views: No results found for this or any previous visit. Thoracic DG 4 views: No results found for this or any previous visit. Thoracic DG: No results found for this or any previous visit. Thoracic DG w/swimmers view: No results found for this or any previous visit. Thoracic DG Myelogram views: No results found for this or any previous visit. Thoracic DG Myelogram views: No results found for this or any previous visit.  Lumbosacral Imaging: Lumbar MR wo contrast:  Results for orders placed during the hospital encounter of 09/12/16  MR LUMBAR SPINE WO CONTRAST   Narrative CLINICAL DATA:  [place] 2 weeks ago. Low back pain with bilateral leg pain.  EXAM: MRI LUMBAR SPINE WITHOUT CONTRAST  TECHNIQUE: Multiplanar, multisequence MR imaging of the lumbar spine was performed. No intravenous contrast was administered.  COMPARISON:  None.  FINDINGS: Segmentation:  Standard.  Alignment:  Physiologic.  Vertebrae:  No fracture, evidence of discitis, or bone lesion.  Conus medullaris: Extends to the T12 level and appears normal.  Paraspinal and other soft tissues: No paraspinal abnormality.  Disc levels:  Disc spaces: Disc  desiccation throughout the lumbar spine without significant disc height loss.  T11-12: Mild broad-based disc bulge. No foraminal or central canal stenosis.  T12-L1: Mild broad-based disc bulge flattening the ventral thecal sac. No evidence of neural foraminal stenosis. No central canal stenosis.  L1-L2: No significant disc bulge. No evidence of neural foraminal stenosis. No central canal stenosis.  L2-L3: Mild broad-based disc bulge. Moderate bilateral facet arthropathy. Moderate spinal stenosis. Mild bilateral foraminal narrowing.  L3-L4: Broad-based disc bulge. Mild bilateral facet arthropathy. Mild bilateral foraminal narrowing. No central canal stenosis.  L4-L5: Mild broad-based disc bulge. No evidence of neural foraminal stenosis. No central canal stenosis. Mild bilateral facet arthropathy.  L5-S1: No significant disc bulge. No evidence of neural foraminal stenosis. No central canal stenosis.  IMPRESSION: 1. Mild lumbar spine spondylosis as described above. 2. At L2-3 there is a mild broad-based disc bulge. Moderate bilateral facet arthropathy. Moderate spinal stenosis. Mild bilateral foraminal narrowing.   Electronically Signed   By: Kathreen Devoid   On: 09/12/2016 12:35    Lumbar MR wo contrast: No results found for this or any previous visit. Lumbar MR w/wo contrast: No results found for this or any previous visit.  Lumbar MR w contrast: No results found for this or any previous visit. Lumbar CT wo contrast:  Results for orders placed in visit on 06/19/09  CT Lumbar Spine Wo Contrast   Narrative **** PRIOR REPORT IMPORTED FROM AN EXTERNAL SYSTEM ****   PRIOR REPORT IMPORTED FROM THE [employer] WORKFLOW SYSTEM   REASON FOR EXAM:    severe back pain L5-S1 area.  hx of breast CA,  RME 5  COMMENTS:   LMP: tubal lig.   PROCEDURE:     CT  - CT LUMBAR SPINE WO  - Jun 19 2009  4:55PM   RESULT:     CT lumbar spine dated 06/19/2009.   Technique: Multiplanar imaging of the  lumbar spine was obtained utilizing  helical 3 mm acquisition bone as well as soft tissue algorithm  reconstruction.   Findings: There is no evidence of fracture, dislocation or malalignment. A  sclerotic density is partially visualized lungs. Endplate of S49 centrally  within the vertebral body. There is a nonspecific finding. Sclerotic  density  is also identified along the posterior Dermalon the inferior endplate  posteriorly of T12. A Schmorl's node identified along superior endplate of  L1. Multilevel mild peripheral osteophytosis identified within the lumbar  spine. A broad-based disc bulge is appreciated at the L to 3 disc base  level  cause contributing to moderate thecal sac narrowing. There is no CT  evidence  of soft tissue masses.   IMPRESSION:      No CT evidence of acute fracture. There are areas of  sclerotic densities within the T12 as described above which are  nonspecific.  Clinically warranted further evaluation with nuclear medicine bone  scanning  is recommended or possibly total body PET/CT.       Lumbar CT w/wo contrast: No results found for this or any previous visit. Lumbar CT w/wo contrast: No results found for this or any previous visit. Lumbar CT w contrast: No results found for this or any previous visit. Lumbar DG 1V: No results found for this or any previous visit. Lumbar DG 1V (Clearing): No results found for this or any previous visit. Lumbar DG 2-3V (Clearing): No results found for this or any previous visit. Lumbar DG 2-3 views: No results found for this or any previous visit. Lumbar DG (Complete) 4+V: No results found for this or any previous visit. Lumbar DG F/E views: No results found for this or any previous visit. Lumbar DG Bending views: No results found for this or any previous visit. Lumbar DG Myelogram views: No results found for this or any previous visit. Lumbar DG Myelogram: No results found for this or any previous visit. Lumbar DG  Myelogram: No results found for this or any previous visit. Lumbar DG Myelogram: No results found for this or any previous visit. Lumbar DG Myelogram Lumbosacral: No results found for this or any previous visit. Lumbar DG Diskogram views: No results found for this or any previous visit. Lumbar DG Diskogram views: No results found for this or any previous visit. Lumbar DG Epidurogram OP: No results found for this or any previous visit. Lumbar DG Epidurogram IP: No results found for this or any previous visit.  Sacroiliac Joint Imaging: Sacroiliac Joint DG: No results found for this or any previous visit. Sacroiliac Joint MR w/wo contrast: No results found for this or any previous visit. Sacroiliac Joint MR wo contrast: No results found for this or any previous visit.  Spine Imaging: Whole Spine DG Myelogram views:  No results found for this or any previous visit. Whole Spine MR Mets screen: No results found for this or any previous visit. Whole Spine MR Mets screen: No results found for this or any previous visit. Whole Spine MR w/wo: No results found for this or any previous visit. MRA Spinal Canal w/ cm: No results found for this or any previous visit. MRA Spinal Canal wo/ cm: No results found for this or any previous visit. MRA Spinal Canal w/wo cm: No results found for this or any previous visit. Spine Outside MR Films: No results found for this or any previous visit. Spine Outside CT Films: No results found for this or any previous visit. CT-Guided Biopsy: No results found for this or any previous visit. CT-Guided Needle Placement: No results found for this or any previous visit. DG Spine outside: No results found for this or any previous visit. IR Spine outside: No results found for this or any previous visit. NM Spine outside: No results found for this or any previous visit.  Hip Imaging: Hip-R MR w contrast: No results found for this or any previous visit. Hip-L MR w contrast: No  results found for this or any previous visit. Hip-R MR w/wo contrast: No results found for this or any previous visit. Hip-L MR w/wo contrast: No results found for this or any previous visit. Hip-R MR wo contrast: No results found for this or any previous visit. Hip-L MR wo contrast: No results found for this or any previous visit. Hip-R CT w contrast: No results found for this or any previous visit. Hip-L CT w contrast: No results found for this or any previous visit. Hip-R CT w/wo contrast: No results found for this or any previous visit. Hip-L CT w/wo contrast: No results found for this or any previous visit. Hip-R CT wo contrast: No results found for this or any previous visit. Hip-L CT wo contrast: No results found for this or any previous visit. Hip-R DG 2-3 views: No results found for this or any previous visit. Hip-L DG 2-3 views: No results found for this or any previous visit. Hip-R DG Arthrogram: No results found for this or any previous visit. Hip-L DG Arthrogram: No results found for this or any previous visit. Hip-B DG Bilateral: No results found for this or any previous visit.  Knee Imaging: Knee-R MR w contrast: No results found for this or any previous visit. Knee-L MR w contrast: No results found for this or any previous visit. Knee-R MR w/wo contrast: No results found for this or any previous visit. Knee-L MR w/wo contrast: No results found for this or any previous visit. Knee-R MR wo contrast: No results found for this or any previous visit. Knee-L MR wo contrast: No results found for this or any previous visit. Knee-R CT w contrast:  Results for orders placed during the hospital encounter of 04/29/15  CT Knee Right W Contrast   Narrative CLINICAL DATA:  Acute onset of right knee pain. Unable to bear weight. Concern for underlying hardware infection. Initial encounter.  EXAM: CT OF THE RIGHT KNEE WITH CONTRAST  TECHNIQUE: Multidetector CT imaging was performed  following the standard protocol during bolus administration of intravenous contrast.  COMPARISON:  Right knee radiographs performed earlier today at 2:30 p.m.  FINDINGS: There appears to be mild focal lucency about the distal aspect of the patient's intramedullary rod, nonspecific in appearance. There is suspicion of mild periosteal apposition along the medial aspect of the proximal portion of  the fracture. Underlying infection cannot be excluded. Would correlate with the patient's symptoms and lab values, and consider three-phase bone scan for further evaluation as deemed clinically appropriate.  The fracture through the distal femoral diaphysis is still evident, with some degree of healing response. Associated screws are grossly unremarkable in appearance. No significant knee joint effusion is seen. Mild postoperative soft tissue inflammation is noted about the distal femur. No abscess is seen. The visualized vasculature is grossly unremarkable.  IMPRESSION: 1. Mild apparent periosteal apposition along the medial aspect of the proximal portion of the fracture. Nonspecific mild focal lucency about the distal aspect of the intramedullary rod. Given clinical concern, underlying infection cannot be excluded, though not definitely characterized on CT. Would correlate with the patient's symptoms and lab values (CRP, etc.), and consider three-phase bone scan for further evaluation as deemed clinically appropriate. 2. Right distal femoral fracture remains evident, with some degree of healing response.   Electronically Signed   By: Garald Balding M.D.   On: 04/29/2015 18:26    Knee-L CT w contrast: No results found for this or any previous visit. Knee-R CT w/wo contrast: No results found for this or any previous visit. Knee-L CT w/wo contrast: No results found for this or any previous visit. Knee-R CT wo contrast: No results found for this or any previous visit. Knee-L CT wo  contrast: No results found for this or any previous visit. Knee-R DG 1-2 views: No results found for this or any previous visit. Knee-L DG 1-2 views: No results found for this or any previous visit. Knee-R DG 3 views: No results found for this or any previous visit. Knee-L DG 3 views: No results found for this or any previous visit. Knee-R DG 4 views:  Results for orders placed during the hospital encounter of 04/29/15  DG Knee Complete 4 Views Right   Narrative CLINICAL DATA:  Knee pain.  Fell 2 weeks ago.  EXAM: RIGHT KNEE - COMPLETE 4+ VIEW  COMPARISON:  01/12/2015, 03/17/2015  FINDINGS: Locking IM rod extends into the distal femur. 2 screws across the rod. There is lucency around the screws. There is a cerclage wire around the rod.  There is periosteal reaction around the distal femur including the more proximal screw. This may be related to healing of the distal femoral fracture however infection of the hardware cannot be excluded. Progression of periosteal reaction and lucency around the hardware since the study of 03/17/2015.  No new fracture.  IMPRESSION: Locking intra medullary rod distal femur with periosteal reaction around the hardware and lucency around the distal screws. This may be due to healing versus hardware infection   Electronically Signed   By: Franchot Gallo M.D.   On: 04/29/2015 15:10    Knee-L DG 4 views: No results found for this or any previous visit. Knee-R DG Arthrogram: No results found for this or any previous visit. Knee-L DG Arthrogram: No results found for this or any previous visit.  Note: Available results from prior imaging studies were reviewed.        ROS  Cardiovascular History: {Hx; Cardiovascular History:210120525} Pulmonary or Respiratory History: {Hx; Pumonary and/or Respiratory History:210120523} Neurological History: {Hx; Neurological:210120504} Review of Past Neurological Studies: No results found for this or any previous  visit. Psychological-Psychiatric History: {Hx; Psychological-Psychiatric History:210120512} Gastrointestinal History: {Hx; Gastrointestinal:210120527} Genitourinary History: {Hx; Genitourinary:210120506} Hematological History: {Hx; Hematological:210120510} Endocrine History: {Hx; Endocrine history:210120509} Rheumatologic History: {Hx; Rheumatological:210120530} Musculoskeletal History: {Hx; Musculoskeletal:210120528} Work History: {Hx; Work history:210120514}  Allergies  Ms. Soza is allergic to tramadol and penicillins.  Laboratory Chemistry  Inflammation Markers Lab Results  Component Value Date   ESRSEDRATE 24 03/17/2015   (CRP: Acute Phase) (ESR: Chronic Phase) Renal Function Markers Lab Results  Component Value Date   BUN 12 08/10/2016   CREATININE 0.59 08/10/2016   GFRAA 117 08/10/2016   GFRNONAA 101 08/10/2016   Hepatic Function Markers Lab Results  Component Value Date   AST 13 08/10/2016   ALT 13 08/10/2016   ALBUMIN 4.1 08/10/2016   ALKPHOS 99 08/10/2016   Electrolytes Lab Results  Component Value Date   NA 143 08/10/2016   K 3.5 08/10/2016   CL 104 08/10/2016   CALCIUM 9.3 08/10/2016   Neuropathy Markers No results found for: [id number] Bone Pathology Markers Lab Results  Component Value Date   ALKPHOS 99 08/10/2016   CALCIUM 9.3 08/10/2016   Coagulation Parameters Lab Results  Component Value Date   INR 1.1 11/08/2015   LABPROT 11.2 11/08/2015   PLT 282 06/12/2016   Cardiovascular Markers Lab Results  Component Value Date   HGB 13.8 06/12/2016   HCT 41.1 06/12/2016   Note: Lab results reviewed.  PFSH  Drug: Ms. Gunnarson  reports that she uses drugs, including Marijuana. Alcohol:  reports that she drinks alcohol. Tobacco:  reports that she has been smoking Cigarettes.  She has been smoking about 0.50 packs per day. She has never used smokeless tobacco. 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 Family: family history includes Arthritis in her brother and mother; Depression in her brother and mother; Diabetes in her brother; Heart disease in her maternal aunt and mother; Hyperlipidemia in her brother and mother; Hypertension in her brother, father, and mother; Osteoporosis in her mother; Seizures in her mother and sister; Stroke in her mother and sister.  Past Surgical History:  Procedure Laterality Date  . BREAST BIOPSY Right 1983   neg  . BREAST EXCISIONAL BIOPSY Left 2010  . BREAST LUMPECTOMY Left 2010  . FEMUR FRACTURE SURGERY    . FOOT SURGERY  2000 and 2003  . INTRAMEDULLARY (IM) NAIL INTERTROCHANTERIC Right 01/12/2015   Procedure: INTRAMEDULLARY (IM) NAIL INTERTROCHANTRIC;  Surgeon: Earnestine Leys, MD;  Location: [facility] ORS;  Service: Orthopedics;  Laterality: Right;  . KNEE SURGERY Left 2004  . TUBAL LIGATION     Active Ambulatory Problems    Diagnosis Date Noted  . History of breast cancer in female 12/23/2013  . Chronic pain 12/23/2013  . Ankylosing spondylitis of lumbosacral region ([place]) 12/23/2013  . Depression 12/23/2013  . Major depression 10/27/2014  . Substance induced mood disorder ([place]) 10/27/2014  . Fracture of shaft of femur ([place]) 01/12/2015  . Anxiety 01/12/2015  . Hypokalemia 01/12/2015  . Alcohol abuse 08/03/2015  . Cocaine abuse 08/18/2015  . Anxiety and depression 12/24/2014  . Chronic bilateral low back pain with bilateral sciatica 12/24/2014  . Chronic pain in right foot 10/20/2014  . Chronic pain syndrome 05/15/2014  . Chronic use of opiate drugs therapeutic purposes 05/19/2015  . Generalized anxiety disorder 05/04/2014  . Hepatitis C 03/12/2014  .  Insomnia 06/05/2014  . Leg reflex sympathetic dystrophy, right 05/19/2015  . Malignant neoplasm of left female breast ([place]) 09/12/2015  . Midline low  back pain without sciatica 03/12/2014  . Osteoarthritis 10/05/2011  . Raynaud's phenomenon without gangrene 05/15/2014  . Severe major depression without psychotic features ([place]) 09/12/2015  . Spinal stenosis of lumbar region 05/15/2014  . 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 Hypertension   . Ankylosing spondylitis ([place])   . OA (osteoarthritis)   . Osteoporosis   . Acute bronchitis 10/20/2014  . Shoulder joint pain 09/18/2016   Resolved Ambulatory Problems    Diagnosis Date Noted  . No Resolved Ambulatory Problems   Past Medical History:  Diagnosis Date  . Ankylosing spondylitis ([place])   . Anxiety   . Arthritis   . Blood in stool   . Breast cancer ([place]) 2010  . Chronic low back pain   . Chronic pain   . Depression   . Headache   . Hepatitis C 2007  . Hip fracture ([place])   . History of blood transfusion   . Hypertension   . Insomnia   . OA (osteoarthritis)   . Osteoporosis   . Spinal stenosis of lumbar region   . 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 In no apparent acute distress There were no vitals filed for this visit. BMI Assessment: Estimated body mass index is 29.93 kg/m as calculated from the following:   Height as of 08/10/16: 5' 1.5" (1.562 m).   Weight as of 08/10/16: 161 lb (73 kg).  BMI interpretation table: BMI level Category Range association with higher incidence of chronic pain  <18 kg/m2 Underweight   18.5-24.9 kg/m2 Ideal body weight   25-29.9 kg/m2 Overweight Increased incidence by 20%  30-34.9 kg/m2 Obese (Class I) Increased incidence by 68%  35-39.9 kg/m2 Severe obesity (Class II) Increased incidence by 136%  >40 kg/m2 Extreme obesity (Class III) Increased incidence by 254%   BMI Readings from Last 4 Encounters:   08/10/16 29.93 kg/m  06/26/16 29.10 kg/m  06/12/16 29.10 kg/m  03/07/16 28.99 kg/m   Wt Readings from Last 4 Encounters:  08/10/16 161 lb (73 kg)  06/26/16 154 lb (69.9 kg)  06/12/16 154 lb (69.9 kg)  03/07/16 153 lb 7 oz (69.6 kg)  Psych/Mental status: Alert, oriented x 3 (person, place, & time)       Eyes: PERLA Respiratory: No evidence of acute respiratory distress  Cervical Spine Exam  Inspection: No masses, redness, or swelling Alignment: Symmetrical Functional ROM: Unrestricted ROM      Stability: No instability detected Muscle strength & Tone: Functionally intact Sensory: Unimpaired Palpation: No palpable anomalies              Upper Extremity (UE) Exam    Side: Right upper extremity  Side: Left upper extremity  Inspection: No masses, redness, swelling, or asymmetry. No contractures  Inspection: No masses, redness, swelling, or asymmetry. No contractures  Functional ROM: Unrestricted ROM          Functional ROM: Unrestricted ROM          Muscle strength & Tone: Functionally intact  Muscle strength & Tone: Functionally intact  Sensory: Unimpaired  Sensory: Unimpaired  Palpation: No palpable anomalies              Palpation: No palpable anomalies              Specialized Test(s): Deferred         Specialized Test(s): Deferred          Thoracic Spine Exam  Inspection: No masses, redness,  or swelling Alignment: Symmetrical Functional ROM: Unrestricted ROM Stability: No instability detected Sensory: Unimpaired Muscle strength & Tone: No palpable anomalies  Lumbar Spine Exam  Inspection: No masses, redness, or swelling Alignment: Symmetrical Functional ROM: Unrestricted ROM      Stability: No instability detected Muscle strength & Tone: Functionally intact Sensory: Unimpaired Palpation: No palpable anomalies       Provocative Tests: Lumbar Hyperextension and rotation test: evaluation deferred today       Patrick's Maneuver: evaluation deferred today                     Gait & Posture Assessment  Ambulation: Unassisted Gait: Relatively normal for age and body habitus Posture: WNL   Lower Extremity Exam    Side: Right lower extremity  Side: Left lower extremity  Inspection: No masses, redness, swelling, or asymmetry. No contractures  Inspection: No masses, redness, swelling, or asymmetry. No contractures  Functional ROM: Unrestricted ROM          Functional ROM: Unrestricted ROM          Muscle strength & Tone: Functionally intact  Muscle strength & Tone: Functionally intact  Sensory: Unimpaired  Sensory: Unimpaired  Palpation: No palpable anomalies  Palpation: No palpable anomalies   Assessment  Primary Diagnosis & Pertinent Problem List: There were no encounter diagnoses.  Visit Diagnosis: No diagnosis found. Plan of Care  Initial treatment plan:  Please be advised that as per protocol, today's visit has been an evaluation only. We have not taken over the patient's controlled substance management.  Problem-specific plan: No problem-specific Assessment & Plan notes found for this encounter.  Ordered Lab-work, Procedure(s), Referral(s), & Consult(s): No orders of the defined types were placed in this encounter.  Pharmacotherapy: Medications ordered:  No orders of the defined types were placed in this encounter.  Medications administered during this visit: Ms. Winkles had no medications administered during this visit.   Pharmacotherapy under consideration:  Opioid Analgesics: The patient was informed that there is no guarantee that she would be a candidate for opioid analgesics. The decision will be made following CDC guidelines. This decision will be based on the results of diagnostic studies, as well as Ms. Dugger's risk profile.  Membrane stabilizer: To be determined at a later time Muscle relaxant: To be determined at a later time NSAID: To be determined at a later time Other analgesic(s): To be determined at a later time    Interventional therapies under consideration: Ms. Daudelin was informed that there is no guarantee that she would be a candidate for interventional therapies. The decision will be based on the results of diagnostic studies, as well as Ms. Garnett's risk profile.  Possible procedure(s): ***   Provider-requested follow-up: No Follow-up on file.  Future Appointments Date Time Provider [place]  09/18/2016 1:00 PM Vevelyn Francois, NP [facility] None  03/07/2017 10:15 AM [facility] None  03/07/2017 10:30 AM Grayland Ormond, Kathlene November, MD [facility] None    Primary Care Physician: Nyra Capes Location: [facility] Facility Note by:  Date: 09/18/2016; Time: 11:50 AM  Pain Score Disclaimer: We use the NRS-11 scale. This is a self-reported, subjective measurement of pain severity with only modest accuracy. It is used primarily to identify changes within a particular patient. It must be understood that outpatient pain scales are significantly less accurate that those used for research, where they can be applied under ideal controlled circumstances with minimal exposure to variables. In reality,  the score is likely to be a combination of pain intensity and pain affect, where pain affect describes the degree of emotional arousal or changes in action readiness caused by the sensory experience of pain. Factors such as social and work situation, setting, emotional state, anxiety levels, expectation, and prior pain experience may influence pain perception and show large inter-individual differences that may also be affected by time variables.  Patient instructions provided during this appointment: There are no Patient Instructions on file for this visit.

## 2016-09-20 DIAGNOSIS — M51369 Other intervertebral disc degeneration, lumbar region without mention of lumbar back pain or lower extremity pain: Secondary | ICD-10-CM | POA: Insufficient documentation

## 2016-09-20 DIAGNOSIS — M5136 Other intervertebral disc degeneration, lumbar region: Secondary | ICD-10-CM | POA: Insufficient documentation

## 2016-09-25 ENCOUNTER — Other Ambulatory Visit: Payer: Self-pay | Admitting: Family Medicine

## 2016-09-25 NOTE — Telephone Encounter (Signed)
Your patient 

## 2016-09-29 ENCOUNTER — Encounter: Payer: Self-pay | Admitting: Family Medicine

## 2016-10-02 ENCOUNTER — Telehealth: Payer: Self-pay | Admitting: Gastroenterology

## 2016-10-02 NOTE — Telephone Encounter (Signed)
Thank you :)

## 2016-10-02 NOTE — Telephone Encounter (Signed)
Patient called to cancel procedure and will call back after the first of the year to reschedule.  I called [facility]

## 2016-10-05 ENCOUNTER — Ambulatory Visit: Payer: Medicare Other | Admitting: Family Medicine

## 2016-10-09 ENCOUNTER — Ambulatory Visit (INDEPENDENT_AMBULATORY_CARE_PROVIDER_SITE_OTHER): Payer: Medicare Other | Admitting: Family Medicine

## 2016-10-09 ENCOUNTER — Ambulatory Visit: Admission: RE | Admit: 2016-10-09 | Payer: Medicare Other | Source: Ambulatory Visit | Admitting: Gastroenterology

## 2016-10-09 ENCOUNTER — Encounter: Payer: Self-pay | Admitting: Family Medicine

## 2016-10-09 ENCOUNTER — Encounter: Admission: RE | Payer: Self-pay | Source: Ambulatory Visit

## 2016-10-09 VITALS — BP 138/72 | HR 62 | Temp 98.4°F | Ht 63.0 in | Wt 179.7 lb

## 2016-10-09 DIAGNOSIS — R635 Abnormal weight gain: Secondary | ICD-10-CM

## 2016-10-09 DIAGNOSIS — H6123 Impacted cerumen, bilateral: Secondary | ICD-10-CM | POA: Diagnosis not present

## 2016-10-09 SURGERY — COLONOSCOPY WITH PROPOFOL
Anesthesia: Choice

## 2016-10-09 MED ORDER — HYDROCHLOROTHIAZIDE 25 MG PO TABS: 25.0000 mg | ORAL_TABLET | Freq: Every day | ORAL | 1 refills | Status: DC

## 2016-10-09 NOTE — Progress Notes (Signed)
BP 138/72 (BP Location: Left Arm, Patient Position: Sitting, Cuff Size: Normal)   Pulse 62   Temp 98.4 F (36.9 C)   Ht 5\' 3"  (1.6 m)   Wt 179 lb 11.2 oz (81.5 kg)   BMI 31.83 kg/m    Subjective:    Patient ID: Janet Zuniga, female    DOB: July 06, 1955, 61 y.o.   MRN: [id number]  HPI: Janet Zuniga is a 61 y.o. female  Chief Complaint  Patient presents with  . Ear Problem    x's 1 week. Says her ears are popping and feels like she has wax build up in her ears.  . Hearing Loss  . Weight Gain    Patient says she's gainging weight and doesn't know why, she doesn't eat much.    Patient presents with 1 week of ear fullness and pressure b/l. Has had frequent hx of cerumen impaction. Started using OTC wax drops a few days ago, so far no improvement. Denies congestion, ear pain, significant hearing changes, fevers.   Also concerned about weight gain. Has not changed diet much that she is aware of, and medications are the same. Does note when asked that she's much less active than she used to be, mostly from joint pains/back pain. Wondering about medications to help lose weight.   Past Medical History:  Diagnosis Date  . Ankylosing spondylitis ([place])   . Anxiety   . Arthritis   . Blood in stool   . Breast cancer ([place]) 2010   Left breast- chemo/radiation  . Chronic low back pain   . Chronic pain   . Depression   . Headache   . Hepatitis C 2007  . Hip fracture ([place])   . History of blood transfusion   . Hypertension   . Insomnia   . OA (osteoarthritis)   . Osteoporosis   . Spinal stenosis of lumbar region   . Spondyloarthritis    Social History   Social History  . Marital status: Legally Separated    Spouse name: Janet Zuniga  . Number of children: Janet Zuniga  . Years of education: Janet Zuniga   Occupational History  . Not on file.   Social History Main Topics  . Smoking status: Current Every Day Smoker    Packs/day: 0.50    Types: Cigarettes  . Smokeless tobacco: Never Used  . Alcohol  use Yes     Comment: occasionally a glass of wine  . Drug use: Yes    Types: Marijuana  . Sexual activity: Not on file   Other Topics Concern  . Not on file   Social History Narrative  . No narrative on file   Relevant past medical, surgical, family and social history reviewed and updated as indicated. Interim medical history since our last visit reviewed. Allergies and medications reviewed and updated.  Review of Systems  Constitutional: Positive for unexpected weight change.  HENT: Positive for ear pain (pressure). Hearing loss: muffled.   Respiratory: Negative.   Cardiovascular: Negative.   Gastrointestinal: Negative.   Genitourinary: Negative.   Musculoskeletal: Negative.   Neurological: Negative.   Psychiatric/Behavioral: Negative.     Per HPI unless specifically indicated above     Objective:    BP 138/72 (BP Location: Left Arm, Patient Position: Sitting, Cuff Size: Normal)   Pulse 62   Temp 98.4 F (36.9 C)   Ht 5\' 3"  (1.6 m)   Wt 179 lb 11.2 oz (81.5 kg)   BMI 31.83 kg/m   Wt  Readings from Last 3 Encounters:  10/09/16 179 lb 11.2 oz (81.5 kg)  08/10/16 161 lb (73 kg)  06/26/16 154 lb (69.9 kg)    Physical Exam  Constitutional: She is oriented to person, place, and time. She appears well-developed and well-nourished. No distress.  HENT:  Head: Atraumatic.  Nose: Nose normal.  Mouth/Throat: Oropharynx is clear and moist. No oropharyngeal exudate.  B/l EACs with deep, hardened cerumen impaction against TMs  Eyes: Pupils are equal, round, and reactive to light. Conjunctivae are normal. No scleral icterus.  Neck: Normal range of motion. Neck supple.  Cardiovascular: Normal rate and normal heart sounds.   Pulmonary/Chest: Effort normal and breath sounds normal. No respiratory distress.  Musculoskeletal: Normal range of motion.  Neurological: She is alert and oriented to person, place, and time.  Skin: Skin is warm and dry.  Psychiatric: She has a normal  mood and affect. Her behavior is normal.  Nursing note and vitals reviewed.   Procedure Note: B/l ear lavage Procedure explained and pt questions answered adequately. Both ears gently lavaged using water pick with warm water. No progress was made after multiple attempts, and wax too far back against TM to use curette. Procedure well tolerated with no complications noted. Discussed with pt to keep doing daily or twice daily use of OTC drops and using warm shower water to rinse daily. Come back for re-try in several weeks if still no improvement.      Assessment & Plan:   Problem List Items Addressed This Visit    None    Visit Diagnoses    Bilateral impacted cerumen    -  Primary   No improvement with lavage today. Continue OTC drops consistently x several weeks, f/u for retry if no improvement at that point. Return precautions reviewed   Weight gain       Long discussion about lifestyle modifications, low carb/antiimflammatory diet, low impact exercises. Will try these types of things before exploring medications       Follow up plan: Return in about 4 weeks (around 11/06/2016) for BP f/u, ear f/u.

## 2016-10-10 ENCOUNTER — Ambulatory Visit: Payer: Medicare Other

## 2016-10-11 NOTE — Patient Instructions (Signed)
Follow up in 1 month   

## 2016-10-18 ENCOUNTER — Telehealth: Payer: Self-pay | Admitting: Family Medicine

## 2016-10-18 DIAGNOSIS — H9209 Otalgia, unspecified ear: Secondary | ICD-10-CM

## 2016-10-18 NOTE — Telephone Encounter (Signed)
Routing to provider. Do you mind putting in a referral?

## 2016-10-18 NOTE — Telephone Encounter (Signed)
Patient is still having issues with her ears so she is requesting to be sent to Dr Pryor Ochoa at [facility].  Thanks

## 2016-10-19 NOTE — Telephone Encounter (Signed)
Referral generated

## 2016-10-20 ENCOUNTER — Telehealth: Payer: Self-pay | Admitting: Family Medicine

## 2016-10-20 NOTE — Telephone Encounter (Signed)
Patient would like to have a referral to the rheumatologist in [facility] # [phone]. She did not give a physicians name or ph # only Fax #  She also is requesting something for pain until she gets an appointment. She states she is having a very hard time with mobility.  Thanks

## 2016-10-20 NOTE — Telephone Encounter (Signed)
Left message to call.

## 2016-10-23 ENCOUNTER — Telehealth: Payer: Self-pay | Admitting: Family Medicine

## 2016-10-23 NOTE — Telephone Encounter (Signed)
Spoke with patient.  She does not want to go to [facility] after all, said it's too far for her to go.  She is begging for something to help with her low back and leg  pain. She states Rheum won't give her anything and she doesn't see the pain clinic til the beginning of Dec. She says Tramadol makes her sick. She said she's out of the IBU 800mg  but that it did help a little bit.

## 2016-10-23 NOTE — Telephone Encounter (Signed)
Copied from [place] #[id number]. Topic: Inquiry >> Oct 23, 2016  3:12 PM Patrice Paradise wrote: Reason for CRM: Pt would like a call back about switching her Prozac medication.

## 2016-10-23 NOTE — Telephone Encounter (Signed)
She says that she's been on the Prozac so long and it's not working anymore. Wants it changed to something else. She has appt on 11/06/16.

## 2016-10-23 NOTE — Telephone Encounter (Signed)
Left message to call.

## 2016-10-24 MED ORDER — IBUPROFEN 800 MG PO TABS: 800.0000 mg | ORAL_TABLET | Freq: Three times a day (TID) | ORAL | 1 refills | Status: DC | PRN

## 2016-10-24 NOTE — Addendum Note (Signed)
Addended by: Merrie Roof E on: 10/24/2016 08:10 AM   Modules accepted: Orders

## 2016-10-24 NOTE — Telephone Encounter (Signed)
Patient had 2 appointments in August and September.  Patient canceled both of those appointments.  Patient can call back and reschedule her appointment.

## 2016-10-24 NOTE — Telephone Encounter (Signed)
I thought she was all set up to establish with [facility] to be managing her poorly controlled Depression and Anxiety - please call patient and see what's going on with this appointment

## 2016-10-24 NOTE — Telephone Encounter (Signed)
Please call her and let her know that she only gets one more chance to see [facility] since she's cancelled twice already and given her poor control on 3 medications currently I feel the specialist is the best next step. She can discuss changing her medications once she sees them.

## 2016-10-24 NOTE — Telephone Encounter (Signed)
Patient notified that Ibuprofen was called in for pain.

## 2016-10-24 NOTE — Telephone Encounter (Signed)
I spoke with patient. She states she will not go to [place] because she doesn't have transportation.  She said she had an appt with [facility] but she cancelled and had called to reschedule. Per [facility], she no showed her appt with them on 09/18/16 and has not called to reschedule. I advised her that she must call [facility] back and reschedule her appt because they will have to evaluate her for any meds changes. She states she will just stay on the Prozac for now.

## 2016-10-24 NOTE — Telephone Encounter (Signed)
Janet Zuniga, can you check the status of her psych referral?

## 2016-10-24 NOTE — Telephone Encounter (Signed)
Also sent over 800 mg ibuprofen for her to get her through until she can see pain management

## 2016-11-01 ENCOUNTER — Telehealth: Payer: Self-pay | Admitting: Family Medicine

## 2016-11-01 NOTE — Telephone Encounter (Signed)
Copied from [place] [id number]. Topic: Appointment Scheduling - Scheduling Inquiry for Clinic >> Nov 01, 2016  1:18 PM Neva Seat wrote: Medicare Wellness Visit Nov. 12 needs to be rescheduled  to Dec. 3.  [facility] transportation can only take her to office visit on Dec. 3.    Error in scheduling.  Called pt back to reschedule - no answer.  Left voicemail for pt to call back.

## 2016-11-06 ENCOUNTER — Encounter: Payer: Self-pay | Admitting: Podiatry

## 2016-11-06 ENCOUNTER — Ambulatory Visit: Payer: Self-pay | Admitting: Podiatry

## 2016-11-06 ENCOUNTER — Ambulatory Visit: Payer: Medicare Other | Admitting: Family Medicine

## 2016-11-06 ENCOUNTER — Ambulatory Visit: Payer: Medicare Other | Admitting: Podiatry

## 2016-11-06 DIAGNOSIS — M216X1 Other acquired deformities of right foot: Secondary | ICD-10-CM

## 2016-11-06 DIAGNOSIS — Q828 Other specified congenital malformations of skin: Secondary | ICD-10-CM | POA: Diagnosis not present

## 2016-11-06 NOTE — Progress Notes (Signed)
   Subjective:    Patient ID: Janet Zuniga, female    DOB: 1955/08/17, 61 y.o.   MRN: [id number]  HPIthis patient presents the office with chief complaint of a painful callus on the bottom of her right foot.  She says this callus has been present for about 3 years, but the last 6-8 months. It has become extremely painful.  She says that she has used over-the-counter creams and   medicines to help to relieve her pain, but the problem persists.  She presents to the office for evaluation and treatment of this painful callus.    Review of Systems  All other systems reviewed and are negative.      Objective:   Physical Exam General Appearance  Alert, conversant and in no acute stress.  Vascular  Dorsalis pedis and posterior pulses are palpable  bilaterally.  Capillary return is within normal limits  Bilaterally. Temperature is within normal limits  Bilaterally  Neurologic  Senn-Weinstein monofilament wire test within normal limits  bilaterally. Muscle power  Within normal limits bilaterally.  Nails normal nails noted with no evidence of bacterial or fungal infection  Orthopedic  No limitations of motion of motion feet bilaterally.  No crepitus or effusions noted.  No bony pathology or digital deformities noted.  Skin  normotropic skin  noted bilaterally.  No signs of infections or ulcers noted.  Porokeratosis noted sub-3 of the right foot.        Assessment & Plan:  Porokeratosis sub 3 right foot.  IE  Debridement of porokeratosis right forefoot. Padding right foot.    Discussed this condition with this patient.  Explained to the patient that the bone under the callus is the causative factor for the callus.  Told this patient. She needs to be evaluated by Dr. Milinda Pointer for possible surgery to eliminate her porokeratosis.

## 2016-11-07 ENCOUNTER — Other Ambulatory Visit: Payer: Self-pay | Admitting: Family Medicine

## 2016-11-07 NOTE — Telephone Encounter (Signed)
Refill request for Flexeril 

## 2016-11-07 NOTE — Telephone Encounter (Signed)
Routing to provider. No follow up on file. 

## 2016-11-13 ENCOUNTER — Ambulatory Visit: Payer: Medicare Other

## 2016-11-22 ENCOUNTER — Telehealth: Payer: Self-pay

## 2016-11-22 DIAGNOSIS — Z748 Other problems related to care provider dependency: Secondary | ICD-10-CM

## 2016-11-22 NOTE — Telephone Encounter (Signed)
Called to rescheduled [facility]. Patient cancelled due to no transportation. Rescheduled patients appointment and will put referral in to [facility] care team for transportation help. Pt understands she will receive a phone call to talk about her options.

## 2016-12-06 ENCOUNTER — Ambulatory Visit (INDEPENDENT_AMBULATORY_CARE_PROVIDER_SITE_OTHER): Payer: Medicare Other | Admitting: Podiatry

## 2016-12-06 ENCOUNTER — Encounter: Payer: Self-pay | Admitting: Podiatry

## 2016-12-06 DIAGNOSIS — M216X1 Other acquired deformities of right foot: Secondary | ICD-10-CM

## 2016-12-06 DIAGNOSIS — Q828 Other specified congenital malformations of skin: Secondary | ICD-10-CM | POA: Diagnosis not present

## 2016-12-06 NOTE — Progress Notes (Signed)
She presents today chief complaint of a painful callus to the plantar aspect of the forefoot right.  She has a history of ankylosing spondylitis degenerative joint disease and osteoporosis.  She is disabled.  She has severe pain ambulating with regular shoe gear.  Objective: Vital signs are stable alert and oriented x3 pulses are palpable right.  Severe ankylosing spondylitis or rigidity of all of her metatarsophalangeal joints of the right foot.  This is resulted in plantar flexion of the third metatarsal phalangeal joint of the right foot.  This is also resulted in reactive hyperkeratosis solitary lesion sub-third metatarsal right.  Assessment: Ankylosing spondylitis hammertoe deformities resulting in a painful plantar flexed metatarsal and porokeratotic lesion.  Plan: Discussed etiology pathology conservative versus surgical therapies.  At this point I sharply debrided the lesions to normal skin.  Placed padding and demonstrating how to place padding in the shoes.  She will follow up with Liliane Channel in the near future for an accommodative type orthotic device.  **Rick please find a inexpensive accommodative type orthotic for this patient even if we have to make something out of Plastizote to see if it will alleviate her symptoms.

## 2016-12-07 ENCOUNTER — Ambulatory Visit: Payer: Medicare Other

## 2016-12-11 ENCOUNTER — Ambulatory Visit: Payer: Medicare Other | Admitting: Nurse Practitioner

## 2017-01-03 ENCOUNTER — Other Ambulatory Visit: Payer: Medicare Other | Admitting: Orthotics

## 2017-01-05 ENCOUNTER — Ambulatory Visit: Payer: Medicare Other

## 2017-01-09 ENCOUNTER — Telehealth: Payer: Self-pay | Admitting: Family Medicine

## 2017-01-09 ENCOUNTER — Ambulatory Visit: Payer: Medicare Other | Admitting: Nurse Practitioner

## 2017-01-09 NOTE — Telephone Encounter (Signed)
Copied from [place] [phone] [phone]. Topic: General - Other >> Jan 09, 2017  1:37 PM Yvette Rack wrote: Reason for CRM: patient is calling Rosalyn Gess back about transportation

## 2017-01-09 NOTE — Telephone Encounter (Signed)
Spoke to pt she recv'd letter from Etta Grandchild in September regarding [employer] and [employer] and that she would be eligible when she turns 77 in August. Will send other chore info from 211

## 2017-01-22 ENCOUNTER — Emergency Department: Payer: Medicare Other

## 2017-01-22 ENCOUNTER — Emergency Department
Admission: EM | Admit: 2017-01-22 | Discharge: 2017-01-22 | Disposition: A | Discharge status: Home / Self Care | Payer: Medicare Other | Attending: Student in an Organized Health Care Education/Training Program | Admitting: Student in an Organized Health Care Education/Training Program

## 2017-01-22 ENCOUNTER — Encounter: Payer: Self-pay | Admitting: Medical Oncology

## 2017-01-22 DIAGNOSIS — I10 Essential (primary) hypertension: Secondary | ICD-10-CM | POA: Insufficient documentation

## 2017-01-22 DIAGNOSIS — R202 Paresthesia of skin: Secondary | ICD-10-CM | POA: Diagnosis present

## 2017-01-22 DIAGNOSIS — M79629 Pain in unspecified upper arm: Secondary | ICD-10-CM | POA: Insufficient documentation

## 2017-01-22 DIAGNOSIS — Z79899 Other long term (current) drug therapy: Secondary | ICD-10-CM | POA: Insufficient documentation

## 2017-01-22 DIAGNOSIS — F17210 Nicotine dependence, cigarettes, uncomplicated: Secondary | ICD-10-CM | POA: Diagnosis not present

## 2017-01-22 DIAGNOSIS — R0789 Other chest pain: Secondary | ICD-10-CM

## 2017-01-22 DIAGNOSIS — Z853 Personal history of malignant neoplasm of breast: Secondary | ICD-10-CM | POA: Insufficient documentation

## 2017-01-22 LAB — URINALYSIS, COMPLETE (UACMP) WITH MICROSCOPIC
Bacteria, UA: NONE SEEN
Bilirubin Urine: NEGATIVE
Glucose, UA: NEGATIVE mg/dL
Ketones, ur: NEGATIVE mg/dL
Leukocytes, UA: NEGATIVE
Nitrite: NEGATIVE
Protein, ur: NEGATIVE mg/dL
Specific Gravity, Urine: 1.005 (ref 1.005–1.030)
pH: 7 (ref 5.0–8.0)

## 2017-01-22 LAB — CBC WITH DIFFERENTIAL/PLATELET
BASOS ABS: 0.1 10*3/uL (ref 0–0.1)
Basophils Relative: 1 %
Eosinophils Absolute: 0.1 10*3/uL (ref 0–0.7)
Eosinophils Relative: 1 %
HEMATOCRIT: 37 % (ref 35.0–47.0)
Hemoglobin: 12.1 g/dL (ref 12.0–16.0)
LYMPHS ABS: 2.7 10*3/uL (ref 1.0–3.6)
LYMPHS PCT: 22 %
MCH: 28.1 pg (ref 26.0–34.0)
MCHC: 32.6 g/dL (ref 32.0–36.0)
MCV: 86.4 fL (ref 80.0–100.0)
Monocytes Absolute: 0.7 10*3/uL (ref 0.2–0.9)
Monocytes Relative: 6 %
NEUTROS ABS: 8.5 10*3/uL — AB (ref 1.4–6.5)
Neutrophils Relative %: 70 %
Platelets: 387 10*3/uL (ref 150–440)
RBC: 4.29 MIL/uL (ref 3.80–5.20)
RDW: 17.2 % — ABNORMAL HIGH (ref 11.5–14.5)
WBC: 12.1 10*3/uL — AB (ref 3.6–11.0)

## 2017-01-22 LAB — COMPREHENSIVE METABOLIC PANEL
ALK PHOS: 82 U/L (ref 38–126)
ALT: 15 U/L (ref 14–54)
AST: 24 U/L (ref 15–41)
Albumin: 3.6 g/dL (ref 3.5–5.0)
Anion gap: 12 (ref 5–15)
BUN: 14 mg/dL (ref 6–20)
CALCIUM: 8.5 mg/dL — AB (ref 8.9–10.3)
CHLORIDE: 107 mmol/L (ref 101–111)
CO2: 21 mmol/L — ABNORMAL LOW (ref 22–32)
Creatinine, Ser: 0.51 mg/dL (ref 0.44–1.00)
Glucose, Bld: 100 mg/dL — ABNORMAL HIGH (ref 65–99)
Potassium: 3.7 mmol/L (ref 3.5–5.1)
Sodium: 140 mmol/L (ref 135–145)
TOTAL PROTEIN: 7.8 g/dL (ref 6.5–8.1)
Total Bilirubin: 0.5 mg/dL (ref 0.3–1.2)

## 2017-01-22 LAB — TROPONIN I

## 2017-01-22 MED ORDER — DIAZEPAM 5 MG PO TABS: 5.0000 mg | ORAL_TABLET | Freq: Three times a day (TID) | ORAL | 0 refills | Status: DC | PRN

## 2017-01-22 MED ORDER — LORAZEPAM 1 MG PO TABS
1.0000 mg | ORAL_TABLET | Freq: Once | ORAL | Status: AC
  Administered 2017-01-22: 1 mg via ORAL
  Filled 2017-01-22: qty 1

## 2017-01-22 MED ORDER — LIDOCAINE 5 % EX PTCH: 1.0000 | MEDICATED_PATCH | Freq: Two times a day (BID) | CUTANEOUS | 0 refills | Status: DC

## 2017-01-22 NOTE — ED Notes (Signed)
No further VS as pt was sitting at the edge of the bed with all monitoring removed and dressed stating that she would like her IV out that EDP had d/c her.  No distress at this time, pt reprts 5/10 pain which she states is chronic

## 2017-01-22 NOTE — ED Notes (Signed)
Patient transported to MRI 

## 2017-01-22 NOTE — ED Provider Notes (Signed)
[facility] Emergency Department Provider Note    First MD Initiated Contact with Patient 01/22/17 1606     (approximate)  I have reviewed the triage vital signs and the nursing notes.   HISTORY  Chief Complaint Facial numbness, htn   HPI Janet Zuniga is a 62 y.o. female history of anxiety, chronic pain depression and hypertension presents to the ER with chief complaint of bilateral upper arm pain and achiness for the past 3 days as well as left facial numbness that started yesterday.  Patient states that she is stressed due to dealing with and caring for her mother.  Denies any chest pain or pressure.  No personal history of stroke.  Denies any other associated weakness.  States the pain in her arms is worsened with palpation and with movement.  Says the pain is mild in severity.  Past Medical History:  Diagnosis Date  . Ankylosing spondylitis ([place])   . Anxiety   . Arthritis   . Blood in stool   . Breast cancer ([place]) 2010   Left breast- chemo/radiation  . Chronic low back pain   . Chronic pain   . Depression   . Headache   . Hepatitis C 2007  . Hip fracture ([place])   . History of blood transfusion   . Hypertension   . Insomnia   . OA (osteoarthritis)   . Osteoporosis   . Spinal stenosis of lumbar region   . Spondyloarthritis    Family History  Problem Relation Age of Onset  . Arthritis Mother   . Stroke Mother   . Heart disease Mother   . Hypertension Mother   . Hyperlipidemia Mother   . Depression Mother   . Seizures Mother   . Osteoporosis Mother   . Hypertension Father   . Hypertension Brother   . Arthritis Brother   . Depression Brother   . Diabetes Brother   . Hyperlipidemia Brother   . Heart disease Maternal Aunt   . Seizures Sister   . Stroke Sister   . Breast cancer Neg Hx   . COPD Neg Hx   . Cancer Neg Hx    Past Surgical History:  Procedure Laterality Date  . BREAST BIOPSY Right 1983   neg  . BREAST EXCISIONAL  BIOPSY Left 2010  . BREAST LUMPECTOMY Left 2010  . FEMUR FRACTURE SURGERY    . FOOT SURGERY  2000 and 2003  . INTRAMEDULLARY (IM) NAIL INTERTROCHANTERIC Right 01/12/2015   Procedure: INTRAMEDULLARY (IM) NAIL INTERTROCHANTRIC;  Surgeon: Earnestine Leys, MD;  Location: [facility] ORS;  Service: Orthopedics;  Laterality: Right;  . KNEE SURGERY Left 2004  . TUBAL LIGATION     Patient Active Problem List   Diagnosis Date Noted  . Degenerative disc disease, lumbar 09/20/2016  . Shoulder joint pain 09/18/2016  . Hypertension   . Ankylosing spondylitis ([place])   . OA (osteoarthritis)   . Osteoporosis   . Malignant neoplasm of left female breast ([place]) 09/12/2015  . Severe major depression without psychotic features ([place]) 09/12/2015  . Cocaine abuse ([place]) 08/18/2015  . Alcohol abuse 08/03/2015  . Chronic use of opiate drugs therapeutic purposes 05/19/2015  . Leg reflex sympathetic dystrophy, right 05/19/2015  . Fracture of shaft of femur ([place]) 01/12/2015  . Anxiety 01/12/2015  . Hypokalemia 01/12/2015  . Anxiety and depression 12/24/2014  . Chronic bilateral low back pain with bilateral sciatica 12/24/2014  . Major depression 10/27/2014  . Substance induced mood disorder ([place]) 10/27/2014  .  Chronic pain in right foot 10/20/2014  . Acute bronchitis 10/20/2014  . Insomnia 06/05/2014  . Chronic pain syndrome 05/15/2014  . Raynaud's phenomenon without gangrene 05/15/2014  . Spinal stenosis of lumbar region 05/15/2014  . 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 Generalized anxiety disorder 05/04/2014  . Hepatitis C 03/12/2014  . Midline low back pain without sciatica 03/12/2014  . History of breast cancer in female 12/23/2013  . Chronic pain 12/23/2013  . Ankylosing spondylitis of lumbosacral region ([place]) 12/23/2013  . Depression 12/23/2013  . Osteoarthritis 10/05/2011      Prior to Admission medications   Medication Sig Start Date End Date Taking? Authorizing Provider  busPIRone (BUSPAR) 30 MG  tablet Take by mouth. 04/21/16 04/21/17  [provider]  cyclobenzaprine (FLEXERIL) 10 MG tablet Take by mouth. 05/09/16   [provider]  cyclobenzaprine (FLEXERIL) 10 MG tablet Take 1 tablet (10 mg total) by mouth at bedtime. 11/07/16   Volney American, PA-C  diazepam (VALIUM) 5 MG tablet Take 1 tablet (5 mg total) by mouth every 8 (eight) hours as needed for anxiety. 01/22/17 01/22/18  Merlyn Lot, MD  famotidine (PEPCID) 40 MG tablet Take 1 tablet (40 mg total) by mouth every evening. 06/12/16 06/12/17  Nance Pear, MD  FLUoxetine (PROZAC) 40 MG capsule Take by mouth. 12/24/14   [provider]  hydrochlorothiazide (HYDRODIURIL) 25 MG tablet Take 1 tablet (25 mg total) by mouth daily. 10/09/16   Volney American, PA-C  ibuprofen (ADVIL,MOTRIN) 800 MG tablet Take 1 tablet (800 mg total) by mouth 3 (three) times daily as needed. 10/24/16   Volney American, PA-C  lansoprazole (PREVACID) 15 MG capsule Take 15 mg by mouth.    [provider]  lidocaine (LIDODERM) 5 % Place 1 patch onto the skin every 12 (twelve) hours. Remove & Discard patch within 12 hours or as directed by MD 01/22/17 01/22/18  Merlyn Lot, MD  traZODone (DESYREL) 150 MG tablet Take 150 mg by mouth. 07/10/14   [provider]    Allergies Tramadol and Penicillins    Social History Social History   Tobacco Use  . Smoking status: Current Every Day Smoker    Packs/day: 0.50    Types: Cigarettes  . Smokeless tobacco: Never Used  Substance Use Topics  . Alcohol use: Yes    Comment: occasionally a glass of wine  . Drug use: Yes    Types: Marijuana    Review of Systems Patient denies headaches, rhinorrhea, blurry vision, numbness, shortness of breath, chest pain, edema, cough, abdominal pain, nausea, vomiting, diarrhea, dysuria, fevers, rashes or hallucinations unless otherwise stated above in  HPI. ____________________________________________   PHYSICAL EXAM:  VITAL SIGNS: Vitals:   01/22/17 1910 01/22/17 1915  BP: (!) 171/102   Pulse: 67   Resp: 14   Temp:  (!) 97.5 F (36.4 C)  SpO2: 97%     Constitutional: Alert and oriented.  in no acute distress. Eyes: Conjunctivae are normal.  Head: Atraumatic. Nose: No congestion/rhinnorhea. Mouth/Throat: Mucous membranes are moist.   Neck: No stridor. Painless ROM.  Cardiovascular: Normal rate, regular rhythm. Grossly normal heart sounds.  Good peripheral circulation. Respiratory: Normal respiratory effort.  No retractions. Lungs CTAB. Gastrointestinal: Soft and nontender. No distention. No abdominal bruits. No CVA tenderness. Musculoskeletal: No lower extremity tenderness nor edema.  No joint effusions. Neurologic:  Normal speech and language. No gross focal neurologic deficits are appreciated. No facial droop Skin:  Skin is warm, dry and intact. No rash noted.  Psychiatric: withdrawn and anxious appearing ____________________________________________   LABS (all labs ordered are listed, but only abnormal results are displayed)  Results for orders placed or performed during the hospital encounter of 01/22/17 (from the past 24 hour(s))  CBC with Differential/Platelet     Status: Abnormal   Collection Time: 01/22/17  4:37 PM  Result Value Ref Range   WBC 12.1 (H) 3.6 - 11.0 K/uL   RBC 4.29 3.80 - 5.20 MIL/uL   Hemoglobin 12.1 12.0 - 16.0 g/dL   HCT 37.0 35.0 - 47.0 %   MCV 86.4 80.0 - 100.0 fL   MCH 28.1 26.0 - 34.0 pg   MCHC 32.6 32.0 - 36.0 g/dL   RDW 17.2 (H) 11.5 - 14.5 %   Platelets 387 150 - 440 K/uL   Neutrophils Relative % 70 %   Neutro Abs 8.5 (H) 1.4 - 6.5 K/uL   Lymphocytes Relative 22 %   Lymphs Abs 2.7 1.0 - 3.6 K/uL   Monocytes Relative 6 %   Monocytes Absolute 0.7 0.2 - 0.9 K/uL   Eosinophils Relative 1 %   Eosinophils Absolute 0.1 0 - 0.7 K/uL   Basophils Relative 1 %   Basophils Absolute 0.1 0  - 0.1 K/uL  Comprehensive metabolic panel     Status: Abnormal   Collection Time: 01/22/17  4:37 PM  Result Value Ref Range   Sodium 140 135 - 145 mmol/L   Potassium 3.7 3.5 - 5.1 mmol/L   Chloride 107 101 - 111 mmol/L   CO2 21 (L) 22 - 32 mmol/L   Glucose, Bld 100 (H) 65 - 99 mg/dL   BUN 14 6 - 20 mg/dL   Creatinine, Ser 0.51 0.44 - 1.00 mg/dL   Calcium 8.5 (L) 8.9 - 10.3 mg/dL   Total Protein 7.8 6.5 - 8.1 g/dL   Albumin 3.6 3.5 - 5.0 g/dL   AST 24 15 - 41 U/L   ALT 15 14 - 54 U/L   Alkaline Phosphatase 82 38 - 126 U/L   Total Bilirubin 0.5 0.3 - 1.2 mg/dL   GFR calc non Af Amer >60 >60 mL/min   GFR calc Af Amer >60 >60 mL/min   Anion gap 12 5 - 15  Troponin I     Status: None   Collection Time: 01/22/17  4:37 PM  Result Value Ref Range   Troponin I <0.03 <0.03 ng/mL  Urinalysis, Complete w Microscopic     Status: Abnormal   Collection Time: 01/22/17  4:38 PM  Result Value Ref Range   Color, Urine STRAW (A) YELLOW   APPearance CLEAR (A) CLEAR   Specific Gravity, Urine 1.005 1.005 - 1.030   pH 7.0 5.0 - 8.0   Glucose, UA NEGATIVE NEGATIVE mg/dL   Hgb urine dipstick SMALL (A) NEGATIVE   Bilirubin Urine NEGATIVE NEGATIVE   Ketones, ur NEGATIVE NEGATIVE mg/dL   Protein, ur NEGATIVE NEGATIVE mg/dL   Nitrite NEGATIVE NEGATIVE   Leukocytes, UA NEGATIVE NEGATIVE   RBC / HPF 0-5 0 - 5 RBC/hpf   WBC, UA 0-5 0 - 5 WBC/hpf   Bacteria, UA NONE SEEN NONE SEEN   Squamous Epithelial / LPF 0-5 (A) NONE SEEN   ____________________________________________  EKG My review and personal interpretation at Time: 16:38   Indication: htn  Rate: 75  Rhythm: sinus Axis: normal Other: normal intervals, no stemi ____________________________________________  RADIOLOGY  I personally reviewed all radiographic images ordered to evaluate for the above acute complaints and reviewed radiology reports  and findings.  These findings were personally discussed with the patient.  Please see medical  record for radiology report.  ____________________________________________   PROCEDURES  Procedure(s) performed:  Procedures    Critical Care performed: no ____________________________________________   INITIAL IMPRESSION / ASSESSMENT AND PLAN / ED COURSE  Pertinent labs & imaging results that were available during my care of the patient were reviewed by me and considered in my medical decision making (see chart for details).  DDX: CVA, TIA, mass, hypertensive encephalopathy, ACS, CHF, musculoskeletal strain, costochondritis  JANITH NIELSON is a 62 y.o. who presents to the ED with symptoms as described above.  Patient is well-appearing in no acute distress.  Mild hypertension.  CT head shows no evidence of stroke.  No evidence of dissection.  Bilateral arm pain seems very reproducible muscular skeletal in nature.  EKG shows no acute ischemia.  Based on her symptoms will order MRI to further evaluate for evidence of TIA.  The patient will be placed on continuous pulse oximetry and telemetry for monitoring.  Laboratory evaluation will be sent to evaluate for the above complaints.     Clinical Course as of Jan 23 1944  Mon Jan 22, 2017  1941 Reassessed.  MRI and Troponin is negative.  Patient states symptoms improved after Ativan.  I do suspect some component of stress-induced symptomatology.  This point I do not identify any indication for her to be hospitalized.  Do feel patient is appropriate for outpatient follow-up.  Have discussed with the patient and available family all diagnostics and treatments performed thus far and all questions were answered to the best of my ability. The patient demonstrates understanding and agreement with plan.   [PR]    Clinical Course User Index [PR] Merlyn Lot, MD     ____________________________________________   FINAL CLINICAL IMPRESSION(S) / ED DIAGNOSES  Final diagnoses:  Acute chest wall pain  Facial tingling sensation       NEW MEDICATIONS STARTED DURING THIS VISIT:  New Prescriptions   DIAZEPAM (VALIUM) 5 MG TABLET    Take 1 tablet (5 mg total) by mouth every 8 (eight) hours as needed for anxiety.   LIDOCAINE (LIDODERM) 5 %    Place 1 patch onto the skin every 12 (twelve) hours. Remove & Discard patch within 12 hours or as directed by MD     Note:  This document was prepared using Dragon voice recognition software and may include unintentional dictation errors.    Merlyn Lot, MD 01/22/17 [phone]

## 2017-01-22 NOTE — ED Notes (Signed)
Pt is back from MRI, alert and oriented. No distress at this time

## 2017-01-22 NOTE — ED Notes (Signed)
Patient transported to CT 

## 2017-01-22 NOTE — ED Triage Notes (Signed)
Pt from home via ems with reports of bilt arm pain and weakness x 2 days. Pt also reports left sided facial numbness for more than 24 hrs. Pt a/o x 4, speech is clear.

## 2017-02-06 ENCOUNTER — Ambulatory Visit: Payer: Self-pay | Admitting: Family Medicine

## 2017-02-08 ENCOUNTER — Telehealth: Payer: Self-pay | Admitting: Family Medicine

## 2017-02-08 MED ORDER — IBUPROFEN 800 MG PO TABS: 800.0000 mg | ORAL_TABLET | Freq: Three times a day (TID) | ORAL | 0 refills | Status: DC | PRN

## 2017-02-08 MED ORDER — CYCLOBENZAPRINE HCL 10 MG PO TABS: 10.0000 mg | ORAL_TABLET | Freq: Every evening | ORAL | 0 refills | Status: DC | PRN

## 2017-02-08 NOTE — Telephone Encounter (Signed)
Called patient and notified her of her one week refill and that Apolonio Schneiders would like for her schedule a F/U appointment. I explained to patient it was due to No Showing to her appointments, and she last time it was because her mother is the nursing home. Patient asked if the ibuprofen was TID and I said yes, she asked about flexeril and I explained 1 nightly for muscle spasms, 7 quantity. Patient said "Ok, Thank you." and hung up the phone.  Routing to provider.

## 2017-02-08 NOTE — Telephone Encounter (Signed)
Copied from [place]. Topic: Quick Communication - See Telephone Encounter >> Feb 08, 2017  9:13 AM Robina Ade, Helene Kelp D wrote: CRM for notification. See Telephone encounter for: 02/08/17. Patient called and would like to see if Apolonio Schneiders can send in a rx for 800 ibuprofen and muscle relaxer. Please call patient if she can be called in, thanks.

## 2017-02-08 NOTE — Telephone Encounter (Signed)
Pt has consistently no-showed her appts with me, most recently Tuesday for this back pain and med refills. I will give her 1 week and she can come in for her follow up if she needs more.

## 2017-02-23 ENCOUNTER — Encounter: Payer: Self-pay | Admitting: Family Medicine

## 2017-03-03 NOTE — Progress Notes (Deleted)
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  INTERVAL HISTORY: Patient returns to clinic for routine yearly evaluation. She currently feels well and is asymptomatic. She continues to have chronic back and joint pain. She continues to have high anxiety and multiple social issues.  She denies any chest pain, shortness of breath, or hemoptysis. She denies any abdominal pain. She denies any nausea, vomiting, constipation, or diarrhea. She has no urinary complaints. She denies any easy bleeding or bruising.  Patient offers no further specific complaints.   REVIEW OF SYSTEMS:   Review of Systems  Constitutional: Negative for fever, malaise/fatigue and weight loss.  Respiratory: Negative.  Negative for cough and shortness of breath.   Cardiovascular: Negative.  Negative for chest pain and leg swelling.  Gastrointestinal: Negative.  Negative for abdominal pain.  Genitourinary: Negative.   Musculoskeletal: Positive for back pain and joint pain.  Neurological: Negative for weakness.  Psychiatric/Behavioral: Positive for depression. The patient is nervous/anxious.     As per HPI. Otherwise, a complete review of systems is negative.  PAST MEDICAL HISTORY: Past Medical History:  Diagnosis Date  . Ankylosing spondylitis ([place])   . Anxiety   . Arthritis   . Blood in stool   . Breast cancer ([place]) 2010   Left breast- chemo/radiation  . Chronic low back pain   . Chronic pain   . Depression   . Headache   . Hepatitis C 2007  . Hip fracture ([place])   . History of blood transfusion   . Hypertension   . Insomnia   . OA (osteoarthritis)   . Osteoporosis   . Spinal stenosis of lumbar region   . Spondyloarthritis      PAST SURGICAL HISTORY: Past Surgical History:  Procedure Laterality Date  . BREAST BIOPSY Right 1983   neg  . BREAST EXCISIONAL BIOPSY Left 2010  . BREAST LUMPECTOMY Left 2010  . FEMUR FRACTURE SURGERY    . FOOT SURGERY  2000 and 2003  . INTRAMEDULLARY (IM) NAIL INTERTROCHANTERIC Right 01/12/2015   Procedure: INTRAMEDULLARY (IM) NAIL INTERTROCHANTRIC;  Surgeon: Earnestine Leys, MD;  Location: [facility] ORS;  Service: Orthopedics;  Laterality: Right;  . KNEE SURGERY Left 2004  . TUBAL LIGATION      FAMILY HISTORY Family History  Problem Relation Age of Onset  . Arthritis Mother   . Stroke Mother   . Heart disease Mother   . Hypertension Mother   . Hyperlipidemia Mother   . Depression Mother   . Seizures Mother   . Osteoporosis Mother   . Hypertension Father   . Hypertension Brother   . Arthritis Brother   . Depression Brother   . Diabetes Brother   . Hyperlipidemia Brother   . Heart disease Maternal Aunt   . Seizures Sister   . Stroke Sister   . Breast cancer Neg Hx   . COPD Neg Hx   . Cancer Neg Hx        ADVANCED DIRECTIVES:    HEALTH MAINTENANCE: Social History   Tobacco Use  . Smoking status: Current Every Day Smoker    Packs/day: 0.50    Types: Cigarettes  . Smokeless tobacco: Never Used  Substance Use Topics  . Alcohol use: Yes    Comment: occasionally a  glass of wine  . Drug use: Yes    Types: Marijuana     Colonoscopy:  PAP:  Bone density:  Lipid panel:  Allergies  Allergen Reactions  . Tramadol Nausea Only  . Penicillins Rash and Other (See Comments)    Has patient had a PCN reaction causing immediate rash, facial/tongue/throat swelling, SOB or lightheadedness with hypotension: Yes Has patient had a PCN reaction causing severe rash involving mucus membranes or skin necrosis: No Has patient had a PCN reaction that required hospitalization No Has patient had a PCN reaction occurring within the last 10 years: No If all of the above answers  are "NO", then may proceed with Cephalosporin use.     Current Outpatient Medications  Medication Sig Dispense Refill  . busPIRone (BUSPAR) 30 MG tablet Take by mouth.    . cyclobenzaprine (FLEXERIL) 10 MG tablet Take by mouth.    . cyclobenzaprine (FLEXERIL) 10 MG tablet Take 1 tablet (10 mg total) by mouth at bedtime as needed for muscle spasms. 7 tablet 0  . diazepam (VALIUM) 5 MG tablet Take 1 tablet (5 mg total) by mouth every 8 (eight) hours as needed for anxiety. 6 tablet 0  . famotidine (PEPCID) 40 MG tablet Take 1 tablet (40 mg total) by mouth every evening. 30 tablet 1  . FLUoxetine (PROZAC) 40 MG capsule Take by mouth.    . hydrochlorothiazide (HYDRODIURIL) 25 MG tablet Take 1 tablet (25 mg total) by mouth daily. 30 tablet 1  . ibuprofen (ADVIL,MOTRIN) 800 MG tablet Take 1 tablet (800 mg total) by mouth 3 (three) times daily as needed. 21 tablet 0  . lansoprazole (PREVACID) 15 MG capsule Take 15 mg by mouth.    . lidocaine (LIDODERM) 5 % Place 1 patch onto the skin every 12 (twelve) hours. Remove & Discard patch within 12 hours or as directed by MD 10 patch 0  . traZODone (DESYREL) 150 MG tablet Take 150 mg by mouth.     No current facility-administered medications for this visit.     OBJECTIVE: There were no vitals filed for this visit.   There is no height or weight on file to calculate BMI.    ECOG FS:0 - Asymptomatic  General: Well-developed, well-nourished, no acute distress. Eyes: Pink conjunctiva, anicteric sclera Breasts: Bilateral breast and axilla without lumps or masses.  Lungs: Clear to auscultation bilaterally. Heart: Regular rate and rhythm. No rubs, murmurs, or gallops. Abdomen: Soft, nontender, nondistended. No organomegaly noted, normoactive bowel sounds. Musculoskeletal: No edema, cyanosis, or clubbing. Neuro: Alert, answering all questions appropriately. Cranial nerves grossly intact. Skin: No rashes or petechiae noted. Psych: Normal affect.  LAB  RESULTS:  Lab Results  Component Value Date   NA 140 01/22/2017   K 3.7 01/22/2017   CL 107 01/22/2017   CO2 21 (L) 01/22/2017   GLUCOSE 100 (H) 01/22/2017   BUN 14 01/22/2017   CREATININE 0.51 01/22/2017   CALCIUM 8.5 (L) 01/22/2017   PROT 7.8 01/22/2017   ALBUMIN 3.6 01/22/2017   AST 24 01/22/2017   ALT 15 01/22/2017   ALKPHOS 82 01/22/2017   BILITOT 0.5 01/22/2017   GFRNONAA >60 01/22/2017   GFRAA >60 01/22/2017    Lab Results  Component Value Date   WBC 12.1 (H) 01/22/2017   NEUTROABS 8.5 (H) 01/22/2017   HGB 12.1 01/22/2017   HCT 37.0 01/22/2017   MCV 86.4 01/22/2017   PLT 387 01/22/2017   Lab Results  Component Value Date   LABCA2 31.3  03/07/2016     STUDIES: No results found.  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  PLAN:    1.  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 Patient's most recent mammogram on May 19, 2015 was reported as BI-RADS 2. Repeat in May 2018. Patient completed all of her chemotherapy in December 2010 and her adjuvant XRT in the Spring of 2011. Return to clinic in 1 year routine evaluation.  2.  Anxiety: Patient will no longer receive Xanax from this clinic. 3.  Ankylosing spondylitis: Treatment for her primary rheumatologist.  Patient should not receive narcotics from this clinic.  4.  Hepatitis C: Patient reports this has resolved with treatment. 5.  Previous suicide attempts: No refills on Xanax or narcotics as above.  Patient expressed understanding and was in agreement with this plan. She also understands that She can call clinic at any time with any questions, concerns, or complaints.    Lloyd Huger, MD   03/03/2017 9:55 AM

## 2017-03-05 ENCOUNTER — Ambulatory Visit: Payer: Medicare Other | Admitting: Family Medicine

## 2017-03-07 ENCOUNTER — Inpatient Hospital Stay: Payer: Medicare Other | Admitting: Oncology

## 2017-03-07 ENCOUNTER — Inpatient Hospital Stay: Payer: Medicare Other

## 2017-03-13 ENCOUNTER — Telehealth: Payer: Self-pay

## 2017-03-13 NOTE — Telephone Encounter (Signed)
Called patient to see if she could come in at 1pm on 03/14/2017 for her AWV prior to her visit with Merrie Roof, [place].  Left message with [employer] call back information: [phone]

## 2017-03-14 ENCOUNTER — Ambulatory Visit: Payer: Medicare Other | Admitting: Family Medicine

## 2017-03-14 ENCOUNTER — Encounter: Payer: Self-pay | Admitting: Family Medicine

## 2017-03-14 ENCOUNTER — Other Ambulatory Visit: Payer: Self-pay | Admitting: Family Medicine

## 2017-03-14 NOTE — Telephone Encounter (Signed)
Copied from [place] [id number]. Topic: Quick Communication - Rx Refill/Question >> Mar 14, 2017  2:05 PM Waylan Rocher, Lumin L wrote: Medication: ibuprofen (ADVIL,MOTRIN) 800 MG tablet   Has the patient contacted their pharmacy? Yes.    (Agent: If no, request that the patient contact the pharmacy for the refill.)  Preferred Pharmacy (with phone number or street name): [facility] [place], [place] [address] [place] [address] Phone: [phone] Fax: [phone]  Agent: Please be advised that RX refills may take up to 3 business days. We ask that you follow-up with your pharmacy.

## 2017-03-15 ENCOUNTER — Encounter: Payer: Self-pay | Admitting: Family Medicine

## 2017-03-15 MED ORDER — IBUPROFEN 800 MG PO TABS: 800.0000 mg | ORAL_TABLET | Freq: Three times a day (TID) | ORAL | 0 refills | Status: DC | PRN

## 2017-03-15 NOTE — Telephone Encounter (Signed)
Pt. Has an appointment 03/21/17 Merrie Roof Is it ok to refill Advil? Thanks.

## 2017-03-19 ENCOUNTER — Encounter: Payer: Self-pay | Admitting: Family Medicine

## 2017-03-19 ENCOUNTER — Ambulatory Visit: Payer: Medicare Other | Admitting: Family Medicine

## 2017-03-19 VITALS — BP 150/89 | HR 75 | Temp 98.4°F | Wt 168.4 lb

## 2017-03-19 DIAGNOSIS — G8929 Other chronic pain: Secondary | ICD-10-CM

## 2017-03-19 DIAGNOSIS — J069 Acute upper respiratory infection, unspecified: Secondary | ICD-10-CM

## 2017-03-19 DIAGNOSIS — M5441 Lumbago with sciatica, right side: Secondary | ICD-10-CM | POA: Diagnosis not present

## 2017-03-19 DIAGNOSIS — F419 Anxiety disorder, unspecified: Secondary | ICD-10-CM

## 2017-03-19 DIAGNOSIS — I10 Essential (primary) hypertension: Secondary | ICD-10-CM | POA: Diagnosis not present

## 2017-03-19 DIAGNOSIS — B9789 Other viral agents as the cause of diseases classified elsewhere: Secondary | ICD-10-CM

## 2017-03-19 DIAGNOSIS — M5442 Lumbago with sciatica, left side: Secondary | ICD-10-CM | POA: Diagnosis not present

## 2017-03-19 MED ORDER — CYCLOBENZAPRINE HCL 10 MG PO TABS: 10.0000 mg | ORAL_TABLET | Freq: Three times a day (TID) | ORAL | 2 refills | Status: DC | PRN

## 2017-03-19 MED ORDER — ALPRAZOLAM 1 MG PO TABS: 1.0000 mg | ORAL_TABLET | Freq: Two times a day (BID) | ORAL | 0 refills | Status: DC | PRN

## 2017-03-19 MED ORDER — FUROSEMIDE 20 MG PO TABS: 20.0000 mg | ORAL_TABLET | Freq: Every day | ORAL | 3 refills | Status: DC

## 2017-03-19 NOTE — Progress Notes (Signed)
BP (!) 150/89 (BP Location: Left Arm, Patient Position: Sitting, Cuff Size: Large)   Pulse 75   Temp 98.4 F (36.9 C) (Tympanic)   Wt 168 lb 6.4 oz (76.4 kg)   SpO2 99%   BMI 31.82 kg/m    Subjective:    Patient ID: Janet Zuniga, female    DOB: [birth year], 62 y.o.   MRN: [id number]  HPI: Janet Zuniga is a 62 y.o. female  Chief Complaint  Patient presents with  . Cough    x's 3 days. Patient states she has to sit up at night to sleep.   . Nasal Congestion  . Wheezing  . Medication Refill    Patient states she needs a refill on all of her medications  . Anxiety    Patient states her mom just died and her nerves are tore up. Patient was emotional.   3 days of congestion, productive cough, wheezing, SOB. Worst when laying down. Not trying anything over the counter. Several sick contacts lately. Denies fever, chills, body aches, CP.   Taking 30 mg buspar twice daily, does not feel like it's nearly enough currently as she's been suffering from severe grief after the recent loss of her mother. Has been on xanax in the past which she found very helpful for her anxiety flares. Has not gotten any grief counseling. Denies SI/HI, but having consistent crying spells, anhedonia, anxiety episodes.   Needing refills on her medications today. Has been out of her medications for several months. Flexeril and ibuprofen had been helping fairly well with here severe back issues. Not currently followed by pain management.   Has been on HCTZ in the past for BP and swelling, wanting to try something a little stronger. No side effects noted.   Past Medical History:  Diagnosis Date  . Ankylosing spondylitis ([place])   . Anxiety   . Arthritis   . Blood in stool   . Breast cancer ([place]) 2010   Left breast- chemo/radiation  . Chronic low back pain   . Chronic pain   . Depression   . Headache   . Hepatitis C 2007  . Hip fracture ([place])   . History of blood transfusion   . Hypertension   . Insomnia    . OA (osteoarthritis)   . Osteoporosis   . Spinal stenosis of lumbar region   . Spondyloarthritis    Social History   Socioeconomic History  . Marital status: Legally Separated    Spouse name: Not on file  . Number of children: Not on file  . Years of education: Not on file  . Highest education level: Not on file  Social Needs  . Financial resource strain: Not on file  . Food insecurity - worry: Not on file  . Food insecurity - inability: Not on file  . Transportation needs - medical: Not on file  . Transportation needs - non-medical: Not on file  Occupational History  . Not on file  Tobacco Use  . Smoking status: Current Every Day Smoker    Packs/day: 0.50    Types: Cigarettes  . Smokeless tobacco: Never Used  Substance and Sexual Activity  . Alcohol use: Yes    Comment: occasionally a glass of wine  . Drug use: Yes    Types: Marijuana  . Sexual activity: Not on file  Other Topics Concern  . Not on file  Social History Narrative  . Not on file   Relevant past medical, surgical, family  and social history reviewed and updated as indicated. Interim medical history since our last visit reviewed. Allergies and medications reviewed and updated.  Review of Systems  Per HPI unless specifically indicated above     Objective:    BP (!) 150/89 (BP Location: Left Arm, Patient Position: Sitting, Cuff Size: Large)   Pulse 75   Temp 98.4 F (36.9 C) (Tympanic)   Wt 168 lb 6.4 oz (76.4 kg)   SpO2 99%   BMI 31.82 kg/m   Wt Readings from Last 3 Encounters:  03/19/17 168 lb 6.4 oz (76.4 kg)  01/22/17 166 lb (75.3 kg)  10/09/16 179 lb 11.2 oz (81.5 kg)    Physical Exam  Constitutional: She is oriented to person, place, and time. She appears well-developed and well-nourished. No distress.  HENT:  Head: Atraumatic.  Right Ear: External ear normal.  Left Ear: External ear normal.  Mouth/Throat: No oropharyngeal exudate.  Oropharynx and nasal mucosa erythematous,  rhinorrhea present  Eyes: Conjunctivae are normal. Pupils are equal, round, and reactive to light. No scleral icterus.  Neck: Normal range of motion. Neck supple.  Cardiovascular: Normal rate and normal heart sounds.  Pulmonary/Chest: Effort normal and breath sounds normal. No respiratory distress.  Musculoskeletal: Normal range of motion.  Neurological: She is alert and oriented to person, place, and time.  Skin: Skin is warm and dry.  Psychiatric: Thought content normal.  Crying throughout interview  Nursing note and vitals reviewed.   Results for orders placed or performed during the hospital encounter of 01/22/17  CBC with Differential/Platelet  Result Value Ref Range   WBC 12.1 (H) 3.6 - 11.0 K/uL   RBC 4.29 3.80 - 5.20 MIL/uL   Hemoglobin 12.1 12.0 - 16.0 g/dL   HCT 37.0 35.0 - 47.0 %   MCV 86.4 80.0 - 100.0 fL   MCH 28.1 26.0 - 34.0 pg   MCHC 32.6 32.0 - 36.0 g/dL   RDW 17.2 (H) 11.5 - 14.5 %   Platelets 387 150 - 440 K/uL   Neutrophils Relative % 70 %   Neutro Abs 8.5 (H) 1.4 - 6.5 K/uL   Lymphocytes Relative 22 %   Lymphs Abs 2.7 1.0 - 3.6 K/uL   Monocytes Relative 6 %   Monocytes Absolute 0.7 0.2 - 0.9 K/uL   Eosinophils Relative 1 %   Eosinophils Absolute 0.1 0 - 0.7 K/uL   Basophils Relative 1 %   Basophils Absolute 0.1 0 - 0.1 K/uL  Comprehensive metabolic panel  Result Value Ref Range   Sodium 140 135 - 145 mmol/L   Potassium 3.7 3.5 - 5.1 mmol/L   Chloride 107 101 - 111 mmol/L   CO2 21 (L) 22 - 32 mmol/L   Glucose, Bld 100 (H) 65 - 99 mg/dL   BUN 14 6 - 20 mg/dL   Creatinine, Ser 0.51 0.44 - 1.00 mg/dL   Calcium 8.5 (L) 8.9 - 10.3 mg/dL   Total Protein 7.8 6.5 - 8.1 g/dL   Albumin 3.6 3.5 - 5.0 g/dL   AST 24 15 - 41 U/L   ALT 15 14 - 54 U/L   Alkaline Phosphatase 82 38 - 126 U/L   Total Bilirubin 0.5 0.3 - 1.2 mg/dL   GFR calc non Af Amer >60 >60 mL/min   GFR calc Af Amer >60 >60 mL/min   Anion gap 12 5 - 15  Urinalysis, Complete w Microscopic    Result Value Ref Range   Color, Urine STRAW (A) YELLOW  APPearance CLEAR (A) CLEAR   Specific Gravity, Urine 1.005 1.005 - 1.030   pH 7.0 5.0 - 8.0   Glucose, UA NEGATIVE NEGATIVE mg/dL   Hgb urine dipstick SMALL (A) NEGATIVE   Bilirubin Urine NEGATIVE NEGATIVE   Ketones, ur NEGATIVE NEGATIVE mg/dL   Protein, ur NEGATIVE NEGATIVE mg/dL   Nitrite NEGATIVE NEGATIVE   Leukocytes, UA NEGATIVE NEGATIVE   RBC / HPF 0-5 0 - 5 RBC/hpf   WBC, UA 0-5 0 - 5 WBC/hpf   Bacteria, UA NONE SEEN NONE SEEN   Squamous Epithelial / LPF 0-5 (A) NONE SEEN  Troponin I  Result Value Ref Range   Troponin I <0.03 <0.03 ng/mL      Assessment & Plan:   Problem List Items Addressed This Visit      Cardiovascular and Mediastinum   Hypertension - Primary    Will switch to lasix and monitor closely. Recheck in 1 month with BMP      Relevant Medications   furosemide (LASIX) 20 MG tablet     Nervous and Auditory   Chronic bilateral low back pain with bilateral sciatica    Will continue flexeril and ibuprofen prn. Refills sent today      Relevant Medications   ALPRAZolam (XANAX) 1 MG tablet   cyclobenzaprine (FLEXERIL) 10 MG tablet     Other   Anxiety    Will add no more than 2 months of xanax BID prn for her severe grief. Discussed w/ patient the short term nature of this medication as she's had some issues with addiction in the past. Pt agreeable. Strongly encouraged grief counseling. Pt has resources available through hospice and will try to reach out. Continue current regimen      Relevant Medications   ALPRAZolam (XANAX) 1 MG tablet    Other Visit Diagnoses    Viral URI with cough       No evidence of bacterial infection. Supportive care discussed with OTC remedies. Pt to f/u if worsening or no improvement       Follow up plan: Return in about 4 weeks (around 04/16/2017) for CPE, AWV with Tiffany.

## 2017-03-21 ENCOUNTER — Ambulatory Visit: Payer: Medicare Other | Admitting: Family Medicine

## 2017-03-21 ENCOUNTER — Other Ambulatory Visit: Payer: Self-pay | Admitting: Family Medicine

## 2017-03-21 NOTE — Assessment & Plan Note (Signed)
Will continue flexeril and ibuprofen prn. Refills sent today

## 2017-03-21 NOTE — Assessment & Plan Note (Signed)
Will add no more than 2 months of xanax BID prn for her severe grief. Discussed w/ patient the short term nature of this medication as she's had some issues with addiction in the past. Pt agreeable. Strongly encouraged grief counseling. Pt has resources available through hospice and will try to reach out. Continue current regimen

## 2017-03-21 NOTE — Assessment & Plan Note (Signed)
Will switch to lasix and monitor closely. Recheck in 1 month with BMP

## 2017-03-21 NOTE — Patient Instructions (Signed)
Follow up in 1 month   

## 2017-03-25 NOTE — Progress Notes (Deleted)
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  INTERVAL HISTORY: Patient returns to clinic for routine yearly evaluation. She currently feels well and is asymptomatic. She continues to have chronic back and joint pain. She continues to have high anxiety and multiple social issues.  She denies any chest pain, shortness of breath, or hemoptysis. She denies any abdominal pain. She denies any nausea, vomiting, constipation, or diarrhea. She has no urinary complaints. She denies any easy bleeding or bruising.  Patient offers no further specific complaints.   REVIEW OF SYSTEMS:   Review of Systems  Constitutional: Negative for fever, malaise/fatigue and weight loss.  Respiratory: Negative.  Negative for cough and shortness of breath.   Cardiovascular: Negative.  Negative for chest pain and leg swelling.  Gastrointestinal: Negative.  Negative for abdominal pain.  Genitourinary: Negative.   Musculoskeletal: Positive for back pain and joint pain.  Neurological: Negative for weakness.  Psychiatric/Behavioral: Positive for depression. The patient is nervous/anxious.     As per HPI. Otherwise, a complete review of systems is negative.  PAST MEDICAL HISTORY: Past Medical History:  Diagnosis Date  . Ankylosing spondylitis ([place])   . Anxiety   . Arthritis   . Blood in stool   . Breast cancer ([place]) 2010   Left breast- chemo/radiation  . Chronic low back pain   . Chronic pain   . Depression   . Headache   . Hepatitis C 2007  . Hip fracture ([place])   . History of blood transfusion   . Hypertension   . Insomnia   . OA (osteoarthritis)   . Osteoporosis   . Spinal stenosis of lumbar region   . Spondyloarthritis      PAST SURGICAL HISTORY: Past Surgical History:  Procedure Laterality Date  . BREAST BIOPSY Right 1983   neg  . BREAST EXCISIONAL BIOPSY Left 2010  . BREAST LUMPECTOMY Left 2010  . FEMUR FRACTURE SURGERY    . FOOT SURGERY  2000 and 2003  . INTRAMEDULLARY (IM) NAIL INTERTROCHANTERIC Right 01/12/2015   Procedure: INTRAMEDULLARY (IM) NAIL INTERTROCHANTRIC;  Surgeon: Earnestine Leys, MD;  Location: [facility] ORS;  Service: Orthopedics;  Laterality: Right;  . KNEE SURGERY Left 2004  . TUBAL LIGATION      FAMILY HISTORY Family History  Problem Relation Age of Onset  . Arthritis Mother   . Stroke Mother   . Heart disease Mother   . Hypertension Mother   . Hyperlipidemia Mother   . Depression Mother   . Seizures Mother   . Osteoporosis Mother   . Hypertension Father   . Hypertension Brother   . Arthritis Brother   . Depression Brother   . Diabetes Brother   . Hyperlipidemia Brother   . Heart disease Maternal Aunt   . Seizures Sister   . Stroke Sister   . Breast cancer Neg Hx   . COPD Neg Hx   . Cancer Neg Hx        ADVANCED DIRECTIVES:    HEALTH MAINTENANCE: Social History   Tobacco Use  . Smoking status: Current Every Day Smoker    Packs/day: 0.50    Types: Cigarettes  . Smokeless tobacco: Never Used  Substance Use Topics  . Alcohol use: Yes    Comment: occasionally a  glass of wine  . Drug use: Yes    Types: Marijuana     Colonoscopy:  PAP:  Bone density:  Lipid panel:  Allergies  Allergen Reactions  . Tramadol Nausea Only  . Penicillins Rash and Other (See Comments)    Has patient had a PCN reaction causing immediate rash, facial/tongue/throat swelling, SOB or lightheadedness with hypotension: Yes Has patient had a PCN reaction causing severe rash involving mucus membranes or skin necrosis: No Has patient had a PCN reaction that required hospitalization No Has patient had a PCN reaction occurring within the last 10 years: No If all of the above answers  are "NO", then may proceed with Cephalosporin use.     Current Outpatient Medications  Medication Sig Dispense Refill  . ALPRAZolam (XANAX) 1 MG tablet Take 1 tablet (1 mg total) by mouth 2 (two) times daily as needed for anxiety. 60 tablet 0  . busPIRone (BUSPAR) 30 MG tablet Take by mouth.    . cyclobenzaprine (FLEXERIL) 10 MG tablet Take 1 tablet (10 mg total) by mouth 3 (three) times daily as needed for muscle spasms. 90 tablet 2  . famotidine (PEPCID) 40 MG tablet Take 1 tablet (40 mg total) by mouth every evening. 30 tablet 1  . FLUoxetine (PROZAC) 40 MG capsule Take by mouth.    . furosemide (LASIX) 20 MG tablet Take 1 tablet (20 mg total) by mouth daily. 30 tablet 3  . IBU 800 MG tablet Take 1 tablet (800 mg total) by mouth 3 (three) times daily as needed. 90 tablet 1  . lansoprazole (PREVACID) 15 MG capsule Take 15 mg by mouth.    . traZODone (DESYREL) 150 MG tablet Take 150 mg by mouth.     No current facility-administered medications for this visit.     OBJECTIVE: There were no vitals filed for this visit.   There is no height or weight on file to calculate BMI.    ECOG FS:0 - Asymptomatic  General: Well-developed, well-nourished, no acute distress. Eyes: Pink conjunctiva, anicteric sclera Breasts: Bilateral breast and axilla without lumps or masses.  Lungs: Clear to auscultation bilaterally. Heart: Regular rate and rhythm. No rubs, murmurs, or gallops. Abdomen: Soft, nontender, nondistended. No organomegaly noted, normoactive bowel sounds. Musculoskeletal: No edema, cyanosis, or clubbing. Neuro: Alert, answering all questions appropriately. Cranial nerves grossly intact. Skin: No rashes or petechiae noted. Psych: Normal affect.  LAB RESULTS:  Lab Results  Component Value Date   NA 140 01/22/2017   K 3.7 01/22/2017   CL 107 01/22/2017   CO2 21 (L) 01/22/2017   GLUCOSE 100 (H) 01/22/2017   BUN 14 01/22/2017   CREATININE 0.51 01/22/2017   CALCIUM 8.5 (L)  01/22/2017   PROT 7.8 01/22/2017   ALBUMIN 3.6 01/22/2017   AST 24 01/22/2017   ALT 15 01/22/2017   ALKPHOS 82 01/22/2017   BILITOT 0.5 01/22/2017   GFRNONAA >60 01/22/2017   GFRAA >60 01/22/2017    Lab Results  Component Value Date   WBC 12.1 (H) 01/22/2017   NEUTROABS 8.5 (H) 01/22/2017   HGB 12.1 01/22/2017   HCT 37.0 01/22/2017   MCV 86.4 01/22/2017   PLT 387 01/22/2017   Lab Results  Component Value Date   LABCA2 31.3 03/07/2016     STUDIES: No results found.  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  PLAN:    1.  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 Patient's most recent mammogram on May 19, 2015 was reported as BI-RADS 2. Repeat in May 2018. Patient completed all of her chemotherapy in December 2010 and her adjuvant XRT in the Spring of 2011. Return to clinic in 1 year routine evaluation.  2.  Anxiety: Patient will no longer receive Xanax from this clinic. 3.  Ankylosing spondylitis: Treatment for her primary rheumatologist.  Patient should not receive narcotics from this clinic.  4.  Hepatitis C: Patient reports this has resolved with treatment. 5.  Previous suicide attempts: No refills on Xanax or narcotics as above.  Patient expressed understanding and was in agreement with this plan. She also understands that She can call clinic at any time with any questions, concerns, or complaints.    Lloyd Huger, MD   03/25/2017 10:29 PM

## 2017-03-27 ENCOUNTER — Other Ambulatory Visit: Payer: Self-pay | Admitting: Family Medicine

## 2017-03-27 NOTE — Telephone Encounter (Signed)
Copied from [place]. Topic: Quick Communication - Rx Refill/Question >> Mar 27, 2017  1:57 PM Scherrie Gerlach wrote: Medication:  a pain medication Pt would like to know if Apolonio Schneiders would prescribe her a pain medicine for her chronic pain. Pt declined to make an appointment.  Pt states she can barely walk, or sleep at night due to the pain she has all over. Pt states Apolonio Schneiders is aware of the pain, but she forgot to ask for medication at last visit.  [place], [place] - [place] [phone] (Phone) [phone] (Fax

## 2017-03-27 NOTE — Telephone Encounter (Signed)
Call pt No pain medication from this office.  Patient can be referred to the pain clinic but would need an appointment to do so.

## 2017-03-28 ENCOUNTER — Inpatient Hospital Stay: Payer: Medicare Other

## 2017-03-28 ENCOUNTER — Inpatient Hospital Stay: Payer: Medicare Other | Admitting: Oncology

## 2017-03-28 NOTE — Telephone Encounter (Signed)
Pt has been referred to pain management in the past and was denied due to no-shows. Pt aware we will not be giving her any pain medication beyond the ibuprofen she's already gotten

## 2017-03-29 NOTE — Telephone Encounter (Signed)
Called and left patient a VM letting her know what Apolonio Schneiders said. Asked for patient to call back with any questions or concerns.

## 2017-04-09 ENCOUNTER — Ambulatory Visit: Payer: Medicare Other | Admitting: Family Medicine

## 2017-04-11 ENCOUNTER — Other Ambulatory Visit: Payer: Medicare Other

## 2017-04-11 ENCOUNTER — Ambulatory Visit: Payer: Medicare Other | Admitting: Oncology

## 2017-04-18 ENCOUNTER — Telehealth: Payer: Self-pay | Admitting: Family Medicine

## 2017-04-18 MED ORDER — ALPRAZOLAM 1 MG PO TABS: 1.0000 mg | ORAL_TABLET | Freq: Two times a day (BID) | ORAL | 0 refills | Status: DC | PRN

## 2017-04-18 NOTE — Telephone Encounter (Signed)
Patient calling to check on the status of her xanax refill. States she lost her mother and is very lost.

## 2017-04-18 NOTE — Telephone Encounter (Signed)
Copied from [place] [id number]. Topic: Quick Communication - Rx Refill/Question >> Apr 17, 2017  1:30 PM Percell Belt A wrote: Medication: ALPRAZolam Duanne Moron) 1 MG tablet [[id number]]  Has the patient contacted their pharmacy? No  (Agent: If no, request that the patient contact the pharmacy for the refill.) Preferred Pharmacy (with phone number or street name): [facility]: Please be advised that RX refills may take up to 3 business days. We ask that you follow-up with your pharmacy.

## 2017-04-18 NOTE — Telephone Encounter (Signed)
Pt called again checking on RX for xanax. She said she just lost her mama and her nerves are a wreck. She said it doesn't usually take this long. I advised pt that refills should allow 72 hours to be sent in. Pt states she has 1 dose left and requesting a call to let her know when it is sent in.  Call back # [phone].

## 2017-04-18 NOTE — Telephone Encounter (Signed)
Pt calling back and wants to know if Apolonio Schneiders is going to call in xanax. Pt want a call back because her friend is going to take her if they can get called in.

## 2017-04-18 NOTE — Telephone Encounter (Signed)
Routing to provider. Given #60 03/19/2017 Take 1 tab by mouth two times daily.

## 2017-04-18 NOTE — Telephone Encounter (Signed)
Request for refill of Xanax 1mg  tablet.  Last refill 03/19/17 #60 LOV: 03/19/17  [place]

## 2017-04-18 NOTE — Telephone Encounter (Signed)
Rx printed, post dated for 04/19/2017 as she isn't due until tomorrow. Please call and let her know it's been faxed and will be available tomorrow for her.

## 2017-04-19 NOTE — Telephone Encounter (Signed)
Left message for patient regarding the script. Faxed to [facility]

## 2017-04-25 ENCOUNTER — Ambulatory Visit (INDEPENDENT_AMBULATORY_CARE_PROVIDER_SITE_OTHER): Payer: Medicare Other

## 2017-04-25 ENCOUNTER — Ambulatory Visit (INDEPENDENT_AMBULATORY_CARE_PROVIDER_SITE_OTHER): Payer: Medicare Other | Admitting: Family Medicine

## 2017-04-25 ENCOUNTER — Encounter: Payer: Self-pay | Admitting: Family Medicine

## 2017-04-25 VITALS — BP 124/86 | HR 86 | Resp 16 | Ht 60.0 in | Wt 165.3 lb

## 2017-04-25 VITALS — BP 124/86 | HR 84 | Temp 97.8°F | Resp 16 | Ht 60.0 in | Wt 165.3 lb

## 2017-04-25 DIAGNOSIS — F419 Anxiety disorder, unspecified: Secondary | ICD-10-CM

## 2017-04-25 DIAGNOSIS — Z1211 Encounter for screening for malignant neoplasm of colon: Secondary | ICD-10-CM | POA: Diagnosis not present

## 2017-04-25 DIAGNOSIS — I10 Essential (primary) hypertension: Secondary | ICD-10-CM

## 2017-04-25 DIAGNOSIS — F331 Major depressive disorder, recurrent, moderate: Secondary | ICD-10-CM

## 2017-04-25 DIAGNOSIS — M457 Ankylosing spondylitis of lumbosacral region: Secondary | ICD-10-CM

## 2017-04-25 DIAGNOSIS — Z114 Encounter for screening for human immunodeficiency virus [HIV]: Secondary | ICD-10-CM | POA: Diagnosis not present

## 2017-04-25 DIAGNOSIS — B182 Chronic viral hepatitis C: Secondary | ICD-10-CM

## 2017-04-25 DIAGNOSIS — G4700 Insomnia, unspecified: Secondary | ICD-10-CM

## 2017-04-25 DIAGNOSIS — Z0000 Encounter for general adult medical examination without abnormal findings: Secondary | ICD-10-CM | POA: Diagnosis not present

## 2017-04-25 DIAGNOSIS — R32 Unspecified urinary incontinence: Secondary | ICD-10-CM

## 2017-04-25 LAB — UA/M W/RFLX CULTURE, ROUTINE
Bilirubin, UA: NEGATIVE
Glucose, UA: NEGATIVE
Ketones, UA: NEGATIVE
LEUKOCYTES UA: NEGATIVE
Nitrite, UA: NEGATIVE
PH UA: 6 (ref 5.0–7.5)
Specific Gravity, UA: 1.02 (ref 1.005–1.030)
Urobilinogen, Ur: 1 mg/dL (ref 0.2–1.0)

## 2017-04-25 LAB — MICROSCOPIC EXAMINATION: BACTERIA UA: NONE SEEN

## 2017-04-25 MED ORDER — TRAZODONE HCL 150 MG PO TABS: 150.0000 mg | ORAL_TABLET | Freq: Every evening | ORAL | 1 refills | Status: DC | PRN

## 2017-04-25 MED ORDER — FLUOXETINE HCL 40 MG PO CAPS: 40.0000 mg | ORAL_CAPSULE | Freq: Every day | ORAL | 0 refills | Status: DC

## 2017-04-25 NOTE — Patient Instructions (Addendum)
Ms. Janet Zuniga , Thank you for taking time to come for your Medicare Wellness Visit. I appreciate your ongoing commitment to your health goals. Please review the following plan we discussed and let me know if I can assist you in the future.   Screening recommendations/referrals: Colonoscopy: referral sent to GI  Mammogram: up to date Bone Density: due at age 60  Recommended yearly ophthalmology/optometry visit for glaucoma screening and checkup Recommended yearly dental visit for hygiene and checkup  Vaccinations: Influenza vaccine: up to date Pneumococcal vaccine: due at age 66 Tdap vaccine: due, check with your insurance company for coverage  Shingles vaccine: eligible, check with your insurance company for coverage   Advanced directives: Advance directive discussed with you today. Even though you declined this today please call our office should you change your mind and we can give you the proper paperwork for you to fill out.  Conditions/risks identified: smoking cessation discussed.  Burgess Estelle, RN will call you to discuss lung cancer screening.   Preventive Care 40-64 Years, Female Preventive care refers to lifestyle choices and visits with your health care provider that can promote health and wellness. What does preventive care include?  A yearly physical exam. This is also called an annual well check.  Dental exams once or twice a year.  Routine eye exams. Ask your health care provider how often you should have your eyes checked.  Personal lifestyle choices, including:  Daily care of your teeth and gums.  Regular physical activity.  Eating a healthy diet.  Avoiding tobacco and drug use.  Limiting alcohol use.  Practicing safe sex.  Taking low-dose aspirin daily starting at age 47.  Taking vitamin and mineral supplements as recommended by your health care provider. What happens during an annual well check? The services and screenings done by your health care  provider during your annual well check will depend on your age, overall health, lifestyle risk factors, and family history of disease. Counseling  Your health care provider may ask you questions about your:  Alcohol use.  Tobacco use.  Drug use.  Emotional well-being.  Home and relationship well-being.  Sexual activity.  Eating habits.  Work and work Statistician.  Method of birth control.  Menstrual cycle.  Pregnancy history. Screening  You may have the following tests or measurements:  Height, weight, and BMI.  Blood pressure.  Lipid and cholesterol levels. These may be checked every 5 years, or more frequently if you are over 68 years old.  Skin check.  Lung cancer screening. You may have this screening every year starting at age 27 if you have a 30-pack-year history of smoking and currently smoke or have quit within the past 15 years.  Fecal occult blood test (FOBT) of the stool. You may have this test every year starting at age 41.  Flexible sigmoidoscopy or colonoscopy. You may have a sigmoidoscopy every 5 years or a colonoscopy every 10 years starting at age 68.  Hepatitis C blood test.  Hepatitis B blood test.  Sexually transmitted disease (STD) testing.  Diabetes screening. This is done by checking your blood sugar (glucose) after you have not eaten for a while (fasting). You may have this done every 1-3 years.  Mammogram. This may be done every 1-2 years. Talk to your health care provider about when you should start having regular mammograms. This may depend on whether you have a family history of breast cancer.  BRCA-related cancer screening. This may be done if you have a  family history of breast, ovarian, tubal, or peritoneal cancers.  Pelvic exam and Pap test. This may be done every 3 years starting at age 38. Starting at age 73, this may be done every 5 years if you have a Pap test in combination with an HPV test.  Bone density scan. This is done  to screen for osteoporosis. You may have this scan if you are at high risk for osteoporosis. Discuss your test results, treatment options, and if necessary, the need for more tests with your health care provider. Vaccines  Your health care provider may recommend certain vaccines, such as:  Influenza vaccine. This is recommended every year.  Tetanus, diphtheria, and acellular pertussis (Tdap, Td) vaccine. You may need a Td booster every 10 years.  Zoster vaccine. You may need this after age 29.  Pneumococcal 13-valent conjugate (PCV13) vaccine. You may need this if you have certain conditions and were not previously vaccinated.  Pneumococcal polysaccharide (PPSV23) vaccine. You may need one or two doses if you smoke cigarettes or if you have certain conditions. Talk to your health care provider about which screenings and vaccines you need and how often you need them. This information is not intended to replace advice given to you by your health care provider. Make sure you discuss any questions you have with your health care provider. Document Released: 01/15/2015 Document Revised: 09/08/2015 Document Reviewed: 10/20/2014 Elsevier Interactive Patient Education  2017 [place] Prevention in the Home Falls can cause injuries. They can happen to people of all ages. There are many things you can do to make your home safe and to help prevent falls. What can I do on the outside of my home?  Regularly fix the edges of walkways and driveways and fix any cracks.  Remove anything that might make you trip as you walk through a door, such as a raised step or threshold.  Trim any bushes or trees on the path to your home.  Use bright outdoor lighting.  Clear any walking paths of anything that might make someone trip, such as rocks or tools.  Regularly check to see if handrails are loose or broken. Make sure that both sides of any steps have handrails.  Any raised decks and porches  should have guardrails on the edges.  Have any leaves, snow, or ice cleared regularly.  Use sand or salt on walking paths during winter.  Clean up any spills in your garage right away. This includes oil or grease spills. What can I do in the bathroom?  Use night lights.  Install grab bars by the toilet and in the tub and shower. Do not use towel bars as grab bars.  Use non-skid mats or decals in the tub or shower.  If you need to sit down in the shower, use a plastic, non-slip stool.  Keep the floor dry. Clean up any water that spills on the floor as soon as it happens.  Remove soap buildup in the tub or shower regularly.  Attach bath mats securely with double-sided non-slip rug tape.  Do not have throw rugs and other things on the floor that can make you trip. What can I do in the bedroom?  Use night lights.  Make sure that you have a light by your bed that is easy to reach.  Do not use any sheets or blankets that are too big for your bed. They should not hang down onto the floor.  Have a firm  chair that has side arms. You can use this for support while you get dressed.  Do not have throw rugs and other things on the floor that can make you trip. What can I do in the kitchen?  Clean up any spills right away.  Avoid walking on wet floors.  Keep items that you use a lot in easy-to-reach places.  If you need to reach something above you, use a strong step stool that has a grab bar.  Keep electrical cords out of the way.  Do not use floor polish or wax that makes floors slippery. If you must use wax, use non-skid floor wax.  Do not have throw rugs and other things on the floor that can make you trip. What can I do with my stairs?  Do not leave any items on the stairs.  Make sure that there are handrails on both sides of the stairs and use them. Fix handrails that are broken or loose. Make sure that handrails are as long as the stairways.  Check any carpeting to  make sure that it is firmly attached to the stairs. Fix any carpet that is loose or worn.  Avoid having throw rugs at the top or bottom of the stairs. If you do have throw rugs, attach them to the floor with carpet tape.  Make sure that you have a light switch at the top of the stairs and the bottom of the stairs. If you do not have them, ask someone to add them for you. What else can I do to help prevent falls?  Wear shoes that:  Do not have high heels.  Have rubber bottoms.  Are comfortable and fit you well.  Are closed at the toe. Do not wear sandals.  If you use a stepladder:  Make sure that it is fully opened. Do not climb a closed stepladder.  Make sure that both sides of the stepladder are locked into place.  Ask someone to hold it for you, if possible.  Clearly mark and make sure that you can see:  Any grab bars or handrails.  First and last steps.  Where the edge of each step is.  Use tools that help you move around (mobility aids) if they are needed. These include:  Canes.  Walkers.  Scooters.  Crutches.  Turn on the lights when you go into a dark area. Replace any light bulbs as soon as they burn out.  Set up your furniture so you have a clear path. Avoid moving your furniture around.  If any of your floors are uneven, fix them.  If there are any pets around you, be aware of where they are.  Review your medicines with your doctor. Some medicines can make you feel dizzy. This can increase your chance of falling. Ask your doctor what other things that you can do to help prevent falls. This information is not intended to replace advice given to you by your health care provider. Make sure you discuss any questions you have with your health care provider. Document Released: 10/15/2008 Document Revised: 05/27/2015 Document Reviewed: 01/23/2014 Elsevier Interactive Patient Education  2017 [employer].

## 2017-04-25 NOTE — Assessment & Plan Note (Signed)
S/p tx with Harvoni

## 2017-04-25 NOTE — Progress Notes (Signed)
BP 124/86 (BP Location: Left Arm, Patient Position: Sitting, Cuff Size: Normal)   Pulse 86   Resp 16   Ht 5' (1.524 m)   Wt 165 lb 4.8 oz (75 kg)   SpO2 98%   BMI 32.28 kg/m    Subjective:    Patient ID: Janet Zuniga, female    DOB: 1955/06/10, 62 y.o.   MRN: [id number]  HPI: STACIE TEMPLIN is a 62 y.o. female presenting on 04/25/2017 for comprehensive medical examination. Current medical complaints include:see below  BPs under excellent control with current regimen, no concerns, side effects, CP, SOB, HAs, dizziness. Does states she's still swelling a fair amount even with 20 mg lasix, hoping to increase and see if that helps. Has known rheumatologic dz which likely contributes significantly to her generalized edema.   Wanting a second opinion for Rheumatology. Current specialist decided she was not a good candidate to go back on enbrel, but this has been the only thing to give her relief from her chronic back pain from ankylosing spondylitis and DDD.   Still struggling from her mother's death with frequent panic attacks, constant crying spells and dysphoric moods. The xanax helps significantly. Denies SI/HI.   Depression Screen done today and results listed below:  Depression screen [facility] 2/9 04/25/2017 08/10/2016  Decreased Interest 1 2  Down, Depressed, Hopeless 1 2  PHQ - 2 Score 2 4  Altered sleeping 1 -  Tired, decreased energy 1 -  Change in appetite 0 1  Feeling bad or failure about yourself  0 1  Trouble concentrating 0 0  Moving slowly or fidgety/restless 3 1  Suicidal thoughts 0 0  PHQ-9 Score 7 -  Difficult doing work/chores Not difficult at all -   The patient does not have a history of falls. I did not complete a risk assessment for falls. A plan of care for falls was not documented.   Past Medical History:  Past Medical History:  Diagnosis Date  . Ankylosing spondylitis ([place])   . Anxiety   . Arthritis   . Blood in stool   . Breast cancer ([place]) 2010   Left  breast- chemo/radiation  . Chronic low back pain   . Chronic pain   . Depression   . Headache   . Hepatitis C 2007  . Hip fracture ([place])   . History of blood transfusion   . Hypertension   . Insomnia   . OA (osteoarthritis)   . Osteoporosis   . Spinal stenosis of lumbar region   . Spondyloarthritis     Surgical History:  Past Surgical History:  Procedure Laterality Date  . BREAST BIOPSY Right 1983   neg  . BREAST EXCISIONAL BIOPSY Left 2010  . BREAST LUMPECTOMY Left 2010  . FEMUR FRACTURE SURGERY    . FOOT SURGERY  2000 and 2003  . INTRAMEDULLARY (IM) NAIL INTERTROCHANTERIC Right 01/12/2015   Procedure: INTRAMEDULLARY (IM) NAIL INTERTROCHANTRIC;  Surgeon: Earnestine Leys, MD;  Location: [facility] ORS;  Service: Orthopedics;  Laterality: Right;  . KNEE SURGERY Left 2004  . TUBAL LIGATION      Medications:  Current Outpatient Medications on File Prior to Visit  Medication Sig  . ALPRAZolam (XANAX) 1 MG tablet Take 1 tablet (1 mg total) by mouth 2 (two) times daily as needed for anxiety.  . cyclobenzaprine (FLEXERIL) 10 MG tablet Take 1 tablet (10 mg total) by mouth 3 (three) times daily as needed for muscle spasms.  . famotidine (PEPCID) 40  MG tablet Take 1 tablet (40 mg total) by mouth every evening.  . IBU 800 MG tablet Take 1 tablet (800 mg total) by mouth 3 (three) times daily as needed.   No current facility-administered medications on file prior to visit.     Allergies:  Allergies  Allergen Reactions  . Tramadol Nausea Only  . Penicillins Rash and Other (See Comments)    Has patient had a PCN reaction causing immediate rash, facial/tongue/throat swelling, SOB or lightheadedness with hypotension: Yes Has patient had a PCN reaction causing severe rash involving mucus membranes or skin necrosis: No Has patient had a PCN reaction that required hospitalization No Has patient had a PCN reaction occurring within the last 10 years: No If all of the above answers are "NO", then  may proceed with Cephalosporin use.     Social History:  Social History   Socioeconomic History  . Marital status: Legally Separated    Spouse name: Not on file  . Number of children: Not on file  . Years of education: Not on file  . Highest education level: Not on file  Occupational History  . Not on file  Social Needs  . Financial resource strain: Not hard at all  . Food insecurity:    Worry: Never true    Inability: Never true  . Transportation needs:    Medical: Yes    Non-medical: Yes  Tobacco Use  . Smoking status: Current Every Day Smoker    Packs/day: 0.50    Types: Cigarettes  . Smokeless tobacco: Never Used  Substance and Sexual Activity  . Alcohol use: Yes    Comment: occasionally a glass of wine cooler  . Drug use: Yes    Types: Marijuana    Comment: occasionally   . Sexual activity: Not on file  Lifestyle  . Physical activity:    Days per week: 0 days    Minutes per session: 0 min  . Stress: Not at all  Relationships  . Social connections:    Talks on phone: More than three times a week    Gets together: Never    Attends religious service: Never    Active member of club or organization: No    Attends meetings of clubs or organizations: Never    Relationship status: Separated  . Intimate partner violence:    Fear of current or ex partner: No    Emotionally abused: No    Physically abused: No    Forced sexual activity: No  Other Topics Concern  . Not on file  Social History Narrative  . Not on file   Social History   Tobacco Use  Smoking Status Current Every Day Smoker  . Packs/day: 0.50  . Types: Cigarettes  Smokeless Tobacco Never Used   Social History   Substance and Sexual Activity  Alcohol Use Yes   Comment: occasionally a glass of wine cooler    Family History:  Family History  Problem Relation Age of Onset  . Arthritis Mother   . Stroke Mother   . Heart disease Mother   . Hypertension Mother   . Hyperlipidemia Mother     . Depression Mother   . Seizures Mother   . Osteoporosis Mother   . Hypertension Father   . Hypertension Brother   . Arthritis Brother   . Depression Brother   . Diabetes Brother   . Hyperlipidemia Brother   . Heart disease Maternal Aunt   . Seizures Sister   .  Stroke Sister   . Breast cancer Neg Hx   . COPD Neg Hx   . Cancer Neg Hx     Past medical history, surgical history, medications, allergies, family history and social history reviewed with patient today and changes made to appropriate areas of the chart.   Review of Systems - General ROS: negative Psychological ROS: positive for - anxiety and depression Ophthalmic ROS: negative ENT ROS: negative Allergy and Immunology ROS: negative Breast ROS: negative for breast lumps Respiratory ROS: no cough, shortness of breath, or wheezing Cardiovascular ROS: no chest pain or dyspnea on exertion Gastrointestinal ROS: no abdominal pain, change in bowel habits, or black or bloody stools Genito-Urinary ROS: no dysuria, trouble voiding, or hematuria Musculoskeletal ROS: positive for - joint pain, joint stiffness and joint swelling Neurological ROS: no TIA or stroke symptoms Dermatological ROS: negative All other ROS negative except what is listed above and in the HPI.      Objective:    BP 124/86 (BP Location: Left Arm, Patient Position: Sitting, Cuff Size: Normal)   Pulse 86   Resp 16   Ht 5' (1.524 m)   Wt 165 lb 4.8 oz (75 kg)   SpO2 98%   BMI 32.28 kg/m   Wt Readings from Last 3 Encounters:  04/25/17 165 lb 4.8 oz (75 kg)  04/25/17 165 lb 4.8 oz (75 kg)  03/19/17 168 lb 6.4 oz (76.4 kg)    Physical Exam  Constitutional: She is oriented to person, place, and time. She appears well-nourished.  HENT:  Head: Atraumatic.  Eyes: Pupils are equal, round, and reactive to light. Conjunctivae and EOM are normal.  Neck: Normal range of motion. Neck supple.  Cardiovascular: Normal rate, regular rhythm and normal heart sounds.   Pulmonary/Chest: Effort normal and breath sounds normal. No respiratory distress. She has no wheezes. Right breast exhibits no mass, no skin change and no tenderness. Left breast exhibits no mass, no skin change and no tenderness.  Abdominal: Soft. Bowel sounds are normal. She exhibits no distension. There is no tenderness. There is no guarding.  Musculoskeletal: She exhibits edema (generalized trace edema).  Antalgic gait  Neurological: She is alert and oriented to person, place, and time. No cranial nerve deficit. She exhibits normal muscle tone.  Skin: Skin is warm and dry. No rash noted. No erythema.  Psychiatric: Thought content normal.  Tearful during interview   Nursing note and vitals reviewed.   Results for orders placed or performed in visit on 04/25/17  Microscopic Examination  Result Value Ref Range   WBC, UA 0-5 0 - 5 /hpf   RBC, UA 3-10 (A) 0 - 2 /hpf   Epithelial Cells (non renal) 0-10 0 - 10 /hpf   Bacteria, UA None seen None seen/Few  CBC with Differential/Platelet  Result Value Ref Range   WBC 12.1 (H) 3.4 - 10.8 x10E3/uL   RBC 4.44 3.77 - 5.28 x10E6/uL   Hemoglobin 12.8 11.1 - 15.9 g/dL   Hematocrit 39.6 34.0 - 46.6 %   MCV 89 79 - 97 fL   MCH 28.8 26.6 - 33.0 pg   MCHC 32.3 31.5 - 35.7 g/dL   RDW 16.3 (H) 12.3 - 15.4 %   Platelets 340 150 - 379 x10E3/uL   Neutrophils 76 Not Estab. %   Lymphs 17 Not Estab. %   Monocytes 5 Not Estab. %   Eos 2 Not Estab. %   Basos 0 Not Estab. %   Neutrophils Absolute 9.2 (H) 1.4 -  7.0 x10E3/uL   Lymphocytes Absolute 2.0 0.7 - 3.1 x10E3/uL   Monocytes Absolute 0.6 0.1 - 0.9 x10E3/uL   EOS (ABSOLUTE) 0.2 0.0 - 0.4 x10E3/uL   Basophils Absolute 0.0 0.0 - 0.2 x10E3/uL   Immature Granulocytes 0 Not Estab. %   Immature Grans (Abs) 0.0 0.0 - 0.1 x10E3/uL  Comprehensive metabolic panel  Result Value Ref Range   Glucose 85 65 - 99 mg/dL   BUN 14 6 - 24 mg/dL   Creatinine, Ser 0.67 0.57 - 1.00 mg/dL   GFR calc non Af Amer 97  >59 mL/min/1.73   GFR calc Af Amer 111 >59 mL/min/1.73   BUN/Creatinine Ratio 21 9 - 23   Sodium 140 134 - 144 mmol/L   Potassium 3.8 3.5 - 5.2 mmol/L   Chloride 101 96 - 106 mmol/L   CO2 22 20 - 29 mmol/L   Calcium 9.2 8.7 - 10.2 mg/dL   Total Protein 7.5 6.0 - 8.5 g/dL   Albumin 4.2 3.5 - 5.5 g/dL   Globulin, Total 3.3 1.5 - 4.5 g/dL   Albumin/Globulin Ratio 1.3 1.2 - 2.2   Bilirubin Total <0.2 0.0 - 1.2 mg/dL   Alkaline Phosphatase 103 39 - 117 IU/L   AST 14 0 - 40 IU/L   ALT 9 0 - 32 IU/L  UA/M w/rflx Culture, Routine  Result Value Ref Range   Specific Gravity, UA 1.020 1.005 - 1.030   pH, UA 6.0 5.0 - 7.5   Color, UA Orange Yellow   Appearance Ur Hazy (A) Clear   Leukocytes, UA Negative Negative   Protein, UA Trace (A) Negative/Trace   Glucose, UA Negative Negative   Ketones, UA Negative Negative   RBC, UA 1+ (A) Negative   Bilirubin, UA Negative Negative   Urobilinogen, Ur 1.0 0.2 - 1.0 mg/dL   Nitrite, UA Negative Negative   Microscopic Examination See below:       Assessment & Plan:   Problem List Items Addressed This Visit      Cardiovascular and Mediastinum   Hypertension - Primary    Stable and WNL, pt requesting to increase her lasix to 40 mg to help reduce her generalized edema - will check electrolytes and renal function and let her know if this change is appropriate. Counseled on watching for hypotension and dehydration if dose is increased      Relevant Orders   CBC with Differential/Platelet (Completed)   Comprehensive metabolic panel (Completed)     Digestive   Hepatitis C    S/p tx with Harvoni        Musculoskeletal and Integument   Ankylosing spondylitis of lumbosacral region [facility])    New Rheumatology referral placed per pt request. Continue flexeril and ibuprofen for pain relief as needed      Relevant Orders   Ambulatory referral to Rheumatology     Other   Depression    Not under great control given grief situation, but pt wanting  to continue prozac and trazodone for now.       Relevant Medications   FLUoxetine (PROZAC) 40 MG capsule   traZODone (DESYREL) 150 MG tablet   Anxiety    Still under poor control due to grief from the loss of her mother. Continue xanax prn, pt aware this is only for the next month or so as we had agreed originally on this being very short term just for acute grief.       Relevant Medications   FLUoxetine (PROZAC) 40  MG capsule   traZODone (DESYREL) 150 MG tablet   Insomnia    Continue trazodone prn for sleep as well as good sleep hygiene tactics       Other Visit Diagnoses    Annual physical exam       Urinary incontinence, unspecified type       Relevant Orders   UA/M w/rflx Culture, Routine (Completed)       Follow up plan: No follow-ups on file.   LABORATORY TESTING:  - Pap smear: postponed by pt  IMMUNIZATIONS:   - Tdap: Tetanus vaccination status reviewed: postponed. - Influenza: Postponed to flu season  SCREENING: -Mammogram: managed by Dr Grayland Ormond, order is in for pt to schedule  - Colonoscopy: Refused  - Bone Density: Up to date   PATIENT COUNSELING:   Advised to take 1 mg of folate supplement per day if capable of pregnancy.   Sexuality: Discussed sexually transmitted diseases, partner selection, use of condoms, avoidance of unintended pregnancy  and contraceptive alternatives.   Advised to avoid cigarette smoking.  I discussed with the patient that most people either abstain from alcohol or drink within safe limits (<=14/week and <=4 drinks/occasion for males, <=7/weeks and <= 3 drinks/occasion for females) and that the risk for alcohol disorders and other health effects rises proportionally with the number of drinks per week and how often a drinker exceeds daily limits.  Discussed cessation/primary prevention of drug use and availability of treatment for abuse.   Diet: Encouraged to adjust caloric intake to maintain  or achieve ideal body weight, to  reduce intake of dietary saturated fat and total fat, to limit sodium intake by avoiding high sodium foods and not adding table salt, and to maintain adequate dietary potassium and calcium preferably from fresh fruits, vegetables, and low-fat dairy products.    stressed the importance of regular exercise  Injury prevention: Discussed safety belts, safety helmets, smoke detector, smoking near bedding or upholstery.   Dental health: Discussed importance of regular tooth brushing, flossing, and dental visits.    NEXT PREVENTATIVE PHYSICAL DUE IN 1 YEAR. No follow-ups on file.

## 2017-04-25 NOTE — Progress Notes (Signed)
Subjective:   Janet Zuniga is a 62 y.o. female who presents for an Initial Medicare Annual Wellness Visit.  Review of Systems      Cardiac Risk Factors include: obesity (BMI >30kg/m2);smoking/ tobacco exposure;hypertension     Objective:    Today's Vitals   04/25/17 1316 04/25/17 1318  BP: 124/86   Pulse: 84   Resp: 16   Temp: 97.8 F (36.6 C)   TempSrc: Temporal   SpO2: 98%   Weight: 165 lb 4.8 oz (75 kg)   Height: 5' (1.524 m)   PainSc:  9    Body mass index is 32.28 kg/m.  Advanced Directives 04/25/2017 06/26/2016 06/12/2016 03/07/2016 08/17/2015 08/03/2015 08/03/2015  Does Patient Have a Medical Advance Directive? No No No No No No No  Would patient like information on creating a medical advance directive? No - Patient declined No - Patient declined No - Patient declined - No - patient declined information - No - patient declined information    Current Medications (verified) Outpatient Encounter Medications as of 04/25/2017  Medication Sig  . ALPRAZolam (XANAX) 1 MG tablet Take 1 tablet (1 mg total) by mouth 2 (two) times daily as needed for anxiety.  . cyclobenzaprine (FLEXERIL) 10 MG tablet Take 1 tablet (10 mg total) by mouth 3 (three) times daily as needed for muscle spasms.  . famotidine (PEPCID) 40 MG tablet Take 1 tablet (40 mg total) by mouth every evening.  Marland Kitchen FLUoxetine (PROZAC) 40 MG capsule Take by mouth.  . furosemide (LASIX) 20 MG tablet Take 1 tablet (20 mg total) by mouth daily.  . IBU 800 MG tablet Take 1 tablet (800 mg total) by mouth 3 (three) times daily as needed.  . traZODone (DESYREL) 150 MG tablet Take 150 mg by mouth.  . [DISCONTINUED] lansoprazole (PREVACID) 15 MG capsule Take 15 mg by mouth.   No facility-administered encounter medications on file as of 04/25/2017.     Allergies (verified) Tramadol and Penicillins   History: Past Medical History:  Diagnosis Date  . Ankylosing spondylitis ([place])   . Anxiety   . Arthritis   . Blood in stool    . Breast cancer ([place]) 2010   Left breast- chemo/radiation  . Chronic low back pain   . Chronic pain   . Depression   . Headache   . Hepatitis C 2007  . Hip fracture ([place])   . History of blood transfusion   . Hypertension   . Insomnia   . OA (osteoarthritis)   . Osteoporosis   . Spinal stenosis of lumbar region   . Spondyloarthritis    Past Surgical History:  Procedure Laterality Date  . BREAST BIOPSY Right 1983   neg  . BREAST EXCISIONAL BIOPSY Left 2010  . BREAST LUMPECTOMY Left 2010  . FEMUR FRACTURE SURGERY    . FOOT SURGERY  2000 and 2003  . INTRAMEDULLARY (IM) NAIL INTERTROCHANTERIC Right 01/12/2015   Procedure: INTRAMEDULLARY (IM) NAIL INTERTROCHANTRIC;  Surgeon: Earnestine Leys, MD;  Location: [facility] ORS;  Service: Orthopedics;  Laterality: Right;  . KNEE SURGERY Left 2004  . TUBAL LIGATION     Family History  Problem Relation Age of Onset  . Arthritis Mother   . Stroke Mother   . Heart disease Mother   . Hypertension Mother   . Hyperlipidemia Mother   . Depression Mother   . Seizures Mother   . Osteoporosis Mother   . Hypertension Father   . Hypertension Brother   . Arthritis  Brother   . Depression Brother   . Diabetes Brother   . Hyperlipidemia Brother   . Heart disease Maternal Aunt   . Seizures Sister   . Stroke Sister   . Breast cancer Neg Hx   . COPD Neg Hx   . Cancer Neg Hx    Social History   Socioeconomic History  . Marital status: Legally Separated    Spouse name: Not on file  . Number of children: Not on file  . Years of education: Not on file  . Highest education level: Not on file  Occupational History  . Not on file  Social Needs  . Financial resource strain: Not hard at all  . Food insecurity:    Worry: Never true    Inability: Never true  . Transportation needs:    Medical: Yes    Non-medical: Yes  Tobacco Use  . Smoking status: Current Every Day Smoker    Packs/day: 0.50    Types: Cigarettes  . Smokeless tobacco: Never  Used  Substance and Sexual Activity  . Alcohol use: Yes    Comment: occasionally a glass of wine cooler  . Drug use: Yes    Types: Marijuana    Comment: occasionally   . Sexual activity: Not on file  Lifestyle  . Physical activity:    Days per week: 0 days    Minutes per session: 0 min  . Stress: Not at all  Relationships  . Social connections:    Talks on phone: More than three times a week    Gets together: Never    Attends religious service: Never    Active member of club or organization: No    Attends meetings of clubs or organizations: Never    Relationship status: Separated  Other Topics Concern  . Not on file  Social History Narrative  . Not on file    Tobacco Counseling Ready to quit: Yes Counseling given: Yes   Clinical Intake:  Pre-visit preparation completed: Yes  Pain : 0-10 Pain Score: 9  Pain Type: Chronic pain Pain Location: Generalized Pain Onset: More than a month ago Pain Frequency: Constant     Nutritional Status: BMI > 30  Obese Nutritional Risks: None Diabetes: No  How often do you need to have someone help you when you read instructions, pamphlets, or other written materials from your doctor or pharmacy?: 1 - Never What is the last grade level you completed in school?: GED   Interpreter Needed?: No  Information entered by :: Tiffany Hill,LPN    Activities of Daily Living In your present state of health, do you have any difficulty performing the following activities: 04/25/2017  Hearing? N  Vision? N  Difficulty concentrating or making decisions? N  Walking or climbing stairs? Y  Dressing or bathing? N  Doing errands, shopping? Y  Preparing Food and eating ? N  Using the Toilet? N  In the past six months, have you accidently leaked urine? Y  Comment wears protection   Do you have problems with loss of bowel control? N  Managing your Medications? N  Managing your Finances? N  Housekeeping or managing your Housekeeping? N    Some recent data might be hidden     Immunizations and Health Maintenance Immunization History  Administered Date(s) Administered  . Influenza,inj,Quad PF,6+ Mos 09/10/2015  . Influenza-Unspecified 10/02/2013, 10/03/2014  . PPD Test 03/24/2008  . Pneumococcal Polysaccharide-23 05/15/2014, 09/10/2015   Health Maintenance Due  Topic Date Due  .  HIV Screening  08/14/1972  . TETANUS/TDAP  08/14/1976  . COLONOSCOPY  08/15/2007  . PAP SMEAR  03/18/2009    Patient Care Team: Volney American, PA-C as PCP - General (Family Medicine)  Indicate any recent Medical Services you may have received from other than [facility] providers in the past year (date may be approximate).     Assessment:   This is a routine wellness examination for [place].  Hearing/Vision screen Vision Screening Comments: No eye doctor   Dietary issues and exercise activities discussed: Current Exercise Habits: The patient does not participate in regular exercise at present, Exercise limited by: None identified  Goals    . Quit Smoking     Smoking cessation discussed      Depression Screen PHQ 2/9 Scores 04/25/2017 08/10/2016  PHQ - 2 Score 2 4  PHQ- 9 Score 7 -    Fall Risk Fall Risk  04/25/2017 08/10/2016  Falls in the past year? Yes Yes  Number falls in past yr: 2 or more 1  Injury with Fall? No Yes  Risk Factor Category  High Fall Risk -  Follow up Falls evaluation completed;Falls prevention discussed -    Is the patient's home free of loose throw rugs in walkways, pet beds, electrical cords, etc?   no      Grab bars in the bathroom? no      Handrails on the stairs?   no      Adequate lighting?   yes  Timed Get Up and Go Performed Completed in 12 seconds with no use of assistive devices, unsteady gait. Informed PCP.   Discussed fall prevention, and picking up any lose area rugs.  Cognitive Function:     6CIT Screen 04/25/2017  What Year? 0 points  What month? 0 points  What time? 0 points   Count back from 20 0 points  Months in reverse 0 points  Repeat phrase 0 points  Total Score 0    Screening Tests Health Maintenance  Topic Date Due  . HIV Screening  08/14/1972  . TETANUS/TDAP  08/14/1976  . COLONOSCOPY  08/15/2007  . PAP SMEAR  03/18/2009  . INFLUENZA VACCINE  08/02/2017  . MAMMOGRAM  06/17/2018  . Hepatitis C Screening  Completed    Qualifies for Shingles Vaccine? Yes, discussed shingrix vaccine   Cancer Screenings: Lung: Low Dose CT Chest recommended if Age 21-80 years, 30 pack-year currently smoking OR have quit w/in 15years. Patient does qualify. Breast: Up to date on Mammogram? Yes  06/16/2016 Up to date of Bone Density/Dexa? No not due until age 73 Colorectal: referral sent to GI  Additional Screenings:  Hepatitis C Screening: completed 06/02/2016     Plan:    I have personally reviewed and addressed the Medicare Annual Wellness questionnaire and have noted the following in the patient's chart:  A. Medical and social history B. Use of alcohol, tobacco or illicit drugs  C. Current medications and supplements D. Functional ability and status E.  Nutritional status F.  Physical activity G. Advance directives H. List of other physicians I.  Hospitalizations, surgeries, and ER visits in previous 12 months J.  [place] such as hearing and vision if needed, cognitive and depression L. Referrals and appointments   In addition, I have reviewed and discussed with patient certain preventive protocols, quality metrics, and best practice recommendations. A written personalized care plan for preventive services as well as general preventive health recommendations were provided to patient.  Signed,  Tyler Aas, LPN Nurse Health Advisor   Nurse Notes:none

## 2017-04-26 ENCOUNTER — Telehealth: Payer: Self-pay | Admitting: *Deleted

## 2017-04-26 ENCOUNTER — Telehealth: Payer: Self-pay | Admitting: Family Medicine

## 2017-04-26 DIAGNOSIS — Z122 Encounter for screening for malignant neoplasm of respiratory organs: Secondary | ICD-10-CM

## 2017-04-26 DIAGNOSIS — Z87891 Personal history of nicotine dependence: Secondary | ICD-10-CM

## 2017-04-26 LAB — CBC WITH DIFFERENTIAL/PLATELET
BASOS ABS: 0 10*3/uL (ref 0.0–0.2)
Basos: 0 %
EOS (ABSOLUTE): 0.2 10*3/uL (ref 0.0–0.4)
Eos: 2 %
Hematocrit: 39.6 % (ref 34.0–46.6)
Hemoglobin: 12.8 g/dL (ref 11.1–15.9)
Immature Grans (Abs): 0 10*3/uL (ref 0.0–0.1)
Immature Granulocytes: 0 %
LYMPHS ABS: 2 10*3/uL (ref 0.7–3.1)
Lymphs: 17 %
MCH: 28.8 pg (ref 26.6–33.0)
MCHC: 32.3 g/dL (ref 31.5–35.7)
MCV: 89 fL (ref 79–97)
MONOCYTES: 5 %
MONOS ABS: 0.6 10*3/uL (ref 0.1–0.9)
NEUTROS ABS: 9.2 10*3/uL — AB (ref 1.4–7.0)
Neutrophils: 76 %
Platelets: 340 10*3/uL (ref 150–379)
RBC: 4.44 x10E6/uL (ref 3.77–5.28)
RDW: 16.3 % — AB (ref 12.3–15.4)
WBC: 12.1 10*3/uL — AB (ref 3.4–10.8)

## 2017-04-26 LAB — COMPREHENSIVE METABOLIC PANEL
ALK PHOS: 103 IU/L (ref 39–117)
ALT: 9 IU/L (ref 0–32)
AST: 14 IU/L (ref 0–40)
Albumin/Globulin Ratio: 1.3 (ref 1.2–2.2)
Albumin: 4.2 g/dL (ref 3.5–5.5)
BUN / CREAT RATIO: 21 (ref 9–23)
BUN: 14 mg/dL (ref 6–24)
CHLORIDE: 101 mmol/L (ref 96–106)
CO2: 22 mmol/L (ref 20–29)
Calcium: 9.2 mg/dL (ref 8.7–10.2)
Creatinine, Ser: 0.67 mg/dL (ref 0.57–1.00)
GFR calc Af Amer: 111 mL/min/{1.73_m2} (ref >59)
GFR calc non Af Amer: 97 mL/min/{1.73_m2} (ref >59)
GLOBULIN, TOTAL: 3.3 g/dL (ref 1.5–4.5)
GLUCOSE: 85 mg/dL (ref 65–99)
Potassium: 3.8 mmol/L (ref 3.5–5.2)
SODIUM: 140 mmol/L (ref 134–144)
Total Protein: 7.5 g/dL (ref 6.0–8.5)

## 2017-04-26 LAB — HIV ANTIBODY (ROUTINE TESTING W REFLEX): HIV SCREEN 4TH GENERATION: NONREACTIVE

## 2017-04-26 NOTE — Telephone Encounter (Signed)
Patient notified

## 2017-04-26 NOTE — Telephone Encounter (Signed)
Copied from [place] #[id number]. Topic: Quick Communication - See Telephone Encounter >> Apr 26, 2017  1:31 PM Cleaster Corin, [place] wrote: CRM for notification. See Telephone encounter for: 04/26/17. Pt. Calling receive lab results

## 2017-04-26 NOTE — Telephone Encounter (Signed)
Please call and let her know her labs were normal other than a persistently mildly elevated WBC count and neutrophils. Looks like she's seeing Dr. Grayland Ormond 5/1 so she can mention this to him and see if he wants to do any further evaluation for this

## 2017-04-26 NOTE — Telephone Encounter (Signed)
Received referral for low dose lung cancer screening CT scan. Message left at phone number listed in EMR for patient to call me back to facilitate scheduling scan.  

## 2017-04-26 NOTE — Telephone Encounter (Signed)
Received referral for initial lung cancer screening scan. Contacted patient and obtained smoking history,(current, 31.25 pack year) as well as answering questions related to screening process. Patient denies signs of lung cancer such as weight loss or hemoptysis. Patient denies comorbidity that would prevent curative treatment if lung cancer were found. Patient is scheduled for shared decision making visit and CT scan on 05/15/17.

## 2017-04-27 ENCOUNTER — Telehealth: Payer: Self-pay | Admitting: Family Medicine

## 2017-04-27 MED ORDER — FUROSEMIDE 40 MG PO TABS: 40.0000 mg | ORAL_TABLET | Freq: Every day | ORAL | 3 refills | Status: DC

## 2017-04-27 NOTE — Telephone Encounter (Signed)
Pt has been dismissed after the end of the month, she is currently still under our care for the 33 days after letter receipt. Her kidney function looked excellent so I am ok increasing her lasix dose and will get that sent over.   As far as the CBC, her WBCs and neutrophils have been elevated both 3 months ago and the other day, so she should follow up about this with her Hematologist/oncologist to see if further eval is necessary

## 2017-04-27 NOTE — Telephone Encounter (Signed)
Copied from [place]. Topic: Quick Communication - See Telephone Encounter >> Apr 27, 2017 10:30 AM Neva Seat wrote: Pt is needing to know if R. Orene Desanctis knows about her kidney issues and if she will increase the dosage for her fluid pills. Pt is also not understanding her labs.  She has some questions on them as well. Please call pt back asap.

## 2017-04-27 NOTE — Telephone Encounter (Signed)
Apolonio Schneiders, this patient is now labeled as dismissed.  I only see the letter being sent 03/19/2017. Then you gave patient another chance.  Was there another letter supposed to be sent?  And please advise message below.

## 2017-04-27 NOTE — Telephone Encounter (Signed)
Patient notified. Patient asked about cancer. Explained she needed to F/U with Dr. Grayland Ormond regarding those results. He can review them and patient has an appointment May 1st.  Patient also asked about her urine, explained her urine result came back normal.  Routing to provider as Juluis Rainier

## 2017-04-28 NOTE — Assessment & Plan Note (Signed)
New Rheumatology referral placed per pt request. Continue flexeril and ibuprofen for pain relief as needed

## 2017-04-28 NOTE — Assessment & Plan Note (Signed)
Not under great control given grief situation, but pt wanting to continue prozac and trazodone for now.

## 2017-04-28 NOTE — Assessment & Plan Note (Signed)
Still under poor control due to grief from the loss of her mother. Continue xanax prn, pt aware this is only for the next month or so as we had agreed originally on this being very short term just for acute grief.

## 2017-04-28 NOTE — Assessment & Plan Note (Signed)
Continue trazodone prn for sleep as well as good sleep hygiene tactics

## 2017-04-28 NOTE — Assessment & Plan Note (Addendum)
Stable and WNL, pt requesting to increase her lasix to 40 mg to help reduce her generalized edema - will check electrolytes and renal function and let her know if this change is appropriate. Counseled on watching for hypotension and dehydration if dose is increased

## 2017-04-29 NOTE — Progress Notes (Signed)
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  INTERVAL HISTORY: Patient returns to clinic today for routine yearly follow-up.  She has some increased anxiety and depression since the death of her mother several months ago.  She also states she is having significant back pain secondary to her ankylosing spondylitis. She continues to have high anxiety and multiple social issues.  She denies any chest pain, shortness of breath, or hemoptysis. She denies any abdominal pain. She denies any nausea, vomiting, constipation, or diarrhea. She has no urinary complaints. She denies any easy bleeding or bruising.  Patient feels generally terrible, but offers no further specific complaints.  REVIEW OF SYSTEMS:   Review of Systems  Constitutional: Negative for fever, malaise/fatigue and weight loss.  Respiratory: Negative.  Negative for cough and shortness of breath.   Cardiovascular: Negative.  Negative for chest pain and leg swelling.  Gastrointestinal: Negative.  Negative for abdominal pain.  Genitourinary: Negative.  Negative for dysuria.  Musculoskeletal: Positive for back pain and joint pain.  Neurological: Negative.  Negative for sensory change, focal weakness and weakness.  Psychiatric/Behavioral: Positive for depression. The patient is nervous/anxious.     As per HPI. Otherwise, a complete review of systems is negative.  PAST MEDICAL HISTORY: Past Medical History:  Diagnosis Date  . Ankylosing spondylitis ([place])   . Anxiety   . Arthritis   . Blood in stool   . Breast cancer ([place]) 2010   Left breast- chemo/radiation  . Chronic low back pain   . Chronic pain   . Depression   . Headache   . Hepatitis C 2007  .  Hip fracture ([place])   . History of blood transfusion   . Hypertension   . Insomnia   . OA (osteoarthritis)   . Osteoporosis   . Spinal stenosis of lumbar region   . Spondyloarthritis     PAST SURGICAL HISTORY: Past Surgical History:  Procedure Laterality Date  . BREAST BIOPSY Right 1983   neg  . BREAST EXCISIONAL BIOPSY Left 2010  . BREAST LUMPECTOMY Left 2010  . FEMUR FRACTURE SURGERY    . FOOT SURGERY  2000 and 2003  . INTRAMEDULLARY (IM) NAIL INTERTROCHANTERIC Right 01/12/2015   Procedure: INTRAMEDULLARY (IM) NAIL INTERTROCHANTRIC;  Surgeon: Earnestine Leys, MD;  Location: [facility] ORS;  Service: Orthopedics;  Laterality: Right;  . KNEE SURGERY Left 2004  . TUBAL LIGATION      FAMILY HISTORY Family History  Problem Relation Age of Onset  . Arthritis Mother   . Stroke Mother   . Heart disease Mother   . Hypertension Mother   . Hyperlipidemia Mother   . Depression Mother   . Seizures Mother   . Osteoporosis Mother   . Hypertension Father   . Hypertension Brother   . Arthritis Brother   . Depression Brother   . Diabetes Brother   . Hyperlipidemia Brother   . Heart disease Maternal Aunt   . Seizures Sister   . Stroke Sister   . Breast cancer Neg Hx   . COPD Neg Hx   . Cancer Neg Hx        ADVANCED DIRECTIVES:    HEALTH MAINTENANCE: Social History   Tobacco Use  . Smoking status: Current Every Day Smoker  Packs/day: 0.50    Types: Cigarettes  . Smokeless tobacco: Never Used  Substance Use Topics  . Alcohol use: Yes    Comment: occasionally a glass of wine cooler  . Drug use: Yes    Types: Marijuana    Comment: occasionally      Colonoscopy:  PAP:  Bone density:  Lipid panel:  Allergies  Allergen Reactions  . Tramadol Nausea Only  . Penicillins Rash and Other (See Comments)    Has patient had a PCN reaction causing immediate rash, facial/tongue/throat swelling, SOB or lightheadedness with hypotension: Yes Has patient had a PCN reaction causing  severe rash involving mucus membranes or skin necrosis: No Has patient had a PCN reaction that required hospitalization No Has patient had a PCN reaction occurring within the last 10 years: No If all of the above answers are "NO", then may proceed with Cephalosporin use.     Current Outpatient Medications  Medication Sig Dispense Refill  . ALPRAZolam (XANAX) 1 MG tablet Take 1 tablet (1 mg total) by mouth 2 (two) times daily as needed for anxiety. 60 tablet 0  . cyclobenzaprine (FLEXERIL) 10 MG tablet Take 1 tablet (10 mg total) by mouth 3 (three) times daily as needed for muscle spasms. 90 tablet 2  . famotidine (PEPCID) 40 MG tablet Take 1 tablet (40 mg total) by mouth every evening. 30 tablet 1  . FLUoxetine (PROZAC) 40 MG capsule Take 1 capsule (40 mg total) by mouth daily. 30 capsule 0  . furosemide (LASIX) 40 MG tablet Take 1 tablet (40 mg total) by mouth daily. 30 tablet 3  . IBU 800 MG tablet Take 1 tablet (800 mg total) by mouth 3 (three) times daily as needed. 90 tablet 1  . traZODone (DESYREL) 150 MG tablet Take 1 tablet (150 mg total) by mouth at bedtime as needed for sleep. 90 tablet 1   No current facility-administered medications for this visit.     OBJECTIVE: Vitals:   05/02/17 1100  BP: (!) 129/91  Pulse: 90  Resp: 18  Temp: (!) 96.1 F (35.6 C)     There is no height or weight on file to calculate BMI.    ECOG FS:0 - Asymptomatic  General: Well-developed, well-nourished, no acute distress. Eyes: Pink conjunctiva, anicteric sclera. Breast: Patient declined breast exam today. Lungs: Clear to auscultation bilaterally. Heart: Regular rate and rhythm. No rubs, murmurs, or gallops. Abdomen: Soft, nontender, nondistended. No organomegaly noted, normoactive bowel sounds. Musculoskeletal: No edema, cyanosis, or clubbing. Neuro: Alert, answering all questions appropriately. Cranial nerves grossly intact. Skin: No rashes or petechiae noted. Psych: Normal affect.  LAB  RESULTS:  Lab Results  Component Value Date   NA 140 04/25/2017   K 3.8 04/25/2017   CL 101 04/25/2017   CO2 22 04/25/2017   GLUCOSE 85 04/25/2017   BUN 14 04/25/2017   CREATININE 0.67 04/25/2017   CALCIUM 9.2 04/25/2017   PROT 7.5 04/25/2017   ALBUMIN 4.2 04/25/2017   AST 14 04/25/2017   ALT 9 04/25/2017   ALKPHOS 103 04/25/2017   BILITOT <0.2 04/25/2017   GFRNONAA 97 04/25/2017   GFRAA 111 04/25/2017    Lab Results  Component Value Date   WBC 12.1 (H) 04/25/2017   NEUTROABS 9.2 (H) 04/25/2017   HGB 12.8 04/25/2017   HCT 39.6 04/25/2017   MCV 89 04/25/2017   PLT 340 04/25/2017   Lab Results  Component Value Date   LABCA2 31.3 03/07/2016     STUDIES: No  results found.  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  PLAN:    1.  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 Patient completed all of her chemotherapy in December 2010 and her adjuvant XRT in the Spring of 2011. Patient's most recent mammogram on June 16, 2016 was reported as BI-RADS 1.  Repeat in June 2019.  After which she likely can be discharged from clinic.  Return to clinic in 1 year for further evaluation 2.  Anxiety: Patient will no longer receive Xanax from this clinic. 3.  Ankylosing spondylitis: Treatment for her primary rheumatologist.  Patient should not receive narcotics from this clinic.  4.  Hepatitis C: Patient reports this has resolved with treatment. 5.  Previous suicide attempts: No refills on Xanax or narcotics as above. 6.  Pain: No narcotics or treatment from this clinic as above.  Patient has been instructed to call her rheumatologist for further evaluation.  Approximately 20 minutes was spent in discussion of which greater than 50% was consultation.  Patient expressed understanding and was in agreement with this plan. She also understands that She can call clinic at any time  with any questions, concerns, or complaints.    Lloyd Huger, MD   05/03/2017 8:26 PM

## 2017-05-01 ENCOUNTER — Other Ambulatory Visit: Payer: Self-pay | Admitting: *Deleted

## 2017-05-01 DIAGNOSIS — C50919 Malignant neoplasm of unspecified site of unspecified female breast: Secondary | ICD-10-CM

## 2017-05-01 NOTE — Progress Notes (Signed)
A 2

## 2017-05-02 ENCOUNTER — Inpatient Hospital Stay: Payer: Medicare Other | Attending: Oncology

## 2017-05-02 ENCOUNTER — Encounter: Payer: Self-pay | Admitting: Oncology

## 2017-05-02 ENCOUNTER — Inpatient Hospital Stay (HOSPITAL_BASED_OUTPATIENT_CLINIC_OR_DEPARTMENT_OTHER): Payer: Medicare Other | Admitting: Oncology

## 2017-05-02 VITALS — BP 129/91 | HR 90 | Temp 96.1°F | Resp 18

## 2017-05-02 DIAGNOSIS — G8929 Other chronic pain: Secondary | ICD-10-CM

## 2017-05-02 DIAGNOSIS — Z9221 Personal history of antineoplastic chemotherapy: Secondary | ICD-10-CM | POA: Diagnosis not present

## 2017-05-02 DIAGNOSIS — F17210 Nicotine dependence, cigarettes, uncomplicated: Secondary | ICD-10-CM

## 2017-05-02 DIAGNOSIS — Z853 Personal history of malignant neoplasm of breast: Secondary | ICD-10-CM

## 2017-05-02 DIAGNOSIS — Z79899 Other long term (current) drug therapy: Secondary | ICD-10-CM | POA: Diagnosis not present

## 2017-05-02 DIAGNOSIS — C50912 Malignant neoplasm of unspecified site of left female breast: Secondary | ICD-10-CM

## 2017-05-02 DIAGNOSIS — F329 Major depressive disorder, single episode, unspecified: Secondary | ICD-10-CM

## 2017-05-02 DIAGNOSIS — F419 Anxiety disorder, unspecified: Secondary | ICD-10-CM | POA: Insufficient documentation

## 2017-05-02 DIAGNOSIS — Z171 Estrogen receptor negative status [ER-]: Principal | ICD-10-CM

## 2017-05-02 DIAGNOSIS — C50919 Malignant neoplasm of unspecified site of unspecified female breast: Secondary | ICD-10-CM

## 2017-05-02 NOTE — Progress Notes (Signed)
Patient here today for follow up regarding breast cancer. Patient reports worsening pain related to ankylosing spondylitis, rates pain "11" today. Patient states she does not have current f/u with rheumatology.

## 2017-05-03 LAB — CANCER ANTIGEN 27.29: CA 27.29: 29.4 U/mL (ref 0.0–38.6)

## 2017-05-08 ENCOUNTER — Telehealth: Payer: Self-pay | Admitting: Unknown Physician Specialty

## 2017-05-08 DIAGNOSIS — M457 Ankylosing spondylitis of lumbosacral region: Secondary | ICD-10-CM

## 2017-05-08 NOTE — Telephone Encounter (Signed)
Copied from [place] [id number]. Topic: Referral - Request >> May 07, 2017  4:36 PM [place], Laban Emperor wrote: Reason for CRM: Pt would like a referral to see a rheumatologist in [facility].  >> May 08, 2017  9:47 AM Sandria Manly, CMA wrote: Routing to directed provider Kathrine Haddock per last name due to Rachel's absence.   Patient requesting referral to rheumatology in [facility]

## 2017-05-15 ENCOUNTER — Inpatient Hospital Stay: Payer: Medicare Other | Admitting: Oncology

## 2017-05-15 ENCOUNTER — Ambulatory Visit: Admission: RE | Admit: 2017-05-15 | Payer: Medicare Other | Source: Ambulatory Visit

## 2017-05-15 ENCOUNTER — Encounter: Payer: Self-pay | Admitting: Oncology

## 2017-05-17 ENCOUNTER — Other Ambulatory Visit: Payer: Self-pay | Admitting: Family Medicine

## 2017-05-17 NOTE — Telephone Encounter (Signed)
LOV 04/25/17  Merrie Roof Last refill  04/18/17   #[id number]  0 refill

## 2017-05-17 NOTE — Telephone Encounter (Signed)
Copied from [place]. Topic: Quick Communication - Rx Refill/Question >> May 17, 2017  2:07 PM Marin Olp L wrote: Medication: ALPRAZolam Duanne Moron) 1 MG tablet Has the patient contacted their pharmacy? Yes.   (Agent: If no, request that the patient contact the pharmacy for the refill.) Preferred Pharmacy (with phone number or street name): [facility], [place] [address] Phone: [phone] Fax: [phone] Not a 24 hour pharmacy; exact hours not known Agent: Please be advised that RX refills may take up to 3 business days. We ask that you follow-up with your pharmacy.

## 2017-05-21 ENCOUNTER — Telehealth: Payer: Self-pay | Admitting: Family Medicine

## 2017-05-21 NOTE — Telephone Encounter (Signed)
Patient has been dismissed from this practice. Will not be getting a refill.

## 2017-05-21 NOTE — Telephone Encounter (Signed)
Copied from [place] [id number]. Topic: Quick Communication - Rx Refill/Question >> May 21, 2017 12:28 PM Ether Griffins B wrote: Medication: ALPRAZolam Duanne Moron) 1 MG tablet   Has the patient contacted their pharmacy? Yes.   (Agent: If no, request that the patient contact the pharmacy for the refill.) (Agent: If yes, when and what did the pharmacy advise?)  Preferred Pharmacy (with phone number or street name): [facility], [place]  Agent: Please be advised that RX refills may take up to 3 business days. We ask that you follow-up with your pharmacy.

## 2017-05-21 NOTE — Telephone Encounter (Signed)
Spoke with this pt and she stated that she was told to disregard her dismissal letter, please advise.

## 2017-05-21 NOTE — Telephone Encounter (Signed)
Copied from [place]. Topic: Quick Communication - Rx Refill/Question >> May 17, 2017  2:07 PM Marin Olp L wrote: Medication: ALPRAZolam Duanne Moron) 1 MG tablet Has the patient contacted their pharmacy? Yes.   (Agent: If no, request that the patient contact the pharmacy for the refill.) Preferred Pharmacy (with phone number or street name): [facility], [place] [address] Phone: [phone] Fax: [phone] Not a 24 hour pharmacy; exact hours not known Agent: Please be advised that RX refills may take up to 3 business days. We ask that you follow-up with your pharmacy.  >> May 21, 2017 10:10 AM Percell Belt A wrote: Pt called in to check on the status of this refill >> May 21, 2017 10:15 AM Shaune Pollack wrote: Please advise regarding patients alprazolam. Will patient need an appointment to obtain.  Thank you

## 2017-05-22 NOTE — Telephone Encounter (Signed)
Please ask patient who told her to disregard the letter.  Also advise that to our knowledge the termination is still valid.

## 2017-05-23 ENCOUNTER — Telehealth: Payer: Self-pay | Admitting: *Deleted

## 2017-05-23 DIAGNOSIS — Z87891 Personal history of nicotine dependence: Secondary | ICD-10-CM

## 2017-05-23 DIAGNOSIS — Z122 Encounter for screening for malignant neoplasm of respiratory organs: Secondary | ICD-10-CM

## 2017-05-23 NOTE — Telephone Encounter (Signed)
Called pt, no answer and unable to leave message

## 2017-05-23 NOTE — Telephone Encounter (Signed)
Contacted patient to reschedule lung screening scan. Please see eligibility from prior documentation. Patient will have shared decision making visit and CT on 06/18/17

## 2017-05-23 NOTE — Telephone Encounter (Signed)
Spoke with pt and informed her that she was dismissed and she would need to find a new pcp.

## 2017-06-03 ENCOUNTER — Emergency Department: Payer: Medicare Other

## 2017-06-03 ENCOUNTER — Encounter: Payer: Self-pay | Admitting: Emergency Medicine

## 2017-06-03 ENCOUNTER — Emergency Department
Admission: EM | Admit: 2017-06-03 | Discharge: 2017-06-03 | Disposition: A | Discharge status: Home / Self Care | Payer: Medicare Other | Attending: Emergency Medicine | Admitting: Emergency Medicine

## 2017-06-03 DIAGNOSIS — M79601 Pain in right arm: Secondary | ICD-10-CM | POA: Diagnosis not present

## 2017-06-03 DIAGNOSIS — I10 Essential (primary) hypertension: Secondary | ICD-10-CM | POA: Diagnosis not present

## 2017-06-03 DIAGNOSIS — F17210 Nicotine dependence, cigarettes, uncomplicated: Secondary | ICD-10-CM | POA: Diagnosis not present

## 2017-06-03 DIAGNOSIS — Z79899 Other long term (current) drug therapy: Secondary | ICD-10-CM | POA: Insufficient documentation

## 2017-06-03 DIAGNOSIS — R600 Localized edema: Secondary | ICD-10-CM | POA: Diagnosis not present

## 2017-06-03 DIAGNOSIS — R609 Edema, unspecified: Secondary | ICD-10-CM

## 2017-06-03 MED ORDER — HYDROCODONE-ACETAMINOPHEN 5-325 MG PO TABS: 1.0000 | ORAL_TABLET | Freq: Four times a day (QID) | ORAL | 0 refills | Status: AC | PRN

## 2017-06-03 NOTE — ED Provider Notes (Signed)
[facility] Emergency Department Provider Note ____________________________________________  Time seen: Approximately 1:07 PM  I have reviewed the triage vital signs and the nursing notes.   HISTORY  Chief Complaint Arm Pain    HPI Janet Zuniga is a 62 y.o. female who presents to the emergency department for evaluation and treatment of right arm pain that has been present for the past 2 weeks. She fell 2 weeks ago, but doesn't believe she hurt her arm. She has had diffuse swelling from the elbow to the hand.   Past Medical History:  Diagnosis Date  . Ankylosing spondylitis ([place])   . Anxiety   . Arthritis   . Blood in stool   . Breast cancer ([place]) 2010   Left breast- chemo/radiation  . Chronic low back pain   . Chronic pain   . Depression   . Headache   . Hepatitis C 2007  . Hip fracture ([place])   . History of blood transfusion   . Hypertension   . Insomnia   . OA (osteoarthritis)   . Osteoporosis   . Spinal stenosis of lumbar region   . Spondyloarthritis     Patient Active Problem List   Diagnosis Date Noted  . Degenerative disc disease, lumbar 09/20/2016  . Shoulder joint pain 09/18/2016  . Hypertension   . Ankylosing spondylitis ([place])   . OA (osteoarthritis)   . Osteoporosis   . Malignant neoplasm of left female breast ([place]) 09/12/2015  . Cocaine abuse ([place]) 08/18/2015  . Alcohol abuse 08/03/2015  . Chronic use of opiate drugs therapeutic purposes 05/19/2015  . Leg reflex sympathetic dystrophy, right 05/19/2015  . Fracture of shaft of femur ([place]) 01/12/2015  . Anxiety 01/12/2015  . Anxiety and depression 12/24/2014  . Chronic bilateral low back pain with bilateral sciatica 12/24/2014  . Substance induced mood disorder ([place]) 10/27/2014  . Chronic pain in right foot 10/20/2014  . Insomnia 06/05/2014  . Chronic pain syndrome 05/15/2014  . Raynaud's phenomenon without gangrene 05/15/2014  . Spinal stenosis of lumbar region 05/15/2014   . 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 Generalized anxiety disorder 05/04/2014  . Hepatitis C 03/12/2014  . Midline low back pain without sciatica 03/12/2014  . History of breast cancer in female 12/23/2013  . Chronic pain 12/23/2013  . Ankylosing spondylitis of lumbosacral region ([place]) 12/23/2013  . Depression 12/23/2013  . Osteoarthritis 10/05/2011    Past Surgical History:  Procedure Laterality Date  . BREAST BIOPSY Right 1983   neg  . BREAST EXCISIONAL BIOPSY Left 2010  . BREAST LUMPECTOMY Left 2010  . FEMUR FRACTURE SURGERY    . FOOT SURGERY  2000 and 2003  . INTRAMEDULLARY (IM) NAIL INTERTROCHANTERIC Right 01/12/2015   Procedure: INTRAMEDULLARY (IM) NAIL INTERTROCHANTRIC;  Surgeon: Earnestine Leys, MD;  Location: [facility] ORS;  Service: Orthopedics;  Laterality: Right;  . KNEE SURGERY Left 2004  . TUBAL LIGATION      Prior to Admission medications   Medication Sig Start Date End Date Taking? Authorizing Provider  ALPRAZolam Duanne Moron) 1 MG tablet Take 1 tablet (1 mg total) by mouth 2 (two) times daily as needed for anxiety. 04/18/17   Volney American, PA-C  cyclobenzaprine (FLEXERIL) 10 MG tablet Take 1 tablet (10 mg total) by mouth 3 (three) times daily as needed for muscle spasms. 03/19/17   Volney American, PA-C  famotidine (PEPCID) 40 MG tablet Take 1 tablet (40 mg total) by mouth every evening. 06/12/16 06/12/17  Nance Pear, MD  FLUoxetine [facility])  40 MG capsule Take 1 capsule (40 mg total) by mouth daily. 04/25/17   Volney American, PA-C  furosemide (LASIX) 40 MG tablet Take 1 tablet (40 mg total) by mouth daily. 04/27/17   Volney American, PA-C  HYDROcodone-acetaminophen (NORCO/VICODIN) 5-325 MG tablet Take 1 tablet by mouth every 6 (six) hours as needed for up to 3 days for severe pain. 06/03/17 06/06/17  Alyxandria Wentz, Johnette Abraham B, FNP  IBU 800 MG tablet Take 1 tablet (800 mg total) by mouth 3 (three) times daily as needed. 03/21/17   Volney American, PA-C   traZODone (DESYREL) 150 MG tablet Take 1 tablet (150 mg total) by mouth at bedtime as needed for sleep. 04/25/17   Volney American, PA-C    Allergies Tramadol and Penicillins  Family History  Problem Relation Age of Onset  . Arthritis Mother   . Stroke Mother   . Heart disease Mother   . Hypertension Mother   . Hyperlipidemia Mother   . Depression Mother   . Seizures Mother   . Osteoporosis Mother   . Hypertension Father   . Hypertension Brother   . Arthritis Brother   . Depression Brother   . Diabetes Brother   . Hyperlipidemia Brother   . Heart disease Maternal Aunt   . Seizures Sister   . Stroke Sister   . Breast cancer Neg Hx   . COPD Neg Hx   . Cancer Neg Hx     Social History Social History   Tobacco Use  . Smoking status: Current Every Day Smoker    Packs/day: 1.25    Years: 25.00    Pack years: 31.25    Types: Cigarettes  . Smokeless tobacco: Never Used  Substance Use Topics  . Alcohol use: Yes    Comment: occasionally a glass of wine cooler  . Drug use: Yes    Types: Marijuana    Comment: occasionally     Review of Systems Constitutional: Negative for fever. Cardiovascular: Negative for chest pain. Respiratory: Negative for shortness of breath. Musculoskeletal: Positive for right arm pain.  Skin: Positive for peripheral edema of the right arm.  Neurological: Negative for decrease in sensation  ____________________________________________   PHYSICAL EXAM:  VITAL SIGNS: ED Triage Vitals  Enc Vitals Group     BP 06/03/17 1306 (!) 131/111     Pulse Rate 06/03/17 1306 95     Resp 06/03/17 1306 18     Temp 06/03/17 1306 98.7 F (37.1 C)     Temp Source 06/03/17 1306 Oral     SpO2 06/03/17 1306 96 %     Weight 06/03/17 1254 160 lb (72.6 kg)     Height 06/03/17 1254 5\' 5"  (1.651 m)     Head Circumference --      Peak Flow --      Pain Score 06/03/17 1254 9     Pain Loc --      Pain Edu? --      Excl. in [place]? --      Constitutional: Alert and oriented. Well appearing and in no acute distress. Eyes: Conjunctivae are clear without discharge or drainage Head: Atraumatic Neck: Supple Respiratory: No cough. Respirations are even and unlabored. Musculoskeletal: Right shoulder diffusely tender with abduction. No elbow pain or restriction of motion. Diffuse tenderness of the right forearm, wrist, and hand. Neurologic: Grip strength on right weaker than left.  Skin: Nonpitting edema of the right forearm and hand   Psychiatric: Affect and behavior  are appropriate.  ____________________________________________   LABS (all labs ordered are listed, but only abnormal results are displayed)  Labs Reviewed - No data to display ____________________________________________  RADIOLOGY  X-ray of the right forearm and hand are negative for bony abnormality per radiology. ____________________________________________   PROCEDURES  Procedures  ____________________________________________   INITIAL IMPRESSION / ASSESSMENT AND PLAN / ED COURSE  KAFI DOTTER is a 62 y.o. who presents to the emergency department for evaluation and treatment of right arm pain without specific injury. X-rays are negative. [place] requested to rule out DVT/phlebitis.  No explanation for patient's pain. She is to follow up with her PCP on June 11 as scheduled. She is to return to the ER for symptoms that change or worsen if unable to see her PCP sooner.  Medications - No data to display  Pertinent labs & imaging results that were available during my care of the patient were reviewed by me and considered in my medical decision making (see chart for details).  _________________________________________   FINAL CLINICAL IMPRESSION(S) / ED DIAGNOSES  Final diagnoses:  Peripheral edema  Right arm pain    ED Discharge Orders        Ordered    HYDROcodone-acetaminophen (NORCO/VICODIN) 5-325 MG tablet  Every 6 hours PRN      06/03/17 1540       If controlled substance prescribed during this visit, 12 month history viewed on the [place] prior to issuing an initial prescription for Schedule II or III opiod.    Victorino Dike, FNP 06/03/17 1618    Schuyler Amor, MD 06/05/17 [phone]

## 2017-06-03 NOTE — ED Notes (Signed)
Pt reports that she has been taking 800mg  ibuprofen 3 times a day for her arthritis pain and that it is not relieving pain.  Pt states that she feels like this is her arthritis acting up and that she typically needs steroids to get rid of the pain.  Pt is A&Ox4, in NAD.

## 2017-06-03 NOTE — ED Triage Notes (Signed)
Patient presents to the ED with right arm pain x 2 weeks.  Patient denies trauma.  Patient has history of arthritis.

## 2017-06-14 ENCOUNTER — Telehealth: Payer: Self-pay | Admitting: Nurse Practitioner

## 2017-06-18 ENCOUNTER — Inpatient Hospital Stay: Payer: Medicare Other | Attending: Nurse Practitioner | Admitting: Nurse Practitioner

## 2017-06-18 ENCOUNTER — Ambulatory Visit: Admission: RE | Admit: 2017-06-18 | Payer: Medicare Other | Source: Ambulatory Visit

## 2017-07-13 ENCOUNTER — Encounter: Payer: Self-pay | Admitting: *Deleted

## 2017-08-14 ENCOUNTER — Emergency Department: Payer: Medicare Other

## 2017-08-14 ENCOUNTER — Other Ambulatory Visit: Payer: Self-pay

## 2017-08-14 ENCOUNTER — Emergency Department
Admission: EM | Admit: 2017-08-14 | Discharge: 2017-08-14 | Disposition: A | Discharge status: Home / Self Care | Payer: Medicare Other | Attending: Emergency Medicine | Admitting: Emergency Medicine

## 2017-08-14 ENCOUNTER — Encounter: Payer: Self-pay | Admitting: Emergency Medicine

## 2017-08-14 DIAGNOSIS — I10 Essential (primary) hypertension: Secondary | ICD-10-CM | POA: Insufficient documentation

## 2017-08-14 DIAGNOSIS — Z79899 Other long term (current) drug therapy: Secondary | ICD-10-CM | POA: Diagnosis not present

## 2017-08-14 DIAGNOSIS — Z853 Personal history of malignant neoplasm of breast: Secondary | ICD-10-CM | POA: Diagnosis not present

## 2017-08-14 DIAGNOSIS — F17210 Nicotine dependence, cigarettes, uncomplicated: Secondary | ICD-10-CM | POA: Insufficient documentation

## 2017-08-14 DIAGNOSIS — J189 Pneumonia, unspecified organism: Secondary | ICD-10-CM | POA: Diagnosis not present

## 2017-08-14 DIAGNOSIS — R0789 Other chest pain: Secondary | ICD-10-CM | POA: Diagnosis present

## 2017-08-14 LAB — BASIC METABOLIC PANEL
Anion gap: 10 (ref 5–15)
BUN: 18 mg/dL (ref 6–20)
CALCIUM: 8.7 mg/dL — AB (ref 8.9–10.3)
CO2: 30 mmol/L (ref 22–32)
Chloride: 99 mmol/L (ref 98–111)
Creatinine, Ser: 0.77 mg/dL (ref 0.44–1.00)
GFR calc non Af Amer: >60 mL/min (ref >60)
Glucose, Bld: 148 mg/dL — ABNORMAL HIGH (ref 70–99)
Potassium: 3.1 mmol/L — ABNORMAL LOW (ref 3.5–5.1)
SODIUM: 139 mmol/L (ref 135–145)

## 2017-08-14 LAB — CBC
HCT: 37.9 % (ref 35.0–47.0)
Hemoglobin: 12.6 g/dL (ref 12.0–16.0)
MCH: 29.9 pg (ref 26.0–34.0)
MCHC: 33.2 g/dL (ref 32.0–36.0)
MCV: 90.1 fL (ref 80.0–100.0)
PLATELETS: 402 10*3/uL (ref 150–440)
RBC: 4.21 MIL/uL (ref 3.80–5.20)
RDW: 19.2 % — AB (ref 11.5–14.5)
WBC: 13.7 10*3/uL — AB (ref 3.6–11.0)

## 2017-08-14 LAB — TROPONIN I

## 2017-08-14 MED ORDER — ALPRAZOLAM 0.5 MG PO TABS
1.0000 mg | ORAL_TABLET | Freq: Once | ORAL | Status: AC
  Administered 2017-08-14: 1 mg via ORAL
  Filled 2017-08-14: qty 2

## 2017-08-14 MED ORDER — AZITHROMYCIN 250 MG PO TABS: 250.0000 mg | ORAL_TABLET | Freq: Every day | ORAL | 0 refills | Status: AC

## 2017-08-14 MED ORDER — AZITHROMYCIN 500 MG PO TABS
500.0000 mg | ORAL_TABLET | Freq: Once | ORAL | Status: AC
  Administered 2017-08-14: 500 mg via ORAL
  Filled 2017-08-14: qty 1

## 2017-08-14 NOTE — Discharge Instructions (Signed)
Please seek medical attention for any high fevers, chest pain, shortness of breath, change in behavior, persistent vomiting, bloody stool or any other new or concerning symptoms.  

## 2017-08-14 NOTE — ED Provider Notes (Signed)
[facility] Emergency Department Provider Note   ____________________________________________   I have reviewed the triage vital signs and the nursing notes.   HISTORY  Chief Complaint Chest Pain   History limited by: Not Limited   HPI Janet Zuniga is a 62 y.o. female who presents to the emergency department today because of concern for chest pain. The pain is located on the left side. Started today. The patient states that the pain has been fairly constant. The patient states the pain does go up under her arm pit. It is somewhat worse with movement. The patient states that she has not had any trauma or has had any unusual activity. The patient has not had any fevers.    Per medical record review patient has a history of chronic pain syndrome.  Past Medical History:  Diagnosis Date  . Ankylosing spondylitis ([address])   . Anxiety   . Arthritis   . Blood in stool   . Breast cancer ([place]) 2010   Left breast- chemo/radiation  . Chronic low back pain   . Chronic pain   . Depression   . Headache   . Hepatitis C 2007  . Hip fracture ([place])   . History of blood transfusion   . Hypertension   . Insomnia   . OA (osteoarthritis)   . Osteoporosis   . Spinal stenosis of lumbar region   . Spondyloarthritis     Patient Active Problem List   Diagnosis Date Noted  . Degenerative disc disease, lumbar 09/20/2016  . Shoulder joint pain 09/18/2016  . Hypertension   . Ankylosing spondylitis ([place])   . OA (osteoarthritis)   . Osteoporosis   . Malignant neoplasm of left female breast ([place]) 09/12/2015  . Cocaine abuse ([place]) 08/18/2015  . Alcohol abuse 08/03/2015  . Chronic use of opiate drugs therapeutic purposes 05/19/2015  . Leg reflex sympathetic dystrophy, right 05/19/2015  . Fracture of shaft of femur ([place]) 01/12/2015  . Anxiety 01/12/2015  . Anxiety and depression 12/24/2014  . Chronic bilateral low back pain with bilateral sciatica 12/24/2014  . Substance  induced mood disorder ([place]) 10/27/2014  . Chronic pain in right foot 10/20/2014  . Insomnia 06/05/2014  . Chronic pain syndrome 05/15/2014  . Raynaud's phenomenon without gangrene 05/15/2014  . Spinal stenosis of lumbar region 05/15/2014  . 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 Generalized anxiety disorder 05/04/2014  . Hepatitis C 03/12/2014  . Midline low back pain without sciatica 03/12/2014  . History of breast cancer in female 12/23/2013  . Chronic pain 12/23/2013  . Ankylosing spondylitis of lumbosacral region ([place]) 12/23/2013  . Depression 12/23/2013  . Osteoarthritis 10/05/2011    Past Surgical History:  Procedure Laterality Date  . BREAST BIOPSY Right 1983   neg  . BREAST EXCISIONAL BIOPSY Left 2010  . BREAST LUMPECTOMY Left 2010  . FEMUR FRACTURE SURGERY    . FOOT SURGERY  2000 and 2003  . INTRAMEDULLARY (IM) NAIL INTERTROCHANTERIC Right 01/12/2015   Procedure: INTRAMEDULLARY (IM) NAIL INTERTROCHANTRIC;  Surgeon: Earnestine Leys, MD;  Location: [facility] ORS;  Service: Orthopedics;  Laterality: Right;  . KNEE SURGERY Left 2004  . TUBAL LIGATION      Prior to Admission medications   Medication Sig Start Date End Date Taking? Authorizing Provider  ALPRAZolam Duanne Moron) 1 MG tablet Take 1 tablet (1 mg total) by mouth 2 (two) times daily as needed for anxiety. 04/18/17   Volney American, PA-C  cyclobenzaprine (FLEXERIL) 10 MG tablet Take 1 tablet (10  mg total) by mouth 3 (three) times daily as needed for muscle spasms. 03/19/17   Volney American, PA-C  famotidine (PEPCID) 40 MG tablet Take 1 tablet (40 mg total) by mouth every evening. 06/12/16 06/12/17  Nance Pear, MD  FLUoxetine (PROZAC) 40 MG capsule Take 1 capsule (40 mg total) by mouth daily. 04/25/17   Volney American, PA-C  furosemide (LASIX) 40 MG tablet Take 1 tablet (40 mg total) by mouth daily. 04/27/17   Volney American, PA-C  IBU 800 MG tablet Take 1 tablet (800 mg total) by mouth 3 (three) times  daily as needed. 03/21/17   Volney American, PA-C  traZODone (DESYREL) 150 MG tablet Take 1 tablet (150 mg total) by mouth at bedtime as needed for sleep. 04/25/17   Volney American, PA-C    Allergies Tramadol and Penicillins  Family History  Problem Relation Age of Onset  . Arthritis Mother   . Stroke Mother   . Heart disease Mother   . Hypertension Mother   . Hyperlipidemia Mother   . Depression Mother   . Seizures Mother   . Osteoporosis Mother   . Hypertension Father   . Hypertension Brother   . Arthritis Brother   . Depression Brother   . Diabetes Brother   . Hyperlipidemia Brother   . Heart disease Maternal Aunt   . Seizures Sister   . Stroke Sister   . Breast cancer Neg Hx   . COPD Neg Hx   . Cancer Neg Hx     Social History Social History   Tobacco Use  . Smoking status: Current Every Day Smoker    Packs/day: 1.00    Years: 25.00    Pack years: 25.00    Types: Cigarettes  . Smokeless tobacco: Never Used  Substance Use Topics  . Alcohol use: Yes    Comment: occasionally a glass of wine cooler  . Drug use: Yes    Types: Marijuana    Comment: occasionally     Review of Systems Constitutional: No fever/chills Eyes: No visual changes. ENT: No sore throat. Cardiovascular: Positive for left sided chest pain. Respiratory: Denies shortness of breath. Gastrointestinal: No abdominal pain.  No nausea, no vomiting.  No diarrhea.   Genitourinary: Negative for dysuria. Musculoskeletal: Negative for back pain. Skin: Negative for rash. Neurological: Negative for headaches, focal weakness or numbness.  ____________________________________________   PHYSICAL EXAM:  VITAL SIGNS: ED Triage Vitals [08/14/17 2042]  Enc Vitals Group     BP (!) 129/93     Pulse Rate 75     Resp 17     Temp 98.1 F (36.7 C)     Temp Source Oral     SpO2 95 %     Weight 155 lb (70.3 kg)     Height 5\' 1"  (1.549 m)   Constitutional: Alert and oriented.  Eyes:  Conjunctivae are normal.  ENT      Head: Normocephalic and atraumatic.      Nose: No congestion/rhinnorhea.      Mouth/Throat: Mucous membranes are moist.      Neck: No stridor. Hematological/Lymphatic/Immunilogical: No cervical lymphadenopathy. Cardiovascular: Normal rate, regular rhythm.  No murmurs, rubs, or gallops.  Respiratory: Normal respiratory effort without tachypnea nor retractions. Breath sounds are clear and equal bilaterally. No wheezes/rales/rhonchi. Gastrointestinal: Soft and non tender. No rebound. No guarding.  Genitourinary: Deferred Musculoskeletal: Normal range of motion in all extremities. No lower extremity edema. Neurologic:  Normal speech and language. No  gross focal neurologic deficits are appreciated.  Skin:  Skin is warm, dry and intact. No rash noted. Psychiatric: Mood and affect are normal. Speech and behavior are normal. Patient exhibits appropriate insight and judgment.  ____________________________________________    LABS (pertinent positives/negatives)  Trop <0.03 CBC wbc 13.7, hgb 12.6, plt 402 BMP na 139, k 3.1, glu 148, cr 0.77  ____________________________________________   EKG  I, Nance Pear, attending physician, personally viewed and interpreted this EKG  EKG Time: 2042 Rate: 75 Rhythm: sinus rhythm Axis: normal Intervals: short pr, qtc 509 QRS: narrow, q waves v1 ST changes: no st elevation Impression: abnormal ekg ____________________________________________    RADIOLOGY  CXR Atelectasis vs infiltrate in left lung  ____________________________________________   PROCEDURES  Procedures  ____________________________________________   INITIAL IMPRESSION / ASSESSMENT AND PLAN / ED COURSE  Pertinent labs & imaging results that were available during my care of the patient were reviewed by me and considered in my medical decision making (see chart for details).   Into the emergency department today because of  concerns for chest pain.  Differential would be broad including shingles, pneumonia, PE, pneumothorax, dissection, esophagitis amongst other etiologies.  Chest x-ray and mild leukocytosis is perhaps most suggestive of pneumonia.  Discussed this finding with patient.  Will give first dose of antibiotics here.  Will plan on discharge with further antibiotics.  ____________________________________________   FINAL CLINICAL IMPRESSION(S) / ED DIAGNOSES  Final diagnoses:  Community acquired pneumonia, unspecified laterality     Note: This dictation was prepared with Sales executive. Any transcriptional errors that result from this process are unintentional     Nance Pear, MD 08/14/17 2207

## 2017-08-14 NOTE — ED Triage Notes (Signed)
Patient coming from home via [facility] for chest pain. Was given 324 ASA and .04 nitro by EMS. Patient complains of sharp chest pain on right side with pain radiating down right side that started approximately 2 hours ago.

## 2017-08-17 ENCOUNTER — Telehealth: Payer: Self-pay | Admitting: *Deleted

## 2017-08-17 NOTE — Telephone Encounter (Signed)
Patient called reporting that she is having pain in her axilla and breast on her breast cancer side. States she is on antibiotics Zpak for pneumonia and takes her last one today. She also has a mammogram Monday. Declined appointment for today but asked for Monday or Tuesday.Appt given for Monday AM

## 2017-08-20 ENCOUNTER — Ambulatory Visit: Payer: Self-pay

## 2017-08-20 ENCOUNTER — Inpatient Hospital Stay: Payer: Medicare Other | Attending: Nurse Practitioner | Admitting: Oncology

## 2017-08-20 VITALS — BP 94/54 | HR 76 | Temp 96.5°F | Resp 18

## 2017-08-20 DIAGNOSIS — N644 Mastodynia: Secondary | ICD-10-CM | POA: Diagnosis not present

## 2017-08-20 DIAGNOSIS — G8929 Other chronic pain: Secondary | ICD-10-CM

## 2017-08-20 DIAGNOSIS — S40812A Abrasion of left upper arm, initial encounter: Secondary | ICD-10-CM

## 2017-08-20 DIAGNOSIS — L089 Local infection of the skin and subcutaneous tissue, unspecified: Secondary | ICD-10-CM

## 2017-08-20 DIAGNOSIS — Z853 Personal history of malignant neoplasm of breast: Secondary | ICD-10-CM | POA: Insufficient documentation

## 2017-08-20 DIAGNOSIS — F17210 Nicotine dependence, cigarettes, uncomplicated: Secondary | ICD-10-CM | POA: Insufficient documentation

## 2017-08-20 DIAGNOSIS — Z923 Personal history of irradiation: Secondary | ICD-10-CM | POA: Diagnosis not present

## 2017-08-20 DIAGNOSIS — Z9221 Personal history of antineoplastic chemotherapy: Secondary | ICD-10-CM

## 2017-08-20 DIAGNOSIS — R4182 Altered mental status, unspecified: Secondary | ICD-10-CM | POA: Diagnosis not present

## 2017-08-20 DIAGNOSIS — F419 Anxiety disorder, unspecified: Secondary | ICD-10-CM

## 2017-08-20 DIAGNOSIS — Z915 Personal history of self-harm: Secondary | ICD-10-CM | POA: Diagnosis not present

## 2017-08-20 DIAGNOSIS — Z79899 Other long term (current) drug therapy: Secondary | ICD-10-CM | POA: Insufficient documentation

## 2017-08-20 MED ORDER — DOXYCYCLINE HYCLATE 100 MG PO TABS: 100.0000 mg | ORAL_TABLET | Freq: Two times a day (BID) | ORAL | 0 refills | Status: DC

## 2017-08-20 NOTE — Progress Notes (Addendum)
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 Grayland Ormond on 05/02/2017 for routine yearly follow-up.  At that visit she complained of significant back pain secondary to her ankylosing spondylitis.  She also admitted to increased anxiety due to the death of her mother and social issues.  Reviewed mammogram from 06/2016 which was reported as BI-RADS 1-negative.  Requested anxiety and pain medication but given history of suicide attempts, she was referred back to her PCP and rheumatologist.  Evaluated in the emergency room on 08/14/2017 with complaints of left-sided chest pain that radiated to left axilla.  Had chest x-ray that revealed atelectasis versus infiltrate in left lung.  She was diagnosed with pneumonia given mild leukocytosis and discharged home with antibiotics.   Oncology History   Diagnosed with Triple neg breast cancer 2010.  Had lumpectomy and chemo 2010.   Radiation completed in 2011.      Malignant neoplasm of left female breast ([place])   09/12/2015 Initial Diagnosis    Malignant neoplasm of left female breast [facility])     Interval history-  Patient presents for evaluation of a cutaneous abscess. Lesion is located in the left axilla. Onset was 1 month ago. Symptoms have gradually worsened. Abscess has associated symptoms of spontaneous drainage, pain. Patient does not have previous history of cutaneous abscesses. Patient does not have diabetes.  Patient was evaluated in the emergency room with left chest pain radiating to axilla earlier  this month and diagnosed with pneumonia.  She was given antibiotics and sent home.  States " the abscess is present during this visit but no one looked at it".   ECOG FS:0 - Asymptomatic  Review of systems- Review of Systems  Constitutional: Positive for malaise/fatigue. Negative for chills, fever and weight loss.  HENT: Negative for congestion and ear pain.   Eyes: Negative.  Negative for blurred vision and double vision.  Respiratory: Negative.  Negative for cough, sputum production and shortness of breath.   Cardiovascular: Negative.  Negative for chest pain, palpitations and leg swelling.  Gastrointestinal: Negative.  Negative for abdominal pain, constipation, diarrhea, nausea and vomiting.  Genitourinary: Negative for dysuria, frequency and urgency.  Musculoskeletal: Positive for back pain, joint pain and myalgias. Negative for falls.  Skin: Negative.  Negative for rash.       Abscess left axilla-painful Tender left breast  Neurological: Negative.  Negative for weakness and headaches.  Endo/Heme/Allergies: Negative.  Does not bruise/bleed easily.  Psychiatric/Behavioral: Negative.  Negative for depression. The patient is not nervous/anxious and does not have insomnia.      Current treatment- surveillence  Allergies  Allergen Reactions  . Tramadol Nausea Only  . Penicillins Rash and Other (See Comments)    Has patient had a PCN reaction causing immediate rash, facial/tongue/throat swelling, SOB or lightheadedness with hypotension: Yes Has patient had a PCN reaction causing severe rash involving mucus membranes or skin necrosis: No Has patient had a PCN reaction that required hospitalization No Has patient had a PCN reaction occurring within the last 10 years: No If all of the above answers are "  NO", then may proceed with Cephalosporin use.      Past Medical History:  Diagnosis Date  . Ankylosing spondylitis ([place])   . Anxiety   . Arthritis   . Blood in stool   . Breast  cancer ([place]) 2010   Left breast- chemo/radiation  . Chronic low back pain   . Chronic pain   . Depression   . Headache   . Hepatitis C 2007  . Hip fracture ([place])   . History of blood transfusion   . Hypertension   . Insomnia   . OA (osteoarthritis)   . Osteoporosis   . Spinal stenosis of lumbar region   . Spondyloarthritis      Past Surgical History:  Procedure Laterality Date  . BREAST BIOPSY Right 1983   neg  . BREAST EXCISIONAL BIOPSY Left 2010  . BREAST LUMPECTOMY Left 2010  . FEMUR FRACTURE SURGERY    . FOOT SURGERY  2000 and 2003  . INTRAMEDULLARY (IM) NAIL INTERTROCHANTERIC Right 01/12/2015   Procedure: INTRAMEDULLARY (IM) NAIL INTERTROCHANTRIC;  Surgeon: Earnestine Leys, MD;  Location: [facility] ORS;  Service: Orthopedics;  Laterality: Right;  . KNEE SURGERY Left 2004  . TUBAL LIGATION      Social History   Socioeconomic History  . Marital status: Legally Separated    Spouse name: Not on file  . Number of children: Not on file  . Years of education: Not on file  . Highest education level: Not on file  Occupational History  . Not on file  Social Needs  . Financial resource strain: Not hard at all  . Food insecurity:    Worry: Never true    Inability: Never true  . Transportation needs:    Medical: Yes    Non-medical: Yes  Tobacco Use  . Smoking status: Current Every Day Smoker    Packs/day: 1.00    Years: 25.00    Pack years: 25.00    Types: Cigarettes  . Smokeless tobacco: Never Used  Substance and Sexual Activity  . Alcohol use: Yes    Comment: occasionally a glass of wine cooler  . Drug use: Yes    Types: Marijuana    Comment: occasionally   . Sexual activity: Not on file  Lifestyle  . Physical activity:    Days per week: 0 days    Minutes per session: 0 min  . Stress: Not at all  Relationships  . Social connections:    Talks on phone: More than three times a week    Gets together: Never    Attends religious service: Never    Active member  of club or organization: No    Attends meetings of clubs or organizations: Never    Relationship status: Separated  . Intimate partner violence:    Fear of current or ex partner: No    Emotionally abused: No    Physically abused: No    Forced sexual activity: No  Other Topics Concern  . Not on file  Social History Narrative  . Not on file    Family History  Problem Relation Age of Onset  . Arthritis Mother   . Stroke Mother   . Heart disease Mother   . Hypertension Mother   . Hyperlipidemia Mother   . Depression Mother   . Seizures Mother   . Osteoporosis Mother   . Hypertension Father   . Hypertension Brother   . Arthritis Brother   . Depression Brother   . Diabetes Brother   .  Hyperlipidemia Brother   . Heart disease Maternal Aunt   . Seizures Sister   . Stroke Sister   . Breast cancer Neg Hx   . COPD Neg Hx   . Cancer Neg Hx      Current Outpatient Medications:  .  ALPRAZolam (XANAX) 1 MG tablet, Take 1 tablet (1 mg total) by mouth 2 (two) times daily as needed for anxiety., Disp: 60 tablet, Rfl: 0 .  famotidine (PEPCID) 40 MG tablet, Take 1 tablet (40 mg total) by mouth every evening., Disp: 30 tablet, Rfl: 1 .  FLUoxetine (PROZAC) 40 MG capsule, Take 1 capsule (40 mg total) by mouth daily., Disp: 30 capsule, Rfl: 0 .  furosemide (LASIX) 40 MG tablet, Take 1 tablet (40 mg total) by mouth daily., Disp: 30 tablet, Rfl: 3 .  meloxicam (MOBIC) 15 MG tablet, Take 15 mg by mouth daily., Disp: , Rfl:  .  traZODone (DESYREL) 150 MG tablet, Take 1 tablet (150 mg total) by mouth at bedtime as needed for sleep., Disp: 90 tablet, Rfl: 1 .  doxycycline (VIBRA-TABS) 100 MG tablet, Take 1 tablet (100 mg total) by mouth 2 (two) times daily., Disp: 20 tablet, Rfl: 0  Physical exam:  Vitals:   08/20/17 1103  BP: (!) 94/54  Pulse: 76  Resp: 18  Temp: (!) 96.5 F (35.8 C)  TempSrc: Tympanic   Physical Exam  Constitutional: She is oriented to person, place, and time. Vital  signs are normal. She appears well-developed and well-nourished.  HENT:  Head: Normocephalic and atraumatic.  Eyes: Pupils are equal, round, and reactive to light.  Neck: Normal range of motion.  Cardiovascular: Normal rate, regular rhythm and normal heart sounds.  No murmur heard. Pulmonary/Chest: Effort normal and breath sounds normal. She has no wheezes. Left breast exhibits inverted nipple and tenderness. Breasts are asymmetrical.  Left axilla abscess with purulent discharge Left outer quadrant breast tender with palpation  Abdominal: Soft. Normal appearance and bowel sounds are normal. She exhibits no distension. There is no tenderness.  Musculoskeletal: Normal range of motion. She exhibits no edema.  Neurological: She is alert and oriented to person, place, and time.  Skin: Skin is warm and dry. No rash noted.  Psychiatric: Judgment normal.     CMP Latest Ref Rng & Units 08/14/2017  Glucose 70 - 99 mg/dL 148(H)  BUN 6 - 20 mg/dL 18  Creatinine 0.44 - 1.00 mg/dL 0.77  Sodium 135 - 145 mmol/L 139  Potassium 3.5 - 5.1 mmol/L 3.1(L)  Chloride 98 - 111 mmol/L 99  CO2 22 - 32 mmol/L 30  Calcium 8.9 - 10.3 mg/dL 8.7(L)  Total Protein 6.0 - 8.5 g/dL -  Total Bilirubin 0.0 - 1.2 mg/dL -  Alkaline Phos 39 - 117 IU/L -  AST 0 - 40 IU/L -  ALT 0 - 32 IU/L -   CBC Latest Ref Rng & Units 08/14/2017  WBC 3.6 - 11.0 K/uL 13.7(H)  Hemoglobin 12.0 - 16.0 g/dL 12.6  Hematocrit 35.0 - 47.0 % 37.9  Platelets 150 - 440 K/uL 402       Dg Chest 2 View  Result Date: 08/14/2017 CLINICAL DATA:  Chest pain EXAM: CHEST - 2 VIEW COMPARISON:  01/22/2017, 09/24/2014 FINDINGS: Patchy opacity at the lingula and left base. No pleural effusion. Normal heart size. No pneumothorax. Mild scoliosis of the spine. Stable compression deformity of mid to lower thoracic vertebra. Mild bronchitic changes. IMPRESSION: Patchy atelectasis or minimal infiltrate at the lingula and left  base. Electronically Signed   By:  Donavan Foil M.D.   On: 08/14/2017 21:03   Assessment and plan- Patient is a 62 y.o. female who presents for worsening left axilla pain and outer breast tenderness.  1.  Triple negative left breast cancer: s/p lumpectomy, chemotherapy in 2010. Radiation completed in Spring 2011.  On annual surveillance with yearly mammograms.  Most recent mammogram is from 06/16/2016 which was reported as BI-RADS 1-negative.  She is scheduled for a mammogram today but is canceled due to pain in left breast. Will get rescheduled with follow-up.   2.  Left axilla and breast pain: Under left axilla reveals small abscess with purulent drainage.  Left breast is tender to touch.  She is afebrile.  She denies any chills or diaphoresis.  States she has been keeping the area clean.  Uncertain of how long the abscess has been open.  Thought to be from her underwire in her bra.  She has canceled her mammogram for today due to pain in left breast.  Spoke with Dr. Grayland Ormond and he recommends diagnostic mammogram with ultrasound to left axilla ASAP.  Scheduled for 08/24/2017 at 11 AM.  Will give her a prescription for doxycycline 100 mg twice daily for 10 days to see if this helps with infected abcess and pain. Encourage patient to keep access clean and dry with warm soap and water.  Can continue topical Neosporin to affected area.  Unable to prescribe pain medication due to past history of suicide attempt and dependency.  Recommend Tylenol as needed for pain.  Checked narcotic registry and she has not been prescribed narcotics since July 03, 2017 when she was seen in the emergency room.  She was given 8 tablets of oxycodone-acetaminophen 5-325 mg.      She refused a UDS.  Patient will not be prescribed any narcotics in this clinic.  Receives her Xanax through PCP Dr. Edwina Barth.    Visit Diagnosis 1. Breast pain, left   2. Axilla abrasion, infected, left, initial encounter   3. Altered mental status, unspecified altered mental  status type     Patient expressed understanding and was in agreement with this plan. She also understands that She can call clinic at any time with any questions, concerns, or complaints.   Greater than 50% was spent in counseling and coordination of care with this patient including but not limited to discussion of the relevant topics above (See A&P) including, but not limited to diagnosis and management of acute and chronic medical conditions.    Faythe Casa, AGNP-C [facility] Cell: [phone] Pager- [phone] 08/22/2017 12:24 PM

## 2017-08-24 ENCOUNTER — Ambulatory Visit
Admission: RE | Admit: 2017-08-24 | Discharge: 2017-08-24 | Disposition: A | Discharge status: Home / Self Care | Payer: Medicare Other | Source: Ambulatory Visit | Attending: Oncology | Admitting: Oncology

## 2017-08-24 DIAGNOSIS — N644 Mastodynia: Secondary | ICD-10-CM | POA: Diagnosis not present

## 2017-08-24 DIAGNOSIS — S40812A Abrasion of left upper arm, initial encounter: Secondary | ICD-10-CM | POA: Diagnosis present

## 2017-08-24 DIAGNOSIS — L089 Local infection of the skin and subcutaneous tissue, unspecified: Secondary | ICD-10-CM

## 2017-08-24 DIAGNOSIS — Z853 Personal history of malignant neoplasm of breast: Secondary | ICD-10-CM | POA: Insufficient documentation

## 2017-08-24 HISTORY — DX: Personal history of antineoplastic chemotherapy: Z92.21

## 2017-08-24 HISTORY — DX: Personal history of irradiation: Z92.3

## 2017-08-29 ENCOUNTER — Encounter: Payer: Self-pay | Admitting: Oncology

## 2017-08-29 NOTE — Progress Notes (Signed)
Mammogram and ultrasound of the left breast and axilla did not reveal evidence of malignancy.  No fluid collection or abscess within the left axilla corresponding to the area of clinical concern.  There was an area within the superficial soft tissue of the left axilla compatible with localized soft tissue edema suspected to represent recently resolved abscess or epidermal cyst.  She was encouraged to complete course of antibiotics.  Patient states left axilla is no longer draining and is feeling a little better since beginning antibiotics.  Keep follow-up as scheduled.  Dr. Grayland Ormond updated.  Faythe Casa, NP 08/29/2017 9:04 AM

## 2017-10-31 IMAGING — CT CT KNEE*R* W/CM
5 of 6 series · 16 of 33 positions shown, 17 images · IV contrast (agent unspecified)
Comparison: Right knee radiographs performed earlier today at [DATE]
p.m.

CLINICAL DATA: Acute onset of right knee pain. Unable to bear
weight. Concern for underlying hardware infection. Initial
encounter.

EXAM:
CT OF THE RIGHT KNEE WITH CONTRAST
TECHNIQUE: Multidetector CT imaging was performed following the standard
protocol during bolus administration of intravenous contrast.

[Series 2: knee · axial · 0.32mm/px · z∈[+0,+126]mm · 4 of 140 slices shown, 5 images]
[im 28/140  soft-tissue]
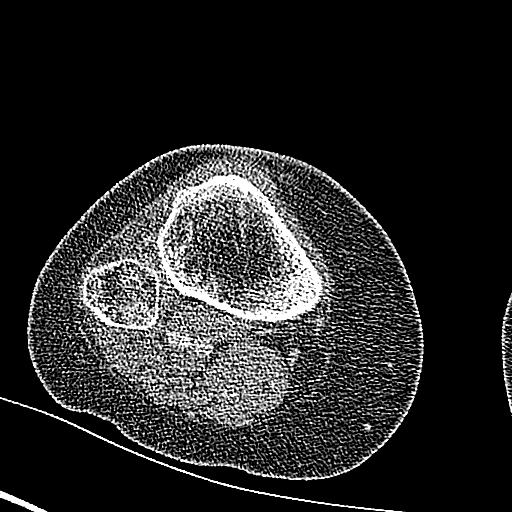
[im 28/140  bone]
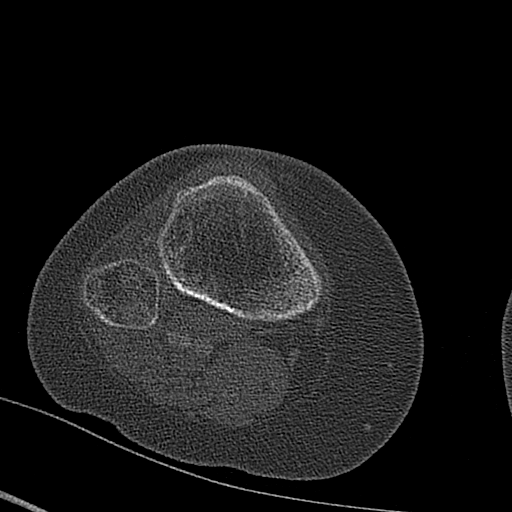
[im 56/140  bone]
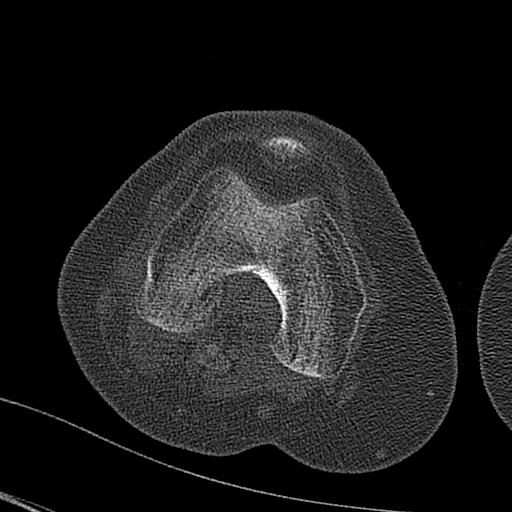
[im 84/140  bone]
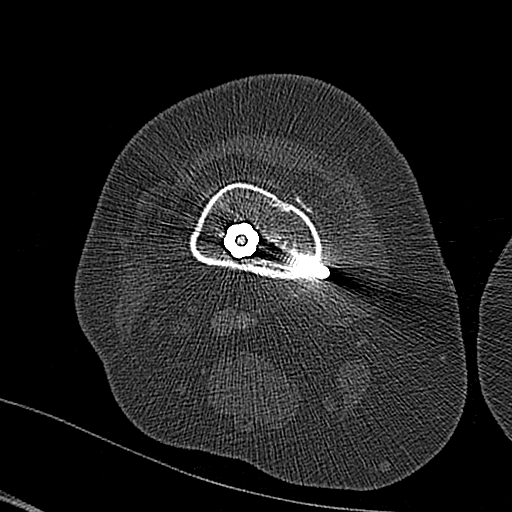
[im 112/140  bone]
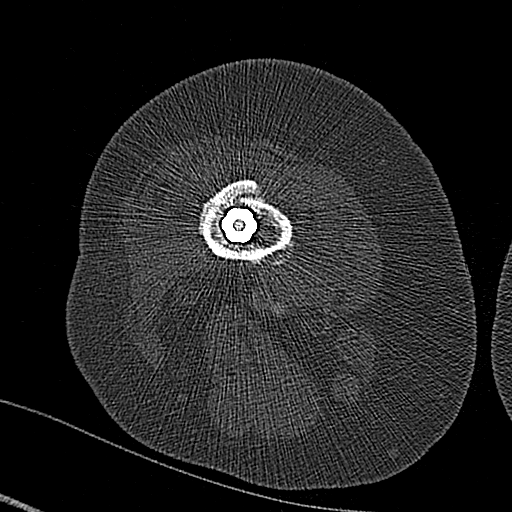

[Series 602: bone axial · axial · 0.41mm/px · z∈[+23,+90]mm · 2 of 101 slices shown]
[im 34/101  bone]
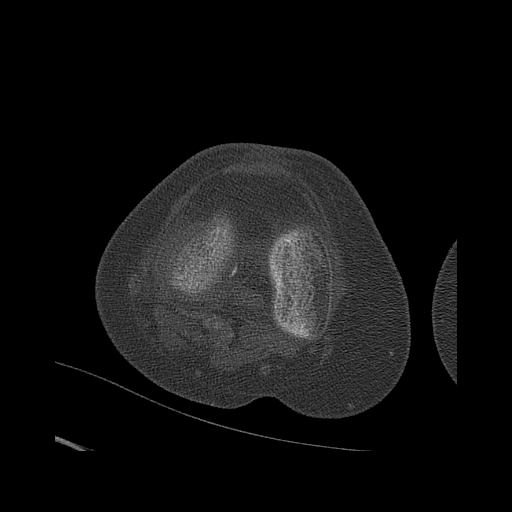
[im 67/101  bone]
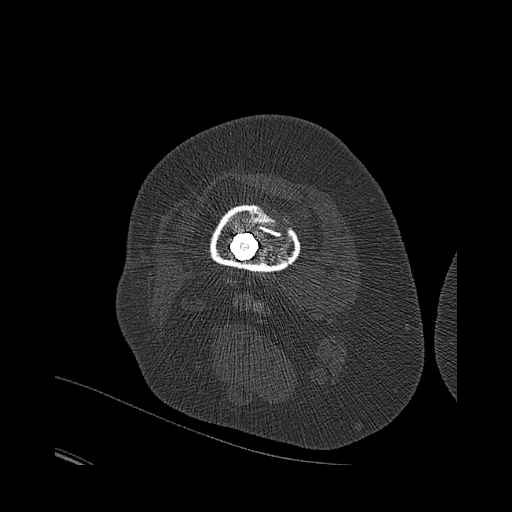

[Series 605: st axial · axial · 0.41mm/px · z∈[+23,+93]mm · 2 of 105 slices shown]
[im 35/105  bone]
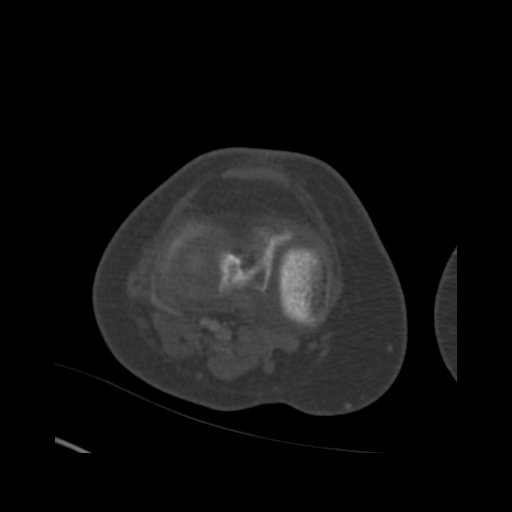
[im 70/105  bone]
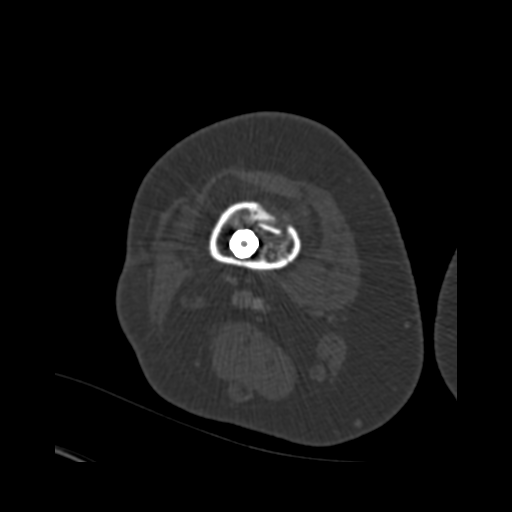

[Series 606: st cor · coronal · 0.41mm/px · 3 of 63 slices shown]
[im 13/63  bone]
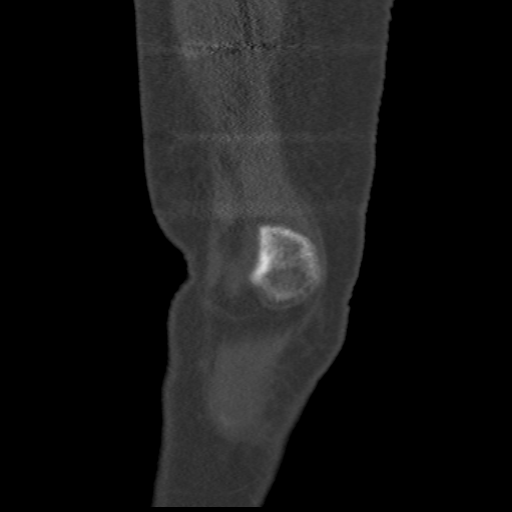
[im 25/63  bone]
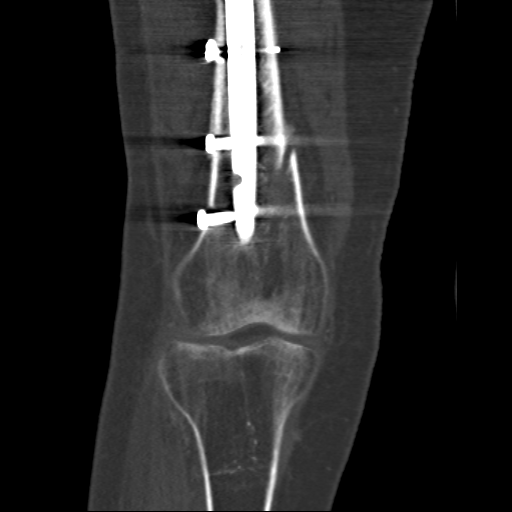
[im 38/63  bone]
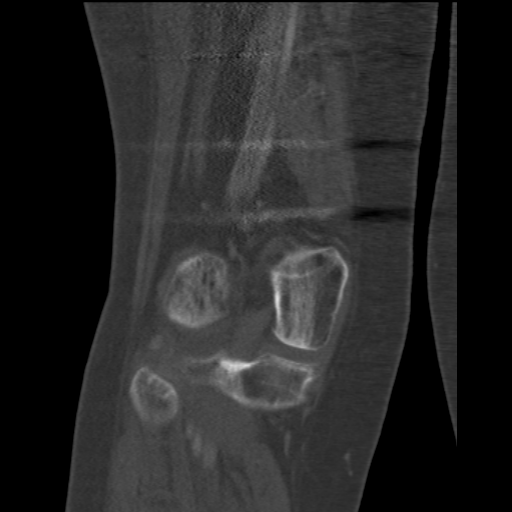

[Series 607: st sag · sagittal · 0.41mm/px · 5 of 60 slices shown]
[im 10/60  bone]
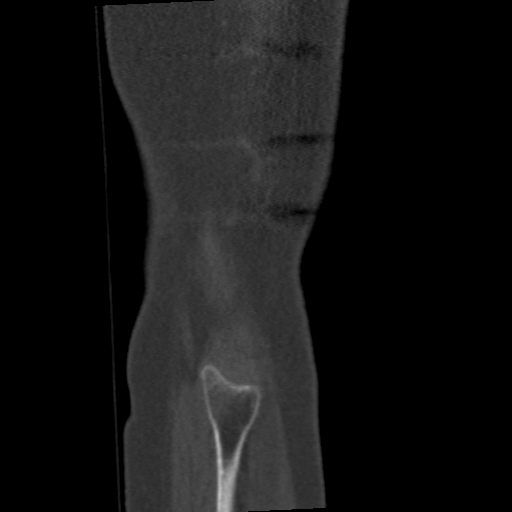
[im 20/60  bone]
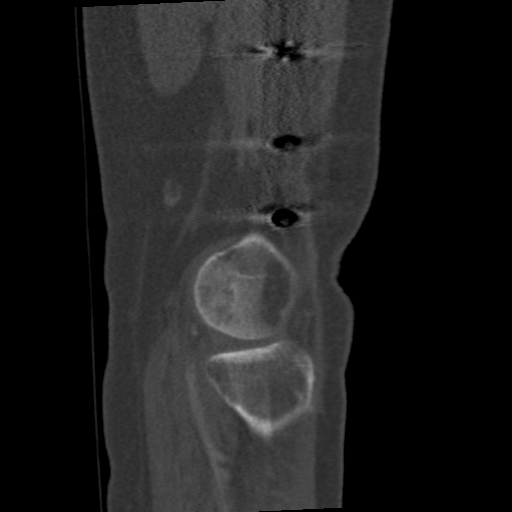
[im 30/60  bone]
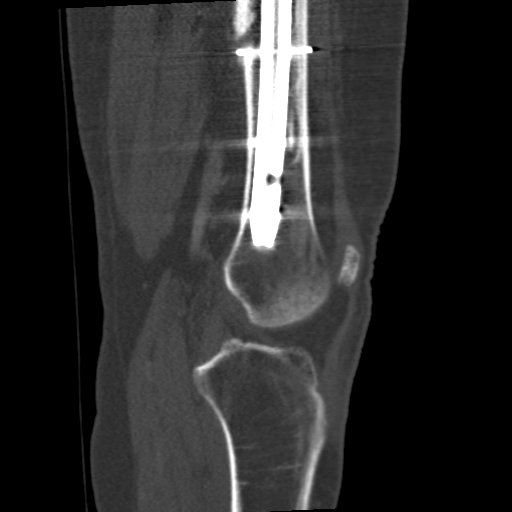
[im 40/60  bone]
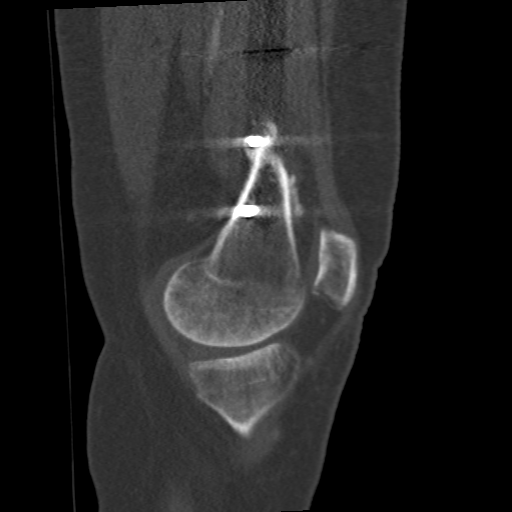
[im 50/60  bone]
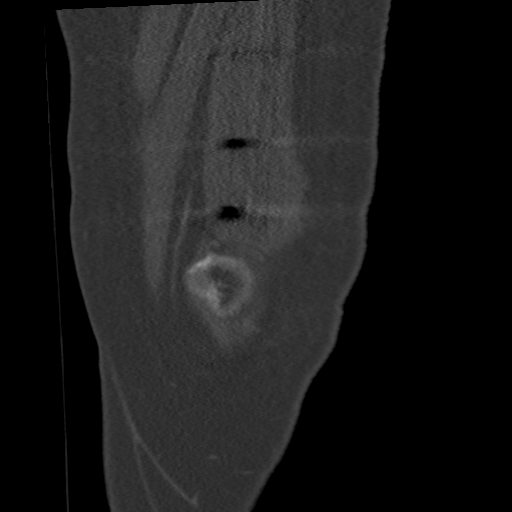

[16 of 33 positions shown; findings below may reference images not displayed]

FINDINGS: There appears to be mild focal lucency about the distal aspect of
the patient's intramedullary rod, nonspecific in appearance. There
is suspicion of mild periosteal apposition along the medial aspect
of the proximal portion of the fracture. Underlying infection cannot
be excluded. Would correlate with the patient's symptoms and lab
values, and consider three-phase bone scan for further evaluation as
deemed clinically appropriate.

The fracture through the distal femoral diaphysis is still evident,
with some degree of healing response. Associated screws are grossly
unremarkable in appearance. No significant knee joint effusion is
seen. Mild postoperative soft tissue inflammation is noted about the
distal femur. No abscess is seen. The visualized vasculature is
grossly unremarkable.
IMPRESSION: 1. Mild apparent periosteal apposition along the medial aspect of
the proximal portion of the fracture. Nonspecific mild focal lucency
about the distal aspect of the intramedullary rod. Given clinical
concern, underlying infection cannot be excluded, though not
definitely characterized on CT. Would correlate with the patient's
symptoms and lab values (CRP, etc.), and consider three-phase bone
scan for further evaluation as deemed clinically appropriate.
2. Right distal femoral fracture remains evident, with some degree
of healing response.

## 2018-01-02 ENCOUNTER — Emergency Department: Payer: Medicare Other

## 2018-01-02 ENCOUNTER — Encounter: Payer: Self-pay | Admitting: Emergency Medicine

## 2018-01-02 ENCOUNTER — Emergency Department
Admission: EM | Admit: 2018-01-02 | Discharge: 2018-01-02 | Disposition: A | Discharge status: Home / Self Care | Payer: Medicare Other | Attending: Emergency Medicine | Admitting: Emergency Medicine

## 2018-01-02 DIAGNOSIS — F329 Major depressive disorder, single episode, unspecified: Secondary | ICD-10-CM | POA: Diagnosis not present

## 2018-01-02 DIAGNOSIS — M479 Spondylosis, unspecified: Secondary | ICD-10-CM | POA: Insufficient documentation

## 2018-01-02 DIAGNOSIS — E876 Hypokalemia: Secondary | ICD-10-CM | POA: Diagnosis not present

## 2018-01-02 DIAGNOSIS — M5412 Radiculopathy, cervical region: Secondary | ICD-10-CM | POA: Insufficient documentation

## 2018-01-02 DIAGNOSIS — Z79899 Other long term (current) drug therapy: Secondary | ICD-10-CM | POA: Diagnosis not present

## 2018-01-02 DIAGNOSIS — F1410 Cocaine abuse, uncomplicated: Secondary | ICD-10-CM | POA: Diagnosis not present

## 2018-01-02 DIAGNOSIS — Z853 Personal history of malignant neoplasm of breast: Secondary | ICD-10-CM | POA: Diagnosis not present

## 2018-01-02 DIAGNOSIS — M4722 Other spondylosis with radiculopathy, cervical region: Secondary | ICD-10-CM

## 2018-01-02 DIAGNOSIS — M79621 Pain in right upper arm: Secondary | ICD-10-CM | POA: Diagnosis present

## 2018-01-02 DIAGNOSIS — F419 Anxiety disorder, unspecified: Secondary | ICD-10-CM | POA: Diagnosis not present

## 2018-01-02 DIAGNOSIS — F1010 Alcohol abuse, uncomplicated: Secondary | ICD-10-CM | POA: Insufficient documentation

## 2018-01-02 DIAGNOSIS — I10 Essential (primary) hypertension: Secondary | ICD-10-CM | POA: Insufficient documentation

## 2018-01-02 DIAGNOSIS — F17210 Nicotine dependence, cigarettes, uncomplicated: Secondary | ICD-10-CM | POA: Diagnosis not present

## 2018-01-02 LAB — URINALYSIS, COMPLETE (UACMP) WITH MICROSCOPIC
BILIRUBIN URINE: NEGATIVE
Glucose, UA: NEGATIVE mg/dL
Ketones, ur: NEGATIVE mg/dL
Nitrite: POSITIVE — AB
Protein, ur: 30 mg/dL — AB
Specific Gravity, Urine: 1.021 (ref 1.005–1.030)
WBC, UA: >50 WBC/hpf — ABNORMAL HIGH (ref 0–5)
pH: 5 (ref 5.0–8.0)

## 2018-01-02 LAB — URINE DRUG SCREEN, QUALITATIVE (ARMC ONLY)
Amphetamines, Ur Screen: NOT DETECTED
Barbiturates, Ur Screen: NOT DETECTED
Benzodiazepine, Ur Scrn: POSITIVE — AB
Cannabinoid 50 Ng, Ur ~~LOC~~: NOT DETECTED
Cocaine Metabolite,Ur ~~LOC~~: NOT DETECTED
MDMA (Ecstasy)Ur Screen: NOT DETECTED
Methadone Scn, Ur: NOT DETECTED
OPIATE, UR SCREEN: NOT DETECTED
Phencyclidine (PCP) Ur S: NOT DETECTED
Tricyclic, Ur Screen: NOT DETECTED

## 2018-01-02 LAB — BASIC METABOLIC PANEL
ANION GAP: 8 (ref 5–15)
BUN: 13 mg/dL (ref 6–20)
CHLORIDE: 110 mmol/L (ref 98–111)
CO2: 21 mmol/L — ABNORMAL LOW (ref 22–32)
Calcium: 8.4 mg/dL — ABNORMAL LOW (ref 8.9–10.3)
Creatinine, Ser: 0.63 mg/dL (ref 0.44–1.00)
Glucose, Bld: 92 mg/dL (ref 70–99)
POTASSIUM: 2.9 mmol/L — AB (ref 3.5–5.1)
SODIUM: 139 mmol/L (ref 135–145)

## 2018-01-02 LAB — CBC WITH DIFFERENTIAL/PLATELET
ABS IMMATURE GRANULOCYTES: 0.03 10*3/uL (ref 0.00–0.07)
BASOS ABS: 0 10*3/uL (ref 0.0–0.1)
BASOS PCT: 0 %
Eosinophils Absolute: 0.3 10*3/uL (ref 0.0–0.5)
Eosinophils Relative: 3 %
HCT: 33.7 % — ABNORMAL LOW (ref 36.0–46.0)
HEMOGLOBIN: 10.8 g/dL — AB (ref 12.0–15.0)
IMMATURE GRANULOCYTES: 0 %
LYMPHS PCT: 27 %
Lymphs Abs: 2.6 10*3/uL (ref 0.7–4.0)
MCH: 29.5 pg (ref 26.0–34.0)
MCHC: 32 g/dL (ref 30.0–36.0)
MCV: 92.1 fL (ref 80.0–100.0)
Monocytes Absolute: 0.6 10*3/uL (ref 0.1–1.0)
Monocytes Relative: 6 %
NEUTROS ABS: 6.1 10*3/uL (ref 1.7–7.7)
NEUTROS PCT: 64 %
NRBC: 0 % (ref 0.0–0.2)
PLATELETS: 334 10*3/uL (ref 150–400)
RBC: 3.66 MIL/uL — AB (ref 3.87–5.11)
RDW: 14.4 % (ref 11.5–15.5)
WBC: 9.7 10*3/uL (ref 4.0–10.5)

## 2018-01-02 LAB — SEDIMENTATION RATE: SED RATE: 53 mm/h — AB (ref 0–30)

## 2018-01-02 MED ORDER — HYDROMORPHONE HCL 1 MG/ML IJ SOLN: 1.0000 mg | Freq: Once | INTRAMUSCULAR | Status: DC

## 2018-01-02 MED ORDER — KETOROLAC TROMETHAMINE 30 MG/ML IJ SOLN
30.0000 mg | Freq: Once | INTRAMUSCULAR | Status: AC
  Administered 2018-01-02: 30 mg via INTRAMUSCULAR
  Filled 2018-01-02: qty 1

## 2018-01-02 MED ORDER — OXYCODONE-ACETAMINOPHEN 5-325 MG PO TABS: 1.0000 | ORAL_TABLET | Freq: Four times a day (QID) | ORAL | 0 refills | Status: DC | PRN

## 2018-01-02 NOTE — ED Notes (Signed)
See triage note.  Presents with bilateral hand pain and swelling   States sx's started about 8 months   Was seen by PCP and given 2 rounds of prednisone  States pain and swelling return  Denies any injury

## 2018-01-02 NOTE — Discharge Instructions (Addendum)
Follow discharge care instructions.  Be advised that she needs to restart taking your potassium secondary to your low levels today.

## 2018-01-02 NOTE — ED Provider Notes (Signed)
[facility] Emergency Department Provider Note   ____________________________________________   First MD Initiated Contact with Patient 01/02/18 1341     (approximate)  I have reviewed the triage vital signs and the nursing notes.   HISTORY  Chief Complaint Hand Pain    HPI Janet Zuniga is a 63 y.o. female patient complain of bilateral hand pain that radiates upper arms.  Patient state complaint x8 months.  Patient says she seen a PCP numerous times and was prescribed prednisone which only brought transient relief.  Patient stated in the past week the pain has worsened.  Patient states she believes is carpal tunnel but does not present with history of provocative measures.  Patient rates the pain as a 9/10.  Patient had x-rays couple months ago of the hand with no acute findings. Past Medical History:  Diagnosis Date  . Ankylosing spondylitis ([place])   . Anxiety   . Arthritis   . Blood in stool   . Breast cancer ([place]) 2010   Left breast- chemo/radiation  . Chronic low back pain   . Chronic pain   . Depression   . Headache   . Hepatitis C 2007  . Hip fracture ([place])   . History of blood transfusion   . Hypertension   . Insomnia   . OA (osteoarthritis)   . Osteoporosis   . Personal history of chemotherapy   . Personal history of radiation therapy   . Spinal stenosis of lumbar region   . Spondyloarthritis     Patient Active Problem List   Diagnosis Date Noted  . Degenerative disc disease, lumbar 09/20/2016  . Shoulder joint pain 09/18/2016  . Hypertension   . Ankylosing spondylitis ([place])   . OA (osteoarthritis)   . Osteoporosis   . Malignant neoplasm of left female breast ([place]) 09/12/2015  . Cocaine abuse ([place]) 08/18/2015  . Alcohol abuse 08/03/2015  . Chronic use of opiate drugs therapeutic purposes 05/19/2015  . Leg reflex sympathetic dystrophy, right 05/19/2015  . Fracture of shaft of femur ([place]) 01/12/2015  . Anxiety 01/12/2015  .  Anxiety and depression 12/24/2014  . Chronic bilateral low back pain with bilateral sciatica 12/24/2014  . Substance induced mood disorder ([place]) 10/27/2014  . Chronic pain in right foot 10/20/2014  . Insomnia 06/05/2014  . Chronic pain syndrome 05/15/2014  . Raynaud's phenomenon without gangrene 05/15/2014  . Spinal stenosis of lumbar region 05/15/2014  . 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 Generalized anxiety disorder 05/04/2014  . Hepatitis C 03/12/2014  . Midline low back pain without sciatica 03/12/2014  . History of breast cancer in female 12/23/2013  . Chronic pain 12/23/2013  . Ankylosing spondylitis of lumbosacral region ([place]) 12/23/2013  . Depression 12/23/2013  . Osteoarthritis 10/05/2011    Past Surgical History:  Procedure Laterality Date  . BREAST BIOPSY Right 1983   neg  . BREAST EXCISIONAL BIOPSY Left 2010   triple neg  . BREAST LUMPECTOMY Left 2010  . FEMUR FRACTURE SURGERY    . FOOT SURGERY  2000 and 2003  . INTRAMEDULLARY (IM) NAIL INTERTROCHANTERIC Right 01/12/2015   Procedure: INTRAMEDULLARY (IM) NAIL INTERTROCHANTRIC;  Surgeon: Earnestine Leys, MD;  Location: [facility] ORS;  Service: Orthopedics;  Laterality: Right;  . KNEE SURGERY Left 2004  . TUBAL LIGATION      Prior to Admission medications   Medication Sig Start Date End Date Taking? Authorizing Provider  ALPRAZolam Duanne Moron) 1 MG tablet Take 1 tablet (1 mg total) by mouth 2 (two) times  daily as needed for anxiety. 04/18/17   Volney American, PA-C  doxycycline (VIBRA-TABS) 100 MG tablet Take 1 tablet (100 mg total) by mouth 2 (two) times daily. 08/20/17   Jacquelin Hawking, NP  famotidine (PEPCID) 40 MG tablet Take 1 tablet (40 mg total) by mouth every evening. 06/12/16 08/20/17  Nance Pear, MD  FLUoxetine (PROZAC) 40 MG capsule Take 1 capsule (40 mg total) by mouth daily. 04/25/17   Volney American, PA-C  furosemide (LASIX) 40 MG tablet Take 1 tablet (40 mg total) by mouth daily. 04/27/17   Volney American, PA-C  meloxicam (MOBIC) 15 MG tablet Take 15 mg by mouth daily.    [provider]  oxyCODONE-acetaminophen (PERCOCET) 5-325 MG tablet Take 1 tablet by mouth every 6 (six) hours as needed for severe pain. 01/02/18   Sable Feil, PA-C  traZODone (DESYREL) 150 MG tablet Take 1 tablet (150 mg total) by mouth at bedtime as needed for sleep. 04/25/17   Volney American, PA-C    Allergies Tramadol and Penicillins  Family History  Problem Relation Age of Onset  . Arthritis Mother   . Stroke Mother   . Heart disease Mother   . Hypertension Mother   . Hyperlipidemia Mother   . Depression Mother   . Seizures Mother   . Osteoporosis Mother   . Hypertension Father   . Hypertension Brother   . Arthritis Brother   . Depression Brother   . Diabetes Brother   . Hyperlipidemia Brother   . Heart disease Maternal Aunt   . Breast cancer Maternal Aunt   . Seizures Sister   . Stroke Sister   . COPD Neg Hx   . Cancer Neg Hx     Social History Social History   Tobacco Use  . Smoking status: Current Every Day Smoker    Packs/day: 0.20    Years: 25.00    Pack years: 5.00    Types: Cigarettes  . Smokeless tobacco: Never Used  Substance Use Topics  . Alcohol use: Yes    Comment: occasionally a glass of wine cooler  . Drug use: Yes    Types: Marijuana    Comment: occasionally     Review of Systems Constitutional: No fever/chills Eyes: No visual changes. ENT: No sore throat. Cardiovascular: Denies chest pain. Respiratory: Denies shortness of breath. Gastrointestinal: No abdominal pain.  No nausea, no vomiting.  No diarrhea.  No constipation. Genitourinary: Negative for dysuria. Musculoskeletal: Negative for back pain. Skin: Negative for rash. Neurological: Negative for headaches, focal weakness or numbness. Psychiatric:Anxiety and depression Endocrine:Hypertension hepatitis C Allergic/Immunilogical: Tramadol  /penicillin ____________________________________________   PHYSICAL EXAM:  VITAL SIGNS: ED Triage Vitals  Enc Vitals Group     BP 01/02/18 1306 110/89     Pulse Rate 01/02/18 1306 79     Resp 01/02/18 1306 18     Temp 01/02/18 1306 97.6 F (36.4 C)     Temp Source 01/02/18 1306 Oral     SpO2 01/02/18 1306 96 %     Weight 01/02/18 1308 160 lb (72.6 kg)     Height 01/02/18 1308 5\' 1"  (1.549 m)     Head Circumference --      Peak Flow --      Pain Score 01/02/18 1308 9     Pain Loc --      Pain Edu? --      Excl. in [place]? --    Constitutional: Alert and oriented. Well  appearing and in no acute distress. Neck: No stridor.  Cervical spine tenderness to palpitation at C5 and C6. Hematological/Lymphatic/Immunilogical: No cervical lymphadenopathy. Cardiovascular: Normal rate, regular rhythm. Grossly normal heart sounds.  Good peripheral circulation. Respiratory: Normal respiratory effort.  No retractions. Lungs CTAB. Gastrointestinal: Soft and nontender. No distention. No abdominal bruits. No CVA tenderness. Musculoskeletal: No obvious deformity to the bilateral hands.  Mild edema.  Full and equal range of motion. Neurologic:  Normal speech and language. No gross focal neurologic deficits are appreciated. No gait instability. Skin:  Skin is warm, dry and intact. No rash noted. Psychiatric: Mood and affect are normal. Speech and behavior are normal.  ____________________________________________   LABS (all labs ordered are listed, but only abnormal results are displayed)  Labs Reviewed  SEDIMENTATION RATE - Abnormal; Notable for the following components:      Result Value   Sed Rate 53 (*)    All other components within normal limits  BASIC METABOLIC PANEL - Abnormal; Notable for the following components:   Potassium 2.9 (*)    CO2 21 (*)    Calcium 8.4 (*)    All other components within normal limits  CBC WITH DIFFERENTIAL/PLATELET - Abnormal; Notable for the following  components:   RBC 3.66 (*)    Hemoglobin 10.8 (*)    HCT 33.7 (*)    All other components within normal limits  URINALYSIS, COMPLETE (UACMP) WITH MICROSCOPIC - Abnormal; Notable for the following components:   Color, Urine AMBER (*)    APPearance HAZY (*)    Hgb urine dipstick MODERATE (*)    Protein, ur 30 (*)    Nitrite POSITIVE (*)    Leukocytes, UA SMALL (*)    WBC, UA >50 (*)    Bacteria, UA FEW (*)    All other components within normal limits  URINE DRUG SCREEN, QUALITATIVE ([facility] ONLY) - Abnormal; Notable for the following components:   Benzodiazepine, Ur Scrn POSITIVE (*)    All other components within normal limits   ____________________________________________  EKG   ____________________________________________  RADIOLOGY  ED MD interpretation:    Official radiology report(s): Dg Cervical Spine 2-3 Views  Result Date: 01/02/2018 CLINICAL DATA:  Pain in the arms EXAM: CERVICAL SPINE - 2-3 VIEW COMPARISON:  None. FINDINGS: Suboptimal visualization of cervicothoracic junction. Cervical vertebral bodies demonstrate normal stature. Mild degenerative changes at C5-C6 and C6-C7 with small osteophyte at C4-C5. Normal prevertebral soft tissue thickness. Limited visibility of the dens. The lateral masses are within normal limits. Right carotid vascular calcification IMPRESSION: Mild degenerative changes of the cervical spine. No radiographic evidence for acute osseous abnormality. Electronically Signed   By: Donavan Foil M.D.   On: 01/02/2018 14:13    ____________________________________________   PROCEDURES  Procedure(s) performed: None  Procedures  Critical Care performed: No  ____________________________________________   INITIAL IMPRESSION / ASSESSMENT AND PLAN / ED COURSE  As part of my medical decision making, I reviewed the following data within the [place]    Patient presents for persistent upper extremity pain and edema.  Discussed  x-rays of the cervical spine showing degenerative changes.  Patient also was advised that her potassium level was low.  Patient is noncompliant with medication which she has at home to correct this condition.  Patient advised to follow-up with schedule orthopedic appointment.      ____________________________________________   FINAL CLINICAL IMPRESSION(S) / ED DIAGNOSES  Final diagnoses:  Osteoarthritis of spine with radiculopathy, cervical region  Hypokalemia  ED Discharge Orders         Ordered    oxyCODONE-acetaminophen (PERCOCET) 5-325 MG tablet  Every 6 hours PRN     01/02/18 1521           Note:  This document was prepared using Dragon voice recognition software and may include unintentional dictation errors.    Sable Feil, PA-C 01/02/18 1527    Schuyler Amor, MD 01/03/18 1340

## 2018-01-02 NOTE — ED Triage Notes (Signed)
Patient presents to the ED with pain in her hands, radiating into her arms x 8 months.  Patient states her doctor has put her on a couple of courses of prednisone but this has not brought lasting relief.  Patient states she feels the pain is worsening.  Patient states, 'I think I might have carpal tunnel."

## 2018-01-02 NOTE — ED Triage Notes (Signed)
FIRST NURSE NOTE: pt to ed via ems from home with c/o bilat arms, hands x 7 months.  States she has been on prednisone without relief.

## 2018-01-16 ENCOUNTER — Other Ambulatory Visit: Payer: Self-pay | Admitting: Student

## 2018-01-16 DIAGNOSIS — M5412 Radiculopathy, cervical region: Secondary | ICD-10-CM

## 2018-01-16 DIAGNOSIS — M5413 Radiculopathy, cervicothoracic region: Secondary | ICD-10-CM

## 2018-01-24 ENCOUNTER — Ambulatory Visit: Payer: Self-pay | Admitting: Student in an Organized Health Care Education/Training Program

## 2018-01-29 ENCOUNTER — Ambulatory Visit
Admission: RE | Admit: 2018-01-29 | Discharge: 2018-01-29 | Disposition: A | Discharge status: Home / Self Care | Payer: Medicare Other | Source: Ambulatory Visit | Attending: Student | Admitting: Student

## 2018-01-29 DIAGNOSIS — M5412 Radiculopathy, cervical region: Secondary | ICD-10-CM

## 2018-02-02 ENCOUNTER — Emergency Department
Admission: EM | Admit: 2018-02-02 | Discharge: 2018-02-03 | Disposition: A | Discharge status: Home / Self Care | Payer: Medicare Other | Attending: Emergency Medicine | Admitting: Emergency Medicine

## 2018-02-02 ENCOUNTER — Other Ambulatory Visit: Payer: Self-pay

## 2018-02-02 ENCOUNTER — Encounter: Payer: Self-pay | Admitting: Emergency Medicine

## 2018-02-02 DIAGNOSIS — Z79899 Other long term (current) drug therapy: Secondary | ICD-10-CM | POA: Insufficient documentation

## 2018-02-02 DIAGNOSIS — F331 Major depressive disorder, recurrent, moderate: Secondary | ICD-10-CM

## 2018-02-02 DIAGNOSIS — F13288 Sedative, 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, hypnotic or anxiolytic-induced disorder: Secondary | ICD-10-CM

## 2018-02-02 DIAGNOSIS — G8929 Other chronic pain: Secondary | ICD-10-CM | POA: Diagnosis present

## 2018-02-02 DIAGNOSIS — F329 Major depressive disorder, single episode, unspecified: Secondary | ICD-10-CM | POA: Diagnosis not present

## 2018-02-02 DIAGNOSIS — F1320 Sedative, hypnotic or anxiolytic dependence, uncomplicated: Secondary | ICD-10-CM

## 2018-02-02 DIAGNOSIS — M457 Ankylosing spondylitis of lumbosacral region: Secondary | ICD-10-CM | POA: Diagnosis present

## 2018-02-02 DIAGNOSIS — F17210 Nicotine dependence, cigarettes, uncomplicated: Secondary | ICD-10-CM | POA: Insufficient documentation

## 2018-02-02 DIAGNOSIS — I10 Essential (primary) hypertension: Secondary | ICD-10-CM | POA: Diagnosis not present

## 2018-02-02 LAB — CBC
HCT: 35.6 % — ABNORMAL LOW (ref 36.0–46.0)
Hemoglobin: 11.4 g/dL — ABNORMAL LOW (ref 12.0–15.0)
MCH: 29.2 pg (ref 26.0–34.0)
MCHC: 32 g/dL (ref 30.0–36.0)
MCV: 91 fL (ref 80.0–100.0)
Platelets: 315 10*3/uL (ref 150–400)
RBC: 3.91 MIL/uL (ref 3.87–5.11)
RDW: 15.2 % (ref 11.5–15.5)
WBC: 7.6 10*3/uL (ref 4.0–10.5)
nRBC: 0 % (ref 0.0–0.2)

## 2018-02-02 LAB — COMPREHENSIVE METABOLIC PANEL
ALBUMIN: 4 g/dL (ref 3.5–5.0)
ALT: 10 U/L (ref 0–44)
AST: 15 U/L (ref 15–41)
Alkaline Phosphatase: 55 U/L (ref 38–126)
Anion gap: 9 (ref 5–15)
BUN: 16 mg/dL (ref 6–20)
CO2: 23 mmol/L (ref 22–32)
Calcium: 9.1 mg/dL (ref 8.9–10.3)
Chloride: 104 mmol/L (ref 98–111)
Creatinine, Ser: 0.81 mg/dL (ref 0.44–1.00)
GFR calc Af Amer: >60 mL/min (ref >60)
GFR calc non Af Amer: >60 mL/min (ref >60)
Glucose, Bld: 85 mg/dL (ref 70–99)
Potassium: 3.3 mmol/L — ABNORMAL LOW (ref 3.5–5.1)
Sodium: 136 mmol/L (ref 135–145)
Total Bilirubin: 0.4 mg/dL (ref 0.3–1.2)
Total Protein: 7.5 g/dL (ref 6.5–8.1)

## 2018-02-02 LAB — SALICYLATE LEVEL

## 2018-02-02 LAB — ACETAMINOPHEN LEVEL: Acetaminophen (Tylenol), Serum: 20 ug/mL (ref 10–30)

## 2018-02-02 LAB — ETHANOL: Alcohol, Ethyl (B): <10 mg/dL (ref <10)

## 2018-02-02 NOTE — ED Notes (Signed)
SOC complete.  

## 2018-02-02 NOTE — ED Notes (Signed)
Pt has depends on. Left on pt. Pt wanded by [employer].    1 night gown 1 zip up hoodie 1 pair slippers 1 purse

## 2018-02-02 NOTE — ED Notes (Signed)
Dressed out with this Comptroller dailey with [place] [employer]

## 2018-02-02 NOTE — ED Provider Notes (Signed)
[facility] Emergency Department Provider Note       Time seen: ----------------------------------------- 3:56 PM on 02/02/2018 -----------------------------------------   I have reviewed the triage vital signs and the nursing notes.  HISTORY   Chief Complaint Psychiatric Evaluation    HPI Janet Zuniga is a 63 y.o. female with a history of anxiety, chronic pain, headache, hepatitis C, hypertension, insomnia who presents to the ED for involuntary commitment.  Patient was brought in by police department.  Her brother took out commitment papers because she reports she took "a bunch of pills".  Patient was having trouble staying awake.  Patient states she does not think she took too much of her medication.  She has been depressed and crying all the time because of the loss of her mother almost 1 year ago.  Past Medical History:  Diagnosis Date  . Ankylosing spondylitis ([place])   . Anxiety   . Arthritis   . Blood in stool   . Breast cancer ([place]) 2010   Left breast- chemo/radiation  . Chronic low back pain   . Chronic pain   . Depression   . Headache   . Hepatitis C 2007  . Hip fracture ([place])   . History of blood transfusion   . Hypertension   . Insomnia   . OA (osteoarthritis)   . Osteoporosis   . Personal history of chemotherapy   . Personal history of radiation therapy   . Spinal stenosis of lumbar region   . Spondyloarthritis     Patient Active Problem List   Diagnosis Date Noted  . Degenerative disc disease, lumbar 09/20/2016  . Shoulder joint pain 09/18/2016  . Hypertension   . Ankylosing spondylitis ([place])   . OA (osteoarthritis)   . Osteoporosis   . Malignant neoplasm of left female breast ([place]) 09/12/2015  . Cocaine abuse ([place]) 08/18/2015  . Alcohol abuse 08/03/2015  . Chronic use of opiate drugs therapeutic purposes 05/19/2015  . Leg reflex sympathetic dystrophy, right 05/19/2015  . Fracture of shaft of femur ([place]) 01/12/2015  .  Anxiety 01/12/2015  . Anxiety and depression 12/24/2014  . Chronic bilateral low back pain with bilateral sciatica 12/24/2014  . Substance induced mood disorder ([place]) 10/27/2014  . Chronic pain in right foot 10/20/2014  . Insomnia 06/05/2014  . Chronic pain syndrome 05/15/2014  . Raynaud's phenomenon without gangrene 05/15/2014  . Spinal stenosis of lumbar region 05/15/2014  . 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 Generalized anxiety disorder 05/04/2014  . Hepatitis C 03/12/2014  . Midline low back pain without sciatica 03/12/2014  . History of breast cancer in female 12/23/2013  . Chronic pain 12/23/2013  . Ankylosing spondylitis of lumbosacral region ([place]) 12/23/2013  . Depression 12/23/2013  . Osteoarthritis 10/05/2011    Past Surgical History:  Procedure Laterality Date  . BREAST BIOPSY Right 1983   neg  . BREAST EXCISIONAL BIOPSY Left 2010   triple neg  . BREAST LUMPECTOMY Left 2010  . FEMUR FRACTURE SURGERY    . FOOT SURGERY  2000 and 2003  . INTRAMEDULLARY (IM) NAIL INTERTROCHANTERIC Right 01/12/2015   Procedure: INTRAMEDULLARY (IM) NAIL INTERTROCHANTRIC;  Surgeon: Earnestine Leys, MD;  Location: [facility] ORS;  Service: Orthopedics;  Laterality: Right;  . KNEE SURGERY Left 2004  . TUBAL LIGATION      Allergies Tramadol and Penicillins  Social History Social History   Tobacco Use  . Smoking status: Current Every Day Smoker    Packs/day: 0.20    Years: 25.00  Pack years: 5.00    Types: Cigarettes  . Smokeless tobacco: Never Used  Substance Use Topics  . Alcohol use: Yes    Comment: occasionally a glass of wine cooler  . Drug use: Yes    Types: Marijuana    Comment: occasionally    Review of Systems Constitutional: Negative for fever. Cardiovascular: Negative for chest pain. Respiratory: Negative for shortness of breath. Gastrointestinal: Negative for abdominal pain, vomiting and diarrhea. Musculoskeletal: Negative for back pain. Skin: Negative for  rash. Neurological: Negative for headaches, focal weakness or numbness. Psychiatric: Negative for suicidal or homicidal ideation All systems negative/normal/unremarkable except as stated in the HPI  ____________________________________________   PHYSICAL EXAM:  VITAL SIGNS: ED Triage Vitals  Enc Vitals Group     BP 02/02/18 1537 115/64     Pulse Rate 02/02/18 1537 65     Resp 02/02/18 1537 18     Temp 02/02/18 1537 97.8 F (36.6 C)     Temp Source 02/02/18 1537 Oral     SpO2 02/02/18 1537 97 %     Weight 02/02/18 1531 160 lb (72.6 kg)     Height 02/02/18 1531 5\' 1"  (1.549 m)     Head Circumference --      Peak Flow --      Pain Score 02/02/18 1531 8     Pain Loc --      Pain Edu? --      Excl. in [place]? --    Constitutional: Drowsy, no acute distress Eyes: Conjunctivae are normal. Normal extraocular movements. ENT      Head: Normocephalic and atraumatic.      Nose: No congestion/rhinnorhea.      Mouth/Throat: Mucous membranes are moist.      Neck: No stridor. Cardiovascular: Normal rate, regular rhythm. No murmurs, rubs, or gallops. Respiratory: Normal respiratory effort without tachypnea nor retractions. Breath sounds are clear and equal bilaterally. No wheezes/rales/rhonchi. Gastrointestinal: Soft and nontender. Normal bowel sounds Musculoskeletal: Nontender with normal range of motion in extremities. No lower extremity tenderness nor edema. Neurologic:  Normal speech and language. No gross focal neurologic deficits are appreciated.  Skin:  Skin is warm, dry and intact. No rash noted. Psychiatric: Depressed and tearful  ____________________________________________  ED COURSE:  As part of my medical decision making, I reviewed the following data within the [place] History obtained from family if available, nursing notes, old chart and ekg, as well as notes from prior ED visits. Patient presented for possible overdose, we will assess with labs and  imaging as indicated at this time.   Procedures ____________________________________________   LABS (pertinent positives/negatives)  Labs Reviewed  CBC - Abnormal; Notable for the following components:      Result Value   Hemoglobin 11.4 (*)    HCT 35.6 (*)    All other components within normal limits  COMPREHENSIVE METABOLIC PANEL  ETHANOL  SALICYLATE LEVEL  ACETAMINOPHEN LEVEL  URINE DRUG SCREEN, QUALITATIVE ([facility] ONLY)  POC URINE PREG, ED   ____________________________________________   DIFFERENTIAL DIAGNOSIS   Overdose, dehydration, electrolyte abnormality, chronic pain, occult infection  FINAL ASSESSMENT AND PLAN  Depression   Plan: The patient had presented for possible overdose and involuntary commitment. Patient's labs are unremarkable.  While she is not suicidal and it is unclear if she took too much of her medications, she does appear severely depressed.  Patient states she cries all the time.  She may would benefit from hospitalization.  Psychiatric evaluation is pending.  Laurence Aly, MD    Note: This note was generated in part or whole with voice recognition software. Voice recognition is usually quite accurate but there are transcription errors that can and very often do occur. I apologize for any typographical errors that were not detected and corrected.     Earleen Newport, MD 02/02/18 [phone]

## 2018-02-02 NOTE — ED Notes (Signed)
Patient had incontinent episode of urine.

## 2018-02-02 NOTE — ED Notes (Signed)
BEHAVIORAL HEALTH ROUNDING Patient sleeping: Yes.   Patient alert and oriented: eyes closed  Appears asleep Behavior appropriate: Yes.  ; If no, describe:  Nutrition and fluids offered: Yes  Toileting and hygiene offered: sleeping Sitter present: q 15 minute observations and security monitoring Law enforcement present: yes  ODS 

## 2018-02-02 NOTE — ED Triage Notes (Signed)
Here IVC with [employer] [employer]. Brother took out [employer] that report she took a bunch of pills and was having trouble staying awake.  Pt states "I don't know if I took pills" when directly asked about this.  Pt denies SI/HI.

## 2018-02-02 NOTE — ED Notes (Signed)

## 2018-02-03 DIAGNOSIS — F13288 Sedative, 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, hypnotic or anxiolytic-induced disorder: Secondary | ICD-10-CM

## 2018-02-03 DIAGNOSIS — F1320 Sedative, hypnotic or anxiolytic dependence, uncomplicated: Secondary | ICD-10-CM

## 2018-02-03 DIAGNOSIS — F331 Major depressive disorder, recurrent, moderate: Secondary | ICD-10-CM

## 2018-02-03 LAB — URINE DRUG SCREEN, QUALITATIVE (ARMC ONLY)
Amphetamines, Ur Screen: NOT DETECTED
BENZODIAZEPINE, UR SCRN: POSITIVE — AB
Barbiturates, Ur Screen: NOT DETECTED
Cannabinoid 50 Ng, Ur ~~LOC~~: POSITIVE — AB
Cocaine Metabolite,Ur ~~LOC~~: NOT DETECTED
MDMA (Ecstasy)Ur Screen: NOT DETECTED
Methadone Scn, Ur: NOT DETECTED
Opiate, Ur Screen: NOT DETECTED
Phencyclidine (PCP) Ur S: NOT DETECTED
TRICYCLIC, UR SCREEN: NOT DETECTED

## 2018-02-03 NOTE — ED Notes (Signed)
Patient observed lying in bed with eyes closed  Even, unlabored respirations observed   NAD pt appears to be sleeping  I will continue to monitor along with every 15 minute visual observations and ongoing security monitoring    

## 2018-02-03 NOTE — ED Notes (Signed)
ED  Is the patient under IVC or is there intent for IVC: Yes.   Is the patient medically cleared: Yes.   Is there vacancy in the ED BHU: Yes.   Is the population mix appropriate for patient: Yes.   Is the patient awaiting placement in inpatient or outpatient setting:  Has the patient had a psychiatric consult:  Consult pending  Survey of unit performed for contraband, proper placement and condition of furniture, tampering with fixtures in bathroom, shower, and each patient room: Yes.  ; Findings:  APPEARANCE/BEHAVIOR Calm and cooperative NEURO ASSESSMENT Orientation: oriented x3  Denies pain Hallucinations: No.None noted (Hallucinations)  Denies  Speech: Normal Gait: normal  Unsteady at times   RESPIRATORY ASSESSMENT Even  Unlabored respirations  CARDIOVASCULAR ASSESSMENT Pulses equal   regular rate  Skin warm and dry   GASTROINTESTINAL ASSESSMENT no GI complaint EXTREMITIES Full ROM  PLAN OF CARE Provide calm/safe environment. Vital signs assessed twice daily. ED BHU Assessment once each 12-hour shift. Collaborate with TTS daily or as condition indicates. Assure the ED provider has rounded once each shift. Provide and encourage hygiene. Provide redirection as needed. Assess for escalating behavior; address immediately and inform ED provider.  Assess family dynamic and appropriateness for visitation as needed: Yes.  ; If necessary, describe findings:  Educate the patient/family about BHU procedures/visitation: Yes.  ; If necessary, describe findings:

## 2018-02-03 NOTE — ED Notes (Signed)
BEHAVIORAL HEALTH ROUNDING Patient sleeping: No. Patient alert and oriented: yes Behavior appropriate: Yes.  ; If no, describe:  Nutrition and fluids offered: yes Toileting and hygiene offered: Yes  Sitter present: q15 minute observations and security  monitoring Law enforcement present: Yes  ODS  

## 2018-02-03 NOTE — ED Notes (Signed)
Patient informed staff that she needed to use the restroom.  Patient was assisted.  No incontinent episode noted.

## 2018-02-03 NOTE — Consult Note (Signed)
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 female patient whose brother called for her to be transported to the emergency room due to becoming excessively somnolent at home after taking prescription medications.  I spoke with her brother who states that she was nodding off she dropped her cigarette on the ground he cannot keep her awake she kept falling back asleep so he called for EMS.  He does not think she was trying to harm herself but uses too much medication for her chronic pain and he agrees that she has been depressed.  HPI: Patient states she does not really need to be here if she is not that sleepy however her brother's description is quite clear that she was using excess medications.  Patient states that she takes Percocet Xanax Prozac Soma and ibuprofen.  She states she ran out of Soma 2 or 3 days ago so it would not of been Manuela Neptune that made her sleepy.  No UDS was obtained, patient denies any recent use of alcohol or illicit drugs or medications obtained without a prescription.  States she did smoke marijuana couple weeks ago.  States that it does help her with her pain.  She states she has ankylosing spondylitis pinched nerves in her neck and osteoarthritis and is in chronic pain all over her body.  She is oriented she denies suicidal ideation  she states she has been on Prozac for 10 or 15 years or longer and is not really sure it helps anymore.  She states that she is interested in following up with her PCP who prescribes her medications to consider alternatives.  She also describes feeling lonely states no one ever calls she does not go anywhere because of her chronic pain she stated home alone most of the time.  She does state that her brother now lives with her.  She volunteers without my asking that she knows she needs to taper off Xanax and she will talk with her prescribing physician Dr. Wynetta Emery about that.  However should be noted that during her time here in the emergency room she has been asking for Xanax.  She has a history of alcohol and cocaine use disorders which he states has not used in over a year's without history and chronic opioids would certainly make sense to get her off the Xanax.  By the time of my interview with her although she looks a bit disheveled she is cognitively clear she complains of chronic pain syndrome but is very clear that she does not have any suicidal ideation.  Patient's last soma prescription was 350 mg 3 times daily #30 she picked up on 01/25/2025 she used up all of them in about 6 days, taking about 5/day.  The last Percocet prescription was 4 times daily #20 picked up on 02/01/2018.  Her most recent Xanax prescription was 1 mg twice daily #60 which she picked up on 01/31/2018.  Past Psychiatric  History: Prior history of depression cocaine and alcohol use disorders which patient states are in remission for over a year.  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  02/01/2018  2   02/01/2018  Oxycodone-Acetaminophen 5-325  20.00 5 Darene Lamer   [id number]   Sou ([phone])   0  30.00 MME  Medicare   [place]  01/31/2018  2   01/31/2018  Alprazolam 1 MG Tablet  60.00 30 Darene Lamer   [id number]   Sou [phone])   0  4.00 LME  Medicare   [place]  01/25/2018  2   01/25/2018  Carisoprodol 350 MG Tablet  30.00 10 Darene Lamer   [id number]   Sou  [phone])   0  0.84 LME  Medicare   [place]  01/23/2018  2   01/23/2018  Oxycodone-Acetaminophen 5-325  20.00 20 Darene Lamer   [id number]   Sou [phone]   0  7.50 MME  Medicare   [place]  01/16/2018  2   01/07/2018  Carisoprodol 350 MG Tablet  30.00 10 Darene Lamer   [id number]   Sou [phone])   1  0.84 LME  Medicare   [place]  01/15/2018  2   01/15/2018  Oxycodone-Acetaminophen 5-325  20.00 5 Darene Lamer   [id number]   Sou ([phone])   0  30.00 MME  Medicare   [place]  01/09/2018  2   01/09/2018  Oxycodone-Acetaminophen 5-325  20.00 5 Darene Lamer   [id number]   Sou [phone])   0  30.00 MME  Medicare   [place]  01/07/2018  2   01/07/2018  Carisoprodol 350 MG Tablet  30.00 10 Jo Joh            Risk to Self:  No evidence of intentional self-harm however she misuses potentially dangerous medications Soma Xanax Percocet Risk to Others:   Prior Inpatient Therapy:   Prior Outpatient Therapy:    Past Medical History:  Past Medical History:  Diagnosis Date  . Ankylosing spondylitis ([place])   . Anxiety   . Arthritis   . Blood in stool   . Breast cancer ([place]) 2010   Left breast- chemo/radiation  . Chronic low back pain   . Chronic pain   . Depression   . Headache   . Hepatitis C 2007  . Hip fracture ([place])   . History of blood transfusion   . Hypertension   . Insomnia   . OA (osteoarthritis)   . Osteoporosis   . Personal history of chemotherapy   . Personal history of radiation therapy   . Spinal stenosis of lumbar region   . Spondyloarthritis     Past Surgical History:  Procedure Laterality Date  . BREAST BIOPSY Right 1983   neg  . BREAST EXCISIONAL BIOPSY Left 2010   triple neg  . BREAST LUMPECTOMY Left 2010  . FEMUR FRACTURE SURGERY    . FOOT SURGERY  2000 and 2003  . INTRAMEDULLARY (IM) NAIL INTERTROCHANTERIC Right 01/12/2015   Procedure: INTRAMEDULLARY (IM) NAIL INTERTROCHANTRIC;  Surgeon: Earnestine Leys, MD;  Location: [facility] ORS;  Service: Orthopedics;  Laterality: Right;  . KNEE SURGERY Left 2004  . TUBAL LIGATION     Family History:   Family History  Problem Relation Age of Onset  . Arthritis Mother   . Stroke Mother   . Heart disease Mother   . Hypertension Mother   . Hyperlipidemia Mother   . Depression Mother   . Seizures Mother   . Osteoporosis Mother   .  Hypertension Father   . Hypertension Brother   . Arthritis Brother   . Depression Brother   . Diabetes Brother   . Hyperlipidemia Brother   . Heart disease Maternal Aunt   . Breast cancer Maternal Aunt   . Seizures Sister   . Stroke Sister   . COPD Neg Hx   . Cancer Neg Hx    Family Psychiatric  History:  Social History:  Social History   Substance and Sexual Activity  Alcohol Use Yes   Comment: occasionally a glass of wine cooler     Social History   Substance and Sexual Activity  Drug Use Yes  . Types: Marijuana   Comment: occasionally     Social History   Socioeconomic History  . Marital status: Legally Separated    Spouse name: Not on file  . Number of children: Not on file  . Years of education: Not on file  . Highest education level: Not on file  Occupational History  . Not on file  Social Needs  . Financial resource strain: Not hard at all  . Food insecurity:    Worry: Never true    Inability: Never true  . Transportation needs:    Medical: Yes    Non-medical: Yes  Tobacco Use  . Smoking status: Current Every Day Smoker    Packs/day: 0.20    Years: 25.00    Pack years: 5.00    Types: Cigarettes  . Smokeless tobacco: Never Used  Substance and Sexual Activity  . Alcohol use: Yes    Comment: occasionally a glass of wine cooler  . Drug use: Yes    Types: Marijuana    Comment: occasionally   . Sexual activity: Not on file  Lifestyle  . Physical activity:    Days per week: 0 days    Minutes per session: 0 min  . Stress: Not at all  Relationships  . Social connections:    Talks on phone: More than three times a week    Gets together: Never    Attends religious service: Never    Active member of club or  organization: No    Attends meetings of clubs or organizations: Never    Relationship status: Separated  Other Topics Concern  . Not on file  Social History Narrative  . Not on file   Additional Social History:    Allergies:   Allergies  Allergen Reactions  . Tramadol Nausea Only  . Penicillins Rash and Other (See Comments)    Has patient had a PCN reaction causing immediate rash, facial/tongue/throat swelling, SOB or lightheadedness with hypotension: Yes Has patient had a PCN reaction causing severe rash involving mucus membranes or skin necrosis: No Has patient had a PCN reaction that required hospitalization No Has patient had a PCN reaction occurring within the last 10 years: No If all of the above answers are "NO", then may proceed with Cephalosporin use.     Labs:  Results for orders placed or performed during the hospital encounter of 02/02/18 (from the past 48 hour(s))  Comprehensive metabolic panel     Status: Abnormal   Collection Time: 02/02/18  3:35 PM  Result Value Ref Range   Sodium 136 135 - 145 mmol/L   Potassium 3.3 (L) 3.5 - 5.1 mmol/L   Chloride 104 98 - 111 mmol/L   CO2 23 22 - 32 mmol/L   Glucose, Bld 85 70 - 99 mg/dL   BUN 16 6 - 20 mg/dL  Creatinine, Ser 0.81 0.44 - 1.00 mg/dL   Calcium 9.1 8.9 - 10.3 mg/dL   Total Protein 7.5 6.5 - 8.1 g/dL   Albumin 4.0 3.5 - 5.0 g/dL   AST 15 15 - 41 U/L   ALT 10 0 - 44 U/L   Alkaline Phosphatase 55 38 - 126 U/L   Total Bilirubin 0.4 0.3 - 1.2 mg/dL   GFR calc non Af Amer >60 >60 mL/min   GFR calc Af Amer >60 >60 mL/min   Anion gap 9 5 - 15    Comment: Performed at [facility], [address] [place], [place] [address]  Ethanol     Status: None   Collection Time: 02/02/18  3:35 PM  Result Value Ref Range   Alcohol, Ethyl (B) <10 <10 mg/dL    Comment: (NOTE) Lowest detectable limit for serum alcohol is 10 mg/dL. For medical purposes only. Performed at [facility], [address] [place], [place] [address]   Salicylate level     Status: None   Collection Time: 02/02/18  3:35 PM  Result Value Ref Range   Salicylate Lvl <3.3 2.8 - 30.0 mg/dL    Comment: Performed at [facility], [address] [place], [place] [address]  Acetaminophen level     Status: None   Collection Time: 02/02/18  3:35 PM  Result Value Ref Range   Acetaminophen (Tylenol), Serum 20 10 - 30 ug/mL    Comment: (NOTE) Therapeutic concentrations vary significantly. A range of 10-30 ug/mL  may be an effective concentration for many patients. However, some  are best treated at concentrations outside of this range. Acetaminophen concentrations >150 ug/mL at 4 hours after ingestion  and >50 ug/mL at 12 hours after ingestion are often associated with  toxic reactions. Performed at [facility], [address] [place], [place] [address]   cbc     Status: Abnormal   Collection Time: 02/02/18  3:35 PM  Result Value Ref Range   WBC 7.6 4.0 - 10.5 K/uL   RBC 3.91 3.87 - 5.11 MIL/uL   Hemoglobin 11.4 (L) 12.0 - 15.0 g/dL   HCT 35.6 (L) 36.0 - 46.0 %   MCV 91.0 80.0 - 100.0 fL   MCH 29.2 26.0 - 34.0 pg   MCHC 32.0 30.0 - 36.0 g/dL   RDW 15.2 11.5 - 15.5 %   Platelets 315 150 - 400 K/uL   nRBC 0.0 0.0 - 0.2 %    Comment: Performed at [facility], [address] [place], [place] [address]    No current facility-administered medications for this encounter.    Current Outpatient Medications  Medication Sig Dispense Refill  . ALPRAZolam (XANAX) 1 MG tablet Take 1 tablet (1 mg total) by mouth 2 (two) times daily as needed for anxiety. 60 tablet 0  . busPIRone (BUSPAR) 30 MG tablet Take 1 tablet by mouth 2 (two) times daily.    . carisoprodol (SOMA) 350 MG tablet Take 1 tablet by mouth 3 (three) times daily as needed for muscle spasms.    Marland Kitchen FLUoxetine (PROZAC) 40 MG capsule Take 1 capsule (40 mg total) by mouth daily. 30 capsule 0  . furosemide (LASIX) 40 MG tablet  Take 1 tablet (40 mg total) by mouth daily. 30 tablet 3  . ibuprofen (ADVIL,MOTRIN) 800 MG tablet Take 1 tablet by mouth every 8 (eight) hours as needed for pain.    Marland Kitchen lansoprazole (PREVACID) 15 MG capsule Take 15 mg  by mouth daily as needed.    Marland Kitchen oxyCODONE-acetaminophen (PERCOCET/ROXICET) 5-325 MG tablet Take 1 tablet by mouth every 6 (six) hours as needed for pain. For up to 5 days    . potassium chloride (K-DUR) 10 MEQ tablet Take 1 tablet by mouth daily.    . traZODone (DESYREL) 100 MG tablet Take 2 tablets by mouth at bedtime.    Marland Kitchen doxycycline (VIBRA-TABS) 100 MG tablet Take 1 tablet (100 mg total) by mouth 2 (two) times daily. (Patient not taking: Reported on 02/02/2018) 20 tablet 0  . famotidine (PEPCID) 40 MG tablet Take 1 tablet (40 mg total) by mouth every evening. (Patient not taking: Reported on 02/02/2018) 30 tablet 1  . meloxicam (MOBIC) 15 MG tablet Take 15 mg by mouth daily.    Marland Kitchen oxyCODONE-acetaminophen (PERCOCET) 5-325 MG tablet Take 1 tablet by mouth every 6 (six) hours as needed for severe pain. (Patient not taking: Reported on 02/02/2018) 12 tablet 0  . traZODone (DESYREL) 150 MG tablet Take 1 tablet (150 mg total) by mouth at bedtime as needed for sleep. (Patient not taking: Reported on 02/02/2018) 90 tablet 1    Musculoskeletal: Strength & Muscle Tone: within normal limits Gait & Station: normal Patient leans: N/A  Psychiatric Specialty Exam: Physical Exam  ROS  Blood pressure (!) 148/82, pulse 70, temperature 97.9 F (36.6 C), resp. rate 15, height 5\' 1"  (1.549 m), weight 72.6 kg, SpO2 96 %.Body mass index is 30.23 kg/m.  General Appearance: Disheveled  Eye Contact:  Good  Speech:  Clear and Coherent  Volume:  Normal  Mood:  Depressed  Affect:  Congruent  Thought Process:  Coherent  Orientation:  Full (Time, Place, and Person)  Thought Content:  Logical  Suicidal Thoughts:  No  Homicidal Thoughts:  No  Memory:  Immediate;   Fair Recent;   Good Remote;   Good   Judgement:  Fair  Insight:  Fair  Psychomotor Activity:  Normal  Concentration:  Concentration: Good and Attention Span: Good  Recall:  Good  Fund of Knowledge:  Good  Language:  Good  Akathisia:  No  Handed:  Right  AIMS (if indicated):     Assets:  Communication Skills Housing Social Support  ADL's:  Intact  Cognition:  WNL  Sleep:        Treatment Plan Summary: Plan For safety I recommend discontinuing Soma and tapering off of Xanax if she is going to remain on the Percocet.  She would be interested in changing her antidepressant, Cymbalta might help some with her pain syndrome as well, she feels Prozac is no longer particularly helpful.  Recommend considering therapist and psychiatric follow-up, route note to Dr. Harrel Lemon.  Disposition: No evidence of imminent risk to self or others at present.   Patient does not meet criteria for psychiatric inpatient admission. Supportive therapy provided about ongoing stressors. Discussed crisis plan, support from social network, calling 911, coming to the Emergency Department, and calling Suicide Hotline.  Patience Musca, MD 02/03/2018 2:11 PM

## 2018-02-03 NOTE — ED Notes (Signed)

## 2018-02-03 NOTE — ED Provider Notes (Signed)
-----------------------------------------   4:10 PM on 02/03/2018 -----------------------------------------   Blood pressure (!) 148/82, pulse 70, temperature 97.9 F (36.6 C), resp. rate 15, height 5\' 1"  (1.549 m), weight 72.6 kg, SpO2 96 %.  The patient is calm and cooperative at this time.  There have been no acute events since the last update.  Patient taken off of involuntary commitment by Dr. Gerilyn Nestle.  Patient without any suicidal or homicidal ideation at this time.  Dr. Gerilyn Nestle has advised the patient to wean down her Xanax and follow-up with primary care.  Patient clinically sober at this time.   Janet Pyo, MD 02/03/18 [phone]

## 2018-02-03 NOTE — ED Provider Notes (Signed)
-----------------------------------------   7:13 AM on 02/03/2018 -----------------------------------------   Blood pressure (!) 153/84, pulse 68, temperature 97.9 F (36.6 C), temperature source Oral, resp. rate 16, height 5\' 1"  (1.549 m), weight 72.6 kg, SpO2 94 %.  The patient is calm and cooperative at this time.  There have been no acute events since the last update.  Awaiting disposition plan from Behavioral Medicine team.    Paulette Blanch, MD 02/03/18 [phone]

## 2018-03-24 ENCOUNTER — Emergency Department
Admission: EM | Admit: 2018-03-24 | Discharge: 2018-03-24 | Discharge status: Against Medical Advice | Payer: Medicare Other | Attending: Emergency Medicine | Admitting: Emergency Medicine

## 2018-03-24 ENCOUNTER — Other Ambulatory Visit: Payer: Self-pay

## 2018-03-24 DIAGNOSIS — I10 Essential (primary) hypertension: Secondary | ICD-10-CM | POA: Insufficient documentation

## 2018-03-24 DIAGNOSIS — Z79899 Other long term (current) drug therapy: Secondary | ICD-10-CM | POA: Diagnosis not present

## 2018-03-24 DIAGNOSIS — F17210 Nicotine dependence, cigarettes, uncomplicated: Secondary | ICD-10-CM | POA: Diagnosis not present

## 2018-03-24 DIAGNOSIS — R42 Dizziness and giddiness: Secondary | ICD-10-CM

## 2018-03-24 DIAGNOSIS — M542 Cervicalgia: Secondary | ICD-10-CM | POA: Diagnosis not present

## 2018-03-24 NOTE — ED Notes (Signed)
Pt is in lobby waiting for a taxi.

## 2018-03-24 NOTE — ED Triage Notes (Signed)
Pt arrived via [facility] with dizziness and neck pain. Pt was seen [facility] and pain clinics and pt states that she has slipped discs and a pinched nerve in her neck. Pt states that she gets different stories at diff places. Pt of kidney issues, arthritis and left side mastectomy. Pt also has been depressed and has been drinking 1 and 1/2 of four loko tonight.

## 2018-03-24 NOTE — ED Notes (Signed)
Pt comes into the hallway yelling and cursing stated she needs a phone to call someone to pick her up. Pt was given a phone.

## 2018-03-24 NOTE — ED Provider Notes (Addendum)
[facility] Emergency Department Provider Note   ____________________________________________   I have reviewed the triage vital signs and the nursing notes.   HISTORY  Chief Complaint Dizziness   History limited by and level 5 caveat secondary to   HPI Janet Zuniga is a 63 y.o. female who presents to the emergency department today because of concerns for dizziness and neck pain.  The patient initially states the symptoms have been going on for weeks.  She initially stated that she has been seen both in [place] for these symptoms.  She states that at one place they said she had a blockage and then another placing since she did not have a blockage.  She then stated however that she had not discussed her dizziness with them.  She states this is been going on for a week.  She states that she came to the emergency department because she wants somebody to tell her what is going on.  Records reviewed. Per medical record review patient has a history of anxiety, HTN, appointment with primary care tomorrow.   Past Medical History:  Diagnosis Date  . Ankylosing spondylitis ([place])   . Anxiety   . Arthritis   . Blood in stool   . Breast cancer ([place]) 2010   Left breast- chemo/radiation  . Chronic low back pain   . Chronic pain   . Depression   . Headache   . Hepatitis C 2007  . Hip fracture ([place])   . History of blood transfusion   . Hypertension   . Insomnia   . OA (osteoarthritis)   . Osteoporosis   . Personal history of chemotherapy   . Personal history of radiation therapy   . Spinal stenosis of lumbar region   . Spondyloarthritis     Patient Active Problem List   Diagnosis Date Noted  . 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 Depression, major, recurrent, moderate ([place]) 02/03/2018  . 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 Degenerative  disc disease, lumbar 09/20/2016  . Shoulder joint pain 09/18/2016  . Hypertension   . Ankylosing spondylitis ([place])   . OA (osteoarthritis)   . Osteoporosis   . Malignant neoplasm of left female breast ([place]) 09/12/2015  . Cocaine abuse ([place]) 08/18/2015  . Alcohol abuse 08/03/2015  . Chronic use of opiate drugs therapeutic purposes 05/19/2015  . Leg reflex sympathetic dystrophy, right 05/19/2015  . Fracture of shaft of femur ([place]) 01/12/2015  . Anxiety 01/12/2015  . Anxiety and depression 12/24/2014  . Chronic bilateral low back pain with bilateral sciatica 12/24/2014  . Substance induced mood disorder ([place]) 10/27/2014  . Chronic pain in right foot 10/20/2014  . Insomnia 06/05/2014  . Chronic pain syndrome 05/15/2014  . Raynaud's phenomenon without gangrene 05/15/2014  . Spinal stenosis of lumbar region 05/15/2014  . 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 Generalized anxiety disorder 05/04/2014  . Hepatitis C 03/12/2014  . Midline low back pain without sciatica 03/12/2014  . History of breast cancer in female 12/23/2013  . Chronic pain 12/23/2013  . Ankylosing spondylitis of lumbosacral region ([place]) 12/23/2013  . Depression 12/23/2013  . Osteoarthritis 10/05/2011    Past Surgical History:  Procedure Laterality Date  . BREAST BIOPSY Right 1983   neg  . BREAST EXCISIONAL BIOPSY Left 2010   triple neg  . BREAST LUMPECTOMY Left 2010  . FEMUR FRACTURE SURGERY    . FOOT SURGERY  2000 and  2003  . INTRAMEDULLARY (IM) NAIL INTERTROCHANTERIC Right 01/12/2015   Procedure: INTRAMEDULLARY (IM) NAIL INTERTROCHANTRIC;  Surgeon: Earnestine Leys, MD;  Location: [facility] ORS;  Service: Orthopedics;  Laterality: Right;  . KNEE SURGERY Left 2004  . TUBAL LIGATION      Prior to Admission medications   Medication Sig Start Date End Date Taking? Authorizing Provider  ALPRAZolam Duanne Moron) 1 MG tablet Take 1 tablet (1 mg total) by mouth 2 (two) times daily as needed for anxiety. 04/18/17   Volney American, PA-C  busPIRone (BUSPAR) 30 MG tablet Take 1 tablet by mouth 2 (two) times daily. 07/18/17   [provider]  carisoprodol (SOMA) 350 MG tablet Take 1 tablet by mouth 3 (three) times daily as needed for muscle spasms. 01/25/18   [provider]  doxycycline (VIBRA-TABS) 100 MG tablet Take 1 tablet (100 mg total) by mouth 2 (two) times daily. Patient not taking: Reported on 02/02/2018 08/20/17   Jacquelin Hawking, NP  famotidine (PEPCID) 40 MG tablet Take 1 tablet (40 mg total) by mouth every evening. Patient not taking: Reported on 02/02/2018 06/12/16 02/02/18  Nance Pear, MD  FLUoxetine (PROZAC) 40 MG capsule Take 1 capsule (40 mg total) by mouth daily. 04/25/17   Volney American, PA-C  furosemide (LASIX) 40 MG tablet Take 1 tablet (40 mg total) by mouth daily. 04/27/17   Volney American, PA-C  ibuprofen (ADVIL,MOTRIN) 800 MG tablet Take 1 tablet by mouth every 8 (eight) hours as needed for pain. 01/16/18   [provider]  lansoprazole (PREVACID) 15 MG capsule Take 15 mg by mouth daily as needed.    [provider]  meloxicam (MOBIC) 15 MG tablet Take 15 mg by mouth daily.    [provider]  oxyCODONE-acetaminophen (PERCOCET) 5-325 MG tablet Take 1 tablet by mouth every 6 (six) hours as needed for severe pain. Patient not taking: Reported on 02/02/2018 01/02/18   Sable Feil, PA-C  potassium chloride (K-DUR) 10 MEQ tablet Take 1 tablet by mouth daily. 09/14/17 09/14/18  [provider]  traZODone (DESYREL) 100 MG tablet Take 2 tablets by mouth at bedtime. 01/24/18   [provider]  traZODone (DESYREL) 150 MG tablet Take 1 tablet (150 mg total) by mouth at bedtime as needed for sleep. Patient not taking: Reported on 02/02/2018 04/25/17   Volney American, PA-C    Allergies Tramadol and Penicillins  Family History  Problem Relation Age of Onset  . Arthritis Mother   . Stroke Mother   . Heart disease Mother    . Hypertension Mother   . Hyperlipidemia Mother   . Depression Mother   . Seizures Mother   . Osteoporosis Mother   . Hypertension Father   . Hypertension Brother   . Arthritis Brother   . Depression Brother   . Diabetes Brother   . Hyperlipidemia Brother   . Heart disease Maternal Aunt   . Breast cancer Maternal Aunt   . Seizures Sister   . Stroke Sister   . COPD Neg Hx   . Cancer Neg Hx     Social History Social History   Tobacco Use  . Smoking status: Current Every Day Smoker    Packs/day: 0.20    Years: 25.00    Pack years: 5.00    Types: Cigarettes  . Smokeless tobacco: Never Used  Substance Use Topics  . Alcohol use: Yes    Comment: occasionally a glass of wine cooler  . Drug  use: Yes    Types: Marijuana    Comment: occasionally     Review of Systems Constitutional: No fever/chills Eyes: No visual changes. ENT: No sore throat. Cardiovascular: Denies chest pain. Respiratory: Denies shortness of breath. Gastrointestinal: No abdominal pain.  No nausea, no vomiting.  No diarrhea.   Genitourinary: Negative for dysuria. Musculoskeletal: Positive for neck pain. Skin: Negative for rash. Neurological: Positive for dizziness.   ____________________________________________   PHYSICAL EXAM:  VITAL SIGNS: ED Triage Vitals  Enc Vitals Group     BP 03/24/18 2125 105/77     Pulse Rate 03/24/18 2125 80     Resp 03/24/18 2125 16     Temp 03/24/18 2125 98.1 F (36.7 C)     Temp Source 03/24/18 2125 Oral     SpO2 03/24/18 2125 96 %     Weight 03/24/18 2124 165 lb (74.8 kg)     Height 03/24/18 2124 5\' 1"  (1.549 m)     Head Circumference --      Peak Flow --      Pain Score 03/24/18 2124 9     Pain Loc --      Pain Edu? --      Excl. in [place]? --    Patient refused physical exam  ____________________________________________    LABS (pertinent positives/negatives)  None  ____________________________________________    EKG  I, Nance Pear,  attending physician, personally viewed and interpreted this EKG  EKG Time: 2137 Rate: 80 Rhythm: sinus rhythm Axis: normal Intervals: qtc 589 QRS: low voltage precordial leads ST changes: no st elevation Impression: abnormal ekg    ____________________________________________    RADIOLOGY  None  ____________________________________________   PROCEDURES  Procedures  ____________________________________________   INITIAL IMPRESSION / ASSESSMENT AND PLAN / ED COURSE  Pertinent labs & imaging results that were available during my care of the patient were reviewed by me and considered in my medical decision making (see chart for details).   Patient presented to the emergency department today because of concerns for dizziness and neck pain.  Sounds like the symptoms both have at least been going on for 1 week but perhaps longer.  During history taking the patient became upset as I tried to clarify how long symptoms are going on for and who she is seen for this.  She stated that she would like to go and leave this "Band-Aid station ". Patient does have an appointment with her primary care physician already scheduled tomorrow. Did discuss that I would like to help her as best that we can here in the emergency department. However patient was insistent that she go home.  ____________________________________________   FINAL CLINICAL IMPRESSION(S) / ED DIAGNOSES  Final diagnoses:  Dizziness     Note: This dictation was prepared with Dragon dictation. Any transcriptional errors that result from this process are unintentional     Nance Pear, MD 03/24/18 [place], [place], MD 03/24/18 [phone]

## 2018-03-24 NOTE — ED Notes (Signed)
Pt stated that she needs a urine cup and that we should be testing her urine because she has kidney problems. Pt is given a urine cup at this time. Pt is yelling at staff but is informed that they will not be tolerated.

## 2018-03-26 ENCOUNTER — Other Ambulatory Visit: Payer: Self-pay | Admitting: Family Medicine

## 2018-03-26 ENCOUNTER — Other Ambulatory Visit (HOSPITAL_COMMUNITY): Payer: Self-pay | Admitting: Family Medicine

## 2018-03-26 DIAGNOSIS — M4850XS Collapsed vertebra, not elsewhere classified, site unspecified, sequela of fracture: Secondary | ICD-10-CM

## 2018-03-26 DIAGNOSIS — I6521 Occlusion and stenosis of right carotid artery: Secondary | ICD-10-CM

## 2018-04-24 ENCOUNTER — Ambulatory Visit: Payer: Medicare Other

## 2018-05-03 ENCOUNTER — Ambulatory Visit: Payer: Medicare Other

## 2018-05-09 ENCOUNTER — Ambulatory Visit: Payer: Self-pay | Admitting: Oncology

## 2018-05-28 ENCOUNTER — Ambulatory Visit: Payer: Medicare Other

## 2018-06-20 ENCOUNTER — Ambulatory Visit: Payer: Medicare Other

## 2018-08-10 NOTE — Progress Notes (Deleted)
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  INTERVAL HISTORY: Patient returns to clinic today for routine yearly follow-up.  She has some increased anxiety and depression since the death of her mother several months ago.  She also states she is having significant back pain secondary to her ankylosing spondylitis. She continues to have high anxiety and multiple social issues.  She denies any chest pain, shortness of breath, or hemoptysis. She denies any abdominal pain. She denies any nausea, vomiting, constipation, or diarrhea. She has no urinary complaints. She denies any easy bleeding or bruising.  Patient feels generally terrible, but offers no further specific complaints.  REVIEW OF SYSTEMS:   Review of Systems  Constitutional: Negative for fever, malaise/fatigue and weight loss.  Respiratory: Negative.  Negative for cough and shortness of breath.   Cardiovascular: Negative.  Negative for chest pain and leg swelling.  Gastrointestinal: Negative.  Negative for abdominal pain.  Genitourinary: Negative.  Negative for dysuria.  Musculoskeletal: Positive for back pain and joint pain.  Neurological: Negative.  Negative for sensory change, focal weakness and weakness.  Psychiatric/Behavioral: Positive for depression. The patient is nervous/anxious.     As per HPI. Otherwise, a complete review of systems is negative.  PAST MEDICAL HISTORY: Past Medical History:  Diagnosis Date  . Ankylosing spondylitis (Janet Zuniga)   . Anxiety   . Arthritis   . Blood in stool   . Breast cancer (Janet Zuniga) 2010   Left breast- chemo/radiation  . Chronic low back pain   . Chronic pain   . Depression   . Headache   . Hepatitis C 2007  . Hip  fracture (Janet Zuniga)   . History of blood transfusion   . Hypertension   . Insomnia   . OA (osteoarthritis)   . Osteoporosis   . Personal history of chemotherapy   . Personal history of radiation therapy   . Spinal stenosis of lumbar region   . Spondyloarthritis     PAST SURGICAL HISTORY: Past Surgical History:  Procedure Laterality Date  . BREAST BIOPSY Right 1983   neg  . BREAST EXCISIONAL BIOPSY Left 2010   triple neg  . BREAST LUMPECTOMY Left 2010  . FEMUR FRACTURE SURGERY    . FOOT SURGERY  2000 and 2003  . INTRAMEDULLARY (IM) NAIL INTERTROCHANTERIC Right 01/12/2015   Procedure: INTRAMEDULLARY (IM) NAIL INTERTROCHANTRIC;  Surgeon: Janet Leys, MD;  Location: [facility] ORS;  Service: Orthopedics;  Laterality: Right;  . KNEE SURGERY Left 2004  . TUBAL LIGATION      FAMILY HISTORY Family History  Problem Relation Age of Onset  . Arthritis Mother   . Stroke Mother   . Heart disease Mother   . Hypertension Mother   . Hyperlipidemia Mother   . Depression Mother   . Seizures Mother   . Osteoporosis Mother   . Hypertension Father   . Hypertension Brother   . Arthritis Brother   . Depression Brother   . Diabetes Brother   . Hyperlipidemia Brother   . Heart disease Maternal Aunt   . Breast cancer Maternal Aunt   . Seizures Sister   . Stroke Sister   . COPD Neg Hx   . Cancer Neg Hx        ADVANCED DIRECTIVES:  HEALTH MAINTENANCE: Social History   Tobacco Use  . Smoking status: Current Every Day Smoker    Packs/day: 0.20    Years: 25.00    Pack years: 5.00    Types: Cigarettes  . Smokeless tobacco: Never Used  Substance Use Topics  . Alcohol use: Yes    Comment: occasionally a glass of wine cooler  . Drug use: Yes    Types: Marijuana    Comment: occasionally      Colonoscopy:  PAP:  Bone density:  Lipid panel:  Allergies  Allergen Reactions  . Tramadol Nausea Only  . Penicillins Rash and Other (See Comments)    Has patient had a PCN reaction  causing immediate rash, facial/tongue/throat swelling, SOB or lightheadedness with hypotension: Yes Has patient had a PCN reaction causing severe rash involving mucus membranes or skin necrosis: No Has patient had a PCN reaction that required hospitalization No Has patient had a PCN reaction occurring within the last 10 years: No If all of the above answers are "NO", then may proceed with Cephalosporin use.     Current Outpatient Medications  Medication Sig Dispense Refill  . ALPRAZolam (XANAX) 1 MG tablet Take 1 tablet (1 mg total) by mouth 2 (two) times daily as needed for anxiety. 60 tablet 0  . busPIRone (BUSPAR) 30 MG tablet Take 1 tablet by mouth 2 (two) times daily.    . carisoprodol (SOMA) 350 MG tablet Take 1 tablet by mouth 3 (three) times daily as needed for muscle spasms.    Marland Kitchen doxycycline (VIBRA-TABS) 100 MG tablet Take 1 tablet (100 mg total) by mouth 2 (two) times daily. (Patient not taking: Reported on 02/02/2018) 20 tablet 0  . famotidine (PEPCID) 40 MG tablet Take 1 tablet (40 mg total) by mouth every evening. (Patient not taking: Reported on 02/02/2018) 30 tablet 1  . FLUoxetine (PROZAC) 40 MG capsule Take 1 capsule (40 mg total) by mouth daily. 30 capsule 0  . furosemide (LASIX) 40 MG tablet Take 1 tablet (40 mg total) by mouth daily. 30 tablet 3  . ibuprofen (ADVIL,MOTRIN) 800 MG tablet Take 1 tablet by mouth every 8 (eight) hours as needed for pain.    Marland Kitchen lansoprazole (PREVACID) 15 MG capsule Take 15 mg by mouth daily as needed.    . meloxicam (MOBIC) 15 MG tablet Take 15 mg by mouth daily.    Marland Kitchen oxyCODONE-acetaminophen (PERCOCET) 5-325 MG tablet Take 1 tablet by mouth every 6 (six) hours as needed for severe pain. (Patient not taking: Reported on 02/02/2018) 12 tablet 0  . potassium chloride (K-DUR) 10 MEQ tablet Take 1 tablet by mouth daily.    . traZODone (DESYREL) 100 MG tablet Take 2 tablets by mouth at bedtime.    . traZODone (DESYREL) 150 MG tablet Take 1 tablet (150 mg  total) by mouth at bedtime as needed for sleep. (Patient not taking: Reported on 02/02/2018) 90 tablet 1   No current facility-administered medications for this visit.     OBJECTIVE: There were no vitals filed for this visit.   There is no height or weight on file to calculate BMI.    ECOG FS:0 - Asymptomatic  General: Well-developed, well-nourished, no acute distress. Eyes: Pink conjunctiva, anicteric sclera. Breast: Patient declined breast exam today. Lungs: Clear to auscultation bilaterally. Heart: Regular rate and rhythm. No rubs, murmurs, or gallops. Abdomen: Soft, nontender, nondistended. No organomegaly noted, normoactive bowel sounds. Musculoskeletal: No edema, cyanosis, or clubbing. Neuro: Alert, answering all questions appropriately. Cranial nerves  grossly intact. Skin: No rashes or petechiae noted. Psych: Normal affect.  LAB RESULTS:  Lab Results  Component Value Date   NA 136 02/02/2018   K 3.3 (L) 02/02/2018   CL 104 02/02/2018   CO2 23 02/02/2018   GLUCOSE 85 02/02/2018   BUN 16 02/02/2018   CREATININE 0.81 02/02/2018   CALCIUM 9.1 02/02/2018   PROT 7.5 02/02/2018   ALBUMIN 4.0 02/02/2018   AST 15 02/02/2018   ALT 10 02/02/2018   ALKPHOS 55 02/02/2018   BILITOT 0.4 02/02/2018   GFRNONAA >60 02/02/2018   GFRAA >60 02/02/2018    Lab Results  Component Value Date   WBC 7.6 02/02/2018   NEUTROABS 6.1 01/02/2018   HGB 11.4 (L) 02/02/2018   HCT 35.6 (L) 02/02/2018   MCV 91.0 02/02/2018   PLT 315 02/02/2018   Lab Results  Component Value Date   LABCA2 31.3 03/07/2016     STUDIES: No results found.  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  PLAN:    1.  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 Patient completed all of her chemotherapy in December 2010 and her adjuvant XRT in the Spring of 2011. Patient's most recent mammogram on June 16, 2016 was reported as BI-RADS 1.  Repeat in June 2019.  After which she likely can be discharged from clinic.  Return to clinic in 1 year for further evaluation 2.  Anxiety: Patient will no longer receive Xanax from this clinic. 3.  Ankylosing spondylitis: Treatment for her primary rheumatologist.  Patient should not receive narcotics from this clinic.  4.  Hepatitis C: Patient reports this has resolved with treatment. 5.  Previous suicide attempts: No refills on Xanax or narcotics as above. 6.  Pain: No narcotics or treatment from this clinic as above.  Patient has been instructed to call her rheumatologist for further evaluation.  Approximately 20 minutes was spent in discussion of which greater than 50% was consultation.  Patient expressed understanding and was in agreement with this plan. She also understands that She can call clinic at any time with any questions, concerns, or complaints.    Lloyd Huger, MD   08/10/2018 10:20 AM

## 2018-08-12 ENCOUNTER — Inpatient Hospital Stay: Payer: Medicare Other | Admitting: Oncology

## 2018-08-30 ENCOUNTER — Inpatient Hospital Stay: Payer: Medicare Other | Admitting: Oncology

## 2018-09-06 ENCOUNTER — Other Ambulatory Visit: Payer: Self-pay | Admitting: Internal Medicine

## 2018-09-06 DIAGNOSIS — G8929 Other chronic pain: Secondary | ICD-10-CM

## 2018-09-06 NOTE — Progress Notes (Deleted)
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  INTERVAL HISTORY: Patient returns to clinic today for routine yearly follow-up.  She has some increased anxiety and depression since the death of her mother several months ago.  She also states she is having significant back pain secondary to her ankylosing spondylitis. She continues to have high anxiety and multiple social issues.  She denies any chest pain, shortness of breath, or hemoptysis. She denies any abdominal pain. She denies any nausea, vomiting, constipation, or diarrhea. She has no urinary complaints. She denies any easy bleeding or bruising.  Patient feels generally terrible, but offers no further specific complaints.  REVIEW OF SYSTEMS:   Review of Systems  Constitutional: Negative for fever, malaise/fatigue and weight loss.  Respiratory: Negative.  Negative for cough and shortness of breath.   Cardiovascular: Negative.  Negative for chest pain and leg swelling.  Gastrointestinal: Negative.  Negative for abdominal pain.  Genitourinary: Negative.  Negative for dysuria.  Musculoskeletal: Positive for back pain and joint pain.  Neurological: Negative.  Negative for sensory change, focal weakness and weakness.  Psychiatric/Behavioral: Positive for depression. The patient is nervous/anxious.     As per HPI. Otherwise, a complete review of systems is negative.  PAST MEDICAL HISTORY: Past Medical History:  Diagnosis Date  . Ankylosing spondylitis ([place])   . Anxiety   . Arthritis   . Blood in stool   . Breast cancer ([place]) 2010   Left breast- chemo/radiation  . Chronic low back pain   . Chronic pain   . Depression   . Headache   . Hepatitis C 2007  . Hip  fracture ([place])   . History of blood transfusion   . Hypertension   . Insomnia   . OA (osteoarthritis)   . Osteoporosis   . Personal history of chemotherapy   . Personal history of radiation therapy   . Spinal stenosis of lumbar region   . Spondyloarthritis     PAST SURGICAL HISTORY: Past Surgical History:  Procedure Laterality Date  . BREAST BIOPSY Right 1983   neg  . BREAST EXCISIONAL BIOPSY Left 2010   triple neg  . BREAST LUMPECTOMY Left 2010  . FEMUR FRACTURE SURGERY    . FOOT SURGERY  2000 and 2003  . INTRAMEDULLARY (IM) NAIL INTERTROCHANTERIC Right 01/12/2015   Procedure: INTRAMEDULLARY (IM) NAIL INTERTROCHANTRIC;  Surgeon: Earnestine Leys, MD;  Location: [facility] ORS;  Service: Orthopedics;  Laterality: Right;  . KNEE SURGERY Left 2004  . TUBAL LIGATION      FAMILY HISTORY Family History  Problem Relation Age of Onset  . Arthritis Mother   . Stroke Mother   . Heart disease Mother   . Hypertension Mother   . Hyperlipidemia Mother   . Depression Mother   . Seizures Mother   . Osteoporosis Mother   . Hypertension Father   . Hypertension Brother   . Arthritis Brother   . Depression Brother   . Diabetes Brother   . Hyperlipidemia Brother   . Heart disease Maternal Aunt   . Breast cancer Maternal Aunt   . Seizures Sister   . Stroke Sister   . COPD Neg Hx   . Cancer Neg Hx        ADVANCED DIRECTIVES:  HEALTH MAINTENANCE: Social History   Tobacco Use  . Smoking status: Current Every Day Smoker    Packs/day: 0.20    Years: 25.00    Pack years: 5.00    Types: Cigarettes  . Smokeless tobacco: Never Used  Substance Use Topics  . Alcohol use: Yes    Comment: occasionally a glass of wine cooler  . Drug use: Yes    Types: Marijuana    Comment: occasionally      Colonoscopy:  PAP:  Bone density:  Lipid panel:  Allergies  Allergen Reactions  . Tramadol Nausea Only  . Penicillins Rash and Other (See Comments)    Has patient had a PCN reaction  causing immediate rash, facial/tongue/throat swelling, SOB or lightheadedness with hypotension: Yes Has patient had a PCN reaction causing severe rash involving mucus membranes or skin necrosis: No Has patient had a PCN reaction that required hospitalization No Has patient had a PCN reaction occurring within the last 10 years: No If all of the above answers are "NO", then may proceed with Cephalosporin use.     Current Outpatient Medications  Medication Sig Dispense Refill  . ALPRAZolam (XANAX) 1 MG tablet Take 1 tablet (1 mg total) by mouth 2 (two) times daily as needed for anxiety. 60 tablet 0  . busPIRone (BUSPAR) 30 MG tablet Take 1 tablet by mouth 2 (two) times daily.    . carisoprodol (SOMA) 350 MG tablet Take 1 tablet by mouth 3 (three) times daily as needed for muscle spasms.    Marland Kitchen doxycycline (VIBRA-TABS) 100 MG tablet Take 1 tablet (100 mg total) by mouth 2 (two) times daily. (Patient not taking: Reported on 02/02/2018) 20 tablet 0  . famotidine (PEPCID) 40 MG tablet Take 1 tablet (40 mg total) by mouth every evening. (Patient not taking: Reported on 02/02/2018) 30 tablet 1  . FLUoxetine (PROZAC) 40 MG capsule Take 1 capsule (40 mg total) by mouth daily. 30 capsule 0  . furosemide (LASIX) 40 MG tablet Take 1 tablet (40 mg total) by mouth daily. 30 tablet 3  . ibuprofen (ADVIL,MOTRIN) 800 MG tablet Take 1 tablet by mouth every 8 (eight) hours as needed for pain.    Marland Kitchen lansoprazole (PREVACID) 15 MG capsule Take 15 mg by mouth daily as needed.    . meloxicam (MOBIC) 15 MG tablet Take 15 mg by mouth daily.    Marland Kitchen oxyCODONE-acetaminophen (PERCOCET) 5-325 MG tablet Take 1 tablet by mouth every 6 (six) hours as needed for severe pain. (Patient not taking: Reported on 02/02/2018) 12 tablet 0  . potassium chloride (K-DUR) 10 MEQ tablet Take 1 tablet by mouth daily.    . traZODone (DESYREL) 100 MG tablet Take 2 tablets by mouth at bedtime.    . traZODone (DESYREL) 150 MG tablet Take 1 tablet (150 mg  total) by mouth at bedtime as needed for sleep. (Patient not taking: Reported on 02/02/2018) 90 tablet 1   No current facility-administered medications for this visit.     OBJECTIVE: There were no vitals filed for this visit.   There is no height or weight on file to calculate BMI.    ECOG FS:0 - Asymptomatic  General: Well-developed, well-nourished, no acute distress. Eyes: Pink conjunctiva, anicteric sclera. Breast: Patient declined breast exam today. Lungs: Clear to auscultation bilaterally. Heart: Regular rate and rhythm. No rubs, murmurs, or gallops. Abdomen: Soft, nontender, nondistended. No organomegaly noted, normoactive bowel sounds. Musculoskeletal: No edema, cyanosis, or clubbing. Neuro: Alert, answering all questions appropriately. Cranial nerves  grossly intact. Skin: No rashes or petechiae noted. Psych: Normal affect.  LAB RESULTS:  Lab Results  Component Value Date   NA 136 02/02/2018   K 3.3 (L) 02/02/2018   CL 104 02/02/2018   CO2 23 02/02/2018   GLUCOSE 85 02/02/2018   BUN 16 02/02/2018   CREATININE 0.81 02/02/2018   CALCIUM 9.1 02/02/2018   PROT 7.5 02/02/2018   ALBUMIN 4.0 02/02/2018   AST 15 02/02/2018   ALT 10 02/02/2018   ALKPHOS 55 02/02/2018   BILITOT 0.4 02/02/2018   GFRNONAA >60 02/02/2018   GFRAA >60 02/02/2018    Lab Results  Component Value Date   WBC 7.6 02/02/2018   NEUTROABS 6.1 01/02/2018   HGB 11.4 (L) 02/02/2018   HCT 35.6 (L) 02/02/2018   MCV 91.0 02/02/2018   PLT 315 02/02/2018   Lab Results  Component Value Date   LABCA2 31.3 03/07/2016     STUDIES: No results found.  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  PLAN:    1.  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 Patient completed all of her chemotherapy in December 2010 and her adjuvant XRT in the Spring of 2011. Patient's most recent mammogram on June 16, 2016 was reported as BI-RADS 1.  Repeat in June 2019.  After which she likely can be discharged from clinic.  Return to clinic in 1 year for further evaluation 2.  Anxiety: Patient will no longer receive Xanax from this clinic. 3.  Ankylosing spondylitis: Treatment for her primary rheumatologist.  Patient should not receive narcotics from this clinic.  4.  Hepatitis C: Patient reports this has resolved with treatment. 5.  Previous suicide attempts: No refills on Xanax or narcotics as above. 6.  Pain: No narcotics or treatment from this clinic as above.  Patient has been instructed to call her rheumatologist for further evaluation.  Approximately 20 minutes was spent in discussion of which greater than 50% was consultation.  Patient expressed understanding and was in agreement with this plan. She also understands that She can call clinic at any time with any questions, concerns, or complaints.    Lloyd Huger, MD   09/06/2018 3:43 PM

## 2018-09-17 ENCOUNTER — Inpatient Hospital Stay: Payer: Medicare Other | Admitting: Oncology

## 2018-09-19 ENCOUNTER — Ambulatory Visit: Payer: Medicare Other

## 2018-09-19 NOTE — Progress Notes (Deleted)
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  INTERVAL HISTORY: Patient returns to clinic today for routine yearly follow-up.  She has some increased anxiety and depression since the death of her mother several months ago.  She also states she is having significant back pain secondary to her ankylosing spondylitis. She continues to have high anxiety and multiple social issues.  She denies any chest pain, shortness of breath, or hemoptysis. She denies any abdominal pain. She denies any nausea, vomiting, constipation, or diarrhea. She has no urinary complaints. She denies any easy bleeding or bruising.  Patient feels generally terrible, but offers no further specific complaints.  REVIEW OF SYSTEMS:   Review of Systems  Constitutional: Negative for fever, malaise/fatigue and weight loss.  Respiratory: Negative.  Negative for cough and shortness of breath.   Cardiovascular: Negative.  Negative for chest pain and leg swelling.  Gastrointestinal: Negative.  Negative for abdominal pain.  Genitourinary: Negative.  Negative for dysuria.  Musculoskeletal: Positive for back pain and joint pain.  Neurological: Negative.  Negative for sensory change, focal weakness and weakness.  Psychiatric/Behavioral: Positive for depression. The patient is nervous/anxious.     As per HPI. Otherwise, a complete review of systems is negative.  PAST MEDICAL HISTORY: Past Medical History:  Diagnosis Date  . Ankylosing spondylitis ([place])   . Anxiety   . Arthritis   . Blood in stool   . Breast cancer ([place]) 2010   Left breast- chemo/radiation  . Chronic low back pain   . Chronic pain   . Depression   . Headache   . Hepatitis C 2007  . Hip  fracture ([place])   . History of blood transfusion   . Hypertension   . Insomnia   . OA (osteoarthritis)   . Osteoporosis   . Personal history of chemotherapy   . Personal history of radiation therapy   . Spinal stenosis of lumbar region   . Spondyloarthritis     PAST SURGICAL HISTORY: Past Surgical History:  Procedure Laterality Date  . BREAST BIOPSY Right 1983   neg  . BREAST EXCISIONAL BIOPSY Left 2010   triple neg  . BREAST LUMPECTOMY Left 2010  . FEMUR FRACTURE SURGERY    . FOOT SURGERY  2000 and 2003  . INTRAMEDULLARY (IM) NAIL INTERTROCHANTERIC Right 01/12/2015   Procedure: INTRAMEDULLARY (IM) NAIL INTERTROCHANTRIC;  Surgeon: Earnestine Leys, MD;  Location: [facility] ORS;  Service: Orthopedics;  Laterality: Right;  . KNEE SURGERY Left 2004  . TUBAL LIGATION      FAMILY HISTORY Family History  Problem Relation Age of Onset  . Arthritis Mother   . Stroke Mother   . Heart disease Mother   . Hypertension Mother   . Hyperlipidemia Mother   . Depression Mother   . Seizures Mother   . Osteoporosis Mother   . Hypertension Father   . Hypertension Brother   . Arthritis Brother   . Depression Brother   . Diabetes Brother   . Hyperlipidemia Brother   . Heart disease Maternal Aunt   . Breast cancer Maternal Aunt   . Seizures Sister   . Stroke Sister   . COPD Neg Hx   . Cancer Neg Hx        ADVANCED DIRECTIVES:  HEALTH MAINTENANCE: Social History   Tobacco Use  . Smoking status: Current Every Day Smoker    Packs/day: 0.20    Years: 25.00    Pack years: 5.00    Types: Cigarettes  . Smokeless tobacco: Never Used  Substance Use Topics  . Alcohol use: Yes    Comment: occasionally a glass of wine cooler  . Drug use: Yes    Types: Marijuana    Comment: occasionally      Colonoscopy:  PAP:  Bone density:  Lipid panel:  Allergies  Allergen Reactions  . Tramadol Nausea Only  . Penicillins Rash and Other (See Comments)    Has patient had a PCN reaction  causing immediate rash, facial/tongue/throat swelling, SOB or lightheadedness with hypotension: Yes Has patient had a PCN reaction causing severe rash involving mucus membranes or skin necrosis: No Has patient had a PCN reaction that required hospitalization No Has patient had a PCN reaction occurring within the last 10 years: No If all of the above answers are "NO", then may proceed with Cephalosporin use.     Current Outpatient Medications  Medication Sig Dispense Refill  . ALPRAZolam (XANAX) 1 MG tablet Take 1 tablet (1 mg total) by mouth 2 (two) times daily as needed for anxiety. 60 tablet 0  . busPIRone (BUSPAR) 30 MG tablet Take 1 tablet by mouth 2 (two) times daily.    . carisoprodol (SOMA) 350 MG tablet Take 1 tablet by mouth 3 (three) times daily as needed for muscle spasms.    Marland Kitchen doxycycline (VIBRA-TABS) 100 MG tablet Take 1 tablet (100 mg total) by mouth 2 (two) times daily. (Patient not taking: Reported on 02/02/2018) 20 tablet 0  . famotidine (PEPCID) 40 MG tablet Take 1 tablet (40 mg total) by mouth every evening. (Patient not taking: Reported on 02/02/2018) 30 tablet 1  . FLUoxetine (PROZAC) 40 MG capsule Take 1 capsule (40 mg total) by mouth daily. 30 capsule 0  . furosemide (LASIX) 40 MG tablet Take 1 tablet (40 mg total) by mouth daily. 30 tablet 3  . ibuprofen (ADVIL,MOTRIN) 800 MG tablet Take 1 tablet by mouth every 8 (eight) hours as needed for pain.    Marland Kitchen lansoprazole (PREVACID) 15 MG capsule Take 15 mg by mouth daily as needed.    . meloxicam (MOBIC) 15 MG tablet Take 15 mg by mouth daily.    Marland Kitchen oxyCODONE-acetaminophen (PERCOCET) 5-325 MG tablet Take 1 tablet by mouth every 6 (six) hours as needed for severe pain. (Patient not taking: Reported on 02/02/2018) 12 tablet 0  . traZODone (DESYREL) 100 MG tablet Take 2 tablets by mouth at bedtime.    . traZODone (DESYREL) 150 MG tablet Take 1 tablet (150 mg total) by mouth at bedtime as needed for sleep. (Patient not taking: Reported on  02/02/2018) 90 tablet 1   No current facility-administered medications for this visit.     OBJECTIVE: There were no vitals filed for this visit.   There is no height or weight on file to calculate BMI.    ECOG FS:0 - Asymptomatic  General: Well-developed, well-nourished, no acute distress. Eyes: Pink conjunctiva, anicteric sclera. Breast: Patient declined breast exam today. Lungs: Clear to auscultation bilaterally. Heart: Regular rate and rhythm. No rubs, murmurs, or gallops. Abdomen: Soft, nontender, nondistended. No organomegaly noted, normoactive bowel sounds. Musculoskeletal: No edema, cyanosis, or clubbing. Neuro: Alert, answering all questions appropriately. Cranial nerves grossly intact. Skin: No rashes or petechiae noted. Psych: Normal affect.  LAB RESULTS:  Lab  Results  Component Value Date   NA 136 02/02/2018   K 3.3 (L) 02/02/2018   CL 104 02/02/2018   CO2 23 02/02/2018   GLUCOSE 85 02/02/2018   BUN 16 02/02/2018   CREATININE 0.81 02/02/2018   CALCIUM 9.1 02/02/2018   PROT 7.5 02/02/2018   ALBUMIN 4.0 02/02/2018   AST 15 02/02/2018   ALT 10 02/02/2018   ALKPHOS 55 02/02/2018   BILITOT 0.4 02/02/2018   GFRNONAA >60 02/02/2018   GFRAA >60 02/02/2018    Lab Results  Component Value Date   WBC 7.6 02/02/2018   NEUTROABS 6.1 01/02/2018   HGB 11.4 (L) 02/02/2018   HCT 35.6 (L) 02/02/2018   MCV 91.0 02/02/2018   PLT 315 02/02/2018   Lab Results  Component Value Date   LABCA2 31.3 03/07/2016     STUDIES: No results found.  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  PLAN:    1.  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 Patient completed all of her chemotherapy in December 2010 and her adjuvant XRT in the Spring of 2011. Patient's most recent mammogram on June 16, 2016 was reported as BI-RADS 1.  Repeat in June 2019.  After which she likely can  be discharged from clinic.  Return to clinic in 1 year for further evaluation 2.  Anxiety: Patient will no longer receive Xanax from this clinic. 3.  Ankylosing spondylitis: Treatment for her primary rheumatologist.  Patient should not receive narcotics from this clinic.  4.  Hepatitis C: Patient reports this has resolved with treatment. 5.  Previous suicide attempts: No refills on Xanax or narcotics as above. 6.  Pain: No narcotics or treatment from this clinic as above.  Patient has been instructed to call her rheumatologist for further evaluation.  Approximately 20 minutes was spent in discussion of which greater than 50% was consultation.  Patient expressed understanding and was in agreement with this plan. She also understands that She can call clinic at any time with any questions, concerns, or complaints.    Lloyd Huger, MD   09/19/2018 11:11 PM

## 2018-09-24 ENCOUNTER — Inpatient Hospital Stay: Payer: Medicare Other | Admitting: Oncology

## 2018-10-01 ENCOUNTER — Other Ambulatory Visit: Payer: Self-pay

## 2018-10-01 ENCOUNTER — Ambulatory Visit
Admission: RE | Admit: 2018-10-01 | Discharge: 2018-10-01 | Disposition: A | Discharge status: Home / Self Care | Payer: Medicare Other | Source: Ambulatory Visit | Attending: Internal Medicine | Admitting: Internal Medicine

## 2018-10-01 DIAGNOSIS — M545 Low back pain, unspecified: Secondary | ICD-10-CM

## 2018-10-01 DIAGNOSIS — G8929 Other chronic pain: Secondary | ICD-10-CM | POA: Diagnosis present

## 2018-10-14 NOTE — Progress Notes (Signed)
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  INTERVAL HISTORY: Patient returns to clinic today for routine yearly evaluation.  She has missed several appointments in the interim.  She continues to have chronic back pain secondary to her ankylosing spondylitis.  She continues to have high anxiety and multiple social issues.  She denies any chest pain, shortness of breath, or hemoptysis. She denies any abdominal pain. She denies any nausea, vomiting, constipation, or diarrhea. She has no urinary complaints. She denies any easy bleeding or bruising.  Patient offers no further specific complaints today.  REVIEW OF SYSTEMS:   Review of Systems  Constitutional: Negative.  Negative for fever, malaise/fatigue and weight loss.  Respiratory: Negative.  Negative for cough, hemoptysis and shortness of breath.   Cardiovascular: Negative.  Negative for chest pain and leg swelling.  Gastrointestinal: Negative.  Negative for abdominal pain.  Genitourinary: Negative.  Negative for dysuria.  Musculoskeletal: Positive for back pain and joint pain.  Skin: Negative.  Negative for rash.  Neurological: Negative.  Negative for sensory change, focal weakness and weakness.  Psychiatric/Behavioral: Negative for depression. The patient is nervous/anxious.     As per HPI. Otherwise, a complete review of systems is negative.  PAST MEDICAL HISTORY: Past Medical History:  Diagnosis Date  . Ankylosing spondylitis ([place])   . Anxiety   . Arthritis   . Blood in stool   . Breast cancer ([place]) 2010   Left breast- chemo/radiation  . Chronic low back pain   . Chronic pain   . Depression   . Headache   . Hepatitis C 2007  . Hip fracture ([place])   .  History of blood transfusion   . Hypertension   . Insomnia   . OA (osteoarthritis)   . Osteoporosis   . Personal history of chemotherapy   . Personal history of radiation therapy   . Spinal stenosis of lumbar region   . Spondyloarthritis     PAST SURGICAL HISTORY: Past Surgical History:  Procedure Laterality Date  . BREAST BIOPSY Right 1983   neg  . BREAST EXCISIONAL BIOPSY Left 2010   triple neg  . BREAST LUMPECTOMY Left 2010  . FEMUR FRACTURE SURGERY    . FOOT SURGERY  2000 and 2003  . INTRAMEDULLARY (IM) NAIL INTERTROCHANTERIC Right 01/12/2015   Procedure: INTRAMEDULLARY (IM) NAIL INTERTROCHANTRIC;  Surgeon: Earnestine Leys, MD;  Location: [facility] ORS;  Service: Orthopedics;  Laterality: Right;  . KNEE SURGERY Left 2004  . TUBAL LIGATION      FAMILY HISTORY Family History  Problem Relation Age of Onset  . Arthritis Mother   . Stroke Mother   . Heart disease Mother   . Hypertension Mother   . Hyperlipidemia Mother   . Depression Mother   . Seizures Mother   . Osteoporosis Mother   . Hypertension Father   . Hypertension Brother   . Arthritis Brother   . Depression Brother   . Diabetes Brother   . Hyperlipidemia Brother   . Heart disease Maternal Aunt   . Breast cancer Maternal Aunt   . Seizures Sister   . Stroke Sister   . COPD Neg Hx   . Cancer Neg Hx        ADVANCED DIRECTIVES:  HEALTH MAINTENANCE: Social History   Tobacco Use  . Smoking status: Current Every Day Smoker    Packs/day: 0.20    Years: 25.00    Pack years: 5.00    Types: Cigarettes  . Smokeless tobacco: Never Used  Substance Use Topics  . Alcohol use: Yes    Comment: occasionally a glass of wine cooler  . Drug use: Yes    Types: Marijuana    Comment: occasionally      Colonoscopy:  PAP:  Bone density:  Lipid panel:  Allergies  Allergen Reactions  . Tramadol Nausea Only  . Penicillins Rash and Other (See Comments)    Has patient had a PCN reaction causing immediate rash,  facial/tongue/throat swelling, SOB or lightheadedness with hypotension: Yes Has patient had a PCN reaction causing severe rash involving mucus membranes or skin necrosis: No Has patient had a PCN reaction that required hospitalization No Has patient had a PCN reaction occurring within the last 10 years: No If all of the above answers are "NO", then may proceed with Cephalosporin use.     Current Outpatient Medications  Medication Sig Dispense Refill  . ALPRAZolam (XANAX) 1 MG tablet Take 1 tablet (1 mg total) by mouth 2 (two) times daily as needed for anxiety. 60 tablet 0  . busPIRone (BUSPAR) 30 MG tablet Take 1 tablet by mouth 2 (two) times daily.    . carisoprodol (SOMA) 350 MG tablet Take 1 tablet by mouth 3 (three) times daily as needed for muscle spasms.    Marland Kitchen etanercept (ENBREL) 50 MG/ML injection Inject 1 mL into the skin once a week.    Marland Kitchen FLUoxetine (PROZAC) 40 MG capsule Take 1 capsule (40 mg total) by mouth daily. 30 capsule 0  . lansoprazole (PREVACID) 15 MG capsule Take 15 mg by mouth daily as needed.    . magnesium oxide (MAG-OX) 400 MG tablet Take 1 tablet by mouth daily.    Marland Kitchen oxyCODONE-acetaminophen (PERCOCET) 5-325 MG tablet Take 1 tablet by mouth every 6 (six) hours as needed for severe pain. 12 tablet 0  . potassium chloride (KLOR-CON) 10 MEQ tablet Take 1 tablet by mouth daily.    . traZODone (DESYREL) 150 MG tablet Take 1 tablet (150 mg total) by mouth at bedtime as needed for sleep. 90 tablet 1  . furosemide (LASIX) 40 MG tablet Take 1 tablet (40 mg total) by mouth daily. (Patient not taking: Reported on 10/17/2018) 30 tablet 3  . ibuprofen (ADVIL,MOTRIN) 800 MG tablet Take 1 tablet by mouth every 8 (eight) hours as needed for pain.    . meloxicam (MOBIC) 15 MG tablet Take 15 mg by mouth daily.     No current facility-administered medications for this visit.     OBJECTIVE: Vitals:   10/18/18 1416  BP: (!) 150/104  Pulse: 90  Temp: (!) 95.7 F (35.4 C)     Body  mass index is 27.78 kg/m.    ECOG FS:0 - Asymptomatic  General: Well-developed, well-nourished, no acute distress. Eyes: Pink conjunctiva, anicteric sclera. HEENT: Normocephalic, moist mucous membranes. Breast: Patient declined breast exam today. Lungs: Clear to auscultation bilaterally. Heart: Regular rate and rhythm. No rubs, murmurs, or gallops. Abdomen: Soft, nontender, nondistended. No organomegaly noted, normoactive bowel sounds. Musculoskeletal: No edema, cyanosis, or clubbing. Neuro: Alert, answering all questions appropriately. Cranial nerves grossly intact. Skin: No rashes or petechiae noted. Psych: Normal affect.  LAB RESULTS:  Lab Results  Component Value Date   NA 136 02/02/2018   K  3.3 (L) 02/02/2018   CL 104 02/02/2018   CO2 23 02/02/2018   GLUCOSE 85 02/02/2018   BUN 16 02/02/2018   CREATININE 0.81 02/02/2018   CALCIUM 9.1 02/02/2018   PROT 7.5 02/02/2018   ALBUMIN 4.0 02/02/2018   AST 15 02/02/2018   ALT 10 02/02/2018   ALKPHOS 55 02/02/2018   BILITOT 0.4 02/02/2018   GFRNONAA >60 02/02/2018   GFRAA >60 02/02/2018    Lab Results  Component Value Date   WBC 7.6 02/02/2018   NEUTROABS 6.1 01/02/2018   HGB 11.4 (L) 02/02/2018   HCT 35.6 (L) 02/02/2018   MCV 91.0 02/02/2018   PLT 315 02/02/2018   Lab Results  Component Value Date   LABCA2 31.3 03/07/2016     STUDIES: Mr Pelvis Wo Contrast  Result Date: 10/01/2018 CLINICAL DATA:  Sacral pain and low back pain EXAM: MRI PELVIS WITHOUT CONTRAST TECHNIQUE: Multiplanar multisequence MR imaging of the pelvis was performed. No intravenous contrast was administered. COMPARISON:  Lumbar spine MRI 09/12/2016 FINDINGS: Technical note: Extensive metallic susceptibility artifact from surgical clips within the pelvis degrades evaluation of the intrapelvic structures and portions of the sacrum, particularly on T2 weighted images. Bones/Joint/Cartilage No acute fracture or malalignment. No femoral head AVN.  Susceptibility artifact from or a hardware in the proximal right femur. Minimal osteophytic spurring and low T1/T2 signal intensity sclerosis along the bilateral SI joints without edema, or erosion, or effusion. No SI joint diastasis. Pubic symphysis intact and unremarkable. No hip joint effusion. Ligaments Intact. Muscles and Tendons Preserved muscle bulk without atrophy or fatty infiltration. Tendons intact. Soft tissues No soft tissue mass, fluid collection, or hematoma. Linear scarring with foci of susceptibility within the lateral soft tissues overlying the proximal right femur from prior surgical intervention. No acute findings within the visualized pelvis. IMPRESSION: Negative for sacroiliitis. Minimal symmetric degenerative changes of the bilateral SI joints suggesting osteoarthritis. Electronically Signed   By: Davina Poke M.D.   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  PLAN:    1.  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 Patient completed all of her chemotherapy in December 2010 and her adjuvant XRT in the Spring of 2011.  Patient's most recent mammogram on August 24, 2017 was reported BI-RADS 2.  She will require screening mammogram in the next 1 to 2 weeks.  She is now 10 years removed from completing her treatment and can be discharged from clinic.  Please refer patient back if there are any questions or concerns.  2.  Anxiety: Continue follow-up and treatment per primary care.  Patient will no longer receive Xanax from this clinic. 3.  Ankylosing spondylitis: Patient reports she was recently started on Enbrel.  Continue treatment per rheumatology.  Patient should not receive narcotics from this clinic.  4.  Hepatitis C: Patient reports this has resolved with treatment. 5.  Previous suicide attempts: No refills on Xanax or narcotics as above. 6.   Pain: No narcotics or treatment from this clinic as above.   Patient expressed understanding and was in agreement with this plan. She also understands that She can call clinic at any time with any questions, concerns, or complaints.    Lloyd Huger, MD   10/21/2018 6:40 AM

## 2018-10-17 ENCOUNTER — Encounter: Payer: Self-pay | Admitting: Oncology

## 2018-10-17 ENCOUNTER — Other Ambulatory Visit: Payer: Self-pay

## 2018-10-17 DIAGNOSIS — Z720 Tobacco use: Secondary | ICD-10-CM | POA: Insufficient documentation

## 2018-10-17 DIAGNOSIS — F17200 Nicotine dependence, unspecified, uncomplicated: Secondary | ICD-10-CM | POA: Insufficient documentation

## 2018-10-17 DIAGNOSIS — F17210 Nicotine dependence, cigarettes, uncomplicated: Secondary | ICD-10-CM | POA: Insufficient documentation

## 2018-10-17 NOTE — Progress Notes (Signed)
Patient stated that she had been doing well with no complaints. 

## 2018-10-18 ENCOUNTER — Inpatient Hospital Stay: Payer: Medicare Other | Attending: Oncology | Admitting: Oncology

## 2018-10-18 ENCOUNTER — Other Ambulatory Visit: Payer: Self-pay

## 2018-10-18 VITALS — BP 150/104 | HR 90 | Temp 95.7°F | Wt 147.0 lb

## 2018-10-18 DIAGNOSIS — Z923 Personal history of irradiation: Secondary | ICD-10-CM | POA: Insufficient documentation

## 2018-10-18 DIAGNOSIS — Z823 Family history of stroke: Secondary | ICD-10-CM | POA: Diagnosis not present

## 2018-10-18 DIAGNOSIS — F329 Major depressive disorder, single episode, unspecified: Secondary | ICD-10-CM | POA: Insufficient documentation

## 2018-10-18 DIAGNOSIS — Z8249 Family history of ischemic heart disease and other diseases of the circulatory system: Secondary | ICD-10-CM | POA: Insufficient documentation

## 2018-10-18 DIAGNOSIS — Z803 Family history of malignant neoplasm of breast: Secondary | ICD-10-CM | POA: Diagnosis not present

## 2018-10-18 DIAGNOSIS — Z915 Personal history of self-harm: Secondary | ICD-10-CM | POA: Insufficient documentation

## 2018-10-18 DIAGNOSIS — Z833 Family history of diabetes mellitus: Secondary | ICD-10-CM | POA: Insufficient documentation

## 2018-10-18 DIAGNOSIS — F17210 Nicotine dependence, cigarettes, uncomplicated: Secondary | ICD-10-CM | POA: Diagnosis not present

## 2018-10-18 DIAGNOSIS — M533 Sacrococcygeal disorders, not elsewhere classified: Secondary | ICD-10-CM | POA: Insufficient documentation

## 2018-10-18 DIAGNOSIS — G8929 Other chronic pain: Secondary | ICD-10-CM | POA: Diagnosis not present

## 2018-10-18 DIAGNOSIS — F419 Anxiety disorder, unspecified: Secondary | ICD-10-CM | POA: Diagnosis not present

## 2018-10-18 DIAGNOSIS — Z9221 Personal history of antineoplastic chemotherapy: Secondary | ICD-10-CM | POA: Diagnosis not present

## 2018-10-18 DIAGNOSIS — I10 Essential (primary) hypertension: Secondary | ICD-10-CM | POA: Diagnosis not present

## 2018-10-18 DIAGNOSIS — Z888 Allergy status to other drugs, medicaments and biological substances status: Secondary | ICD-10-CM | POA: Diagnosis not present

## 2018-10-18 DIAGNOSIS — Z8261 Family history of arthritis: Secondary | ICD-10-CM | POA: Insufficient documentation

## 2018-10-18 DIAGNOSIS — Z8269 Family history of other diseases of the musculoskeletal system and connective tissue: Secondary | ICD-10-CM | POA: Insufficient documentation

## 2018-10-18 DIAGNOSIS — M1990 Unspecified osteoarthritis, unspecified site: Secondary | ICD-10-CM | POA: Diagnosis not present

## 2018-10-18 DIAGNOSIS — M459 Ankylosing spondylitis of unspecified sites in spine: Secondary | ICD-10-CM | POA: Diagnosis not present

## 2018-10-18 DIAGNOSIS — Z885 Allergy status to narcotic agent status: Secondary | ICD-10-CM | POA: Insufficient documentation

## 2018-10-18 DIAGNOSIS — M545 Low back pain: Secondary | ICD-10-CM | POA: Insufficient documentation

## 2018-10-18 DIAGNOSIS — Z79899 Other long term (current) drug therapy: Secondary | ICD-10-CM | POA: Diagnosis not present

## 2018-10-18 DIAGNOSIS — Z171 Estrogen receptor negative status [ER-]: Secondary | ICD-10-CM | POA: Diagnosis not present

## 2018-10-18 DIAGNOSIS — C50912 Malignant neoplasm of unspecified site of left female breast: Secondary | ICD-10-CM | POA: Insufficient documentation

## 2018-10-18 DIAGNOSIS — C50919 Malignant neoplasm of unspecified site of unspecified female breast: Secondary | ICD-10-CM | POA: Diagnosis not present

## 2018-10-18 DIAGNOSIS — Z820 Family history of epilepsy and other diseases of the nervous system: Secondary | ICD-10-CM | POA: Insufficient documentation

## 2018-10-18 DIAGNOSIS — Z880 Allergy status to penicillin: Secondary | ICD-10-CM | POA: Insufficient documentation

## 2018-10-18 DIAGNOSIS — Z83438 Family history of other disorder of lipoprotein metabolism and other lipidemia: Secondary | ICD-10-CM | POA: Diagnosis not present

## 2018-10-21 ENCOUNTER — Other Ambulatory Visit: Payer: Self-pay | Admitting: Internal Medicine

## 2018-10-21 DIAGNOSIS — Z1231 Encounter for screening mammogram for malignant neoplasm of breast: Secondary | ICD-10-CM

## 2018-10-23 ENCOUNTER — Ambulatory Visit
Admission: RE | Admit: 2018-10-23 | Discharge: 2018-10-23 | Disposition: A | Discharge status: Home / Self Care | Payer: Medicare Other | Source: Ambulatory Visit | Attending: Internal Medicine | Admitting: Internal Medicine

## 2018-10-23 ENCOUNTER — Other Ambulatory Visit: Payer: Self-pay | Admitting: Internal Medicine

## 2018-10-23 ENCOUNTER — Other Ambulatory Visit: Payer: Self-pay

## 2018-10-23 DIAGNOSIS — R109 Unspecified abdominal pain: Secondary | ICD-10-CM

## 2018-11-26 ENCOUNTER — Encounter

## 2018-11-26 ENCOUNTER — Ambulatory Visit: Payer: Self-pay | Admitting: Urology

## 2019-04-07 ENCOUNTER — Ambulatory Visit
Admission: RE | Admit: 2019-04-07 | Discharge: 2019-04-07 | Disposition: A | Discharge status: Home / Self Care | Payer: Medicare Other | Source: Ambulatory Visit | Attending: Internal Medicine | Admitting: Internal Medicine

## 2019-04-07 DIAGNOSIS — Z1231 Encounter for screening mammogram for malignant neoplasm of breast: Secondary | ICD-10-CM | POA: Diagnosis not present

## 2019-05-13 ENCOUNTER — Telehealth (INDEPENDENT_AMBULATORY_CARE_PROVIDER_SITE_OTHER): Payer: Self-pay | Admitting: Gastroenterology

## 2019-05-13 DIAGNOSIS — Z1211 Encounter for screening for malignant neoplasm of colon: Secondary | ICD-10-CM

## 2019-05-13 NOTE — Progress Notes (Signed)
Patient will call me back to schedule her colonoscopy.  She's having some dental procedures done.  Thanks,  [place], [place]

## 2019-06-19 ENCOUNTER — Other Ambulatory Visit: Payer: Self-pay

## 2019-06-19 DIAGNOSIS — Z1211 Encounter for screening for malignant neoplasm of colon: Secondary | ICD-10-CM

## 2019-06-20 ENCOUNTER — Telehealth: Payer: Self-pay

## 2019-06-20 DIAGNOSIS — Z1211 Encounter for screening for malignant neoplasm of colon: Secondary | ICD-10-CM

## 2019-06-20 NOTE — Telephone Encounter (Signed)
Colonoscopy has been rescheduled to Thursday 08/07/19 due to transportation.  Pt has been advised of COVID test 08/05/19.  New instructions have been sent.  [place] has been notified of date change.  Referral updated.  Thanks,  [place], [place]

## 2019-06-26 ENCOUNTER — Other Ambulatory Visit: Payer: Self-pay | Admitting: Internal Medicine

## 2019-06-26 DIAGNOSIS — R319 Hematuria, unspecified: Secondary | ICD-10-CM

## 2019-06-26 DIAGNOSIS — G8929 Other chronic pain: Secondary | ICD-10-CM

## 2019-06-27 ENCOUNTER — Emergency Department: Payer: Medicare Other

## 2019-06-27 ENCOUNTER — Emergency Department
Admission: EM | Admit: 2019-06-27 | Discharge: 2019-06-27 | Disposition: A | Discharge status: Home / Self Care | Payer: Medicare Other | Attending: Emergency Medicine | Admitting: Emergency Medicine

## 2019-06-27 ENCOUNTER — Other Ambulatory Visit: Payer: Self-pay

## 2019-06-27 DIAGNOSIS — I10 Essential (primary) hypertension: Secondary | ICD-10-CM | POA: Diagnosis not present

## 2019-06-27 DIAGNOSIS — R109 Unspecified abdominal pain: Secondary | ICD-10-CM | POA: Insufficient documentation

## 2019-06-27 DIAGNOSIS — R319 Hematuria, unspecified: Secondary | ICD-10-CM | POA: Diagnosis present

## 2019-06-27 DIAGNOSIS — N390 Urinary tract infection, site not specified: Secondary | ICD-10-CM | POA: Diagnosis not present

## 2019-06-27 DIAGNOSIS — F17210 Nicotine dependence, cigarettes, uncomplicated: Secondary | ICD-10-CM | POA: Insufficient documentation

## 2019-06-27 LAB — COMPREHENSIVE METABOLIC PANEL
ALT: 14 U/L (ref 0–44)
AST: 20 U/L (ref 15–41)
Albumin: 4.2 g/dL (ref 3.5–5.0)
Alkaline Phosphatase: 93 U/L (ref 38–126)
Anion gap: 13 (ref 5–15)
BUN: 15 mg/dL (ref 8–23)
CO2: 19 mmol/L — ABNORMAL LOW (ref 22–32)
Calcium: 9.2 mg/dL (ref 8.9–10.3)
Chloride: 107 mmol/L (ref 98–111)
Creatinine, Ser: 0.75 mg/dL (ref 0.44–1.00)
GFR calc Af Amer: >60 mL/min (ref >60)
GFR calc non Af Amer: >60 mL/min (ref >60)
Glucose, Bld: 116 mg/dL — ABNORMAL HIGH (ref 70–99)
Potassium: 3.5 mmol/L (ref 3.5–5.1)
Sodium: 139 mmol/L (ref 135–145)
Total Bilirubin: 0.7 mg/dL (ref 0.3–1.2)
Total Protein: 7.6 g/dL (ref 6.5–8.1)

## 2019-06-27 LAB — URINALYSIS, COMPLETE (UACMP) WITH MICROSCOPIC
Bacteria, UA: NONE SEEN
Bilirubin Urine: NEGATIVE
Glucose, UA: NEGATIVE mg/dL
Ketones, ur: NEGATIVE mg/dL
Leukocytes,Ua: NEGATIVE
Nitrite: NEGATIVE
Protein, ur: NEGATIVE mg/dL
Specific Gravity, Urine: 1.017 (ref 1.005–1.030)
pH: 6 (ref 5.0–8.0)

## 2019-06-27 LAB — CBC
HCT: 38.3 % (ref 36.0–46.0)
Hemoglobin: 13.3 g/dL (ref 12.0–15.0)
MCH: 31.6 pg (ref 26.0–34.0)
MCHC: 34.7 g/dL (ref 30.0–36.0)
MCV: 91 fL (ref 80.0–100.0)
Platelets: 230 10*3/uL (ref 150–400)
RBC: 4.21 MIL/uL (ref 3.87–5.11)
RDW: 13.7 % (ref 11.5–15.5)
WBC: 8.1 10*3/uL (ref 4.0–10.5)
nRBC: 0 % (ref 0.0–0.2)

## 2019-06-27 LAB — LIPASE, BLOOD: Lipase: 39 U/L (ref 11–51)

## 2019-06-27 MED ORDER — FENTANYL CITRATE (PF) 100 MCG/2ML IJ SOLN
50.0000 ug | Freq: Once | INTRAMUSCULAR | Status: AC
  Administered 2019-06-27: 50 ug via INTRAVENOUS
  Filled 2019-06-27: qty 2

## 2019-06-27 MED ORDER — HYDROCODONE-ACETAMINOPHEN 5-325 MG PO TABS: 1.0000 | ORAL_TABLET | ORAL | 0 refills | Status: DC | PRN

## 2019-06-27 MED ORDER — KETOROLAC TROMETHAMINE 30 MG/ML IJ SOLN
15.0000 mg | Freq: Once | INTRAMUSCULAR | Status: AC
  Administered 2019-06-27: 15 mg via INTRAVENOUS
  Filled 2019-06-27: qty 1

## 2019-06-27 MED ORDER — LEVOFLOXACIN IN D5W 500 MG/100ML IV SOLN
500.0000 mg | Freq: Once | INTRAVENOUS | Status: AC
  Administered 2019-06-27: 500 mg via INTRAVENOUS
  Filled 2019-06-27: qty 100

## 2019-06-27 MED ORDER — ONDANSETRON HCL 4 MG/2ML IJ SOLN
4.0000 mg | Freq: Once | INTRAMUSCULAR | Status: AC
  Administered 2019-06-27: 4 mg via INTRAVENOUS
  Filled 2019-06-27: qty 2

## 2019-06-27 MED ORDER — LEVOFLOXACIN 500 MG PO TABS
500.0000 mg | ORAL_TABLET | Freq: Every day | ORAL | 0 refills | Status: AC
Start: 2019-06-27 — End: 2019-07-04

## 2019-06-27 MED ORDER — SODIUM CHLORIDE 0.9 % IV BOLUS
1000.0000 mL | Freq: Once | INTRAVENOUS | Status: AC
  Administered 2019-06-27: 1000 mL via INTRAVENOUS

## 2019-06-27 NOTE — ED Triage Notes (Signed)
Pt here from home with abd pain and hematuria x1 week. Pt NAD on arrival.

## 2019-06-27 NOTE — ED Provider Notes (Signed)
[facility] Emergency Department Provider Note  Time seen: 11:46 AM  I have reviewed the triage vital signs and the nursing notes.   HISTORY  Chief Complaint Hematuria and Abdominal Pain   HPI Janet Zuniga is a 64 y.o. female with a past medical history of anxiety, hypertension, presents to the emergency department for hematuria and abdominal discomfort.  According to the patient approximately 6 days ago she developed hematuria, 5 days ago she called her primary care provider who prescribed her an antibiotic for possible UTI.  Patient states she has been taking the antibiotic but has not been improving.  States she is having some abdominal discomfort mostly in the lower abdomen and on the right side.  Called her provider back today and they wish to refer her for a CT scan but cannot do so until next week.  Patient states she was continuing to have what she described as moderate sharp pain so she came to the emergency department instead.  Denies any fever.  No dysuria.  No black stool.   Past Medical History:  Diagnosis Date  . Ankylosing spondylitis ([place])   . Anxiety   . Arthritis   . Blood in stool   . Breast cancer ([place]) 2010   Left breast- chemo/radiation  . Chronic low back pain   . Chronic pain   . Depression   . Headache   . Hepatitis C 2007  . Hip fracture ([place])   . History of blood transfusion   . Hypertension   . Insomnia   . OA (osteoarthritis)   . Osteoporosis   . Personal history of chemotherapy   . Personal history of radiation therapy   . Spinal stenosis of lumbar region   . Spondyloarthritis     Patient Active Problem List   Diagnosis Date Noted  . Tobacco use disorder 10/17/2018  . 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 Depression, major, recurrent, moderate ([place]) 02/03/2018  . 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 Degenerative  disc disease, lumbar 09/20/2016  . Shoulder joint pain 09/18/2016  . Hypertension   . Ankylosing spondylitis ([place])   . OA (osteoarthritis)   . Osteoporosis   . Malignant neoplasm of left female breast ([place]) 09/12/2015  . Cocaine abuse ([place]) 08/18/2015  . Alcohol abuse 08/03/2015  . Chronic use of opiate drugs therapeutic purposes 05/19/2015  . Leg reflex sympathetic dystrophy, right 05/19/2015  . Fracture of shaft of femur ([place]) 01/12/2015  . Anxiety 01/12/2015  . Anxiety and depression 12/24/2014  . Chronic bilateral low back pain with bilateral sciatica 12/24/2014  . Substance induced mood disorder ([place]) 10/27/2014  . Chronic pain in right foot 10/20/2014  . Acute bronchitis 10/20/2014  . Insomnia 06/05/2014  . Chronic pain syndrome 05/15/2014  . Raynaud's phenomenon without gangrene 05/15/2014  . Spinal stenosis of lumbar region 05/15/2014  . 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 Generalized anxiety disorder 05/04/2014  . Hepatitis C 03/12/2014  . Midline low back pain without sciatica 03/12/2014  . History of breast cancer in female 12/23/2013  . Chronic pain 12/23/2013  . Ankylosing spondylitis of lumbosacral region ([place]) 12/23/2013  . Depression 12/23/2013  . Osteoarthritis 10/05/2011    Past Surgical History:  Procedure Laterality Date  . BREAST BIOPSY Right 1983   neg  . BREAST EXCISIONAL BIOPSY Left 2010   triple neg  . BREAST LUMPECTOMY Left 2010  . FEMUR FRACTURE SURGERY    .  FOOT SURGERY  2000 and 2003  . INTRAMEDULLARY (IM) NAIL INTERTROCHANTERIC Right 01/12/2015   Procedure: INTRAMEDULLARY (IM) NAIL INTERTROCHANTRIC;  Surgeon: Earnestine Leys, MD;  Location: [facility] ORS;  Service: Orthopedics;  Laterality: Right;  . KNEE SURGERY Left 2004  . TUBAL LIGATION      Prior to Admission medications   Medication Sig Start Date End Date Taking? Authorizing Provider  ALPRAZolam Duanne Moron) 1 MG tablet Take 1 tablet (1 mg total) by mouth 2 (two) times daily as needed for anxiety.  04/18/17   Volney American, PA-C  busPIRone (BUSPAR) 30 MG tablet Take 1 tablet by mouth 2 (two) times daily. 07/18/17   [provider]  carisoprodol (SOMA) 350 MG tablet Take 1 tablet by mouth 3 (three) times daily as needed for muscle spasms. 01/25/18   [provider]  clindamycin (CLEOCIN) 300 MG capsule Take 300 mg by mouth every 6 (six) hours. 04/15/19   [provider]  etanercept (ENBREL) 50 MG/ML injection Inject 1 mL into the skin once a week. 07/10/18   [provider]  FLUoxetine (PROZAC) 40 MG capsule Take 1 capsule (40 mg total) by mouth daily. 04/25/17   Volney American, PA-C  furosemide (LASIX) 40 MG tablet Take 1 tablet (40 mg total) by mouth daily. 04/27/17   Volney American, PA-C  HYDROcodone-acetaminophen [facility]) 7.5-325 MG tablet  04/15/19   [provider]  ibuprofen (ADVIL,MOTRIN) 800 MG tablet Take 1 tablet by mouth every 8 (eight) hours as needed for pain. 01/16/18   [provider]  lansoprazole (PREVACID) 15 MG capsule Take 15 mg by mouth daily as needed.    [provider]  magnesium oxide (MAG-OX) 400 MG tablet Take 1 tablet by mouth daily. 08/16/18   [provider]  meloxicam (MOBIC) 15 MG tablet Take 15 mg by mouth daily.    [provider]  oxyCODONE-acetaminophen (PERCOCET) 5-325 MG tablet Take 1 tablet by mouth every 6 (six) hours as needed for severe pain. 01/02/18   Sable Feil, PA-C  traZODone (DESYREL) 150 MG tablet Take 1 tablet (150 mg total) by mouth at bedtime as needed for sleep. 04/25/17   Volney American, PA-C    Allergies  Allergen Reactions  . Tramadol Nausea Only  . Penicillins Rash and Other (See Comments)    Has patient had a PCN reaction causing immediate rash, facial/tongue/throat swelling, SOB or lightheadedness with hypotension: Yes Has patient had a PCN reaction causing severe rash involving mucus membranes or skin necrosis: No Has patient  had a PCN reaction that required hospitalization No Has patient had a PCN reaction occurring within the last 10 years: No If all of the above answers are "NO", then may proceed with Cephalosporin use.     Family History  Problem Relation Age of Onset  . Arthritis Mother   . Stroke Mother   . Heart disease Mother   . Hypertension Mother   . Hyperlipidemia Mother   . Depression Mother   . Seizures Mother   . Osteoporosis Mother   . Hypertension Father   . Hypertension Brother   . Arthritis Brother   . Depression Brother   . Diabetes Brother   . Hyperlipidemia Brother   . Heart disease Maternal Aunt   . Breast cancer Maternal Aunt   . Seizures Sister   . Stroke Sister   . COPD Neg Hx   . Cancer Neg Hx     Social History Social History  Tobacco Use  . Smoking status: Current Every Day Smoker    Packs/day: 0.20    Years: 25.00    Pack years: 5.00    Types: Cigarettes  . Smokeless tobacco: Never Used  Vaping Use  . Vaping Use: Never used  Substance Use Topics  . Alcohol use: Yes    Comment: occasionally a glass of wine cooler  . Drug use: Yes    Types: Marijuana    Comment: occasionally     Review of Systems Constitutional: Negative for fever. Cardiovascular: Negative for chest pain. Respiratory: Negative for shortness of breath. Gastrointestinal: Fairly vague abdominal discomfort right-sided. Genitourinary: Intermittent hematuria over the past 1 week. Skin: Negative for skin complaints  Neurological: Negative for headache All other ROS negative  ____________________________________________   PHYSICAL EXAM:  VITAL SIGNS: ED Triage Vitals  Enc Vitals Group     BP 06/27/19 1107 (!) 151/91     Pulse Rate 06/27/19 1107 67     Resp 06/27/19 1107 18     Temp 06/27/19 1107 98.3 F (36.8 C)     Temp Source 06/27/19 1107 Oral     SpO2 06/27/19 1107 100 %     Weight 06/27/19 1104 145 lb (65.8 kg)     Height 06/27/19 1104 5\' 1"  (1.549 m)     Head  Circumference --      Peak Flow --      Pain Score 06/27/19 1104 8     Pain Loc --      Pain Edu? --      Excl. in [place]? --     Constitutional: Alert and oriented. Well appearing and in no distress. Eyes: Normal exam ENT      Head: Normocephalic and atraumatic.      Mouth/Throat: Mucous membranes are moist. Cardiovascular: Normal rate, regular rhythm.  Respiratory: Normal respiratory effort without tachypnea nor retractions. Breath sounds are clear  Gastrointestinal: Soft, slight diffuse abdominal tenderness palpation without focal area of tenderness identified, moderate right CVA tenderness with no left CVA tenderness. Musculoskeletal: Nontender with normal range of motion in all extremities. Neurologic:  Normal speech and language. No gross focal neurologic deficits  Skin:  Skin is warm, dry and intact.  Psychiatric: Mood and affect are normal.   ____________________________________________    EKG  EKG viewed and interpreted by myself shows a normal sinus rhythm at 69 bpm with a narrow QRS, normal axis, normal intervals, nonspecific ST changes.  ____________________________________________    RADIOLOGY   IMPRESSION:  1. Mild perinephric stranding slightly worse on the RIGHT than the  LEFT. Correlate with any clinical or laboratory evidence of urinary  tract infection, could be seen in the setting of pyelonephritis or  worsening renal function.  2. Renal cortical scarring worse on the LEFT similar to the prior  study.  3. Minimal colonic diverticulosis without evidence of acute  gastrointestinal process.  4. Normal appendix.  5. Aortic atherosclerosis.   ____________________________________________   INITIAL IMPRESSION / ASSESSMENT AND PLAN / ED COURSE  Pertinent labs & imaging results that were available during my care of the patient were reviewed by me and considered in my medical decision making (see chart for details).   Patient presents to the emergency  department for abdominal pain and hematuria.  Symptoms have been ongoing for the past 1 week and have been intermittent/sporadic.  Patient was initially prescribed an antibiotic for urinary tract infection but was later told by her doctor that the culture was negative and  it was not an infection.  We will obtain a CT renal scan to evaluate for possible ureterolithiasis.  We will send labs including a urinalysis with a urine culture.  We will treat with Toradol IV fluids and continue to closely monitor while awaiting results.  Patient agreeable to plan.  Patient's work-up shows mild perinephric stranding in the right greater than left.  No kidney stone.  Renal function appears normal.  Does have a moderate mount hemoglobin in her urine.  We will send a urine culture.  We will discharge with antibiotics.  We will have the patient follow-up with her doctor.  Patient agreeable to plan of care.  KOREY ARROYO was evaluated in Emergency Department on 06/27/2019 for the symptoms described in the history of present illness. She was evaluated in the context of the global COVID-19 pandemic, which necessitated consideration that the patient might be at risk for infection with the SARS-CoV-2 virus that causes COVID-19. Institutional protocols and algorithms that pertain to the evaluation of patients at risk for COVID-19 are in a state of rapid change based on information released by regulatory bodies including the CDC and federal and state organizations. These policies and algorithms were followed during the patient's care in the ED.  ____________________________________________   FINAL CLINICAL IMPRESSION(S) / ED DIAGNOSES  Hematuria Abdominal pain Urinary tract infection   Harvest Dark, MD 06/27/19 1506

## 2019-06-27 NOTE — ED Notes (Signed)
Pt taken to CT.

## 2019-06-28 LAB — URINE CULTURE: Culture: NO GROWTH

## 2019-07-08 ENCOUNTER — Telehealth: Payer: Self-pay

## 2019-07-08 NOTE — Telephone Encounter (Signed)
Returned patients call.  LVM for her to call me back in regards to her colonoscopy questions.  I also informed her that she can view instructions in her mychart under the letters tab, but to call me back to address her questions.  Thanks,  [place], [place]

## 2019-07-15 ENCOUNTER — Telehealth: Payer: Self-pay

## 2019-07-15 NOTE — Telephone Encounter (Signed)
Returned patients call to reschedule her colonoscopy.  LVM for her to call me back-asked her to please call me leaving a message with a specific date in mind.  And I will call her back to let her know if the specific date is available.  Thanks,  [place], [place]

## 2019-07-22 ENCOUNTER — Other Ambulatory Visit: Payer: Self-pay

## 2019-07-22 DIAGNOSIS — Z1211 Encounter for screening for malignant neoplasm of colon: Secondary | ICD-10-CM

## 2019-08-25 ENCOUNTER — Encounter: Payer: Self-pay | Admitting: Emergency Medicine

## 2019-08-25 ENCOUNTER — Other Ambulatory Visit: Payer: Self-pay

## 2019-08-25 DIAGNOSIS — F1994 Other psychoactive substance use, unspecified with psychoactive substance-induced mood disorder: Secondary | ICD-10-CM | POA: Diagnosis present

## 2019-08-25 DIAGNOSIS — A419 Sepsis, unspecified organism: Principal | ICD-10-CM | POA: Diagnosis present

## 2019-08-25 DIAGNOSIS — Z8249 Family history of ischemic heart disease and other diseases of the circulatory system: Secondary | ICD-10-CM

## 2019-08-25 DIAGNOSIS — J189 Pneumonia, unspecified organism: Secondary | ICD-10-CM | POA: Diagnosis present

## 2019-08-25 DIAGNOSIS — F419 Anxiety disorder, unspecified: Secondary | ICD-10-CM | POA: Diagnosis present

## 2019-08-25 DIAGNOSIS — F1910 Other psychoactive substance abuse, uncomplicated: Secondary | ICD-10-CM | POA: Diagnosis present

## 2019-08-25 DIAGNOSIS — M810 Age-related osteoporosis without current pathological fracture: Secondary | ICD-10-CM | POA: Diagnosis present

## 2019-08-25 DIAGNOSIS — N10 Acute tubulo-interstitial nephritis: Secondary | ICD-10-CM | POA: Diagnosis present

## 2019-08-25 DIAGNOSIS — Z853 Personal history of malignant neoplasm of breast: Secondary | ICD-10-CM

## 2019-08-25 DIAGNOSIS — M459 Ankylosing spondylitis of unspecified sites in spine: Secondary | ICD-10-CM | POA: Diagnosis present

## 2019-08-25 DIAGNOSIS — E876 Hypokalemia: Secondary | ICD-10-CM | POA: Diagnosis present

## 2019-08-25 DIAGNOSIS — Z20822 Contact with and (suspected) exposure to covid-19: Secondary | ICD-10-CM | POA: Diagnosis present

## 2019-08-25 DIAGNOSIS — Z885 Allergy status to narcotic agent status: Secondary | ICD-10-CM

## 2019-08-25 DIAGNOSIS — I700 Atherosclerosis of aorta: Secondary | ICD-10-CM | POA: Diagnosis present

## 2019-08-25 DIAGNOSIS — F17210 Nicotine dependence, cigarettes, uncomplicated: Secondary | ICD-10-CM | POA: Diagnosis present

## 2019-08-25 DIAGNOSIS — R531 Weakness: Secondary | ICD-10-CM | POA: Diagnosis not present

## 2019-08-25 DIAGNOSIS — N179 Acute kidney failure, unspecified: Secondary | ICD-10-CM | POA: Diagnosis present

## 2019-08-25 DIAGNOSIS — Z8262 Family history of osteoporosis: Secondary | ICD-10-CM

## 2019-08-25 DIAGNOSIS — E871 Hypo-osmolality and hyponatremia: Secondary | ICD-10-CM | POA: Diagnosis present

## 2019-08-25 DIAGNOSIS — F418 Other specified anxiety disorders: Secondary | ICD-10-CM | POA: Diagnosis present

## 2019-08-25 DIAGNOSIS — E861 Hypovolemia: Secondary | ICD-10-CM | POA: Diagnosis present

## 2019-08-25 DIAGNOSIS — Z791 Long term (current) use of non-steroidal anti-inflammatories (NSAID): Secondary | ICD-10-CM

## 2019-08-25 DIAGNOSIS — Z823 Family history of stroke: Secondary | ICD-10-CM

## 2019-08-25 DIAGNOSIS — Z83438 Family history of other disorder of lipoprotein metabolism and other lipidemia: Secondary | ICD-10-CM

## 2019-08-25 DIAGNOSIS — Z818 Family history of other mental and behavioral disorders: Secondary | ICD-10-CM

## 2019-08-25 DIAGNOSIS — Z880 Allergy status to penicillin: Secondary | ICD-10-CM

## 2019-08-25 DIAGNOSIS — Z833 Family history of diabetes mellitus: Secondary | ICD-10-CM

## 2019-08-25 DIAGNOSIS — G8929 Other chronic pain: Secondary | ICD-10-CM | POA: Diagnosis present

## 2019-08-25 DIAGNOSIS — I10 Essential (primary) hypertension: Secondary | ICD-10-CM | POA: Diagnosis present

## 2019-08-25 DIAGNOSIS — Z9221 Personal history of antineoplastic chemotherapy: Secondary | ICD-10-CM

## 2019-08-25 DIAGNOSIS — F329 Major depressive disorder, single episode, unspecified: Secondary | ICD-10-CM | POA: Diagnosis present

## 2019-08-25 DIAGNOSIS — M4856XA Collapsed vertebra, not elsewhere classified, lumbar region, initial encounter for fracture: Secondary | ICD-10-CM | POA: Diagnosis present

## 2019-08-25 DIAGNOSIS — B1920 Unspecified viral hepatitis C without hepatic coma: Secondary | ICD-10-CM | POA: Diagnosis present

## 2019-08-25 DIAGNOSIS — Z79899 Other long term (current) drug therapy: Secondary | ICD-10-CM

## 2019-08-25 DIAGNOSIS — Z8261 Family history of arthritis: Secondary | ICD-10-CM

## 2019-08-25 DIAGNOSIS — Z803 Family history of malignant neoplasm of breast: Secondary | ICD-10-CM

## 2019-08-25 DIAGNOSIS — Z923 Personal history of irradiation: Secondary | ICD-10-CM

## 2019-08-25 LAB — COMPREHENSIVE METABOLIC PANEL
ALT: 16 U/L (ref 0–44)
AST: 24 U/L (ref 15–41)
Albumin: 3.6 g/dL (ref 3.5–5.0)
Alkaline Phosphatase: 80 U/L (ref 38–126)
Anion gap: 15 (ref 5–15)
BUN: 20 mg/dL (ref 8–23)
CO2: 20 mmol/L — ABNORMAL LOW (ref 22–32)
Calcium: 8.9 mg/dL (ref 8.9–10.3)
Chloride: 96 mmol/L — ABNORMAL LOW (ref 98–111)
Creatinine, Ser: 1.48 mg/dL — ABNORMAL HIGH (ref 0.44–1.00)
GFR calc Af Amer: 44 mL/min — ABNORMAL LOW (ref >60)
GFR calc non Af Amer: 38 mL/min — ABNORMAL LOW (ref >60)
Glucose, Bld: 163 mg/dL — ABNORMAL HIGH (ref 70–99)
Potassium: 2.8 mmol/L — ABNORMAL LOW (ref 3.5–5.1)
Sodium: 131 mmol/L — ABNORMAL LOW (ref 135–145)
Total Bilirubin: 1 mg/dL (ref 0.3–1.2)
Total Protein: 7.7 g/dL (ref 6.5–8.1)

## 2019-08-25 LAB — CBC
HCT: 32.7 % — ABNORMAL LOW (ref 36.0–46.0)
Hemoglobin: 11.6 g/dL — ABNORMAL LOW (ref 12.0–15.0)
MCH: 31.2 pg (ref 26.0–34.0)
MCHC: 35.5 g/dL (ref 30.0–36.0)
MCV: 87.9 fL (ref 80.0–100.0)
Platelets: 190 10*3/uL (ref 150–400)
RBC: 3.72 MIL/uL — ABNORMAL LOW (ref 3.87–5.11)
RDW: 13.8 % (ref 11.5–15.5)
WBC: 18.6 10*3/uL — ABNORMAL HIGH (ref 4.0–10.5)
nRBC: 0 % (ref 0.0–0.2)

## 2019-08-25 LAB — LIPASE, BLOOD: Lipase: 21 U/L (ref 11–51)

## 2019-08-25 MED ORDER — ACETAMINOPHEN 325 MG PO TABS
650.0000 mg | ORAL_TABLET | Freq: Once | ORAL | Status: AC | PRN
  Administered 2019-08-25: 650 mg via ORAL
  Filled 2019-08-25: qty 2

## 2019-08-25 NOTE — ED Triage Notes (Signed)
Pt presents to ED via [facility] with c/o mid/upper abdominal pain, N/V and weakness. Pt states has not had a hot meal since Saturday. Pt presents A&O at this time.

## 2019-08-26 ENCOUNTER — Encounter: Payer: Self-pay | Admitting: Radiology

## 2019-08-26 ENCOUNTER — Emergency Department: Payer: Medicare Other

## 2019-08-26 ENCOUNTER — Inpatient Hospital Stay
Admission: EM | Admit: 2019-08-26 | Discharge: 2019-08-29 | DRG: 871 | Disposition: A | Discharge status: Home / Self Care | Payer: Medicare Other | Attending: Internal Medicine | Admitting: Internal Medicine

## 2019-08-26 DIAGNOSIS — B1920 Unspecified viral hepatitis C without hepatic coma: Secondary | ICD-10-CM

## 2019-08-26 DIAGNOSIS — G8929 Other chronic pain: Secondary | ICD-10-CM | POA: Diagnosis present

## 2019-08-26 DIAGNOSIS — F39 Unspecified mood [affective] disorder: Secondary | ICD-10-CM | POA: Diagnosis present

## 2019-08-26 DIAGNOSIS — F329 Major depressive disorder, single episode, unspecified: Secondary | ICD-10-CM | POA: Diagnosis not present

## 2019-08-26 DIAGNOSIS — F1910 Other psychoactive substance abuse, uncomplicated: Secondary | ICD-10-CM | POA: Diagnosis present

## 2019-08-26 DIAGNOSIS — R062 Wheezing: Secondary | ICD-10-CM

## 2019-08-26 DIAGNOSIS — Z720 Tobacco use: Secondary | ICD-10-CM | POA: Diagnosis present

## 2019-08-26 DIAGNOSIS — Z9221 Personal history of antineoplastic chemotherapy: Secondary | ICD-10-CM | POA: Diagnosis not present

## 2019-08-26 DIAGNOSIS — F17200 Nicotine dependence, unspecified, uncomplicated: Secondary | ICD-10-CM | POA: Diagnosis present

## 2019-08-26 DIAGNOSIS — J189 Pneumonia, unspecified organism: Secondary | ICD-10-CM

## 2019-08-26 DIAGNOSIS — N10 Acute tubulo-interstitial nephritis: Secondary | ICD-10-CM

## 2019-08-26 DIAGNOSIS — R059 Cough, unspecified: Secondary | ICD-10-CM

## 2019-08-26 DIAGNOSIS — A419 Sepsis, unspecified organism: Secondary | ICD-10-CM | POA: Diagnosis present

## 2019-08-26 DIAGNOSIS — M459 Ankylosing spondylitis of unspecified sites in spine: Secondary | ICD-10-CM | POA: Diagnosis present

## 2019-08-26 DIAGNOSIS — Z923 Personal history of irradiation: Secondary | ICD-10-CM | POA: Diagnosis not present

## 2019-08-26 DIAGNOSIS — Z853 Personal history of malignant neoplasm of breast: Secondary | ICD-10-CM

## 2019-08-26 DIAGNOSIS — Z885 Allergy status to narcotic agent status: Secondary | ICD-10-CM | POA: Diagnosis not present

## 2019-08-26 DIAGNOSIS — N390 Urinary tract infection, site not specified: Secondary | ICD-10-CM | POA: Diagnosis present

## 2019-08-26 DIAGNOSIS — Z20822 Contact with and (suspected) exposure to covid-19: Secondary | ICD-10-CM | POA: Diagnosis present

## 2019-08-26 DIAGNOSIS — F1914 Other psychoactive substance abuse with psychoactive substance-induced mood disorder: Secondary | ICD-10-CM

## 2019-08-26 DIAGNOSIS — M4856XA Collapsed vertebra, not elsewhere classified, lumbar region, initial encounter for fracture: Secondary | ICD-10-CM | POA: Diagnosis present

## 2019-08-26 DIAGNOSIS — E871 Hypo-osmolality and hyponatremia: Secondary | ICD-10-CM | POA: Diagnosis not present

## 2019-08-26 DIAGNOSIS — M352 Behcet's disease: Secondary | ICD-10-CM

## 2019-08-26 DIAGNOSIS — I700 Atherosclerosis of aorta: Secondary | ICD-10-CM | POA: Diagnosis present

## 2019-08-26 DIAGNOSIS — M810 Age-related osteoporosis without current pathological fracture: Secondary | ICD-10-CM | POA: Diagnosis present

## 2019-08-26 DIAGNOSIS — J155 Pneumonia due to Escherichia coli: Secondary | ICD-10-CM | POA: Diagnosis not present

## 2019-08-26 DIAGNOSIS — E876 Hypokalemia: Secondary | ICD-10-CM | POA: Diagnosis present

## 2019-08-26 DIAGNOSIS — I10 Essential (primary) hypertension: Secondary | ICD-10-CM | POA: Diagnosis not present

## 2019-08-26 DIAGNOSIS — N179 Acute kidney failure, unspecified: Secondary | ICD-10-CM | POA: Diagnosis not present

## 2019-08-26 DIAGNOSIS — F1994 Other psychoactive substance use, unspecified with psychoactive substance-induced mood disorder: Secondary | ICD-10-CM | POA: Diagnosis present

## 2019-08-26 DIAGNOSIS — F418 Other specified anxiety disorders: Secondary | ICD-10-CM | POA: Diagnosis present

## 2019-08-26 DIAGNOSIS — F32A Depression, unspecified: Secondary | ICD-10-CM | POA: Diagnosis present

## 2019-08-26 DIAGNOSIS — F419 Anxiety disorder, unspecified: Secondary | ICD-10-CM | POA: Diagnosis not present

## 2019-08-26 DIAGNOSIS — F17210 Nicotine dependence, cigarettes, uncomplicated: Secondary | ICD-10-CM | POA: Diagnosis present

## 2019-08-26 DIAGNOSIS — R531 Weakness: Secondary | ICD-10-CM | POA: Diagnosis present

## 2019-08-26 LAB — URINALYSIS, COMPLETE (UACMP) WITH MICROSCOPIC
Bacteria, UA: NONE SEEN
Bilirubin Urine: NEGATIVE
Glucose, UA: NEGATIVE mg/dL
Ketones, ur: NEGATIVE mg/dL
Nitrite: NEGATIVE
Protein, ur: 100 mg/dL — AB
Specific Gravity, Urine: 1.014 (ref 1.005–1.030)
WBC, UA: >50 WBC/hpf — ABNORMAL HIGH (ref 0–5)
pH: 7 (ref 5.0–8.0)

## 2019-08-26 LAB — BASIC METABOLIC PANEL
Anion gap: 12 (ref 5–15)
BUN: 23 mg/dL (ref 8–23)
CO2: 23 mmol/L (ref 22–32)
Calcium: 8.1 mg/dL — ABNORMAL LOW (ref 8.9–10.3)
Chloride: 99 mmol/L (ref 98–111)
Creatinine, Ser: 1.84 mg/dL — ABNORMAL HIGH (ref 0.44–1.00)
GFR calc Af Amer: 33 mL/min — ABNORMAL LOW (ref >60)
GFR calc non Af Amer: 29 mL/min — ABNORMAL LOW (ref >60)
Glucose, Bld: 111 mg/dL — ABNORMAL HIGH (ref 70–99)
Potassium: 3.3 mmol/L — ABNORMAL LOW (ref 3.5–5.1)
Sodium: 134 mmol/L — ABNORMAL LOW (ref 135–145)

## 2019-08-26 LAB — TROPONIN I (HIGH SENSITIVITY): Troponin I (High Sensitivity): 11 ng/L (ref <18)

## 2019-08-26 LAB — SARS CORONAVIRUS 2 BY RT PCR (HOSPITAL ORDER, PERFORMED IN ~~LOC~~ HOSPITAL LAB): SARS Coronavirus 2: NEGATIVE

## 2019-08-26 LAB — LACTIC ACID, PLASMA: Lactic Acid, Venous: 1.4 mmol/L (ref 0.5–1.9)

## 2019-08-26 MED ORDER — ONDANSETRON HCL 4 MG PO TABS: 4.0000 mg | ORAL_TABLET | Freq: Four times a day (QID) | ORAL | Status: DC | PRN

## 2019-08-26 MED ORDER — POLYETHYLENE GLYCOL 3350 17 G PO PACK: 17.0000 g | PACK | Freq: Every day | ORAL | Status: DC | PRN

## 2019-08-26 MED ORDER — SODIUM CHLORIDE 0.9% FLUSH
3.0000 mL | Freq: Two times a day (BID) | INTRAVENOUS | Status: DC
  Administered 2019-08-26 – 2019-08-29 (×5): 3 mL via INTRAVENOUS

## 2019-08-26 MED ORDER — ALPRAZOLAM 0.5 MG PO TABS
1.0000 mg | ORAL_TABLET | Freq: Two times a day (BID) | ORAL | Status: DC | PRN
  Administered 2019-08-26 – 2019-08-28 (×6): 1 mg via ORAL
  Filled 2019-08-26: qty 4
  Filled 2019-08-26 (×2): qty 2
  Filled 2019-08-26: qty 4
  Filled 2019-08-26 (×2): qty 2
  Filled 2019-08-26: qty 4

## 2019-08-26 MED ORDER — TRAZODONE HCL 50 MG PO TABS: 50.0000 mg | ORAL_TABLET | Freq: Every evening | ORAL | Status: DC | PRN

## 2019-08-26 MED ORDER — BUSPIRONE HCL 15 MG PO TABS
30.0000 mg | ORAL_TABLET | Freq: Two times a day (BID) | ORAL | Status: DC
  Administered 2019-08-26 – 2019-08-29 (×6): 30 mg via ORAL
  Filled 2019-08-26 (×4): qty 2
  Filled 2019-08-26: qty 6
  Filled 2019-08-26: qty 2
  Filled 2019-08-26: qty 6

## 2019-08-26 MED ORDER — LACTATED RINGERS IV SOLN: INTRAVENOUS | Status: DC

## 2019-08-26 MED ORDER — LACTATED RINGERS IV BOLUS
1000.0000 mL | Freq: Once | INTRAVENOUS | Status: AC
  Administered 2019-08-26: 1000 mL via INTRAVENOUS

## 2019-08-26 MED ORDER — ACETAMINOPHEN 650 MG RE SUPP: 650.0000 mg | Freq: Four times a day (QID) | RECTAL | Status: DC | PRN

## 2019-08-26 MED ORDER — ONDANSETRON HCL 4 MG/2ML IJ SOLN
4.0000 mg | Freq: Four times a day (QID) | INTRAMUSCULAR | Status: DC
  Administered 2019-08-26 – 2019-08-27 (×6): 4 mg via INTRAVENOUS
  Filled 2019-08-26 (×5): qty 2

## 2019-08-26 MED ORDER — CARISOPRODOL 350 MG PO TABS
350.0000 mg | ORAL_TABLET | Freq: Three times a day (TID) | ORAL | Status: DC | PRN
  Administered 2019-08-26 – 2019-08-29 (×6): 350 mg via ORAL
  Filled 2019-08-26 (×13): qty 1

## 2019-08-26 MED ORDER — IOHEXOL 300 MG/ML  SOLN
75.0000 mL | Freq: Once | INTRAMUSCULAR | Status: AC | PRN
  Administered 2019-08-26: 75 mL via INTRAVENOUS

## 2019-08-26 MED ORDER — LOPERAMIDE HCL 2 MG PO CAPS
2.0000 mg | ORAL_CAPSULE | ORAL | Status: DC | PRN
  Administered 2019-08-26: 2 mg via ORAL
  Filled 2019-08-26: qty 1

## 2019-08-26 MED ORDER — KETOROLAC TROMETHAMINE 30 MG/ML IJ SOLN
30.0000 mg | Freq: Once | INTRAMUSCULAR | Status: DC
  Filled 2019-08-26: qty 1

## 2019-08-26 MED ORDER — ENOXAPARIN SODIUM 30 MG/0.3ML ~~LOC~~ SOLN
30.0000 mg | SUBCUTANEOUS | Status: DC
  Administered 2019-08-26 – 2019-08-27 (×2): 30 mg via SUBCUTANEOUS
  Filled 2019-08-26 (×3): qty 0.3

## 2019-08-26 MED ORDER — POTASSIUM CHLORIDE CRYS ER 20 MEQ PO TBCR
40.0000 meq | EXTENDED_RELEASE_TABLET | Freq: Once | ORAL | Status: AC
  Administered 2019-08-26: 40 meq via ORAL
  Filled 2019-08-26: qty 2

## 2019-08-26 MED ORDER — SODIUM CHLORIDE 0.9 % IV SOLN
1.0000 g | Freq: Once | INTRAVENOUS | Status: AC
  Administered 2019-08-26: 1 g via INTRAVENOUS
  Filled 2019-08-26: qty 10

## 2019-08-26 MED ORDER — ONDANSETRON HCL 4 MG/2ML IJ SOLN: 4.0000 mg | Freq: Four times a day (QID) | INTRAMUSCULAR | Status: DC | PRN

## 2019-08-26 MED ORDER — ENOXAPARIN SODIUM 40 MG/0.4ML ~~LOC~~ SOLN: 40.0000 mg | SUBCUTANEOUS | Status: DC

## 2019-08-26 MED ORDER — SODIUM CHLORIDE 0.9 % IV SOLN
1.0000 g | INTRAVENOUS | Status: DC
  Administered 2019-08-27 – 2019-08-29 (×3): 1 g via INTRAVENOUS
  Filled 2019-08-26: qty 10
  Filled 2019-08-26: qty 1
  Filled 2019-08-26: qty 10

## 2019-08-26 MED ORDER — ACETAMINOPHEN 325 MG PO TABS
650.0000 mg | ORAL_TABLET | Freq: Four times a day (QID) | ORAL | Status: DC | PRN
  Administered 2019-08-26 – 2019-08-27 (×3): 650 mg via ORAL
  Filled 2019-08-26 (×3): qty 2

## 2019-08-26 NOTE — Progress Notes (Signed)
Anticoagulation monitoring(Lovenox):  64yo  female ordered Lovenox 40 mg Q24h for DVT prevention  Filed Weights   08/25/19 2102  Weight: 68.9 kg (152 lb)   BMI 28.72   Lab Results  Component Value Date   CREATININE 1.84 (H) 08/26/2019   CREATININE 1.48 (H) 08/25/2019   CREATININE 0.75 06/27/2019   Estimated Creatinine Clearance: 28.1 mL/min (A) (by C-G formula based on SCr of 1.84 mg/dL (H)). Hemoglobin & Hematocrit     Component Value Date/Time   HGB 11.6 (L) 08/25/2019 2109   HGB 12.8 04/25/2017 1456   HCT 32.7 (L) 08/25/2019 2109   HCT 39.6 04/25/2017 1456     Per Protocol for Patient with estCrcl < 30 ml/min and BMI < 40, will transition to Lovenox 30 mg Q24h.     Paulina Fusi, PharmD, BCPS 08/26/2019 11:38 AM

## 2019-08-26 NOTE — ED Notes (Signed)
Pts brief changed. External catheter in place.

## 2019-08-26 NOTE — ED Provider Notes (Signed)
[facility] Emergency Department Provider Note  ____________________________________________   First MD Initiated Contact with Patient 08/26/19 [phone]     (approximate)  I have reviewed the triage vital signs and the nursing notes.   HISTORY  Chief Complaint Abdominal Pain, Emesis, and Weakness   HPI Janet Zuniga is a 64 y.o. female with below list of previous medical conditions presents to the emergency department secondary to 2-day history of abdominal discomfort, objective fevers at home, nausea vomiting and generalized weakness.  Patient states that symptoms have worsened over the past 2 days.  Patient also admits to poor p.o. intake secondary to nausea and vomiting.  Patient does admit to urinary frequency and urgency however no dysuria.  Patient does admit to back pain as well.  Current pain score is 6 out of 10.       Past Medical History:  Diagnosis Date  . Ankylosing spondylitis ([place])   . Anxiety   . Arthritis   . Blood in stool   . Breast cancer ([place]) 2010   Left breast- chemo/radiation  . Chronic low back pain   . Chronic pain   . Depression   . Headache   . Hepatitis C 2007  . Hip fracture ([place])   . History of blood transfusion   . Hypertension   . Insomnia   . OA (osteoarthritis)   . Osteoporosis   . Personal history of chemotherapy   . Personal history of radiation therapy   . Spinal stenosis of lumbar region   . Spondyloarthritis     Patient Active Problem List   Diagnosis Date Noted  . Tobacco use disorder 10/17/2018  . 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 Depression, major, recurrent, moderate ([place]) 02/03/2018  . 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 Degenerative disc disease, lumbar 09/20/2016  . Shoulder joint pain 09/18/2016  . Hypertension   . Ankylosing spondylitis ([place])   . OA (osteoarthritis)   .  Osteoporosis   . Malignant neoplasm of left female breast ([place]) 09/12/2015  . Cocaine abuse ([place]) 08/18/2015  . Alcohol abuse 08/03/2015  . Chronic use of opiate drugs therapeutic purposes 05/19/2015  . Leg reflex sympathetic dystrophy, right 05/19/2015  . Fracture of shaft of femur ([place]) 01/12/2015  . Anxiety 01/12/2015  . Anxiety and depression 12/24/2014  . Chronic bilateral low back pain with bilateral sciatica 12/24/2014  . Substance induced mood disorder ([place]) 10/27/2014  . Chronic pain in right foot 10/20/2014  . Acute bronchitis 10/20/2014  . Insomnia 06/05/2014  . Chronic pain syndrome 05/15/2014  . Raynaud's phenomenon without gangrene 05/15/2014  . Spinal stenosis of lumbar region 05/15/2014  . 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 Generalized anxiety disorder 05/04/2014  . Hepatitis C 03/12/2014  . Midline low back pain without sciatica 03/12/2014  . History of breast cancer in female 12/23/2013  . Chronic pain 12/23/2013  . Ankylosing spondylitis of lumbosacral region ([place]) 12/23/2013  . Depression 12/23/2013  . Osteoarthritis 10/05/2011    Past Surgical History:  Procedure Laterality Date  . BREAST BIOPSY Right 1983   neg  . BREAST EXCISIONAL BIOPSY Left 2010   triple neg  . BREAST LUMPECTOMY Left 2010  . FEMUR FRACTURE SURGERY    . FOOT SURGERY  2000 and 2003  . INTRAMEDULLARY (IM) NAIL INTERTROCHANTERIC Right 01/12/2015   Procedure: INTRAMEDULLARY (IM) NAIL INTERTROCHANTRIC;  Surgeon: Earnestine Leys, MD;  Location: [facility] ORS;  Service: Orthopedics;  Laterality: Right;  . KNEE SURGERY Left 2004  . TUBAL LIGATION      Prior to Admission medications   Medication Sig Start Date End Date Taking? Authorizing Provider  ALPRAZolam Duanne Moron) 1 MG tablet Take 1 tablet (1 mg total) by mouth 2 (two) times daily as needed for anxiety. 04/18/17   Volney American, PA-C  busPIRone (BUSPAR) 30 MG tablet Take 1 tablet by mouth 2 (two) times daily. 07/18/17   [provider]  carisoprodol (SOMA) 350 MG tablet Take 1 tablet by mouth 3 (three) times daily as needed for muscle spasms. 01/25/18   [provider]  clindamycin (CLEOCIN) 300 MG capsule Take 300 mg by mouth every 6 (six) hours. 04/15/19   [provider]  etanercept (ENBREL) 50 MG/ML injection Inject 1 mL into the skin once a week. 07/10/18   [provider]  FLUoxetine (PROZAC) 40 MG capsule Take 1 capsule (40 mg total) by mouth daily. 04/25/17   Volney American, PA-C  furosemide (LASIX) 40 MG tablet Take 1 tablet (40 mg total) by mouth daily. 04/27/17   Volney American, PA-C  HYDROcodone-acetaminophen [facility]) 7.5-325 MG tablet  04/15/19   [provider]  HYDROcodone-acetaminophen (NORCO/VICODIN) 5-325 MG tablet Take 1 tablet by mouth every 4 (four) hours as needed for moderate pain. 06/27/19 06/26/20  Harvest Dark, MD  ibuprofen (ADVIL,MOTRIN) 800 MG tablet Take 1 tablet by mouth every 8 (eight) hours as needed for pain. 01/16/18   [provider]  lansoprazole (PREVACID) 15 MG capsule Take 15 mg by mouth daily as needed.    [provider]  magnesium oxide (MAG-OX) 400 MG tablet Take 1 tablet by mouth daily. 08/16/18   [provider]  meloxicam (MOBIC) 15 MG tablet Take 15 mg by mouth daily.    [provider]  oxyCODONE-acetaminophen (PERCOCET) 5-325 MG tablet Take 1 tablet by mouth every 6 (six) hours as needed for severe pain. 01/02/18   Sable Feil, PA-C  traZODone (DESYREL) 150 MG tablet Take 1 tablet (150 mg total) by mouth at bedtime as needed for sleep. 04/25/17   Volney American, PA-C    Allergies Tramadol and Penicillins  Family History  Problem Relation Age of Onset  . Arthritis Mother   . Stroke Mother   . Heart disease Mother   . Hypertension Mother   . Hyperlipidemia Mother   . Depression Mother   . Seizures Mother   . Osteoporosis Mother   . Hypertension Father   .  Hypertension Brother   . Arthritis Brother   . Depression Brother   . Diabetes Brother   . Hyperlipidemia Brother   . Heart disease Maternal Aunt   . Breast cancer Maternal Aunt   . Seizures Sister   . Stroke Sister   . COPD Neg Hx   . Cancer Neg Hx     Social History Social History   Tobacco Use  . Smoking status: Current Every Day Smoker    Packs/day: 0.20    Years: 25.00    Pack years: 5.00    Types: Cigarettes  . Smokeless tobacco: Never Used  Vaping Use  . Vaping Use: Never used  Substance Use Topics  . Alcohol use: Yes    Comment: occasionally a glass of wine cooler  . Drug use: Yes    Types: Marijuana    Comment: occasionally     Review of Systems Constitutional: No fever/chills Eyes: No visual changes. ENT: No sore  throat. Cardiovascular: Denies chest pain. Respiratory: Denies shortness of breath. Gastrointestinal: Positive for abdominal pain nausea and vomiting no diarrhea.  No constipation. Genitourinary: Negative for dysuria. Musculoskeletal: Negative for neck pain.  Positive for back pain. Integumentary: Negative for rash. Neurological: Negative for headaches, focal weakness or numbness.  ____________________________________________   PHYSICAL EXAM:  VITAL SIGNS: ED Triage Vitals  Enc Vitals Group     BP 08/25/19 2106 97/60     Pulse Rate 08/25/19 2106 (!) 119     Resp 08/25/19 2106 20     Temp 08/25/19 2106 (!) 101.3 F (38.5 C)     Temp Source 08/25/19 2106 Oral     SpO2 08/25/19 2106 96 %     Weight 08/25/19 2102 68.9 kg (152 lb)     Height 08/25/19 2102 1.549 m ('5\' 1"' )     Head Circumference --      Peak Flow --      Pain Score 08/25/19 2102 8     Pain Loc --      Pain Edu? --      Excl. in [place]? --     Constitutional: Alert and oriented.  Eyes: Conjunctivae are normal.  Head: Atraumatic. Mouth/Throat: Patient is wearing a mask. Neck: No stridor.  No meningeal signs.   Cardiovascular: Normal rate, regular rhythm. Good peripheral  circulation. Grossly normal heart sounds. Respiratory: Normal respiratory effort.  No retractions. Gastrointestinal: Soft and nontender. No distention.  Genitourinary: Right CVA tenderness to percussion Musculoskeletal: No lower extremity tenderness nor edema. No gross deformities of extremities. Neurologic:  Normal speech and language. No gross focal neurologic deficits are appreciated.  Skin:  Skin is warm, dry and intact. Psychiatric: Mood and affect are normal. Speech and behavior are normal.  ____________________________________________   LABS (all labs ordered are listed, but only abnormal results are displayed)  Labs Reviewed  COMPREHENSIVE METABOLIC PANEL - Abnormal; Notable for the following components:      Result Value   Sodium 131 (*)    Potassium 2.8 (*)    Chloride 96 (*)    CO2 20 (*)    Glucose, Bld 163 (*)    Creatinine, Ser 1.48 (*)    GFR calc non Af Amer 38 (*)    GFR calc Af Amer 44 (*)    All other components within normal limits  CBC - Abnormal; Notable for the following components:   WBC 18.6 (*)    RBC 3.72 (*)    Hemoglobin 11.6 (*)    HCT 32.7 (*)    All other components within normal limits  CULTURE, BLOOD (SINGLE)  CULTURE, BLOOD (ROUTINE X 2)  CULTURE, BLOOD (ROUTINE X 2)  LIPASE, BLOOD  URINALYSIS, COMPLETE (UACMP) WITH MICROSCOPIC  LACTIC ACID, PLASMA  LACTIC ACID, PLASMA  TROPONIN I (HIGH SENSITIVITY)   ____________________________________________  EKG  ED ECG REPORT I, [place] Janett Kamath, the attending physician, personally viewed and interpreted this ECG.   Date: 08/25/2019  EKG Time: 9:00 PM  Rate: 119  Rhythm: Sinus tachycardia  Axis: Normal  Intervals: Normal  ST&T Change: None  ____________________________________________  RADIOLOGY I, [place] Jeston Junkins, personally viewed and evaluated these images (plain radiographs) as part of my medical decision making, as well as reviewing the written report by the radiologist.  ED  MD interpretation: Right pyelonephritis on CT per radiologist.  Official radiology report(s): DG Chest 2 View  Result Date: 08/26/2019 CLINICAL DATA:  Fever EXAM: CHEST - 2 VIEW COMPARISON:  08/14/2017 FINDINGS: Slight  opacification of the left lung base is likely related to the asymmetric overlying soft tissue. The lungs are well expanded, are symmetric, and are clear. No pneumothorax or pleural effusion. Cardiac size within normal limits. Pulmonary vascularity normal. Interval development of L1 and L2 compression fractures with approximately 50% loss of height. Stable T8 compression fracture with 20-30% loss of height. Asymmetry of the breast tissue is again identified possibly related to partial left breast resection. IMPRESSION: 1. No acute cardiopulmonary disease. 2. Interval development of L1 and L2 compression fractures with approximately 50% loss of height. Electronically Signed   By: Fidela Salisbury MD   On: 08/26/2019 02:52   CT Abdomen Pelvis W Contrast  Result Date: 08/26/2019 CLINICAL DATA:  Abdominal pain, acute, nonlocalized EXAM: CT ABDOMEN AND PELVIS WITH CONTRAST TECHNIQUE: Multidetector CT imaging of the abdomen and pelvis was performed using the standard protocol following bolus administration of intravenous contrast. CONTRAST:  76m OMNIPAQUE IOHEXOL 300 MG/ML  SOLN COMPARISON:  06/27/2019 FINDINGS: Lower chest:  Heavily calcified aorta where covered. Hepatobiliary: No focal liver abnormality.No evidence of biliary obstruction or stone. Pancreas: Unremarkable. Spleen: Unremarkable. Adrenals/Urinary Tract: Negative adrenals. Patchy hypoenhancement of the right renal cortex with right renal expansion and perinephric haziness. No hydronephrosis or abscess. Collapsed urinary bladder. Stomach/Bowel:  No obstruction. No visible bowel inflammation. Vascular/Lymphatic: No acute vascular abnormality. Calcified plaque of the aorta and iliacs. No mass or adenopathy. Reproductive:14 mm left ovarian  cystic density, below size threshold requiring sonographic follow-up per consensus guidelines. Tubal ligation clips. Other: No ascites or pneumoperitoneum. Musculoskeletal: No acute abnormalities. Right femoral fixation. Remote T8, T12, and L1 compression fractures. Based on the numbering scheme, T12 is non rib-bearing. IMPRESSION: Right pyelonephritis.  No hydronephrosis or abscess. Electronically Signed   By: JMonte FantasiaM.D.   On: 08/26/2019 04:59    _______________________________________ .Critical Care Performed by: BGregor Hams MD Authorized by: BGregor Hams MD   Critical care provider statement:    Critical care time (minutes):  30   Critical care time was exclusive of:  Separately billable procedures and treating other patients   Critical care was necessary to treat or prevent imminent or life-threatening deterioration of the following conditions:  Sepsis   Critical care was time spent personally by me on the following activities:  Development of treatment plan with patient or surrogate, discussions with consultants, evaluation of patient's response to treatment, examination of patient, obtaining history from patient or surrogate, ordering and performing treatments and interventions, ordering and review of laboratory studies, ordering and review of radiographic studies, pulse oximetry, re-evaluation of patient's condition and review of old charts     ____________________________________________   INITIAL IMPRESSION / MDM / [place]/ ED COURSE  As part of my medical decision making, I reviewed the following data within the electronic MEDICAL RECORD NUMBER   64year old female presented with above-stated history and physical exam a differential diagnosis including but not limited to pyelonephritis, diverticulitis, pancreatitis.  Laboratory data revealed a white blood cell count of 18.6, creatinine of 1.48, potassium of 2.8.  Patient given potassium chloride 40 mEq  p.o.  Patient met sepsis criteria and as such sepsis protocol was initiated.  Patient given IV ceftriaxone 1 g after reviewing CT scan that revealed evidence of pyelonephritis which is consistent with the patient's clinical exam.  Patient also given IV lactated Ringer's 1 L.  Patient be admitted to the hospital staff for further evaluation and management.  ____________________________________________  FINAL CLINICAL IMPRESSION(S) /  ED DIAGNOSES  Final diagnoses:  Acute pyelonephritis  Sepsis, due to unspecified organism, unspecified whether acute organ dysfunction present [facility])     MEDICATIONS GIVEN DURING THIS VISIT:  Medications  cefTRIAXone (ROCEPHIN) 1 g in sodium chloride 0.9 % 100 mL IVPB (1 g Intravenous New Bag/Given 08/26/19 0607)  lactated ringers infusion (has no administration in time range)  ketorolac (TORADOL) 30 MG/ML injection 30 mg (has no administration in time range)  acetaminophen (TYLENOL) tablet 650 mg (650 mg Oral Given 08/25/19 2109)  lactated ringers bolus 1,000 mL (0 mLs Intravenous Stopped 08/26/19 0546)  iohexol (OMNIPAQUE) 300 MG/ML solution 75 mL (75 mLs Intravenous Contrast Given 08/26/19 0428)  potassium chloride SA (KLOR-CON) CR tablet 40 mEq (40 mEq Oral Given 08/26/19 0606)     ED Discharge Orders    None      *Please note:  WESLYNN KE was evaluated in Emergency Department on 08/26/2019 for the symptoms described in the history of present illness. She was evaluated in the context of the global COVID-19 pandemic, which necessitated consideration that the patient might be at risk for infection with the SARS-CoV-2 virus that causes COVID-19. Institutional protocols and algorithms that pertain to the evaluation of patients at risk for COVID-19 are in a state of rapid change based on information released by regulatory bodies including the CDC and federal and state organizations. These policies and algorithms were followed during the patient's care in the ED.   Some ED evaluations and interventions may be delayed as a result of limited staffing during and after the pandemic.*  Note:  This document was prepared using Dragon voice recognition software and may include unintentional dictation errors.   Gregor Hams, MD 08/26/19 [phone]

## 2019-08-26 NOTE — ED Triage Notes (Signed)
Emergency Medicine Provider Triage Evaluation Note  Janet Zuniga , a 64 y.o. female  was evaluated in triage.  Pt complains of bilateral lower quadrant abdominal pain for the past two days..  Review of Systems  Positive: Abdominal pain, weakness, nausea, vomiting Negative: Diarrhea, constipation, cough, chest pain, shortness of breath  Physical Exam  BP (!) 90/49 (BP Location: Right Arm)   Pulse 88   Temp 98.3 F (36.8 C) (Oral)   Resp 18   Ht 5\' 1"  (1.549 m)   Wt 68.9 kg   SpO2 97%   BMI 28.72 kg/m  Gen:   Awake, no distress   HEENT:  Atraumatic  Resp:  Normal effort  Cardiac:  Normal rate, regular rhythm Abd:   Nondistended, tender to palpation over bilateral lower quadrants MSK:   Moves extremities without difficulty  Neuro:  Speech clear  Medical Decision Making  Medically screening exam initiated at 2:16 AM.  Appropriate orders placed.  Janet Zuniga was informed that the remainder of the evaluation will be completed by another provider, this initial triage assessment does not replace that evaluation, and the importance of remaining in the ED until their evaluation is complete.  Clinical Impression   Patient with 2 days of abdominal pain, noted to be febrile and tachycardic in triage with elevated WBC and tenderness on exam. Will further assess with CT abdomen/pelvis.   Blake Divine, MD 08/26/19 [phone]

## 2019-08-26 NOTE — ED Notes (Signed)
Pt provided cola per request.

## 2019-08-26 NOTE — Progress Notes (Signed)
CODE SEPSIS - PHARMACY COMMUNICATION  **Broad Spectrum Antibiotics should be administered within 1 hour of Sepsis diagnosis**  Time Code Sepsis Called/Page Received: 1252  Antibiotics Ordered: ceftriaxone  Time of 1st antibiotic administration: 0607  Additional action taken by pharmacy:   If necessary, Name of Provider/Nurse Contacted:     Tobie Lords ,PharmD Clinical Pharmacist  08/26/2019  6:27 AM

## 2019-08-26 NOTE — H&P (Signed)
[facility] History and Physical  Janet Zuniga AOZ:[id number] DOB: 1955-02-06 DOA: 08/26/2019  Referring physician: Dr. Owens Shark PCP: Baxter Hire, MD   Chief Complaint: Abdominal pain  HPI: Janet Zuniga is a 64 y.o. female with history of hypertension, polysubstance use disorder, hepatitis C, ankylosing spondylitis, osteoporosis, anxiety depression, who presents with abdominal pain.  Patient reports she has been feeling unwell since last Friday 4 days prior.  Her symptoms have been primarily nausea, vomiting, diffuse abdominal pain.  No blood in her vomit.  She is also felt weak, and been unable to take good p.o. over this time.  She denies any back pain or burning or pain with urination though she has had frequency and urgency.  In the ED initial vital signs notable for fever to 101.3 Fahrenheit and mild hypotension with blood pressures initially ranging 90s over 60 subsequently improved to 109/68.  Lab work-up notable for CMP showing hyponatremia of 131, hypokalemia 2.8, and AKI with creatinine 1.48 up from baseline of 0.7 previously.  Lactic acid was normal.  CBC showed elevated white count of 18 and mild anemia with hemoglobin of 11.6, UA was also obtained which showed moderate RBCs and large leukocytes.  CT abdomen pelvis was obtained which showed right pyelonephritis.  Chest x-ray was also obtained which showed no acute chest disease but did show new compression fractures of L1 and L2.  EKG obtained showed sinus tachycardia and some flattening of the lateral T waves but no other ST or ischemic changes.  He was given 1 L LR bolus and started on LR at 150 cc an hour, as well as 1 g of IV ceftriaxone and potassium repletion.  She was admitted for further management of her sepsis secondary to pyelonephritis.   Review of Systems:  Pertinent positives and negative per HPI, all others reviewed and negative   Past Medical History:  Diagnosis Date  . Ankylosing spondylitis ([place])   .  Anxiety   . Arthritis   . Blood in stool   . Breast cancer ([place]) 2010   Left breast- chemo/radiation  . Chronic low back pain   . Chronic pain   . Depression   . Headache   . Hepatitis C 2007  . Hip fracture ([place])   . History of blood transfusion   . Hypertension   . Insomnia   . OA (osteoarthritis)   . Osteoporosis   . Personal history of chemotherapy   . Personal history of radiation therapy   . Spinal stenosis of lumbar region   . Spondyloarthritis    Past Surgical History:  Procedure Laterality Date  . BREAST BIOPSY Right 1983   neg  . BREAST EXCISIONAL BIOPSY Left 2010   triple neg  . BREAST LUMPECTOMY Left 2010  . FEMUR FRACTURE SURGERY    . FOOT SURGERY  2000 and 2003  . INTRAMEDULLARY (IM) NAIL INTERTROCHANTERIC Right 01/12/2015   Procedure: INTRAMEDULLARY (IM) NAIL INTERTROCHANTRIC;  Surgeon: Earnestine Leys, MD;  Location: [facility] ORS;  Service: Orthopedics;  Laterality: Right;  . KNEE SURGERY Left 2004  . TUBAL LIGATION     Social History:  reports that she has been smoking cigarettes. She has a 5.00 pack-year smoking history. She has never used smokeless tobacco. She reports current alcohol use. She reports current drug use. Drug: Marijuana.  Allergies  Allergen Reactions  . Tramadol Nausea Only  . Penicillins Rash and Other (See Comments)    Has patient had a PCN reaction causing immediate rash, facial/tongue/throat  swelling, SOB or lightheadedness with hypotension: Yes Has patient had a PCN reaction causing severe rash involving mucus membranes or skin necrosis: No Has patient had a PCN reaction that required hospitalization No Has patient had a PCN reaction occurring within the last 10 years: No If all of the above answers are "NO", then may proceed with Cephalosporin use.     Family History  Problem Relation Age of Onset  . Arthritis Mother   . Stroke Mother   . Heart disease Mother   . Hypertension Mother   . Hyperlipidemia Mother   . Depression  Mother   . Seizures Mother   . Osteoporosis Mother   . Hypertension Father   . Hypertension Brother   . Arthritis Brother   . Depression Brother   . Diabetes Brother   . Hyperlipidemia Brother   . Heart disease Maternal Aunt   . Breast cancer Maternal Aunt   . Seizures Sister   . Stroke Sister   . COPD Neg Hx   . Cancer Neg Hx     Prior to Admission medications   Medication Sig Start Date End Date Taking? Authorizing Provider  ALPRAZolam Duanne Moron) 1 MG tablet Take 1 tablet (1 mg total) by mouth 2 (two) times daily as needed for anxiety. 04/18/17   Volney American, PA-C  busPIRone (BUSPAR) 30 MG tablet Take 1 tablet by mouth 2 (two) times daily. 07/18/17   [provider]  carisoprodol (SOMA) 350 MG tablet Take 1 tablet by mouth 3 (three) times daily as needed for muscle spasms. 01/25/18   [provider]  etanercept (ENBREL) 50 MG/ML injection Inject 1 mL into the skin once a week. 07/10/18   [provider]  FLUoxetine (PROZAC) 40 MG capsule Take 1 capsule (40 mg total) by mouth daily. 04/25/17   Volney American, PA-C  furosemide (LASIX) 40 MG tablet Take 1 tablet (40 mg total) by mouth daily. 04/27/17   Volney American, PA-C  ibuprofen (ADVIL,MOTRIN) 800 MG tablet Take 1 tablet by mouth every 8 (eight) hours as needed for pain. 01/16/18   [provider]  lansoprazole (PREVACID) 15 MG capsule Take 15 mg by mouth daily as needed.    [provider]  magnesium oxide (MAG-OX) 400 MG tablet Take 1 tablet by mouth daily. 08/16/18   [provider]  meloxicam (MOBIC) 15 MG tablet Take 15 mg by mouth daily.    [provider]  traZODone (DESYREL) 150 MG tablet Take 1 tablet (150 mg total) by mouth at bedtime as needed for sleep. 04/25/17   Volney American, PA-C   Physical Exam: Vitals:   08/25/19 2102 08/25/19 2106 08/26/19 0211 08/26/19 0541  BP:  97/60 (!) 90/49 109/68  Pulse:  (!) 119 88 79  Resp:  20 18  20   Temp:  (!) 101.3 F (38.5 C) 98.3 F (36.8 C)   TempSrc:  Oral Oral   SpO2:  96% 97% 100%  Weight: 68.9 kg     Height: 5\' 1"  (1.549 m)       Wt Readings from Last 3 Encounters:  08/25/19 68.9 kg  06/27/19 65.8 kg  10/18/18 66.7 kg    General:  Appears calm and comfortable Eyes: PERRL, normal lids, irises & conjunctiva ENT: grossly normal hearing, mildly dry mucous membranes Neck: no masses Cardiovascular: RRR, no m/r/g. No LE edema. Respiratory: CTA bilaterally, no w/r/r. Normal respiratory effort. Abdomen: soft, diffuse mild tenderness to palpation Skin: no rash or induration  seen on limited exam Musculoskeletal: grossly normal tone BUE/BLE Psychiatric: grossly normal mood and affect, speech fluent and appropriate Neurologic: grossly non-focal.          Labs on Admission:  Basic Metabolic Panel: Recent Labs  Lab 08/25/19 2109  NA 131*  K 2.8*  CL 96*  CO2 20*  GLUCOSE 163*  BUN 20  CREATININE 1.48*  CALCIUM 8.9   Liver Function Tests: Recent Labs  Lab 08/25/19 2109  AST 24  ALT 16  ALKPHOS 80  BILITOT 1.0  PROT 7.7  ALBUMIN 3.6   Recent Labs  Lab 08/25/19 2109  LIPASE 21   No results for input(s): AMMONIA in the last 168 hours. CBC: Recent Labs  Lab 08/25/19 2109  WBC 18.6*  HGB 11.6*  HCT 32.7*  MCV 87.9  PLT 190   Cardiac Enzymes: No results for input(s): CKTOTAL, CKMB, CKMBINDEX, TROPONINI in the last 168 hours.  BNP (last 3 results) No results for input(s): BNP in the last 8760 hours.  ProBNP (last 3 results) No results for input(s): PROBNP in the last 8760 hours.  CBG: No results for input(s): GLUCAP in the last 168 hours.  Radiological Exams on Admission: DG Chest 2 View  Result Date: 08/26/2019 CLINICAL DATA:  Fever EXAM: CHEST - 2 VIEW COMPARISON:  08/14/2017 FINDINGS: Slight opacification of the left lung base is likely related to the asymmetric overlying soft tissue. The lungs are well expanded, are symmetric, and  are clear. No pneumothorax or pleural effusion. Cardiac size within normal limits. Pulmonary vascularity normal. Interval development of L1 and L2 compression fractures with approximately 50% loss of height. Stable T8 compression fracture with 20-30% loss of height. Asymmetry of the breast tissue is again identified possibly related to partial left breast resection. IMPRESSION: 1. No acute cardiopulmonary disease. 2. Interval development of L1 and L2 compression fractures with approximately 50% loss of height. Electronically Signed   By: Fidela Salisbury MD   On: 08/26/2019 02:52   CT Abdomen Pelvis W Contrast  Result Date: 08/26/2019 CLINICAL DATA:  Abdominal pain, acute, nonlocalized EXAM: CT ABDOMEN AND PELVIS WITH CONTRAST TECHNIQUE: Multidetector CT imaging of the abdomen and pelvis was performed using the standard protocol following bolus administration of intravenous contrast. CONTRAST:  26mL OMNIPAQUE IOHEXOL 300 MG/ML  SOLN COMPARISON:  06/27/2019 FINDINGS: Lower chest:  Heavily calcified aorta where covered. Hepatobiliary: No focal liver abnormality.No evidence of biliary obstruction or stone. Pancreas: Unremarkable. Spleen: Unremarkable. Adrenals/Urinary Tract: Negative adrenals. Patchy hypoenhancement of the right renal cortex with right renal expansion and perinephric haziness. No hydronephrosis or abscess. Collapsed urinary bladder. Stomach/Bowel:  No obstruction. No visible bowel inflammation. Vascular/Lymphatic: No acute vascular abnormality. Calcified plaque of the aorta and iliacs. No mass or adenopathy. Reproductive:14 mm left ovarian cystic density, below size threshold requiring sonographic follow-up per consensus guidelines. Tubal ligation clips. Other: No ascites or pneumoperitoneum. Musculoskeletal: No acute abnormalities. Right femoral fixation. Remote T8, T12, and L1 compression fractures. Based on the numbering scheme, T12 is non rib-bearing. IMPRESSION: Right pyelonephritis.  No  hydronephrosis or abscess. Electronically Signed   By: Monte Fantasia M.D.   On: 08/26/2019 04:59    EKG: Independently reviewed.  Sinus tachycardia, some flattening of lateral T waves but no other ST segment or ischemic changes.  Assessment/Plan Active Problems:   History of breast cancer in female   Chronic pain   Depression   Substance induced mood disorder (HCC)   Anxiety   Hepatitis C   Hypertension  Ankylosing spondylitis ([place])   Osteoporosis   Tobacco use disorder   Sepsis secondary to UTI ([place])  #Sepsis 2/2 UTI Patient febrile and tachycardic on arrival meeting criteria for sepsis.  CT abdomen pelvis showing right-sided pyelonephritis.  Urine culture not initially obtained we will attempt to add on to prior collection for UA.  She will require admission for IV antibiotics. -IV ceftriaxone 1 g every 24 hours -Follow-up urine culture -LR at 125 cc an hour  #Hypokalemia Patient reports significant nausea and vomiting as well as poor p.o. intake over the past several days, will replete aggressively and trend BMP.  #Hyponatremia Likely secondary to hypovolemia, receiving fluid resuscitation, will trend BMP.  #AKI Likely prerenal, receiving fluid resuscitation, will trend BMP.  #Chronic medical problems Anxiety/depression: Continue Xanax (PDMP confirmed), trazodone and buspar PRN Chronic pain: Continue carisoprodol (PDMP confirmed)   Code Status: Full Code, confirmed DVT Prophylaxis: Lovenox Family Communication: Son Hassell Halim [phone]) updated by phone Disposition Plan: Inpatient  Time spent: 50 min  Clarnce Flock MD/MPH [facility]

## 2019-08-26 NOTE — ED Notes (Signed)
Pt in rm 13 from [place]. Pt placed on cardiac monitor. Call light within reach. Pt has no further needs at this time.

## 2019-08-26 NOTE — ED Notes (Signed)
Called dietary to have breakfast tray brought to ER. Informed pt.

## 2019-08-26 NOTE — ED Notes (Signed)
Pt sitting up eating lunch at this time

## 2019-08-27 ENCOUNTER — Inpatient Hospital Stay: Payer: Medicare Other

## 2019-08-27 DIAGNOSIS — A419 Sepsis, unspecified organism: Principal | ICD-10-CM

## 2019-08-27 DIAGNOSIS — E876 Hypokalemia: Secondary | ICD-10-CM

## 2019-08-27 DIAGNOSIS — N390 Urinary tract infection, site not specified: Secondary | ICD-10-CM

## 2019-08-27 DIAGNOSIS — N10 Acute tubulo-interstitial nephritis: Secondary | ICD-10-CM

## 2019-08-27 LAB — URINE CULTURE

## 2019-08-27 MED ORDER — POTASSIUM CHLORIDE IN NACL 20-0.9 MEQ/L-% IV SOLN
INTRAVENOUS | Status: DC
  Filled 2019-08-27 (×3): qty 1000

## 2019-08-27 MED ORDER — POTASSIUM CHLORIDE 20 MEQ PO PACK
40.0000 meq | PACK | Freq: Two times a day (BID) | ORAL | Status: AC
  Administered 2019-08-27 (×2): 40 meq via ORAL
  Filled 2019-08-27 (×2): qty 2

## 2019-08-27 MED ORDER — IPRATROPIUM-ALBUTEROL 0.5-2.5 (3) MG/3ML IN SOLN
3.0000 mL | RESPIRATORY_TRACT | Status: DC | PRN
  Administered 2019-08-27 – 2019-08-28 (×4): 3 mL via RESPIRATORY_TRACT
  Filled 2019-08-27 (×4): qty 3

## 2019-08-27 MED ORDER — MAGNESIUM SULFATE 4 GM/100ML IV SOLN: 4.0000 g | Freq: Once | INTRAVENOUS | Status: DC

## 2019-08-27 MED ORDER — SODIUM CHLORIDE 0.9 % IV SOLN
500.0000 mg | INTRAVENOUS | Status: DC
  Administered 2019-08-27 – 2019-08-28 (×2): 500 mg via INTRAVENOUS
  Filled 2019-08-27 (×3): qty 500

## 2019-08-27 MED ORDER — POTASSIUM CHLORIDE IN NACL 20-0.9 MEQ/L-% IV SOLN: INTRAVENOUS | Status: DC

## 2019-08-27 MED ORDER — SODIUM CHLORIDE 0.9 % IV BOLUS
1000.0000 mL | Freq: Once | INTRAVENOUS | Status: AC
  Administered 2019-08-27: 1000 mL via INTRAVENOUS

## 2019-08-27 NOTE — ED Notes (Signed)
Pt remains resting in NAD.  

## 2019-08-27 NOTE — ED Notes (Signed)
MD messaged about BP. Orders to hold BP meds at this time.

## 2019-08-27 NOTE — Progress Notes (Signed)
PROGRESS NOTE    Janet Zuniga  WLN:[id number] DOB: February 13, 1955 DOA: 08/26/2019 PCP: Baxter Hire, MD   Chief complaint.  Abdominal pain.  Brief Narrative: Janet Zuniga is a 64 y.o. female with history of hypertension, polysubstance use disorder, hepatitis C, ankylosing spondylitis, osteoporosis, anxiety depression, who presents with abdominal pain.  She also had a fever for the last 4 days, associated with frequent urination, back pain.  CT scan showed evidence of right pyelonephritis.  She was started on Rocephin.  8/25.  Patient had hypotension, received 1 L of normal saline bolus.   Assessment & Plan:   Active Problems:   History of breast cancer in female   Chronic pain   Depression   Substance induced mood disorder (HCC)   Anxiety   Hypokalemia   Hepatitis C   Hypertension   Ankylosing spondylitis (HCC)   Osteoporosis   Tobacco use disorder   Sepsis secondary to UTI ([place])   Acute pyelonephritis  #1.  Sepsis secondary to pyelonephritis. Patient received IV fluid bolus, currently she is hemodynamically stable.  I will continue Rocephin as well as IV fluids.  2.  Hypokalemia. Supplement.  Also added potassium into normal saline for infusion.  3.  Hyponatremia. Secondary dehydration.  Continue to follow.  4.  Acute kidney injury.  Secondary to sepsis. Continue to follow.     DVT prophylaxis: Lovenox  Code Status: Full Family Communication: None Disposition Plan:  . Patient came from: home            . Anticipated d/c place: Home . Barriers to d/c OR conditions which need to be met to effect a safe d/c:   Consultants:   None  Procedures: None Antimicrobials: Rocephin.  Subjective: Patient had some hypotension, received IV fluid bolus.  She feels better now, no additional fever chills.  No nausea vomiting or diarrhea.  No short of breath or cough.  Objective: Vitals:   08/27/19 1119 08/27/19 1135 08/27/19 1218 08/27/19 1310  BP:  90/62 (!)  89/66 (!) 89/59  Pulse:  66 91 79  Resp:  _0 Temp: 97.6 F (36.4 C)     TempSrc: Oral     SpO2:  99% 100% 98%  Weight:      Height:        Intake/Output Summary (Last 24 hours) at 08/27/2019 1545 Last data filed at 08/27/2019 1512 Gross per 24 hour  Intake 738.61 ml  Output 800 ml  Net -61.39 ml   Filed Weights   08/25/19 2102  Weight: 68.9 kg    Examination:  General exam: Appears calm and comfortable  Respiratory system: Clear to auscultation. Respiratory effort normal. Cardiovascular system: S1 & S2 heard, RRR. No JVD, murmurs, rubs, gallops or clicks. No pedal edema. Gastrointestinal system: Abdomen is nondistended, soft and nontender. No organomegaly or masses felt. Normal bowel sounds heard. Central nervous system: Alert and oriented. No focal neurological deficits. Extremities: Symmetric  Skin: No rashes, lesions or ulcers Psychiatry:  Mood & affect appropriate.     Data Reviewed: I have personally reviewed following labs and imaging studies  CBC: Recent Labs  Lab 08/25/19 2109  WBC 18.6*  HGB 11.6*  HCT 32.7*  MCV 87.9  PLT 740   Basic Metabolic Panel: Recent Labs  Lab 08/25/19 2109 08/26/19 0744  NA 131* 134*  K 2.8* 3.3*  CL 96* 99  CO2 20* 23  GLUCOSE 163* 111*  BUN 20 23  CREATININE 1.48* 1.84*  CALCIUM 8.9 8.1*   GFR: Estimated Creatinine Clearance: 28.1 mL/min (A) (by C-G formula based on SCr of 1.84 mg/dL (H)). Liver Function Tests: Recent Labs  Lab 08/25/19 2109  AST 24  ALT 16  ALKPHOS 80  BILITOT 1.0  PROT 7.7  ALBUMIN 3.6   Recent Labs  Lab 08/25/19 2109  LIPASE 21   No results for input(s): AMMONIA in the last 168 hours. Coagulation Profile: No results for input(s): INR, PROTIME in the last 168 hours. Cardiac Enzymes: No results for input(s): CKTOTAL, CKMB, CKMBINDEX, TROPONINI in the last 168 hours. BNP (last 3 results) No results for input(s): PROBNP in the last 8760 hours. HbA1C: No results for  input(s): HGBA1C in the last 72 hours. CBG: No results for input(s): GLUCAP in the last 168 hours. Lipid Profile: No results for input(s): CHOL, HDL, LDLCALC, TRIG, CHOLHDL, LDLDIRECT in the last 72 hours. Thyroid Function Tests: No results for input(s): TSH, T4TOTAL, FREET4, T3FREE, THYROIDAB in the last 72 hours. Anemia Panel: No results for input(s): VITAMINB12, FOLATE, FERRITIN, TIBC, IRON, RETICCTPCT in the last 72 hours. Sepsis Labs: Recent Labs  Lab 08/26/19 0557  LATICACIDVEN 1.4    Recent Results (from the past 240 hour(s))  Blood culture (routine x 2)     Status: None (Preliminary result)   Collection Time: 08/26/19  5:57 AM   Specimen: BLOOD  Result Value Ref Range Status   Specimen Description BLOOD RIGHT HAND  Final   Special Requests   Final    BOTTLES DRAWN AEROBIC AND ANAEROBIC Blood Culture adequate volume   Culture   Final    NO GROWTH < 24 HOURS Performed at [facility], [address], [place], [place] [address]    Report Status PENDING  Incomplete  Blood culture (routine x 2)     Status: None (Preliminary result)   Collection Time: 08/26/19  5:57 AM   Specimen: BLOOD  Result Value Ref Range Status   Specimen Description BLOOD LAC  Final   Special Requests   Final    BOTTLES DRAWN AEROBIC AND ANAEROBIC Blood Culture adequate volume   Culture   Final    NO GROWTH < 24 HOURS Performed at [facility], [address] [place], [place] [address]    Report Status PENDING  Incomplete  Urine Culture     Status: Abnormal   Collection Time: 08/26/19  5:57 AM   Specimen: Urine, Random  Result Value Ref Range Status   Specimen Description   Final    URINE, RANDOM Performed at [facility], [address], [place], [place] [address]    Special Requests   Final    NONE Performed at [facility], [address] [place], [place] [address]    Culture MULTIPLE SPECIES PRESENT, SUGGEST RECOLLECTION (A)  Final   Report  Status 08/27/2019 FINAL  Final  SARS Coronavirus 2 by RT PCR (hospital order, performed in [place] hospital lab) Nasopharyngeal Nasopharyngeal Swab     Status: None   Collection Time: 08/26/19  6:41 PM   Specimen: Nasopharyngeal Swab  Result Value Ref Range Status   SARS Coronavirus 2 NEGATIVE NEGATIVE Final    Comment: (NOTE) SARS-CoV-2 target nucleic acids are NOT DETECTED.  The SARS-CoV-2 RNA is generally detectable in upper and lower respiratory specimens during the acute phase of infection. The lowest concentration of SARS-CoV-2 viral copies this assay can detect is 250 copies / mL. A negative result does not preclude SARS-CoV-2 infection and  should not be used as the sole basis for treatment or other patient management decisions.  A negative result may occur with improper specimen collection / handling, submission of specimen other than nasopharyngeal swab, presence of viral mutation(s) within the areas targeted by this assay, and inadequate number of viral copies (<250 copies / mL). A negative result must be combined with clinical observations, patient history, and epidemiological information.  Fact Sheet for Patients:   StrictlyIdeas.no  Fact Sheet for Healthcare Providers: BankingDealers.co.za  This test is not yet approved or  cleared by the [place] FDA and has been authorized for detection and/or diagnosis of SARS-CoV-2 by FDA under an Emergency Use Authorization (EUA).  This EUA will remain in effect (meaning this test can be used) for the duration of the COVID-19 declaration under Section 564(b)(1) of the Act, 21 U.S.C. section 360bbb-3(b)(1), unless the authorization is terminated or revoked sooner.  Performed at [facility], [address] [place], [place] [address]          Radiology Studies: DG Chest 2 View  Result Date: 08/26/2019 CLINICAL DATA:  Fever EXAM: CHEST - 2 VIEW COMPARISON:   08/14/2017 FINDINGS: Slight opacification of the left lung base is likely related to the asymmetric overlying soft tissue. The lungs are well expanded, are symmetric, and are clear. No pneumothorax or pleural effusion. Cardiac size within normal limits. Pulmonary vascularity normal. Interval development of L1 and L2 compression fractures with approximately 50% loss of height. Stable T8 compression fracture with 20-30% loss of height. Asymmetry of the breast tissue is again identified possibly related to partial left breast resection. IMPRESSION: 1. No acute cardiopulmonary disease. 2. Interval development of L1 and L2 compression fractures with approximately 50% loss of height. Electronically Signed   By: Fidela Salisbury MD   On: 08/26/2019 02:52   CT Abdomen Pelvis W Contrast  Result Date: 08/26/2019 CLINICAL DATA:  Abdominal pain, acute, nonlocalized EXAM: CT ABDOMEN AND PELVIS WITH CONTRAST TECHNIQUE: Multidetector CT imaging of the abdomen and pelvis was performed using the standard protocol following bolus administration of intravenous contrast. CONTRAST:  80m OMNIPAQUE IOHEXOL 300 MG/ML  SOLN COMPARISON:  06/27/2019 FINDINGS: Lower chest:  Heavily calcified aorta where covered. Hepatobiliary: No focal liver abnormality.No evidence of biliary obstruction or stone. Pancreas: Unremarkable. Spleen: Unremarkable. Adrenals/Urinary Tract: Negative adrenals. Patchy hypoenhancement of the right renal cortex with right renal expansion and perinephric haziness. No hydronephrosis or abscess. Collapsed urinary bladder. Stomach/Bowel:  No obstruction. No visible bowel inflammation. Vascular/Lymphatic: No acute vascular abnormality. Calcified plaque of the aorta and iliacs. No mass or adenopathy. Reproductive:14 mm left ovarian cystic density, below size threshold requiring sonographic follow-up per consensus guidelines. Tubal ligation clips. Other: No ascites or pneumoperitoneum. Musculoskeletal: No acute abnormalities.  Right femoral fixation. Remote T8, T12, and L1 compression fractures. Based on the numbering scheme, T12 is non rib-bearing. IMPRESSION: Right pyelonephritis.  No hydronephrosis or abscess. Electronically Signed   By: JMonte FantasiaM.D.   On: 08/26/2019 04:59        Scheduled Meds: . busPIRone  30 mg Oral BID  . enoxaparin (LOVENOX) injection  30 mg Subcutaneous Q24H  . ketorolac  30 mg Intravenous Once  . ondansetron (ZOFRAN) IV  4 mg Intravenous Q6H  . potassium chloride  40 mEq Oral BID  . sodium chloride flush  3 mL Intravenous Q12H   Continuous Infusions: . cefTRIAXone (ROCEPHIN)  IV Stopped (08/27/19 0641)     LOS: 1 day    Time spent:  28 minutes    Sharen Hones, MD [facility]   To contact the attending provider between 7A-7P or the covering provider during after hours 7P-7A, please log into the web site [email or web address] and access using universal [place] password for that web site. If you do not have the password, please call the hospital operator.  08/27/2019, 3:45 PM

## 2019-08-27 NOTE — ED Notes (Signed)
Pt given meal tray.

## 2019-08-27 NOTE — Progress Notes (Signed)
Cross Cover Brief Note Nurse reports patient with new cough and wheezing. Current hospitalization patient being treated for sepsis secondary to pyelonephritis with rocephin.   Chest xray IMPRESSION: Chronic scarring in the lingula. Possible worsening of infiltrates in the left lower lobe and right lung base worrisome for developing. Pneumonia. WBC 08/25/2019 18.6,  Added labs, BMP MAG, land lactic acid Lactic acid noted elevated 2.4  - continue IV fluids and trend. Blood culture previiously collected WBC now 13.6 Mag 1.4 and replacement ordered Renal function with some improvement Duonebs, incentive spirometer and flutter valve theraoy ordered as well as adding azithromycin to rocephin for treatment of pneumonia

## 2019-08-27 NOTE — Treatment Plan (Signed)
Pt to unit at this time, pt is AxOx4. Last BM [id number], skin intact and checked with Minette Brine, RN. Purewick placed per patient request. Pt IV fluid 0.9% NS with 20 K. Pt oriented to unit and how to call for help. VSS. [place] intact. Pt on phone with brother during interview. Admission complete. IV site wrapped with Kerlix per request. Pt states she had breast cancer on the left side, fall risk and restricted extremity bracelet placed . Pt states she is independent at home but has been weak since she is sick. Asked about [place] at this time and re educated to her it was given at 1500. Admission completed at this time, dinner tray ordered.

## 2019-08-27 NOTE — Progress Notes (Signed)
RT to patient bedside for PRN breathing treatment due to increased work of breathing. Patient SAT 98% on room air. Diminished bilateral breath sounds with upper airway wheeze noted. Respirations 24, heart rate 100. Patient states she is feeling better after breathing treatment. Will continue to monitor.

## 2019-08-27 NOTE — ED Notes (Signed)
Pt resting in NAD. Remains with lights off and door closed.

## 2019-08-27 NOTE — ED Notes (Signed)
Pt given meal tray and able to eat 100%

## 2019-08-27 NOTE — ED Notes (Signed)
Pt awake and vitals obtained. Introduced self to pt.

## 2019-08-27 NOTE — Progress Notes (Signed)
Patient c/o shob. Lung sounds wheezy. Notified RT and provider. Orders for CXR/DuoNebs. Patient emotional and crying b/c she is afraid breast cancer is returning; always in pain. RN offered emotional support and listening. Patient resting now. Breathaing is even and unlabored after DuoNeb. Will continue to monitor.

## 2019-08-28 DIAGNOSIS — J155 Pneumonia due to Escherichia coli: Secondary | ICD-10-CM

## 2019-08-28 DIAGNOSIS — J189 Pneumonia, unspecified organism: Secondary | ICD-10-CM

## 2019-08-28 LAB — BASIC METABOLIC PANEL
Anion gap: 8 (ref 5–15)
BUN: 15 mg/dL (ref 8–23)
CO2: 22 mmol/L (ref 22–32)
Calcium: 7.8 mg/dL — ABNORMAL LOW (ref 8.9–10.3)
Chloride: 109 mmol/L (ref 98–111)
Creatinine, Ser: 1.39 mg/dL — ABNORMAL HIGH (ref 0.44–1.00)
GFR calc Af Amer: 47 mL/min — ABNORMAL LOW (ref >60)
GFR calc non Af Amer: 41 mL/min — ABNORMAL LOW (ref >60)
Glucose, Bld: 154 mg/dL — ABNORMAL HIGH (ref 70–99)
Potassium: 3.4 mmol/L — ABNORMAL LOW (ref 3.5–5.1)
Sodium: 139 mmol/L (ref 135–145)

## 2019-08-28 LAB — CBC
HCT: 27.4 % — ABNORMAL LOW (ref 36.0–46.0)
Hemoglobin: 9.1 g/dL — ABNORMAL LOW (ref 12.0–15.0)
MCH: 31.2 pg (ref 26.0–34.0)
MCHC: 33.2 g/dL (ref 30.0–36.0)
MCV: 93.8 fL (ref 80.0–100.0)
Platelets: 218 10*3/uL (ref 150–400)
RBC: 2.92 MIL/uL — ABNORMAL LOW (ref 3.87–5.11)
RDW: 14.6 % (ref 11.5–15.5)
WBC: 13.7 10*3/uL — ABNORMAL HIGH (ref 4.0–10.5)
nRBC: 0 % (ref 0.0–0.2)

## 2019-08-28 LAB — LACTIC ACID, PLASMA
Lactic Acid, Venous: 2.4 mmol/L (ref 0.5–1.9)
Lactic Acid, Venous: 1.3 mmol/L (ref 0.5–1.9)

## 2019-08-28 LAB — GLUCOSE, CAPILLARY: Glucose-Capillary: 99 mg/dL (ref 70–99)

## 2019-08-28 LAB — PROCALCITONIN: Procalcitonin: 0.38 ng/mL

## 2019-08-28 LAB — MAGNESIUM: Magnesium: 1.4 mg/dL — ABNORMAL LOW (ref 1.7–2.4)

## 2019-08-28 MED ORDER — MAGNESIUM SULFATE 4 GM/100ML IV SOLN
4.0000 g | Freq: Once | INTRAVENOUS | Status: AC
  Administered 2019-08-28: 4 g via INTRAVENOUS
  Filled 2019-08-28: qty 100

## 2019-08-28 MED ORDER — ENOXAPARIN SODIUM 40 MG/0.4ML ~~LOC~~ SOLN
40.0000 mg | SUBCUTANEOUS | Status: DC
  Administered 2019-08-28: 40 mg via SUBCUTANEOUS
  Filled 2019-08-28: qty 0.4

## 2019-08-28 MED ORDER — POTASSIUM CHLORIDE 10 MEQ/100ML IV SOLN
10.0000 meq | INTRAVENOUS | Status: AC
  Administered 2019-08-28 (×2): 10 meq via INTRAVENOUS
  Filled 2019-08-28 (×2): qty 100

## 2019-08-28 MED ORDER — MAGNESIUM SULFATE 4 GM/100ML IV SOLN
4.0000 g | Freq: Once | INTRAVENOUS | Status: DC
  Filled 2019-08-28: qty 100

## 2019-08-28 MED ORDER — FLUOXETINE HCL 20 MG PO CAPS
40.0000 mg | ORAL_CAPSULE | Freq: Every day | ORAL | Status: DC
  Administered 2019-08-28 – 2019-08-29 (×2): 40 mg via ORAL
  Filled 2019-08-28 (×2): qty 2

## 2019-08-28 NOTE — Progress Notes (Signed)
PROGRESS NOTE    Janet Zuniga  MBT:[id number] DOB: Mar 13, 1955 DOA: 08/26/2019 PCP: Baxter Hire, MD   Chief complaint.  Abdominal pain.  Brief Narrative:   Janet Zuniga a 64 y.o.femalewith history of hypertension, polysubstance use disorder, hepatitis C, ankylosing spondylitis, osteoporosis, anxiety depression, who presents with abdominal pain.  She also had a fever for the last 4 days, associated with frequent urination, back pain.  CT scan showed evidence of right pyelonephritis.  She was started on Rocephin.  8/25.  Patient had hypotension, received 1 L of normal saline bolus. 8/26.  Patient developed short of breath and a cough, chest x-ray showed early pneumonia bilateral lower lobes.  Urine culture showed multiple species.  Currently on Rocephin and Zithromax.   Assessment & Plan:   Active Problems:   History of breast cancer in female   Chronic pain   Depression   Substance induced mood disorder (HCC)   Anxiety   Hypokalemia   Hepatitis C   Hypertension   Ankylosing spondylitis (HCC)   Osteoporosis   Tobacco use disorder   Sepsis secondary to UTI ([place])   Acute pyelonephritis  #1.  Sepsis secondary to pyelonephritis. Patient is more hemodynamically stable today.  Patient develop mild lactic acidosis again last night due to pneumonia.  Lactic acid has normalized again.  Discontinue IV fluids.  2.  Acute pyelonephritis. Condition improving. Continue Rocephin.  3.  Pneumonia at the bilateral lower lobes. Most likely community-acquired.  Continue antibiotics with Rocephin and Zithromax.  4.  Acute kidney injury. Renal function improving.  Recheck BMP tomorrow.  5.  Hypokalemia and hypomagnesemia. Supplement.  DVT prophylaxis: Lovenox  Code Status: Full Family Communication: None Disposition Plan:   Patient came from: home                                                                                                                            Anticipated d/c place: Home  Barriers to d/c OR conditions which need to be met to effect a safe d/c:   Consultants:   None  Procedures: None Antimicrobials: Rocephin. Zithromax    Subjective: Patient develops some short of breath and a cough last night.  Chest x-ray showed bilateral lower lobe pneumonia.  Antibiotic changed to Rocephin and Zithromax. Patient doing well today.  No hypoxemia.  Cough is nonproductive.  No significant short of breath today. No fever or chills.  Objective: Vitals:   08/27/19 1720 08/27/19 2003 08/27/19 2051 08/27/19 2155  BP: 107/73 126/75 101/90   Pulse: 78 90 96   Resp: 20  18   Temp: 97.7 F (36.5 C) 97.6 F (36.4 C) 98.5 F (36.9 C)   TempSrc: Oral Oral    SpO2: 100% 100% 99% 99%  Weight: 73 kg     Height:        Intake/Output Summary (Last 24 hours) at 08/28/2019 1035 Last data filed at 08/28/2019 0900 Gross per 24 hour  Intake  2006.08 ml  Output 2200 ml  Net -193.92 ml   Filed Weights   08/25/19 2102 08/27/19 1720  Weight: 68.9 kg 73 kg    Examination:  General exam: Appears calm and comfortable  Respiratory system: Decreased breathing sounds. Respiratory effort normal. Cardiovascular system: S1 & S2 heard, RRR. No JVD, murmurs, rubs, gallops or clicks. No pedal edema. Gastrointestinal system: Abdomen is nondistended, soft and nontender. No organomegaly or masses felt. Normal bowel sounds heard. Central nervous system: Alert and oriented. No focal neurological deficits. Extremities: Symmetric  Skin: No rashes, lesions or ulcers Psychiatry: Judgement and insight appear normal. Mood & affect appropriate.     Data Reviewed: I have personally reviewed following labs and imaging studies  CBC: Recent Labs  Lab 08/25/19 2109 08/28/19 0042  WBC 18.6* 13.7*  HGB 11.6* 9.1*  HCT 32.7* 27.4*  MCV 87.9 93.8  PLT 190 941   Basic Metabolic Panel: Recent Labs  Lab 08/25/19 2109 08/26/19 0744 08/28/19 0042  NA  131* 134* 139  K 2.8* 3.3* 3.4*  CL 96* 99 109  CO2 20* 23 22  GLUCOSE 163* 111* 154*  BUN _0 CREATININE 1.48* 1.84* 1.39*  CALCIUM 8.9 8.1* 7.8*  MG  --   --  1.4*   GFR: Estimated Creatinine Clearance: 38.4 mL/min (A) (by C-G formula based on SCr of 1.39 mg/dL (H)). Liver Function Tests: Recent Labs  Lab 08/25/19 2109  AST 24  ALT 16  ALKPHOS 80  BILITOT 1.0  PROT 7.7  ALBUMIN 3.6   Recent Labs  Lab 08/25/19 2109  LIPASE 21   No results for input(s): AMMONIA in the last 168 hours. Coagulation Profile: No results for input(s): INR, PROTIME in the last 168 hours. Cardiac Enzymes: No results for input(s): CKTOTAL, CKMB, CKMBINDEX, TROPONINI in the last 168 hours. BNP (last 3 results) No results for input(s): PROBNP in the last 8760 hours. HbA1C: No results for input(s): HGBA1C in the last 72 hours. CBG: Recent Labs  Lab 08/28/19 0747  GLUCAP 99   Lipid Profile: No results for input(s): CHOL, HDL, LDLCALC, TRIG, CHOLHDL, LDLDIRECT in the last 72 hours. Thyroid Function Tests: No results for input(s): TSH, T4TOTAL, FREET4, T3FREE, THYROIDAB in the last 72 hours. Anemia Panel: No results for input(s): VITAMINB12, FOLATE, FERRITIN, TIBC, IRON, RETICCTPCT in the last 72 hours. Sepsis Labs: Recent Labs  Lab 08/26/19 0557 08/28/19 0042 08/28/19 0519  PROCALCITON  --  0.38  --   LATICACIDVEN 1.4 2.4* 1.3    Recent Results (from the past 240 hour(s))  Blood culture (routine x 2)     Status: None (Preliminary result)   Collection Time: 08/26/19  5:57 AM   Specimen: BLOOD  Result Value Ref Range Status   Specimen Description BLOOD RIGHT HAND  Final   Special Requests   Final    BOTTLES DRAWN AEROBIC AND ANAEROBIC Blood Culture adequate volume   Culture   Final    NO GROWTH 2 DAYS Performed at [facility], [address] [place], [place] [address]    Report Status PENDING  Incomplete  Blood culture (routine x 2)     Status: None  (Preliminary result)   Collection Time: 08/26/19  5:57 AM   Specimen: BLOOD  Result Value Ref Range Status   Specimen Description BLOOD LAC  Final   Special Requests   Final    BOTTLES DRAWN AEROBIC AND ANAEROBIC Blood Culture adequate volume   Culture   Final  NO GROWTH 2 DAYS Performed at [facility], [address] [place], [place] [address]    Report Status PENDING  Incomplete  Urine Culture     Status: Abnormal   Collection Time: 08/26/19  5:57 AM   Specimen: Urine, Random  Result Value Ref Range Status   Specimen Description   Final    URINE, RANDOM Performed at [facility], [address], [place], [place] [address]    Special Requests   Final    NONE Performed at [facility], [address] [place], [place] [address]    Culture MULTIPLE SPECIES PRESENT, SUGGEST RECOLLECTION (A)  Final   Report Status 08/27/2019 FINAL  Final  SARS Coronavirus 2 by RT PCR (hospital order, performed in [facility] hospital lab) Nasopharyngeal Nasopharyngeal Swab     Status: None   Collection Time: 08/26/19  6:41 PM   Specimen: Nasopharyngeal Swab  Result Value Ref Range Status   SARS Coronavirus 2 NEGATIVE NEGATIVE Final    Comment: (NOTE) SARS-CoV-2 target nucleic acids are NOT DETECTED.  The SARS-CoV-2 RNA is generally detectable in upper and lower respiratory specimens during the acute phase of infection. The lowest concentration of SARS-CoV-2 viral copies this assay can detect is 250 copies / mL. A negative result does not preclude SARS-CoV-2 infection and should not be used as the sole basis for treatment or other patient management decisions.  A negative result may occur with improper specimen collection / handling, submission of specimen other than nasopharyngeal swab, presence of viral mutation(s) within the areas targeted by this assay, and inadequate number of viral copies (<250 copies / mL). A negative result must be combined with  clinical observations, patient history, and epidemiological information.  Fact Sheet for Patients:   StrictlyIdeas.no  Fact Sheet for Healthcare Providers: BankingDealers.co.za  This test is not yet approved or  cleared by the [place] FDA and has been authorized for detection and/or diagnosis of SARS-CoV-2 by FDA under an Emergency Use Authorization (EUA).  This EUA will remain in effect (meaning this test can be used) for the duration of the COVID-19 declaration under Section 564(b)(1) of the Act, 21 U.S.C. section 360bbb-3(b)(1), unless the authorization is terminated or revoked sooner.  Performed at [facility], [address] [place], [place] [address]          Radiology Studies: DG Chest 1 View  Result Date: 08/27/2019 CLINICAL DATA:  Wheezing and cough. EXAM: CHEST  1 VIEW COMPARISON:  08/26/2019.  08/15/2017. FINDINGS: Heart size is normal. Mediastinal shadows are normal except for mild tortuosity of the aorta. Chronic scarring in the lingula is again demonstrated. This may be slightly more pronounced elsewhere in the left lower lobe and also now the right lung base. This is worrisome for developing pneumonia. No evidence of edema or effusion. IMPRESSION: Chronic scarring in the lingula. Possible worsening of infiltrates in the left lower lobe and right lung base worrisome for developing pneumonia. Electronically Signed   By: Nelson Chimes M.D.   On: 08/27/2019 21:57        Scheduled Meds: . busPIRone  30 mg Oral BID  . enoxaparin (LOVENOX) injection  30 mg Subcutaneous Q24H  . ketorolac  30 mg Intravenous Once  . ondansetron (ZOFRAN) IV  4 mg Intravenous Q6H  . sodium chloride flush  3 mL Intravenous Q12H   Continuous Infusions: . azithromycin 500 mg (08/27/19 2336)  . cefTRIAXone (ROCEPHIN)  IV 1 g (08/28/19 0516)  LOS: 2 days    Time spent: 27 minutes    Sharen Hones, MD [facility]   To contact the attending provider between 7A-7P or the covering provider during after hours 7P-7A, please log into the web site [email or web address] and access using universal [place] password for that web site. If you do not have the password, please call the hospital operator.  08/28/2019, 10:35 AM

## 2019-08-28 NOTE — Progress Notes (Signed)
PT Screen Note  Patient Details Name: Janet Zuniga MRN: [id number] DOB: Jan 26, 1955   Cancelled Treatment:    Reason Eval/Treat Not Completed: PT screened, no needs identified, will sign off Attempted to see pt earlier in the day and she was eating breakfast.  On return later this AM she reports she felt completely fine, near her baseline and had walked 2 loops with nursing w/o incident.  Reviewed some considerations about returning home alone and pt was confident in her safety.  Spoke with nurse afterward who confirms that she not only walked but did very well and showed no overt safety issues or excessive need for UE assist.  Will sign off.  Kreg Shropshire, DPT 08/28/2019, 11:02 AM

## 2019-08-28 NOTE — Progress Notes (Signed)
PHARMACIST - PHYSICIAN COMMUNICATION  CONCERNING:  Enoxaparin (Lovenox) for DVT Prophylaxis    RECOMMENDATION: Patient was prescribed enoxaprin 30mg  q24 hours for VTE prophylaxis due to CrCl<47ml/min.  Filed Weights   08/25/19 2102 08/27/19 1720  Weight: 68.9 kg (152 lb) 73 kg (160 lb 15 oz)    Body mass index is 30.41 kg/m.  Estimated Creatinine Clearance: 38.4 mL/min (A) (by C-G formula based on SCr of 1.39 mg/dL (H)).   Patient is candidate for enoxaparin 40mg  every 24 hours based on CrCl >21ml/min  DESCRIPTION: Pharmacy has adjusted enoxaparin dose per [facility] policy.  Patient is now receiving enoxaparin 40mg  every 24 hours    Sherilyn Banker, PharmD Pharmacy Resident  08/28/2019 11:15 AM

## 2019-08-29 DIAGNOSIS — J189 Pneumonia, unspecified organism: Secondary | ICD-10-CM

## 2019-08-29 LAB — CBC WITH DIFFERENTIAL/PLATELET
Abs Immature Granulocytes: 0.14 10*3/uL — ABNORMAL HIGH (ref 0.00–0.07)
Basophils Absolute: 0.1 10*3/uL (ref 0.0–0.1)
Basophils Relative: 0 %
Eosinophils Absolute: 0.3 10*3/uL (ref 0.0–0.5)
Eosinophils Relative: 3 %
HCT: 27.1 % — ABNORMAL LOW (ref 36.0–46.0)
Hemoglobin: 9.2 g/dL — ABNORMAL LOW (ref 12.0–15.0)
Immature Granulocytes: 1 %
Lymphocytes Relative: 27 %
Lymphs Abs: 3.1 10*3/uL (ref 0.7–4.0)
MCH: 31.2 pg (ref 26.0–34.0)
MCHC: 33.9 g/dL (ref 30.0–36.0)
MCV: 91.9 fL (ref 80.0–100.0)
Monocytes Absolute: 1.2 10*3/uL — ABNORMAL HIGH (ref 0.1–1.0)
Monocytes Relative: 11 %
Neutro Abs: 6.7 10*3/uL (ref 1.7–7.7)
Neutrophils Relative %: 58 %
Platelets: 302 10*3/uL (ref 150–400)
RBC: 2.95 MIL/uL — ABNORMAL LOW (ref 3.87–5.11)
RDW: 15 % (ref 11.5–15.5)
Smear Review: NORMAL
WBC: 11.6 10*3/uL — ABNORMAL HIGH (ref 4.0–10.5)
nRBC: 0 % (ref 0.0–0.2)

## 2019-08-29 LAB — BASIC METABOLIC PANEL
Anion gap: 9 (ref 5–15)
BUN: 13 mg/dL (ref 8–23)
CO2: 25 mmol/L (ref 22–32)
Calcium: 8.3 mg/dL — ABNORMAL LOW (ref 8.9–10.3)
Chloride: 109 mmol/L (ref 98–111)
Creatinine, Ser: 1.16 mg/dL — ABNORMAL HIGH (ref 0.44–1.00)
GFR calc Af Amer: 58 mL/min — ABNORMAL LOW (ref >60)
GFR calc non Af Amer: 50 mL/min — ABNORMAL LOW (ref >60)
Glucose, Bld: 119 mg/dL — ABNORMAL HIGH (ref 70–99)
Potassium: 3.7 mmol/L (ref 3.5–5.1)
Sodium: 143 mmol/L (ref 135–145)

## 2019-08-29 LAB — MAGNESIUM: Magnesium: 1.9 mg/dL (ref 1.7–2.4)

## 2019-08-29 MED ORDER — CEFDINIR 300 MG PO CAPS: 300.0000 mg | ORAL_CAPSULE | Freq: Two times a day (BID) | ORAL | 0 refills | Status: AC

## 2019-08-29 MED ORDER — AZITHROMYCIN 500 MG PO TABS: 500.0000 mg | ORAL_TABLET | Freq: Every day | ORAL | 0 refills | Status: AC

## 2019-08-29 MED ORDER — ALBUTEROL SULFATE HFA 108 (90 BASE) MCG/ACT IN AERS: 2.0000 | INHALATION_SPRAY | Freq: Four times a day (QID) | RESPIRATORY_TRACT | 0 refills | Status: DC | PRN

## 2019-08-29 NOTE — Care Management Important Message (Signed)
Important Message  Patient Details  Name: Janet Zuniga MRN: [id number] Date of Birth: Dec 03, 1955   Medicare Important Message Given:  No  Patient discharged prior to arrival to unit to deliver concurrent Medicare IM.     Dannette Barbara 08/29/2019, 1:47 PM

## 2019-08-29 NOTE — Discharge Summary (Signed)
Physician Discharge Summary  Patient ID: Janet Zuniga MRN: [id number] DOB/AGE: [birth year] 64 y.o.  Admit date: 08/26/2019 Discharge date: 08/29/2019  Admission Diagnoses:  Discharge Diagnoses:  Active Problems:   History of breast cancer in female   Chronic pain   Depression   Substance induced mood disorder (HCC)   Anxiety   Hypokalemia   Hepatitis C   Hypertension   Ankylosing spondylitis (HCC)   Osteoporosis   Tobacco use disorder   Sepsis secondary to UTI [facility])   Acute pyelonephritis   Community acquired bilateral lower lobe pneumonia   Discharged Condition: good  Hospital Course:  Janet Zuniga a 64 y.o.femalewith history of hypertension, polysubstance use disorder, hepatitis C, ankylosing spondylitis, osteoporosis, anxiety depression, who presents with abdominal pain.She also had a fever for the last 4 days, associated with frequent urination, back pain. CT scan showed evidence of right pyelonephritis. She was started on Rocephin.  8/25.Patient had hypotension, received 1 L of normal saline bolus. 8/26.  Patient developed short of breath and a cough, chest x-ray showed early pneumonia bilateral lower lobes.  Urine culture showed multiple species.  Currently on Rocephin and Zithromax. 8/27.  Clinically improved.  Blood culture negative, urine culture has multiple species.   #1.  Sepsis secondary to pyelonephritis. Continue to improve.  2.  Acute pyelonephritis. Condition improved, continue cefdinir for 5 more days.  3.  Pneumonia at the bilateral lower lobes. Most likely community-acquired.    Improved after treatment with Rocephin and Zithromax.  Continue cefdinir and Zithromax.  4.  Acute kidney injury. Renal function much improved.  Creatinine still 1.16, probably need to repeat a BMP in the next week or 2 with the next visit with the primary care physician.  5.  Hypokalemia and hypomagnesemia. Supplemented and normalized.     Consults:  None  Significant Diagnostic Studies: CT ABDOMEN AND PELVIS WITH CONTRAST  TECHNIQUE: Multidetector CT imaging of the abdomen and pelvis was performed using the standard protocol following bolus administration of intravenous contrast.  CONTRAST:  75mL OMNIPAQUE IOHEXOL 300 MG/ML  SOLN  COMPARISON:  06/27/2019  FINDINGS: Lower chest:  Heavily calcified aorta where covered.  Hepatobiliary: No focal liver abnormality.No evidence of biliary obstruction or stone.  Pancreas: Unremarkable.  Spleen: Unremarkable.  Adrenals/Urinary Tract: Negative adrenals. Patchy hypoenhancement of the right renal cortex with right renal expansion and perinephric haziness. No hydronephrosis or abscess. Collapsed urinary bladder.  Stomach/Bowel:  No obstruction. No visible bowel inflammation.  Vascular/Lymphatic: No acute vascular abnormality. Calcified plaque of the aorta and iliacs. No mass or adenopathy.  Reproductive:14 mm left ovarian cystic density, below size threshold requiring sonographic follow-up per consensus guidelines. Tubal ligation clips.  Other: No ascites or pneumoperitoneum.  Musculoskeletal: No acute abnormalities. Right femoral fixation. Remote T8, T12, and L1 compression fractures. Based on the numbering scheme, T12 is non rib-bearing.  IMPRESSION: Right pyelonephritis.  No hydronephrosis or abscess.   Electronically Signed   By: Monte Fantasia M.D.   On: 08/26/2019 04:59  CHEST  1 VIEW  COMPARISON:  08/26/2019.  08/15/2017.  FINDINGS: Heart size is normal. Mediastinal shadows are normal except for mild tortuosity of the aorta. Chronic scarring in the lingula is again demonstrated. This may be slightly more pronounced elsewhere in the left lower lobe and also now the right lung base. This is worrisome for developing pneumonia. No evidence of edema or effusion.  IMPRESSION: Chronic scarring in the lingula. Possible worsening of  infiltrates in the left lower lobe and right  lung base worrisome for developing pneumonia.   Electronically Signed   By: Nelson Chimes M.D.   On: 08/27/2019 21:57    Treatments: antibiotics: ceftriaxone and azithromycin  Discharge Exam: Blood pressure 136/82, pulse 97, temperature 98.5 F (36.9 C), temperature source Oral, resp. rate 18, height 5\' 1"  (1.549 m), weight 73 kg, SpO2 98 %. General appearance: alert and cooperative Resp: clear to auscultation bilaterally Cardio: regular rate and rhythm, S1, S2 normal, no murmur, click, rub or gallop GI: soft, non-tender; bowel sounds normal; no masses,  no organomegaly Extremities: extremities normal, atraumatic, no cyanosis or edema  Disposition: Discharge disposition: 01-Home or Self Care       Discharge Instructions    Diet - low sodium heart healthy   Complete by: As directed    Increase activity slowly   Complete by: As directed      Allergies as of 08/29/2019      Reactions   Tramadol Nausea Only   Penicillins Rash, Other (See Comments)   Has patient had a PCN reaction causing immediate rash, facial/tongue/throat swelling, SOB or lightheadedness with hypotension: Yes Has patient had a PCN reaction causing severe rash involving mucus membranes or skin necrosis: No Has patient had a PCN reaction that required hospitalization No Has patient had a PCN reaction occurring within the last 10 years: No If all of the above answers are "NO", then may proceed with Cephalosporin use.      Medication List    TAKE these medications   albuterol 108 (90 Base) MCG/ACT inhaler Commonly known as: VENTOLIN HFA Inhale 2 puffs into the lungs every 6 (six) hours as needed for wheezing or shortness of breath.   ALPRAZolam 1 MG tablet Commonly known as: XANAX Take 1 tablet (1 mg total) by mouth 2 (two) times daily as needed for anxiety.   azithromycin 500 MG tablet Commonly known as: Zithromax Take 1 tablet (500 mg total) by  mouth daily for 3 days. Take 1 tablet daily for 3 days.   busPIRone 30 MG tablet Commonly known as: BUSPAR Take 1 tablet by mouth 2 (two) times daily.   carisoprodol 350 MG tablet Commonly known as: SOMA Take 1 tablet by mouth 3 (three) times daily as needed for muscle spasms.   cefdinir 300 MG capsule Commonly known as: OMNICEF Take 1 capsule (300 mg total) by mouth 2 (two) times daily for 5 days.   etanercept 50 MG/ML injection Commonly known as: ENBREL Inject 1 mL into the skin once a week.   FLUoxetine 40 MG capsule Commonly known as: PROZAC Take 1 capsule (40 mg total) by mouth daily.   ibuprofen 800 MG tablet Commonly known as: ADVIL Take 1 tablet by mouth every 8 (eight) hours as needed for pain.       Follow-up Information    Baxter Hire, MD Follow up in 1 week(s).   Specialty: Internal Medicine Contact information: [place] [address] [phone]               Signed: Sharen Hones 08/29/2019, 9:15 AM

## 2019-08-31 LAB — CULTURE, BLOOD (ROUTINE X 2)
Culture: NO GROWTH
Culture: NO GROWTH
Special Requests: ADEQUATE
Special Requests: ADEQUATE

## 2019-09-04 ENCOUNTER — Encounter: Payer: Self-pay | Admitting: Gastroenterology

## 2019-09-12 ENCOUNTER — Ambulatory Visit: Admit: 2019-09-12 | Payer: Medicare Other | Admitting: Gastroenterology

## 2019-09-12 SURGERY — COLONOSCOPY WITH PROPOFOL
Anesthesia: Choice

## 2020-04-07 DIAGNOSIS — I700 Atherosclerosis of aorta: Secondary | ICD-10-CM | POA: Insufficient documentation

## 2020-04-30 ENCOUNTER — Observation Stay
Admission: EM | Admit: 2020-04-30 | Discharge: 2020-05-01 | Disposition: A | Discharge status: Home / Self Care | Payer: Medicare Other | Attending: Internal Medicine | Admitting: Internal Medicine

## 2020-04-30 ENCOUNTER — Encounter: Payer: Self-pay | Admitting: Emergency Medicine

## 2020-04-30 ENCOUNTER — Other Ambulatory Visit: Payer: Self-pay

## 2020-04-30 DIAGNOSIS — R531 Weakness: Secondary | ICD-10-CM | POA: Diagnosis not present

## 2020-04-30 DIAGNOSIS — Z20822 Contact with and (suspected) exposure to covid-19: Secondary | ICD-10-CM | POA: Diagnosis not present

## 2020-04-30 DIAGNOSIS — I10 Essential (primary) hypertension: Secondary | ICD-10-CM | POA: Insufficient documentation

## 2020-04-30 DIAGNOSIS — Z79899 Other long term (current) drug therapy: Secondary | ICD-10-CM | POA: Diagnosis not present

## 2020-04-30 DIAGNOSIS — E876 Hypokalemia: Principal | ICD-10-CM | POA: Diagnosis present

## 2020-04-30 DIAGNOSIS — F419 Anxiety disorder, unspecified: Secondary | ICD-10-CM

## 2020-04-30 DIAGNOSIS — Z853 Personal history of malignant neoplasm of breast: Secondary | ICD-10-CM | POA: Insufficient documentation

## 2020-04-30 DIAGNOSIS — F17210 Nicotine dependence, cigarettes, uncomplicated: Secondary | ICD-10-CM | POA: Insufficient documentation

## 2020-04-30 DIAGNOSIS — F32A Depression, unspecified: Secondary | ICD-10-CM

## 2020-04-30 DIAGNOSIS — K529 Noninfective gastroenteritis and colitis, unspecified: Secondary | ICD-10-CM | POA: Insufficient documentation

## 2020-04-30 LAB — CBC
HCT: 33.1 % — ABNORMAL LOW (ref 36.0–46.0)
Hemoglobin: 11.6 g/dL — ABNORMAL LOW (ref 12.0–15.0)
MCH: 31.8 pg (ref 26.0–34.0)
MCHC: 35 g/dL (ref 30.0–36.0)
MCV: 90.7 fL (ref 80.0–100.0)
Platelets: 218 10*3/uL (ref 150–400)
RBC: 3.65 MIL/uL — ABNORMAL LOW (ref 3.87–5.11)
RDW: 13.7 % (ref 11.5–15.5)
WBC: 9.4 10*3/uL (ref 4.0–10.5)
nRBC: 0 % (ref 0.0–0.2)

## 2020-04-30 LAB — MAGNESIUM: Magnesium: 1.9 mg/dL (ref 1.7–2.4)

## 2020-04-30 LAB — HIV ANTIBODY (ROUTINE TESTING W REFLEX): HIV Screen 4th Generation wRfx: NONREACTIVE

## 2020-04-30 LAB — BASIC METABOLIC PANEL
Anion gap: 15 (ref 5–15)
BUN: 17 mg/dL (ref 8–23)
CO2: 19 mmol/L — ABNORMAL LOW (ref 22–32)
Calcium: 8.7 mg/dL — ABNORMAL LOW (ref 8.9–10.3)
Chloride: 101 mmol/L (ref 98–111)
Creatinine, Ser: 1.09 mg/dL — ABNORMAL HIGH (ref 0.44–1.00)
GFR, Estimated: 57 mL/min — ABNORMAL LOW (ref >60)
Glucose, Bld: 97 mg/dL (ref 70–99)
Potassium: 2.3 mmol/L — CL (ref 3.5–5.1)
Sodium: 135 mmol/L (ref 135–145)

## 2020-04-30 MED ORDER — CARISOPRODOL 350 MG PO TABS
350.0000 mg | ORAL_TABLET | Freq: Three times a day (TID) | ORAL | Status: DC | PRN
  Administered 2020-04-30 – 2020-05-01 (×2): 350 mg via ORAL
  Filled 2020-04-30 (×2): qty 1

## 2020-04-30 MED ORDER — ENOXAPARIN SODIUM 40 MG/0.4ML IJ SOSY
40.0000 mg | PREFILLED_SYRINGE | INTRAMUSCULAR | Status: DC
  Administered 2020-04-30: 40 mg via SUBCUTANEOUS
  Filled 2020-04-30: qty 0.4

## 2020-04-30 MED ORDER — ACETAMINOPHEN 650 MG RE SUPP: 650.0000 mg | Freq: Four times a day (QID) | RECTAL | Status: DC | PRN

## 2020-04-30 MED ORDER — BUSPIRONE HCL 15 MG PO TABS
30.0000 mg | ORAL_TABLET | Freq: Two times a day (BID) | ORAL | Status: DC
  Administered 2020-04-30: 30 mg via ORAL
  Filled 2020-04-30 (×3): qty 2

## 2020-04-30 MED ORDER — LACTATED RINGERS IV BOLUS
1000.0000 mL | Freq: Once | INTRAVENOUS | Status: AC
  Administered 2020-04-30: 1000 mL via INTRAVENOUS

## 2020-04-30 MED ORDER — POTASSIUM CHLORIDE 10 MEQ/100ML IV SOLN
10.0000 meq | INTRAVENOUS | Status: DC
  Administered 2020-04-30: 10 meq via INTRAVENOUS
  Filled 2020-04-30: qty 100

## 2020-04-30 MED ORDER — CLONAZEPAM 1 MG PO TABS
1.0000 mg | ORAL_TABLET | Freq: Three times a day (TID) | ORAL | Status: DC | PRN
  Administered 2020-04-30 – 2020-05-01 (×2): 1 mg via ORAL
  Filled 2020-04-30 (×2): qty 1

## 2020-04-30 MED ORDER — TRAZODONE HCL 50 MG PO TABS: 25.0000 mg | ORAL_TABLET | Freq: Every evening | ORAL | Status: DC | PRN

## 2020-04-30 MED ORDER — ACETAMINOPHEN 325 MG PO TABS: 650.0000 mg | ORAL_TABLET | Freq: Four times a day (QID) | ORAL | Status: DC | PRN

## 2020-04-30 MED ORDER — POTASSIUM CHLORIDE IN NACL 40-0.9 MEQ/L-% IV SOLN
INTRAVENOUS | Status: DC
  Filled 2020-04-30 (×6): qty 1000

## 2020-04-30 MED ORDER — POTASSIUM CHLORIDE 10 MEQ/100ML IV SOLN
10.0000 meq | INTRAVENOUS | Status: AC
  Administered 2020-04-30 (×2): 10 meq via INTRAVENOUS
  Filled 2020-04-30 (×2): qty 100

## 2020-04-30 MED ORDER — MAGNESIUM SULFATE 2 GM/50ML IV SOLN
2.0000 g | Freq: Once | INTRAVENOUS | Status: AC
  Administered 2020-04-30: 2 g via INTRAVENOUS
  Filled 2020-04-30: qty 50

## 2020-04-30 MED ORDER — ONDANSETRON HCL 4 MG PO TABS: 4.0000 mg | ORAL_TABLET | Freq: Four times a day (QID) | ORAL | Status: DC | PRN

## 2020-04-30 MED ORDER — ONDANSETRON HCL 4 MG/2ML IJ SOLN: 4.0000 mg | Freq: Four times a day (QID) | INTRAMUSCULAR | Status: DC | PRN

## 2020-04-30 MED ORDER — POTASSIUM CHLORIDE CRYS ER 20 MEQ PO TBCR
40.0000 meq | EXTENDED_RELEASE_TABLET | Freq: Once | ORAL | Status: AC
  Administered 2020-04-30: 40 meq via ORAL
  Filled 2020-04-30: qty 2

## 2020-04-30 MED ORDER — FLUOXETINE HCL 20 MG PO CAPS
40.0000 mg | ORAL_CAPSULE | Freq: Every day | ORAL | Status: DC
  Administered 2020-05-01: 40 mg via ORAL
  Filled 2020-04-30: qty 2

## 2020-04-30 NOTE — H&P (Signed)
[place]   PATIENT NAME: Janet Zuniga    MR#:  [id number]  DATE OF BIRTH:  [birth year]  DATE OF ADMISSION:  04/30/2020  PRIMARY CARE PHYSICIAN: Baxter Hire, MD   Patient is coming from: Home  REQUESTING/REFERRING PHYSICIAN: Blake Divine, MD  CHIEF COMPLAINT:   Chief Complaint  Patient presents with  . Weakness    HISTORY OF PRESENT ILLNESS:  Janet Zuniga is a 65 y.o. Caucasian female with medical history significant for ankylosing spondylitis, anxiety/depression, hypertension, osteoarthritis and hypertension, who presented to emergency room with acute onset of generalized weakness after having intractable nausea and vomiting with diarrhea that started about a week ago until 2 days ago.  She denied any fever or chills.  No chest pain or dyspnea or cough or wheezing.  She denied any abdominal pain.  No dysuria, oliguria or hematuria or flank pain.  She has been having very poor appetite for the last couple of days.  No dysuria, oliguria or hematuria or flank pain.  ED Course: Upon presentation to the ER vital signs were within normal.  Labs revealed significant hypokalemia of 2.3 and a CO2 of 19 with anion gap of 15.  CBC showed anemia better than previous levels in August 2021. EKG as reviewed by me : EKG showed normal sinus rhythm with rate of 80 with low voltage QRS with poor R wave progression. I The patient was given 1 L bolus of IV lactated Ringer, 10 mill Cabbell of IV potassium chloride and 40 mEq p.o. KCl.  2 g of IV magnesium sulfate.  She will be admitted to an observation medical monitored bed for further evaluation and management. PAST MEDICAL HISTORY:   Past Medical History:  Diagnosis Date  . Ankylosing spondylitis ([place])   . Anxiety   . Arthritis   . Blood in stool   . Breast cancer ([place]) 2010   Left breast- chemo/radiation  . Chronic low back pain   . Chronic pain   . Depression   . Headache   . Hepatitis C 2007  . Hip fracture ([place])   .  History of blood transfusion   . Hypertension   . Insomnia   . OA (osteoarthritis)   . Osteoporosis   . Personal history of chemotherapy   . Personal history of radiation therapy   . Spinal stenosis of lumbar region   . Spondyloarthritis     PAST SURGICAL HISTORY:   Past Surgical History:  Procedure Laterality Date  . BREAST BIOPSY Right 1983   neg  . BREAST EXCISIONAL BIOPSY Left 2010   triple neg  . BREAST LUMPECTOMY Left 2010  . FEMUR FRACTURE SURGERY    . FOOT SURGERY  2000 and 2003  . INTRAMEDULLARY (IM) NAIL INTERTROCHANTERIC Right 01/12/2015   Procedure: INTRAMEDULLARY (IM) NAIL INTERTROCHANTRIC;  Surgeon: Earnestine Leys, MD;  Location: [facility] ORS;  Service: Orthopedics;  Laterality: Right;  . KNEE SURGERY Left 2004  . TUBAL LIGATION      SOCIAL HISTORY:   Social History   Tobacco Use  . Smoking status: Current Every Day Smoker    Packs/day: 0.20    Years: 25.00    Pack years: 5.00    Types: Cigarettes  . Smokeless tobacco: Never Used  Substance Use Topics  . Alcohol use: Yes    Comment: occasionally a glass of wine cooler    FAMILY HISTORY:   Family History  Problem Relation Age of Onset  . Arthritis Mother   .  Stroke Mother   . Heart disease Mother   . Hypertension Mother   . Hyperlipidemia Mother   . Depression Mother   . Seizures Mother   . Osteoporosis Mother   . Hypertension Father   . Hypertension Brother   . Arthritis Brother   . Depression Brother   . Diabetes Brother   . Hyperlipidemia Brother   . Heart disease Maternal Aunt   . Breast cancer Maternal Aunt   . Seizures Sister   . Stroke Sister   . COPD Neg Hx   . Cancer Neg Hx     DRUG ALLERGIES:   Allergies  Allergen Reactions  . Tramadol Nausea Only  . Penicillins Rash and Other (See Comments)    Has patient had a PCN reaction causing immediate rash, facial/tongue/throat swelling, SOB or lightheadedness with hypotension: Yes Has patient had a PCN reaction causing severe rash  involving mucus membranes or skin necrosis: No Has patient had a PCN reaction that required hospitalization No Has patient had a PCN reaction occurring within the last 10 years: No If all of the above answers are "NO", then may proceed with Cephalosporin use.     REVIEW OF SYSTEMS:   ROS As per history of present illness. All pertinent systems were reviewed above. Constitutional, HEENT, cardiovascular, respiratory, GI, GU, musculoskeletal, neuro, psychiatric, endocrine, integumentary and hematologic systems were reviewed and are otherwise negative/unremarkable except for positive findings mentioned above in the HPI.   MEDICATIONS AT HOME:   Prior to Admission medications   Medication Sig Start Date End Date Taking? Authorizing Provider  busPIRone (BUSPAR) 30 MG tablet Take 1 tablet by mouth 2 (two) times daily. 07/18/17  Yes [provider]  carisoprodol (SOMA) 350 MG tablet Take 1 tablet by mouth 3 (three) times daily as needed for muscle spasms. 01/25/18  Yes [provider]  clonazePAM (KLONOPIN) 1 MG tablet Take 1 mg by mouth 3 (three) times daily as needed. 04/10/20  Yes [provider]  etanercept (ENBREL) 50 MG/ML injection Inject 1 mL into the skin once a week. 07/10/18  Yes [provider]  FLUoxetine (PROZAC) 40 MG capsule Take 1 capsule (40 mg total) by mouth daily. 04/25/17  Yes Volney American, PA-C  albuterol (VENTOLIN HFA) 108 (90 Base) MCG/ACT inhaler Inhale 2 puffs into the lungs every 6 (six) hours as needed for wheezing or shortness of breath. Patient not taking: No sig reported 08/29/19   Sharen Hones, MD  ALPRAZolam Duanne Moron) 1 MG tablet Take 1 tablet (1 mg total) by mouth 2 (two) times daily as needed for anxiety. Patient not taking: No sig reported 04/18/17   Volney American, PA-C  ibuprofen (ADVIL,MOTRIN) 800 MG tablet Take 1 tablet by mouth every 8 (eight) hours as needed for pain. Patient not taking: No sig reported 01/16/18    [provider]  traZODone (DESYREL) 100 MG tablet Take 200 mg by mouth at bedtime. Patient not taking: No sig reported 04/29/20   [provider]      VITAL SIGNS:  Blood pressure (!) 150/83, pulse 60, temperature 98.3 F (36.8 C), temperature source Oral, resp. rate 20, height 5\' 1"  (1.549 m), weight 66.7 kg, SpO2 96 %.  PHYSICAL EXAMINATION:  Physical Exam  GENERAL:  65 y.o.-year-old patient sitting in the bed with no acute distress.  EYES: Pupils equal, round, reactive to light and accommodation. No scleral icterus. Extraocular muscles intact.  HEENT: Head atraumatic, normocephalic. Oropharynx with slightly dry mucous membrane and tongue  and nasopharynx clear.  NECK:  Supple, no jugular venous distention. No thyroid enlargement, no tenderness.  LUNGS: Normal breath sounds bilaterally, no wheezing, rales,rhonchi or crepitation. No use of accessory muscles of respiration.  CARDIOVASCULAR: Regular rate and rhythm, S1, S2 normal. No murmurs, rubs, or gallops.  ABDOMEN: Soft, nondistended, nontender. Bowel sounds present. No organomegaly or mass.  EXTREMITIES: No pedal edema, cyanosis, or clubbing.  NEUROLOGIC: Cranial nerves II through XII are intact. Muscle strength 5/5 in all extremities. Sensation intact. Gait not checked.  PSYCHIATRIC: The patient is alert and oriented x 3.  Normal affect and good eye contact. SKIN: No obvious rash, lesion, or ulcer.   LABORATORY PANEL:   CBC Recent Labs  Lab 04/30/20 1408  WBC 9.4  HGB 11.6*  HCT 33.1*  PLT 218   ------------------------------------------------------------------------------------------------------------------  Chemistries  Recent Labs  Lab 04/30/20 1408  NA 135  K 2.3*  CL 101  CO2 19*  GLUCOSE 97  BUN 17  CREATININE 1.09*  CALCIUM 8.7*   ------------------------------------------------------------------------------------------------------------------  Cardiac Enzymes No results for  input(s): TROPONINI in the last 168 hours. ------------------------------------------------------------------------------------------------------------------  RADIOLOGY:  No results found.    IMPRESSION AND PLAN:  Active Problems:   Hypokalemia  1.  Acute gastroenteritis with subsequent volume depletion and dehydration. - The patient will be placed in observation in a medical monitored bed. - We will place her on IV hydration with normal saline with electrolyte supplementation. - We will place her on IV PPI therapy and as needed antiemetics. - We will keep her on clear fluids and and advance as tolerated.  2.  Severe hypokalemia. - Potassium will be aggressively replaced and magnesium level will be checked.  3.  Generalized weakness secondary to #2 and #1. - Management as above. - Physical therapy consult to be obtained.  4.  Anxiety and depression. - We will continue BuSpar, Klonopin, as needed Xanax, Prozac and trazodone.  DVT prophylaxis: Lovenox. Code Status: full code.  Family Communication:  The plan of care was discussed in details with the patient (who requested no other family members to be notified.). I answered all questions. The patient agreed to proceed with the above mentioned plan. Further management will depend upon hospital course. Disposition Plan: Back to previous home environment Consults called: none. All the records are reviewed and case discussed with ED provider.  Status is: Observation  The patient remains OBS appropriate and will d/c before 2 midnights.  Dispo: The patient is from: Home              Anticipated d/c is to: Home              Patient currently is not medically stable to d/c.   Difficult to place patient No   TOTAL TIME TAKING CARE OF THIS PATIENT:50 minutes.    Christel Mormon M.D on 04/30/2020 at 4:14 PM  [facility]   From 7 PM-7 AM, contact night-coverage [email or web address]  CC: Primary care physician; Baxter Hire,  MD

## 2020-04-30 NOTE — ED Notes (Signed)
All extra blood specimens and COVID specimen labeled at bedside, and sent to lab at this time.

## 2020-04-30 NOTE — ED Provider Notes (Signed)
[facility] Provider Note   ____________________________________________   Event Date/Time   First MD Initiated Contact with Patient 04/30/20 1459     (approximate)  I have reviewed the triage vital signs and the nursing notes.   HISTORY  Chief Complaint Weakness    HPI Janet Zuniga is a 65 y.o. female with past medical history of hypertension, polysubstance abuse, hepatitis C, and ankylosing spondylitis who presents to the ED complaining of generalized weakness.  Patient reports that she started having nausea, vomiting, and diarrhea 1 week ago which persisted until 2 days ago.  She now states that the nausea is better and she is not having any more diarrhea, but she feels extremely weak.  She has not had any fevers denies any cough, chest pain, or shortness of breath.  She continues to have a poor appetite and has not had much to eat or drink in the past couple of days.        Past Medical History:  Diagnosis Date  . Ankylosing spondylitis ([place])   . Anxiety   . Arthritis   . Blood in stool   . Breast cancer ([place]) 2010   Left breast- chemo/radiation  . Chronic low back pain   . Chronic pain   . Depression   . Headache   . Hepatitis C 2007  . Hip fracture ([place])   . History of blood transfusion   . Hypertension   . Insomnia   . OA (osteoarthritis)   . Osteoporosis   . Personal history of chemotherapy   . Personal history of radiation therapy   . Spinal stenosis of lumbar region   . Spondyloarthritis     Patient Active Problem List   Diagnosis Date Noted  . Community acquired bilateral lower lobe pneumonia 08/28/2019  . Acute pyelonephritis 08/27/2019  . Sepsis secondary to UTI ([place]) 08/26/2019  . Tobacco use disorder 10/17/2018  . 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 Depression, major, recurrent, moderate ([place]) 02/03/2018  .  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 Degenerative disc disease, lumbar 09/20/2016  . Shoulder joint pain 09/18/2016  . Hypertension   . Ankylosing spondylitis ([place])   . OA (osteoarthritis)   . Osteoporosis   . Malignant neoplasm of left female breast ([place]) 09/12/2015  . Cocaine abuse ([place]) 08/18/2015  . Alcohol abuse 08/03/2015  . Chronic use of opiate drugs therapeutic purposes 05/19/2015  . Leg reflex sympathetic dystrophy, right 05/19/2015  . Fracture of shaft of femur ([place]) 01/12/2015  . Anxiety 01/12/2015  . Hypokalemia 01/12/2015  . Anxiety and depression 12/24/2014  . Chronic bilateral low back pain with bilateral sciatica 12/24/2014  . Substance induced mood disorder ([place]) 10/27/2014  . Chronic pain in right foot 10/20/2014  . Acute bronchitis 10/20/2014  . Insomnia 06/05/2014  . Chronic pain syndrome 05/15/2014  . Raynaud's phenomenon without gangrene 05/15/2014  . Spinal stenosis of lumbar region 05/15/2014  . 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 Generalized anxiety disorder 05/04/2014  . Hepatitis C 03/12/2014  . Midline low back pain without sciatica 03/12/2014  . History of breast cancer in female 12/23/2013  . Chronic pain 12/23/2013  . Ankylosing spondylitis of lumbosacral region ([place]) 12/23/2013  . Depression 12/23/2013  . Osteoarthritis 10/05/2011    Past Surgical History:  Procedure Laterality Date  . BREAST BIOPSY Right 1983   neg  . BREAST EXCISIONAL BIOPSY Left 2010   triple neg  .  BREAST LUMPECTOMY Left 2010  . FEMUR FRACTURE SURGERY    . FOOT SURGERY  2000 and 2003  . INTRAMEDULLARY (IM) NAIL INTERTROCHANTERIC Right 01/12/2015   Procedure: INTRAMEDULLARY (IM) NAIL INTERTROCHANTRIC;  Surgeon: Earnestine Leys, MD;  Location: [facility] ORS;  Service: Orthopedics;  Laterality: Right;  . KNEE SURGERY Left 2004  . TUBAL LIGATION      Prior to Admission medications   Medication Sig Start Date End Date Taking? Authorizing Provider  albuterol  (VENTOLIN HFA) 108 (90 Base) MCG/ACT inhaler Inhale 2 puffs into the lungs every 6 (six) hours as needed for wheezing or shortness of breath. 08/29/19   Sharen Hones, MD  ALPRAZolam Duanne Moron) 1 MG tablet Take 1 tablet (1 mg total) by mouth 2 (two) times daily as needed for anxiety. 04/18/17   Volney American, PA-C  busPIRone (BUSPAR) 30 MG tablet Take 1 tablet by mouth 2 (two) times daily. 07/18/17   [provider]  carisoprodol (SOMA) 350 MG tablet Take 1 tablet by mouth 3 (three) times daily as needed for muscle spasms. 01/25/18   [provider]  etanercept (ENBREL) 50 MG/ML injection Inject 1 mL into the skin once a week. 07/10/18   [provider]  FLUoxetine (PROZAC) 40 MG capsule Take 1 capsule (40 mg total) by mouth daily. 04/25/17   Volney American, PA-C  ibuprofen (ADVIL,MOTRIN) 800 MG tablet Take 1 tablet by mouth every 8 (eight) hours as needed for pain. 01/16/18   [provider]    Allergies Tramadol and Penicillins  Family History  Problem Relation Age of Onset  . Arthritis Mother   . Stroke Mother   . Heart disease Mother   . Hypertension Mother   . Hyperlipidemia Mother   . Depression Mother   . Seizures Mother   . Osteoporosis Mother   . Hypertension Father   . Hypertension Brother   . Arthritis Brother   . Depression Brother   . Diabetes Brother   . Hyperlipidemia Brother   . Heart disease Maternal Aunt   . Breast cancer Maternal Aunt   . Seizures Sister   . Stroke Sister   . COPD Neg Hx   . Cancer Neg Hx     Social History Social History   Tobacco Use  . Smoking status: Current Every Day Smoker    Packs/day: 0.20    Years: 25.00    Pack years: 5.00    Types: Cigarettes  . Smokeless tobacco: Never Used  Vaping Use  . Vaping Use: Never used  Substance Use Topics  . Alcohol use: Yes    Comment: occasionally a glass of wine cooler  . Drug use: Yes    Types: Marijuana    Comment: occasionally     Review of  Systems  Constitutional: No fever/chills.  Positive for generalized weakness. Eyes: No visual changes. ENT: No sore throat. Cardiovascular: Denies chest pain. Respiratory: Denies shortness of breath. Gastrointestinal: No abdominal pain.  Positive for nausea, vomiting, and diarrhea.  No constipation. Genitourinary: Negative for dysuria. Musculoskeletal: Negative for back pain. Skin: Negative for rash. Neurological: Negative for headaches, focal weakness or numbness.  ____________________________________________   PHYSICAL EXAM:  VITAL SIGNS: ED Triage Vitals  Enc Vitals Group     BP 04/30/20 1406 123/83     Pulse Rate 04/30/20 1406 79     Resp 04/30/20 1406 20     Temp 04/30/20 1406 98.3 F (36.8 C)     Temp Source 04/30/20 1406 Oral  SpO2 04/30/20 1406 99 %     Weight 04/30/20 1407 147 lb (66.7 kg)     Height 04/30/20 1407 5\' 1"  (1.549 m)     Head Circumference --      Peak Flow --      Pain Score 04/30/20 1407 8     Pain Loc --      Pain Edu? --      Excl. in [place]? --     Constitutional: Alert and oriented. Eyes: Conjunctivae are normal. Head: Atraumatic. Nose: No congestion/rhinnorhea. Mouth/Throat: Mucous membranes are dry. Neck: Normal ROM Cardiovascular: Normal rate, regular rhythm. Grossly normal heart sounds. Respiratory: Normal respiratory effort.  No retractions. Lungs CTAB. Gastrointestinal: Soft and nontender. No distention. Genitourinary: deferred Musculoskeletal: No lower extremity tenderness nor edema. Neurologic:  Normal speech and language. No gross focal neurologic deficits are appreciated. Skin:  Skin is warm, dry and intact. No rash noted. Psychiatric: Mood and affect are normal. Speech and behavior are normal.  ____________________________________________   LABS (all labs ordered are listed, but only abnormal results are displayed)  Labs Reviewed  BASIC METABOLIC PANEL - Abnormal; Notable for the following components:      Result Value    Potassium 2.3 (*)    CO2 19 (*)    Creatinine, Ser 1.09 (*)    Calcium 8.7 (*)    GFR, Estimated 57 (*)    All other components within normal limits  CBC - Abnormal; Notable for the following components:   RBC 3.65 (*)    Hemoglobin 11.6 (*)    HCT 33.1 (*)    All other components within normal limits  SARS CORONAVIRUS 2 (TAT 6-24 HRS)  URINALYSIS, COMPLETE (UACMP) WITH MICROSCOPIC  MAGNESIUM  CBG MONITORING, ED   ____________________________________________  EKG  ED ECG REPORT I, Blake Divine, the attending physician, personally viewed and interpreted this ECG.   Date: 04/30/2020  EKG Time: 14:11  Rate: 80  Rhythm: normal sinus rhythm  Axis: Normal  Intervals:none  ST&T Change: Nonspecific T wave changes   PROCEDURES  Procedure(s) performed (including Critical Care):  .Critical Care Performed by: Blake Divine, MD Authorized by: Blake Divine, MD   Critical care provider statement:    Critical care time (minutes):  45   Critical care time was exclusive of:  Separately billable procedures and treating other patients and teaching time   Critical care was necessary to treat or prevent imminent or life-threatening deterioration of the following conditions:  Metabolic crisis   Critical care was time spent personally by me on the following activities:  Discussions with consultants, evaluation of patient's response to treatment, examination of patient, ordering and performing treatments and interventions, ordering and review of laboratory studies, ordering and review of radiographic studies, pulse oximetry, re-evaluation of patient's condition, obtaining history from patient or surrogate and review of old charts   I assumed direction of critical care for this patient from another provider in my specialty: no     Care discussed with: admitting provider       ____________________________________________   INITIAL IMPRESSION / ASSESSMENT AND PLAN / ED COURSE        65 year old female with past medical history of hypertension, polysubstance abuse, hepatitis C, and ankylosing spondylitis who presents to the ED with generalized weakness after 5 days of nausea, vomiting, and diarrhea.  She appears dehydrated and labs remarkable for severe hypokalemia with potassium of 2.3.  I am concerned nonspecific T wave changes on her EKG could be related  to hypokalemia and we will aggressively replete with IV and p.o. potassium.  We will also provide supplemental magnesium.  Case discussed with hospitalist for admission.      ____________________________________________   FINAL CLINICAL IMPRESSION(S) / ED DIAGNOSES  Final diagnoses:  Hypokalemia  Gastroenteritis  Generalized weakness     ED Discharge Orders    None       Note:  This document was prepared using Dragon voice recognition software and may include unintentional dictation errors.   Blake Divine, MD 04/30/20 1537

## 2020-04-30 NOTE — ED Notes (Signed)
RN aware bed assigned ?

## 2020-04-30 NOTE — ED Triage Notes (Signed)
Pt reports had NV for a week then got over that but has been extremely weak. Pt denies pain, SOB or other symptoms but reports has no energy at all.

## 2020-04-30 NOTE — Progress Notes (Signed)
Pt arrival to unit. Oriented to room and unit. Whiteboard updated, admission assessment complete. IVF infusing. Pt denies pain or discomfort at this time. Monitoring.

## 2020-04-30 NOTE — ED Triage Notes (Signed)
Pt in via EMS from home with c/o NV that started Friday last week, cleared Wednesday but still feels weak. Pt states she thinks she is dehydrated. No episodes of V or , GHR D since Wed. 135/75, HR 67, FSBS 102, Temp 98.1

## 2020-04-30 NOTE — ED Notes (Signed)
Patient transported to inpatient unit by this RN. 

## 2020-04-30 NOTE — ED Notes (Signed)
See triage note. Patient reports continued weakness. Patient reports drowsiness at this time. Patient is alert, oriented x4, but does appear drowsy.

## 2020-05-01 ENCOUNTER — Encounter: Payer: Self-pay | Admitting: Family Medicine

## 2020-05-01 DIAGNOSIS — E876 Hypokalemia: Secondary | ICD-10-CM

## 2020-05-01 LAB — CBC
HCT: 30.4 % — ABNORMAL LOW (ref 36.0–46.0)
Hemoglobin: 10.4 g/dL — ABNORMAL LOW (ref 12.0–15.0)
MCH: 31.7 pg (ref 26.0–34.0)
MCHC: 34.2 g/dL (ref 30.0–36.0)
MCV: 92.7 fL (ref 80.0–100.0)
Platelets: 233 10*3/uL (ref 150–400)
RBC: 3.28 MIL/uL — ABNORMAL LOW (ref 3.87–5.11)
RDW: 14 % (ref 11.5–15.5)
WBC: 9.4 10*3/uL (ref 4.0–10.5)
nRBC: 0 % (ref 0.0–0.2)

## 2020-05-01 LAB — BASIC METABOLIC PANEL
Anion gap: 9 (ref 5–15)
BUN: 14 mg/dL (ref 8–23)
CO2: 21 mmol/L — ABNORMAL LOW (ref 22–32)
Calcium: 8.2 mg/dL — ABNORMAL LOW (ref 8.9–10.3)
Chloride: 109 mmol/L (ref 98–111)
Creatinine, Ser: 0.73 mg/dL (ref 0.44–1.00)
GFR, Estimated: >60 mL/min (ref >60)
Glucose, Bld: 99 mg/dL (ref 70–99)
Potassium: 3.4 mmol/L — ABNORMAL LOW (ref 3.5–5.1)
Sodium: 139 mmol/L (ref 135–145)

## 2020-05-01 LAB — SARS CORONAVIRUS 2 (TAT 6-24 HRS): SARS Coronavirus 2: NEGATIVE

## 2020-05-01 NOTE — Plan of Care (Signed)

## 2020-05-01 NOTE — Evaluation (Signed)
Physical Therapy Evaluation Patient Details Name: Janet Zuniga MRN: [id number] DOB: 10/02/1955 Today's Date: 05/01/2020   History of Present Illness  Pt is a 65 y/o F admitted from home on 04/30/20 with c/c of acute weakness & N&V with diarrhea that started about a week until 2 days ago. Labs revealed hypokalemia. PMH: ankylosing spondylitis, anxiety/depression, HTN, OA, breast CA (L), chronic low back pain, Hep C, hip fx, lumbar spinal stenosis    Clinical Impression  Pt seen for PT evaluation with pt reporting independence at baseline & demonstrating independence with functional mobility (bed mobility, transfers, & gait) on this date without AD. Pt is able to toilet without assistance. Pt does not demonstrate any acute PT needs. Will sign off at this time. Please re-consult if needed.      Follow Up Recommendations No PT follow up;Supervision - Intermittent    Equipment Recommendations  None recommended by PT    Recommendations for Other Services       Precautions / Restrictions Precautions Precautions: None Restrictions Weight Bearing Restrictions: No      Mobility  Bed Mobility Overal bed mobility: Independent                  Transfers Overall transfer level: Independent                  Ambulation/Gait Ambulation/Gait assistance: Independent Gait Distance (Feet): 320 Feet Assistive device: None Gait Pattern/deviations: Decreased step length - right;Decreased step length - left;Decreased stride length Gait velocity: slightly decreased      Stairs            Wheelchair Mobility    Modified Rankin (Stroke Patients Only)       Balance Overall balance assessment: Mild deficits observed, not formally tested                                           Pertinent Vitals/Pain Pain Assessment: No/denies pain    Home Living Family/patient expects to be discharged to:: Private residence Living Arrangements: Alone Available  Help at Discharge: Friend(s);Available PRN/intermittently Type of Home: Apartment Home Access: Level entry;Elevator (lives in 6th floor apartment with elevator access)     Home Layout: One level Home Equipment: Environmental consultant - 2 wheels      Prior Function Level of Independence: Independent         Comments: endorses 1 fall prior to admission 2/2 acute onset of weakness     Hand Dominance        Extremity/Trunk Assessment   Upper Extremity Assessment Upper Extremity Assessment: Overall WFL for tasks assessed    Lower Extremity Assessment Lower Extremity Assessment: Overall WFL for tasks assessed       Communication   Communication: No difficulties  Cognition Arousal/Alertness: Awake/alert Behavior During Therapy: WFL for tasks assessed/performed Overall Cognitive Status: Within Functional Limits for tasks assessed                                 General Comments: pleasant & agreeable, eager to d/c home      General Comments General comments (skin integrity, edema, etc.): Max HR of 108 bpm with gait; uses bathroom during session with continent void & independent peri hygiene    Exercises     Assessment/Plan    PT Assessment Patent does not  need any further PT services  PT Problem List         PT Treatment Interventions      PT Goals (Current goals can be found in the Care Plan section)  Acute Rehab PT Goals Patient Stated Goal: go home PT Goal Formulation: With patient Time For Goal Achievement: 05/15/20 Potential to Achieve Goals: Good    Frequency     Barriers to discharge        Co-evaluation               AM-PAC PT "6 Clicks" Mobility  Outcome Measure Help needed turning from your back to your side while in a flat bed without using bedrails?: None Help needed moving from lying on your back to sitting on the side of a flat bed without using bedrails?: None Help needed moving to and from a bed to a chair (including a  wheelchair)?: None Help needed standing up from a chair using your arms (e.g., wheelchair or bedside chair)?: None Help needed to walk in hospital room?: None Help needed climbing 3-5 steps with a railing? : None 6 Click Score: 24    End of Session   Activity Tolerance: Patient tolerated treatment well Patient left: in chair;with call bell/phone within reach;with chair alarm set Nurse Communication: Mobility status      Time: 8756-4332 PT Time Calculation (min) (ACUTE ONLY): 12 min   Charges:   PT Evaluation $PT Eval Low Complexity: [place], PT, DPT 05/01/20, 9:54 AM   Waunita Schooner 05/01/2020, 9:53 AM

## 2020-05-01 NOTE — Discharge Summary (Signed)
Discharge Summary  Janet Zuniga YTK:[id number] DOB: [birth year]  PCP: Baxter Hire, MD  Admit date: 04/30/2020 Discharge date: 05/01/2020  Time spent: 79mins  Recommendations for Outpatient Follow-up:  1. F/u with PCP within a week  for hospital discharge follow up, repeat cbc/bmp /mag follow up    Discharge Diagnoses:  Active Hospital Problems   Diagnosis Date Noted  . Hypokalemia 01/12/2015    Resolved Hospital Problems  No resolved problems to display.    Discharge Condition: stable  Diet recommendation: heart healthy/carb modified  Filed Weights   04/30/20 1407  Weight: 66.7 kg    History of present illness: (Per admitting MD Dr. Sidney Zuniga) Janet Zuniga is a 65 y.o. Caucasian female with medical history significant for ankylosing spondylitis, anxiety/depression, hypertension, osteoarthritis and hypertension, who presented to emergency room with acute onset of generalized weakness after having intractable nausea and vomiting with diarrhea that started about a week ago until 2 days ago.  She denied any fever or chills.  No chest pain or dyspnea or cough or wheezing.  She denied any abdominal pain.  No dysuria, oliguria or hematuria or flank pain.  She has been having very poor appetite for the last couple of days.  No dysuria, oliguria or hematuria or flank pain.  ED Course: Upon presentation to the ER vital signs were within normal.  Labs revealed significant hypokalemia of 2.3 and a CO2 of 19 with anion gap of 15.  CBC showed anemia better than previous levels in August 2021. EKG as reviewed by me : EKG showed normal sinus rhythm with rate of 80 with low voltage QRS with poor R wave progression. I The patient was given 1 L bolus of IV lactated Ringer, 10 mill Cabbell of IV potassium chloride and 40 mEq p.o. KCl.  2 g of IV magnesium sulfate.  She will be admitted to an observation medical monitored bed for further evaluation and management  Hospital Course:  Active  Problems:   Hypokalemia   Volume deep lesion/dehydration/severe hypokalemia/weakness -Found recent acute gastroenteritis, reported had nausea vomiting diarrhea a week ago , GI symptom resolved 2 days ago prior to coming to the hospital -She received IV hydration, potassium supplement, symptom has much improved, she feels back to her baseline, tolerate regular diet, ambulating without issue, she desires to go home, she is advised to follow-up with PCP next week   Procedures:  None  Consultations:  None  Discharge Exam: BP 126/90   Pulse 77   Temp 98.3 F (36.8 C)   Resp 16   Ht 5\' 1"  (1.549 m)   Wt 66.7 kg   SpO2 100%   BMI 27.78 kg/m   General: NAD, pleasant Cardiovascular: RRR Respiratory: Normal respiratory effort Abdomen soft, nontender, positive bowel sounds  Discharge Instructions You were cared for by a hospitalist during your hospital stay. If you have any questions about your discharge medications or the care you received while you were in the hospital after you are discharged, you can call the unit and asked to speak with the hospitalist on call if the hospitalist that took care of you is not available. Once you are discharged, your primary care physician will handle any further medical issues. Please note that NO REFILLS for any discharge medications will be authorized once you are discharged, as it is imperative that you return to your primary care physician (or establish a relationship with a primary care physician if you do not have one) for your aftercare needs  so that they can reassess your need for medications and monitor your lab values.  Discharge Instructions    Diet general   Complete by: As directed    Increase activity slowly   Complete by: As directed      Allergies as of 05/01/2020      Reactions   Tramadol Nausea Only   Penicillins Rash, Other (See Comments)   Has patient had a PCN reaction causing immediate rash, facial/tongue/throat swelling,  SOB or lightheadedness with hypotension: Yes Has patient had a PCN reaction causing severe rash involving mucus membranes or skin necrosis: No Has patient had a PCN reaction that required hospitalization No Has patient had a PCN reaction occurring within the last 10 years: No If all of the above answers are "NO", then may proceed with Cephalosporin use.      Medication List    STOP taking these medications   ALPRAZolam 1 MG tablet Commonly known as: XANAX   ibuprofen 800 MG tablet Commonly known as: ADVIL     TAKE these medications   albuterol 108 (90 Base) MCG/ACT inhaler Commonly known as: VENTOLIN HFA Inhale 2 puffs into the lungs every 6 (six) hours as needed for wheezing or shortness of breath.   busPIRone 30 MG tablet Commonly known as: BUSPAR Take 1 tablet by mouth 2 (two) times daily.   carisoprodol 350 MG tablet Commonly known as: SOMA Take 1 tablet by mouth 3 (three) times daily as needed for muscle spasms.   clonazePAM 1 MG tablet Commonly known as: KLONOPIN Take 1 mg by mouth 3 (three) times daily as needed.   etanercept 50 MG/ML injection Commonly known as: ENBREL Inject 1 mL into the skin once a week.   FLUoxetine 40 MG capsule Commonly known as: PROZAC Take 1 capsule (40 mg total) by mouth daily.   traZODone 100 MG tablet Commonly known as: DESYREL Take 200 mg by mouth at bedtime.      Allergies  Allergen Reactions  . Tramadol Nausea Only  . Penicillins Rash and Other (See Comments)    Has patient had a PCN reaction causing immediate rash, facial/tongue/throat swelling, SOB or lightheadedness with hypotension: Yes Has patient had a PCN reaction causing severe rash involving mucus membranes or skin necrosis: No Has patient had a PCN reaction that required hospitalization No Has patient had a PCN reaction occurring within the last 10 years: No If all of the above answers are "NO", then may proceed with Cephalosporin use.     Follow-up  Information    Baxter Hire, MD Follow up in 1 week(s).   Specialty: Internal Medicine Why: hospital discharge follow up , pcp to repeat basic labs including cbc/bmp/mag Contact information: [place] [address] [phone]                The results of significant diagnostics from this hospitalization (including imaging, microbiology, ancillary and laboratory) are listed below for reference.    Significant Diagnostic Studies: No results found.  Microbiology: Recent Results (from the past 240 hour(s))  SARS CORONAVIRUS 2 (TAT 6-24 HRS) Nasopharyngeal Nasopharyngeal Swab     Status: None   Collection Time: 04/30/20  3:32 PM   Specimen: Nasopharyngeal Swab  Result Value Ref Range Status   SARS Coronavirus 2 NEGATIVE NEGATIVE Final    Comment: (NOTE) SARS-CoV-2 target nucleic acids are NOT DETECTED.  The SARS-CoV-2 RNA is generally detectable in upper and lower respiratory specimens during the acute phase of infection. Negative  results do not preclude SARS-CoV-2 infection, do not rule out co-infections with other pathogens, and should not be used as the sole basis for treatment or other patient management decisions. Negative results must be combined with clinical observations, patient history, and epidemiological information. The expected result is Negative.  Fact Sheet for Patients: SugarRoll.be  Fact Sheet for Healthcare Providers: https://www.woods-mathews.com/  This test is not yet approved or cleared by the [place] FDA and  has been authorized for detection and/or diagnosis of SARS-CoV-2 by FDA under an Emergency Use Authorization (EUA). This EUA will remain  in effect (meaning this test can be used) for the duration of the COVID-19 declaration under Se ction 564(b)(1) of the Act, 21 U.S.C. section 360bbb-3(b)(1), unless the authorization is terminated or revoked sooner.  Performed at  [facility], [place] [address], [place], [place] [address]      Labs: Basic Metabolic Panel: Recent Labs  Lab 04/30/20 1408 04/30/20 1612 05/01/20 0433  NA 135  --  139  K 2.3*  --  3.4*  CL 101  --  109  CO2 19*  --  21*  GLUCOSE 97  --  99  BUN 17  --  14  CREATININE 1.09*  --  0.73  CALCIUM 8.7*  --  8.2*  MG  --  1.9  --    Liver Function Tests: No results for input(s): AST, ALT, ALKPHOS, BILITOT, PROT, ALBUMIN in the last 168 hours. No results for input(s): LIPASE, AMYLASE in the last 168 hours. No results for input(s): AMMONIA in the last 168 hours. CBC: Recent Labs  Lab 04/30/20 1408 05/01/20 0433  WBC 9.4 9.4  HGB 11.6* 10.4*  HCT 33.1* 30.4*  MCV 90.7 92.7  PLT 218 233   Cardiac Enzymes: No results for input(s): CKTOTAL, CKMB, CKMBINDEX, TROPONINI in the last 168 hours. BNP: BNP (last 3 results) No results for input(s): BNP in the last 8760 hours.  ProBNP (last 3 results) No results for input(s): PROBNP in the last 8760 hours.  CBG: No results for input(s): GLUCAP in the last 168 hours.     Signed:  Florencia Reasons MD, PhD, FACP  [facility] 05/01/2020, 11:47 AM

## 2020-05-01 NOTE — Progress Notes (Signed)
Pt's PIV removed with tip intact. Discharge instructions explained with pt. Pt denies any questions or concerns. Pt to schedule follow up apt with primary care doctor on Monday.

## 2020-05-20 ENCOUNTER — Other Ambulatory Visit: Payer: Self-pay | Admitting: Internal Medicine

## 2020-05-20 DIAGNOSIS — Z1231 Encounter for screening mammogram for malignant neoplasm of breast: Secondary | ICD-10-CM

## 2020-06-07 ENCOUNTER — Ambulatory Visit
Admission: RE | Admit: 2020-06-07 | Discharge: 2020-06-07 | Disposition: A | Discharge status: Home / Self Care | Payer: Medicare Other | Source: Ambulatory Visit | Attending: Internal Medicine | Admitting: Internal Medicine

## 2020-06-07 ENCOUNTER — Other Ambulatory Visit: Payer: Self-pay

## 2020-06-07 DIAGNOSIS — Z1231 Encounter for screening mammogram for malignant neoplasm of breast: Secondary | ICD-10-CM | POA: Diagnosis not present

## 2020-06-09 ENCOUNTER — Ambulatory Visit: Payer: Medicare Other | Admitting: Podiatry

## 2020-06-10 ENCOUNTER — Ambulatory Visit: Payer: Medicare Other | Admitting: Podiatry

## 2020-06-16 ENCOUNTER — Ambulatory Visit (INDEPENDENT_AMBULATORY_CARE_PROVIDER_SITE_OTHER): Payer: Medicare Other | Admitting: Podiatry

## 2020-06-16 ENCOUNTER — Other Ambulatory Visit: Payer: Self-pay

## 2020-06-16 ENCOUNTER — Encounter: Payer: Self-pay | Admitting: Podiatry

## 2020-06-16 DIAGNOSIS — D2371 Other benign neoplasm of skin of right lower limb, including hip: Secondary | ICD-10-CM | POA: Diagnosis not present

## 2020-06-16 NOTE — Progress Notes (Signed)
Subjective:  Patient ID: Janet Zuniga, female    DOB: 1955-12-23,  MRN: [id number] HPI Chief Complaint  Patient presents with   Foot Pain    Plantar forefoot right - callused area x years, treated here in the past, tried OTC callus removers at home, but "can't get it to go away"   New Patient (Initial Visit)    Est pt 2018    65 y.o. female presents with the above complaint.   ROS: Denies fever chills nausea vomiting muscle aches pains calf pain back pain chest pain shortness of breath.  Past Medical History:  Diagnosis Date   Ankylosing spondylitis ([place])    Anxiety    Arthritis    Blood in stool    Breast cancer ([place]) 2010   Left breast- chemo/radiation   Chronic low back pain    Chronic pain    Depression    Headache    Hepatitis C 2007   Hip fracture ([place])    History of blood transfusion    Hypertension    Insomnia    OA (osteoarthritis)    Osteoporosis    Personal history of chemotherapy    Personal history of radiation therapy    Spinal stenosis of lumbar region    Spondyloarthritis    Past Surgical History:  Procedure Laterality Date   BREAST BIOPSY Right 1983   neg   BREAST EXCISIONAL BIOPSY Left 2010   triple neg   BREAST LUMPECTOMY Left 2010   FEMUR FRACTURE SURGERY     FOOT SURGERY  2000 and 2003   INTRAMEDULLARY (IM) NAIL INTERTROCHANTERIC Right 01/12/2015   Procedure: INTRAMEDULLARY (IM) NAIL INTERTROCHANTRIC;  Surgeon: Earnestine Leys, MD;  Location: [facility] ORS;  Service: Orthopedics;  Laterality: Right;   KNEE SURGERY Left 2004   TUBAL LIGATION      Current Outpatient Medications:    albuterol (VENTOLIN HFA) 108 (90 Base) MCG/ACT inhaler, Inhale 2 puffs into the lungs every 6 (six) hours as needed for wheezing or shortness of breath. (Patient not taking: No sig reported), Disp: 8 g, Rfl: 0   ALPRAZolam (XANAX) 1 MG tablet, Take 1 mg by mouth 2 (two) times daily as needed., Disp: , Rfl:    busPIRone (BUSPAR) 30 MG tablet, Take 1 tablet by mouth 2  (two) times daily., Disp: , Rfl:    carisoprodol (SOMA) 350 MG tablet, Take 1 tablet by mouth 3 (three) times daily as needed for muscle spasms., Disp: , Rfl:    etanercept (ENBREL) 50 MG/ML injection, Inject 1 mL into the skin once a week., Disp: , Rfl:    FLUoxetine (PROZAC) 40 MG capsule, Take 1 capsule (40 mg total) by mouth daily., Disp: 30 capsule, Rfl: 0   magnesium oxide (MAG-OX) 400 MG tablet, Take 1 tablet by mouth daily., Disp: , Rfl:    oxyCODONE-acetaminophen (PERCOCET/ROXICET) 5-325 MG tablet, Take 1 tablet by mouth every 6 (six) hours as needed., Disp: , Rfl:    traZODone (DESYREL) 100 MG tablet, Take 200 mg by mouth at bedtime. (Patient not taking: No sig reported), Disp: , Rfl:   Allergies  Allergen Reactions   Tramadol Nausea Only   Penicillins Rash and Other (See Comments)    Has patient had a PCN reaction causing immediate rash, facial/tongue/throat swelling, SOB or lightheadedness with hypotension: Yes Has patient had a PCN reaction causing severe rash involving mucus membranes or skin necrosis: No Has patient had a PCN reaction that required hospitalization No Has patient had a PCN reaction  occurring within the last 10 years: No If all of the above answers are "NO", then may proceed with Cephalosporin use.    Review of Systems Objective:  There were no vitals filed for this visit.  General: Well developed, nourished, in no acute distress, alert and oriented x3   Dermatological: Skin is warm, dry and supple bilateral. Nails x 10 are well maintained; remaining integument appears unremarkable at this time. There are no open sores, no preulcerative lesions, no rash or signs of infection present.  Reactive hyperkeratotic lesion.  To be benign in nature.  Vascular: Dorsalis Pedis artery and Posterior Tibial artery pedal pulses are 2/4 bilateral with immedate capillary fill time. Pedal hair growth present. No varicosities and no lower extremity edema present bilateral.    Neruologic: Grossly intact via light touch bilateral. Vibratory intact via tuning fork bilateral. Protective threshold with Semmes Wienstein monofilament intact to all pedal sites bilateral. Patellar and Achilles deep tendon reflexes 2+ bilateral. No Babinski or clonus noted bilateral.   Musculoskeletal: No gross boney pedal deformities bilateral. No pain, crepitus, or limitation noted with foot and ankle range of motion bilateral. Muscular strength 5/5 in all groups tested bilateral.  Plantarflexed fourth metatarsal.  Gait: Unassisted, Nonantalgic.    Radiographs:  None taken  Assessment & Plan:   Assessment: Benign skin lesion plantarflexed fourth metatarsal right foot  Plan: Debrided reactive benign skin lesion most likely caused by the plantarflexed metatarsal.     Caydn Justen T. [place], [place]

## 2020-07-12 DIAGNOSIS — J449 Chronic obstructive pulmonary disease, unspecified: Secondary | ICD-10-CM | POA: Insufficient documentation

## 2020-07-12 DIAGNOSIS — D84821 Immunodeficiency due to drugs: Secondary | ICD-10-CM | POA: Insufficient documentation

## 2020-07-22 ENCOUNTER — Telehealth: Payer: Self-pay

## 2020-07-22 ENCOUNTER — Telehealth: Payer: Medicare Other

## 2020-07-22 NOTE — Telephone Encounter (Signed)
Called patient twice to schedule CC screening. LVM to call office back to schedule.

## 2020-08-03 ENCOUNTER — Encounter: Payer: Self-pay | Admitting: *Deleted

## 2020-08-10 ENCOUNTER — Telehealth (INDEPENDENT_AMBULATORY_CARE_PROVIDER_SITE_OTHER): Payer: Medicare Other | Admitting: Gastroenterology

## 2020-08-10 DIAGNOSIS — Z1211 Encounter for screening for malignant neoplasm of colon: Secondary | ICD-10-CM

## 2020-08-10 MED ORDER — SUTAB 1479-225-188 MG PO TABS: 12.0000 | ORAL_TABLET | Freq: Once | ORAL | 0 refills | Status: AC

## 2020-08-10 NOTE — Progress Notes (Signed)
Gastroenterology Pre-Procedure Review  Request Date: 09/14/2020 Requesting Physician: Dr. Allen Norris  PATIENT REVIEW QUESTIONS: The patient responded to the following health history questions as indicated:    1. Are you having any GI issues?  Major hemorrhoids but ok right now 2. Do you have a personal history of Polyps? no 3. Do you have a family history of Colon Cancer or Polyps? yes (aunt  colon cancer) 4. Diabetes Mellitus? no 5. Joint replacements in the past 12 months? Rod in right leg 6. Major health problems in the past 3 months?no 7. Any artificial heart valves, MVP, or defibrillator?no    MEDICATIONS & ALLERGIES:    Patient reports the following regarding taking any anticoagulation/antiplatelet therapy:   Plavix, Coumadin, Eliquis, Xarelto, Lovenox, Pradaxa, Brilinta, or Effient? no Aspirin? no  Patient confirms/reports the following medications:  Current Outpatient Medications  Medication Sig Dispense Refill   albuterol (VENTOLIN HFA) 108 (90 Base) MCG/ACT inhaler Inhale 2 puffs into the lungs every 6 (six) hours as needed for wheezing or shortness of breath. 8 g 0   ALPRAZolam (XANAX) 1 MG tablet Take 1 mg by mouth 2 (two) times daily as needed.     busPIRone (BUSPAR) 30 MG tablet Take 1 tablet by mouth 2 (two) times daily.     carisoprodol (SOMA) 350 MG tablet Take 1 tablet by mouth 3 (three) times daily as needed for muscle spasms.     etanercept (ENBREL) 50 MG/ML injection Inject 1 mL into the skin once a week.     FLUoxetine (PROZAC) 40 MG capsule Take 1 capsule (40 mg total) by mouth daily. 30 capsule 0   magnesium oxide (MAG-OX) 400 MG tablet Take 1 tablet by mouth daily.     oxyCODONE-acetaminophen (PERCOCET/ROXICET) 5-325 MG tablet Take 1 tablet by mouth every 6 (six) hours as needed.     Sodium Sulfate-Mag Sulfate-KCl (SUTAB) [phone] MG TABS Take 12 tablets by mouth once for 1 dose. 24 tablet 0   traZODone (DESYREL) 100 MG tablet Take 200 mg by mouth at bedtime.      IBU 800 MG tablet Take 800 mg by mouth every 8 (eight) hours as needed.     No current facility-administered medications for this visit.    Patient confirms/reports the following allergies:  Allergies  Allergen Reactions   Tramadol Nausea Only   Penicillins Rash and Other (See Comments)    Has patient had a PCN reaction causing immediate rash, facial/tongue/throat swelling, SOB or lightheadedness with hypotension: Yes Has patient had a PCN reaction causing severe rash involving mucus membranes or skin necrosis: No Has patient had a PCN reaction that required hospitalization No Has patient had a PCN reaction occurring within the last 10 years: No If all of the above answers are "NO", then may proceed with Cephalosporin use.     No orders of the defined types were placed in this encounter.   AUTHORIZATION INFORMATION Primary Insurance: 1D#: Group #:  Secondary Insurance: 1D#: Group #:  SCHEDULE INFORMATION: Date: 09/14/2020 Time: Location: [facility]

## 2020-09-13 ENCOUNTER — Other Ambulatory Visit: Payer: Self-pay

## 2020-09-13 ENCOUNTER — Encounter: Payer: Self-pay | Admitting: Gastroenterology

## 2020-09-13 ENCOUNTER — Telehealth: Payer: Self-pay

## 2020-09-13 NOTE — Telephone Encounter (Signed)
Procedure has been rescheduled for 10/26/20.

## 2020-09-13 NOTE — Progress Notes (Signed)
Patient requested to reschedule due to bowel prep cost. Procedure has been rescheduled for 10/26/20. Endo unit has been notified. Sample prep and instructions will be left at the front desk for patient to pick up.

## 2020-09-13 NOTE — Telephone Encounter (Signed)
Pt. Calling to cancel screening

## 2020-10-20 ENCOUNTER — Ambulatory Visit: Payer: Medicare Other | Admitting: Podiatry

## 2020-10-25 ENCOUNTER — Encounter: Payer: Self-pay | Admitting: Gastroenterology

## 2020-10-26 ENCOUNTER — Encounter: Admission: RE | Payer: Self-pay | Source: Home / Self Care

## 2020-10-26 ENCOUNTER — Ambulatory Visit: Admission: RE | Admit: 2020-10-26 | Payer: Medicare Other | Source: Home / Self Care | Admitting: Gastroenterology

## 2020-10-26 SURGERY — COLONOSCOPY WITH PROPOFOL
Anesthesia: General

## 2021-01-17 ENCOUNTER — Other Ambulatory Visit: Payer: Self-pay

## 2021-01-17 ENCOUNTER — Encounter: Payer: Self-pay | Admitting: Podiatry

## 2021-01-17 ENCOUNTER — Ambulatory Visit (INDEPENDENT_AMBULATORY_CARE_PROVIDER_SITE_OTHER): Payer: Medicare Other | Admitting: Podiatry

## 2021-01-17 DIAGNOSIS — D2371 Other benign neoplasm of skin of right lower limb, including hip: Secondary | ICD-10-CM

## 2021-01-17 NOTE — Progress Notes (Signed)
She presents today chief complaint of painful callus plantar aspect of the forefoot right.  Objective: Vital signs are stable she alert oriented x3 pulses are palpable.  Severe osteoarthritic changes of the midfoot and rear foot and of the toes.  Currently she has a plantarflexed third metatarsal resulting in painful porokeratotic lesion forefoot right.  Assessment: Plantarflexed metatarsal resulting in benign skin lesion.  Plan: Debrided benign skin lesion.  Placed padding.  She needs to schedule with Aaron Edelman to make a set of orthotics that may very well have to be accommodative and will help offload that painful lesion plantarly.

## 2021-01-31 ENCOUNTER — Other Ambulatory Visit: Payer: Medicare Other

## 2021-04-11 ENCOUNTER — Other Ambulatory Visit: Payer: Self-pay | Admitting: Internal Medicine

## 2021-04-11 DIAGNOSIS — Z1231 Encounter for screening mammogram for malignant neoplasm of breast: Secondary | ICD-10-CM

## 2021-04-29 ENCOUNTER — Other Ambulatory Visit: Payer: Self-pay | Admitting: Surgery

## 2021-04-29 DIAGNOSIS — M7581 Other shoulder lesions, right shoulder: Secondary | ICD-10-CM

## 2021-05-13 ENCOUNTER — Ambulatory Visit: Payer: Medicare Other

## 2021-05-27 ENCOUNTER — Ambulatory Visit
Admission: RE | Admit: 2021-05-27 | Discharge: 2021-05-27 | Disposition: A | Discharge status: Home / Self Care | Payer: Medicare Other | Source: Ambulatory Visit | Attending: Surgery | Admitting: Surgery

## 2021-05-27 DIAGNOSIS — M7581 Other shoulder lesions, right shoulder: Secondary | ICD-10-CM | POA: Insufficient documentation

## 2021-06-09 ENCOUNTER — Ambulatory Visit
Admission: RE | Admit: 2021-06-09 | Discharge: 2021-06-09 | Disposition: A | Discharge status: Home / Self Care | Payer: Medicare Other | Source: Ambulatory Visit | Attending: Internal Medicine | Admitting: Internal Medicine

## 2021-06-09 DIAGNOSIS — Z1231 Encounter for screening mammogram for malignant neoplasm of breast: Secondary | ICD-10-CM | POA: Insufficient documentation

## 2021-06-28 ENCOUNTER — Other Ambulatory Visit: Payer: Self-pay | Admitting: Surgery

## 2021-07-06 ENCOUNTER — Encounter
Admission: RE | Admit: 2021-07-06 | Discharge: 2021-07-06 | Disposition: A | Discharge status: Home / Self Care | Payer: Medicare Other | Source: Ambulatory Visit | Attending: Surgery | Admitting: Surgery

## 2021-07-06 ENCOUNTER — Other Ambulatory Visit: Payer: Self-pay

## 2021-07-06 DIAGNOSIS — Z01812 Encounter for preprocedural laboratory examination: Secondary | ICD-10-CM

## 2021-07-06 NOTE — Patient Instructions (Addendum)
Your procedure is scheduled on: 07/12/21 - Tuesday Report to the Registration Desk on the 1st floor of the [facility]. To find out your arrival time, please call [phone] between 1PM - 3PM on: 07/11/21 - Monday If your arrival time is 6:00 am, do not arrive prior to that time as the [facility] entrance doors do not open until 6:00 am.  REMEMBER: Instructions that are not followed completely may result in serious medical risk, up to and including death; or upon the discretion of your surgeon and anesthesiologist your surgery may need to be rescheduled.  Do not eat food after midnight the night before surgery.  No gum chewing, lozengers or hard candies.  You may however, drink CLEAR liquids up to 2 hours before you are scheduled to arrive for your surgery. Do not drink anything within 2 hours of your scheduled arrival time.  Clear liquids include: - water  - apple juice without pulp - gatorade (not RED colors) - black coffee or tea (Do NOT add milk or creamers to the coffee or tea) Do NOT drink anything that is not on this list.  TAKE THESE MEDICATIONS THE MORNING OF SURGERY WITH A SIP OF WATER:  - busPIRone (BUSPAR) 30 MG tablet - ALPRAZolam (XANAX) 1 MG tablet if needed - lansoprazole (PREVACID) 15 MG capsule - take 1 capsule the night before surgery and take 1 capsule on the morning of surgery. - carisoprodol (SOMA) 350 MG tablet if needed   One week prior to surgery: Stop Anti-inflammatories (NSAIDS) such as Advil, Aleve, Ibuprofen, Motrin, Naproxen, Naprosyn and Aspirin based products such as Excedrin, Goodys Powder, BC Powder.  Stop ANY OVER THE COUNTER supplements until after surgery.Biotin w/ Vitamins C & E (HAIR SKIN & NAILS GUMMIES PO, Biotin w/ Vitamins C & E (HAIR SKIN & NAILS GUMMIES PO.  You may however, continue to take Tylenol if needed for pain up until the day of surgery.  No Alcohol for 24 hours before or after surgery.  No Smoking including  e-cigarettes for 24 hours prior to surgery.  No chewable tobacco products for at least 6 hours prior to surgery.  No nicotine patches on the day of surgery.  Do not use any "recreational" drugs for at least a week prior to your surgery.  Please be advised that the combination of cocaine and anesthesia may have negative outcomes, up to and including death. If you test positive for cocaine, your surgery will be cancelled.  On the morning of surgery brush your teeth with toothpaste and water, you may rinse your mouth with mouthwash if you wish. Do not swallow any toothpaste or mouthwash.  Use CHG Soap or wipes as directed on instruction sheet.  Do not wear jewelry, make-up, hairpins, clips or nail polish.  Do not wear lotions, powders, or perfumes.   Do not shave body from the neck down 48 hours prior to surgery just in case you cut yourself which could leave a site for infection.  Also, freshly shaved skin may become irritated if using the CHG soap.  Contact lenses, hearing aids and dentures may not be worn into surgery.  Do not bring valuables to the hospital. [facility] is not responsible for any missing/lost belongings or valuables.   Notify your doctor if there is any change in your medical condition (cold, fever, infection).  Wear comfortable clothing (specific to your surgery type) to the hospital.  After surgery, you can help prevent lung complications by doing breathing exercises.  Take deep breaths and cough every 1-2 hours. Your doctor may order a device called an Incentive Spirometer to help you take deep breaths. When coughing or sneezing, hold a pillow firmly against your incision with both hands. This is called "splinting." Doing this helps protect your incision. It also decreases belly discomfort.  If you are being admitted to the hospital overnight, leave your suitcase in the car. After surgery it may be brought to your room.  If you are being discharged the day of  surgery, you will not be allowed to drive home. You will need a responsible adult (18 years or older) to drive you home and stay with you that night.   If you are taking public transportation, you will need to have a responsible adult (18 years or older) with you. Please confirm with your physician that it is acceptable to use public transportation.   Please call the [facility] at [phone] if you have any questions about these instructions.  Surgery Visitation Policy:  Patients undergoing a surgery or procedure may have two family members or support persons with them as long as the person is not COVID-19 positive or experiencing its symptoms.   Inpatient Visitation:    Visiting hours are 7 a.m. to 8 p.m. Up to four visitors are allowed at one time in a patient room, including children. The visitors may rotate out with other people during the day. One designated support person (adult) may remain overnight.

## 2021-07-12 ENCOUNTER — Encounter: Payer: Self-pay | Admitting: Surgery

## 2021-07-12 ENCOUNTER — Ambulatory Visit: Payer: Medicare Other | Admitting: Anesthesiology

## 2021-07-12 ENCOUNTER — Ambulatory Visit
Admission: RE | Admit: 2021-07-12 | Discharge: 2021-07-12 | Disposition: A | Discharge status: Home / Self Care | Payer: Medicare Other | Attending: Surgery | Admitting: Surgery

## 2021-07-12 ENCOUNTER — Other Ambulatory Visit: Payer: Self-pay

## 2021-07-12 ENCOUNTER — Encounter
Admission: RE | Disposition: A | Discharge status: Home / Self Care | Payer: Self-pay | Source: Home / Self Care | Attending: Surgery

## 2021-07-12 ENCOUNTER — Ambulatory Visit: Payer: Medicare Other

## 2021-07-12 DIAGNOSIS — Z01810 Encounter for preprocedural cardiovascular examination: Secondary | ICD-10-CM | POA: Diagnosis not present

## 2021-07-12 DIAGNOSIS — M25811 Other specified joint disorders, right shoulder: Secondary | ICD-10-CM | POA: Diagnosis not present

## 2021-07-12 DIAGNOSIS — M75111 Incomplete rotator cuff tear or rupture of right shoulder, not specified as traumatic: Secondary | ICD-10-CM | POA: Insufficient documentation

## 2021-07-12 DIAGNOSIS — J449 Chronic obstructive pulmonary disease, unspecified: Secondary | ICD-10-CM | POA: Diagnosis not present

## 2021-07-12 DIAGNOSIS — I10 Essential (primary) hypertension: Secondary | ICD-10-CM | POA: Diagnosis not present

## 2021-07-12 DIAGNOSIS — Z853 Personal history of malignant neoplasm of breast: Secondary | ICD-10-CM | POA: Diagnosis not present

## 2021-07-12 DIAGNOSIS — M19011 Primary osteoarthritis, right shoulder: Secondary | ICD-10-CM | POA: Diagnosis not present

## 2021-07-12 DIAGNOSIS — B182 Chronic viral hepatitis C: Secondary | ICD-10-CM | POA: Diagnosis not present

## 2021-07-12 DIAGNOSIS — F17210 Nicotine dependence, cigarettes, uncomplicated: Secondary | ICD-10-CM | POA: Insufficient documentation

## 2021-07-12 DIAGNOSIS — Z01812 Encounter for preprocedural laboratory examination: Secondary | ICD-10-CM

## 2021-07-12 HISTORY — PX: SHOULDER ARTHROSCOPY WITH SUBACROMIAL DECOMPRESSION, ROTATOR CUFF REPAIR AND BICEP TENDON REPAIR: SHX5687

## 2021-07-12 SURGERY — SHOULDER ARTHROSCOPY WITH SUBACROMIAL DECOMPRESSION, ROTATOR CUFF REPAIR AND BICEP TENDON REPAIR
Anesthesia: Regional | Site: Shoulder | Laterality: Right

## 2021-07-12 MED ORDER — EPINEPHRINE PF 1 MG/ML IJ SOLN
INTRAMUSCULAR | Status: AC
  Filled 2021-07-12: qty 4

## 2021-07-12 MED ORDER — SUGAMMADEX SODIUM 200 MG/2ML IV SOLN
INTRAVENOUS | Status: DC | PRN
  Administered 2021-07-12: 200 mg via INTRAVENOUS

## 2021-07-12 MED ORDER — METOCLOPRAMIDE HCL 10 MG PO TABS: 5.0000 mg | ORAL_TABLET | Freq: Three times a day (TID) | ORAL | Status: DC | PRN

## 2021-07-12 MED ORDER — DEXAMETHASONE SODIUM PHOSPHATE 10 MG/ML IJ SOLN
INTRAMUSCULAR | Status: DC | PRN
  Administered 2021-07-12: 10 mg via INTRAVENOUS

## 2021-07-12 MED ORDER — MIDAZOLAM HCL 2 MG/2ML IJ SOLN: 1.0000 mg | INTRAMUSCULAR | Status: DC | PRN

## 2021-07-12 MED ORDER — KETOROLAC TROMETHAMINE 15 MG/ML IJ SOLN
15.0000 mg | Freq: Once | INTRAMUSCULAR | Status: AC
  Administered 2021-07-12: 15 mg via INTRAVENOUS

## 2021-07-12 MED ORDER — FENTANYL CITRATE (PF) 100 MCG/2ML IJ SOLN: 25.0000 ug | INTRAMUSCULAR | Status: DC | PRN

## 2021-07-12 MED ORDER — PHENYLEPHRINE HCL (PRESSORS) 10 MG/ML IV SOLN
INTRAVENOUS | Status: DC | PRN
  Administered 2021-07-12: 160 ug via INTRAVENOUS
  Administered 2021-07-12 (×2): 80 ug via INTRAVENOUS

## 2021-07-12 MED ORDER — VANCOMYCIN HCL IN DEXTROSE 1-5 GM/200ML-% IV SOLN
INTRAVENOUS | Status: AC
  Filled 2021-07-12: qty 200

## 2021-07-12 MED ORDER — PROPOFOL 10 MG/ML IV BOLUS
INTRAVENOUS | Status: DC | PRN
  Administered 2021-07-12: 100 mg via INTRAVENOUS

## 2021-07-12 MED ORDER — ONDANSETRON HCL 4 MG/2ML IJ SOLN
INTRAMUSCULAR | Status: DC | PRN
  Administered 2021-07-12: 4 mg via INTRAVENOUS

## 2021-07-12 MED ORDER — FENTANYL CITRATE PF 50 MCG/ML IJ SOSY
PREFILLED_SYRINGE | INTRAMUSCULAR | Status: AC
  Administered 2021-07-12: 50 ug via INTRAVENOUS
  Filled 2021-07-12: qty 1

## 2021-07-12 MED ORDER — ONDANSETRON HCL 4 MG/2ML IJ SOLN: 4.0000 mg | Freq: Four times a day (QID) | INTRAMUSCULAR | Status: DC | PRN

## 2021-07-12 MED ORDER — ONDANSETRON HCL 4 MG PO TABS: 4.0000 mg | ORAL_TABLET | Freq: Four times a day (QID) | ORAL | Status: DC | PRN

## 2021-07-12 MED ORDER — OXYCODONE HCL 5 MG PO TABS: 5.0000 mg | ORAL_TABLET | Freq: Once | ORAL | Status: DC | PRN

## 2021-07-12 MED ORDER — PHENYLEPHRINE HCL-NACL 20-0.9 MG/250ML-% IV SOLN
INTRAVENOUS | Status: DC | PRN
  Administered 2021-07-12: 50 ug/min via INTRAVENOUS

## 2021-07-12 MED ORDER — PHENYLEPHRINE HCL-NACL 20-0.9 MG/250ML-% IV SOLN
INTRAVENOUS | Status: AC
Start: 2021-07-12 — End: ?
  Filled 2021-07-12: qty 250

## 2021-07-12 MED ORDER — VANCOMYCIN HCL IN DEXTROSE 1-5 GM/200ML-% IV SOLN
1000.0000 mg | INTRAVENOUS | Status: AC
  Administered 2021-07-12: 1000 mg via INTRAVENOUS

## 2021-07-12 MED ORDER — ACETAMINOPHEN 10 MG/ML IV SOLN
INTRAVENOUS | Status: DC | PRN
  Administered 2021-07-12: 1000 mg via INTRAVENOUS

## 2021-07-12 MED ORDER — LACTATED RINGERS IV SOLN: INTRAVENOUS | Status: DC

## 2021-07-12 MED ORDER — FENTANYL CITRATE (PF) 100 MCG/2ML IJ SOLN
INTRAMUSCULAR | Status: AC
  Filled 2021-07-12: qty 2

## 2021-07-12 MED ORDER — ONDANSETRON HCL 4 MG/2ML IJ SOLN: 4.0000 mg | Freq: Once | INTRAMUSCULAR | Status: DC | PRN

## 2021-07-12 MED ORDER — MIDAZOLAM HCL 2 MG/2ML IJ SOLN
INTRAMUSCULAR | Status: AC
  Administered 2021-07-12: 1 mg via INTRAVENOUS
  Filled 2021-07-12: qty 2

## 2021-07-12 MED ORDER — LACTATED RINGERS IV SOLN: INTRAVENOUS | Status: DC | PRN

## 2021-07-12 MED ORDER — KETOROLAC TROMETHAMINE 15 MG/ML IJ SOLN: 15.0000 mg | Freq: Once | INTRAMUSCULAR | Status: DC

## 2021-07-12 MED ORDER — BUPIVACAINE LIPOSOME 1.3 % IJ SUSP
INTRAMUSCULAR | Status: AC
  Filled 2021-07-12: qty 10

## 2021-07-12 MED ORDER — GLYCOPYRROLATE 0.2 MG/ML IJ SOLN
INTRAMUSCULAR | Status: DC | PRN
  Administered 2021-07-12: .2 mg via INTRAVENOUS

## 2021-07-12 MED ORDER — ROCURONIUM BROMIDE 10 MG/ML (PF) SYRINGE
PREFILLED_SYRINGE | INTRAVENOUS | Status: AC
Start: 2021-07-12 — End: ?
  Filled 2021-07-12: qty 10

## 2021-07-12 MED ORDER — ROCURONIUM BROMIDE 100 MG/10ML IV SOLN
INTRAVENOUS | Status: DC | PRN
  Administered 2021-07-12: 10 mg via INTRAVENOUS
  Administered 2021-07-12: 40 mg via INTRAVENOUS

## 2021-07-12 MED ORDER — KETOROLAC TROMETHAMINE 15 MG/ML IJ SOLN
INTRAMUSCULAR | Status: AC
  Filled 2021-07-12: qty 1

## 2021-07-12 MED ORDER — BUPIVACAINE LIPOSOME 1.3 % IJ SUSP
INTRAMUSCULAR | Status: DC | PRN
  Administered 2021-07-12: 10 mL

## 2021-07-12 MED ORDER — OXYCODONE HCL 5 MG PO TABS: 5.0000 mg | ORAL_TABLET | ORAL | Status: DC | PRN

## 2021-07-12 MED ORDER — BUPIVACAINE HCL (PF) 0.5 % IJ SOLN
INTRAMUSCULAR | Status: DC | PRN
  Administered 2021-07-12: 10 mL

## 2021-07-12 MED ORDER — METOCLOPRAMIDE HCL 5 MG/ML IJ SOLN: 5.0000 mg | Freq: Three times a day (TID) | INTRAMUSCULAR | Status: DC | PRN

## 2021-07-12 MED ORDER — BUPIVACAINE-EPINEPHRINE (PF) 0.5% -1:200000 IJ SOLN
INTRAMUSCULAR | Status: AC
  Filled 2021-07-12: qty 30

## 2021-07-12 MED ORDER — FENTANYL CITRATE PF 50 MCG/ML IJ SOSY: 50.0000 ug | PREFILLED_SYRINGE | Freq: Once | INTRAMUSCULAR | Status: AC

## 2021-07-12 MED ORDER — SODIUM CHLORIDE 0.9 % IV SOLN: INTRAVENOUS | Status: DC

## 2021-07-12 MED ORDER — ORAL CARE MOUTH RINSE: 15.0000 mL | Freq: Once | OROMUCOSAL | Status: AC

## 2021-07-12 MED ORDER — ACETAMINOPHEN 10 MG/ML IV SOLN
INTRAVENOUS | Status: AC
Start: 2021-07-12 — End: ?
  Filled 2021-07-12: qty 100

## 2021-07-12 MED ORDER — CHLORHEXIDINE GLUCONATE 0.12 % MT SOLN: 15.0000 mL | Freq: Once | OROMUCOSAL | Status: AC

## 2021-07-12 MED ORDER — FENTANYL CITRATE (PF) 100 MCG/2ML IJ SOLN
INTRAMUSCULAR | Status: DC | PRN
Start: 2021-07-12 — End: 2021-07-12
  Administered 2021-07-12: 50 ug via INTRAVENOUS

## 2021-07-12 MED ORDER — BUPIVACAINE HCL (PF) 0.5 % IJ SOLN
INTRAMUSCULAR | Status: AC
  Filled 2021-07-12: qty 10

## 2021-07-12 MED ORDER — OXYCODONE HCL 5 MG/5ML PO SOLN: 5.0000 mg | Freq: Once | ORAL | Status: DC | PRN

## 2021-07-12 MED ORDER — PROPOFOL 10 MG/ML IV BOLUS
INTRAVENOUS | Status: AC
Start: 2021-07-12 — End: ?
  Filled 2021-07-12: qty 20

## 2021-07-12 MED ORDER — BUPIVACAINE-EPINEPHRINE 0.5% -1:200000 IJ SOLN
INTRAMUSCULAR | Status: DC | PRN
  Administered 2021-07-12: 30 mL

## 2021-07-12 MED ORDER — ACETAMINOPHEN 10 MG/ML IV SOLN: 1000.0000 mg | Freq: Once | INTRAVENOUS | Status: DC | PRN

## 2021-07-12 MED ORDER — CHLORHEXIDINE GLUCONATE 0.12 % MT SOLN
OROMUCOSAL | Status: AC
  Administered 2021-07-12: 15 mL via OROMUCOSAL
  Filled 2021-07-12: qty 15

## 2021-07-12 MED ORDER — LIDOCAINE HCL (PF) 2 % IJ SOLN
INTRAMUSCULAR | Status: AC
  Filled 2021-07-12: qty 5

## 2021-07-12 MED ORDER — RINGERS IRRIGATION IR SOLN
Status: DC | PRN
  Administered 2021-07-12: 6000 mL

## 2021-07-12 SURGICAL SUPPLY — 59 items
ANCH SUT 2 2.9 2 LD TPR NDL (Anchor) ×1 IMPLANT
ANCH SUT BN ASCP DLV (Anchor) ×1 IMPLANT
ANCH SUT RGNRT REGENETEN (Staple) ×1 IMPLANT
ANCHOR BONE REGENETEN (Anchor) ×1 IMPLANT
ANCHOR HEALICOIL REGEN 5.5 (Anchor) IMPLANT
ANCHOR JUGGERKNOT WTAP NDL 2.9 (Anchor) ×1 IMPLANT
ANCHOR SUT QUATTRO KNTLS 4.5 (Anchor) IMPLANT
ANCHOR SUT W/ ORTHOCORD (Anchor) IMPLANT
ANCHOR TENDON REGENETEN (Staple) ×1 IMPLANT
APL PRP STRL LF DISP 70% ISPRP (MISCELLANEOUS) ×2
BIT DRILL JUGRKNT W/NDL BIT2.9 (DRILL) IMPLANT
BLADE FULL RADIUS 3.5 (BLADE) ×2 IMPLANT
BUR ACROMIONIZER 4.0 (BURR) ×2 IMPLANT
CANNULA SHAVER 8MMX76MM (CANNULA) ×2 IMPLANT
CHLORAPREP W/TINT 26 (MISCELLANEOUS) ×3 IMPLANT
COVER MAYO STAND REUSABLE (DRAPES) ×2 IMPLANT
DILATOR 5.5 THREADED HEALICOIL (MISCELLANEOUS) IMPLANT
DRILL JUGGERKNOT W/NDL BIT 2.9 (DRILL) ×2
ELECT CAUTERY BLADE 6.4 (BLADE) ×1 IMPLANT
ELECT REM PT RETURN 9FT ADLT (ELECTROSURGICAL) ×2
ELECTRODE REM PT RTRN 9FT ADLT (ELECTROSURGICAL) ×1 IMPLANT
GAUZE SPONGE 4X4 12PLY STRL (GAUZE/BANDAGES/DRESSINGS) ×2 IMPLANT
GAUZE XEROFORM 1X8 LF (GAUZE/BANDAGES/DRESSINGS) ×2 IMPLANT
GLOVE BIO SURGEON STRL SZ7.5 (GLOVE) ×4 IMPLANT
GLOVE BIO SURGEON STRL SZ8 (GLOVE) ×4 IMPLANT
GLOVE BIOGEL PI IND STRL 8 (GLOVE) ×1 IMPLANT
GLOVE BIOGEL PI INDICATOR 8 (GLOVE) ×1
GLOVE SURG UNDER LTX SZ8 (GLOVE) ×2 IMPLANT
GOWN STRL REUS W/ TWL LRG LVL3 (GOWN DISPOSABLE) ×1 IMPLANT
GOWN STRL REUS W/ TWL XL LVL3 (GOWN DISPOSABLE) ×1 IMPLANT
GOWN STRL REUS W/TWL LRG LVL3 (GOWN DISPOSABLE) ×2
GOWN STRL REUS W/TWL XL LVL3 (GOWN DISPOSABLE) ×4
GRASPER SUT 15 45D LOW PRO (SUTURE) IMPLANT
IMPL REGENETEN MEDIUM (Shoulder) IMPLANT
IMPLANT REGENETEN MEDIUM (Shoulder) ×2 IMPLANT
IV LACTATED RINGER IRRG 3000ML (IV SOLUTION) ×4
IV LR IRRIG 3000ML ARTHROMATIC (IV SOLUTION) ×2 IMPLANT
KIT CANNULA 8X76-LX IN CANNULA (CANNULA) IMPLANT
MANIFOLD NEPTUNE II (INSTRUMENTS) ×4 IMPLANT
MASK FACE SPIDER DISP (MASK) ×2 IMPLANT
MAT ABSORB  FLUID 56X50 GRAY (MISCELLANEOUS) ×1
MAT ABSORB FLUID 56X50 GRAY (MISCELLANEOUS) ×1 IMPLANT
PACK ARTHROSCOPY SHOULDER (MISCELLANEOUS) ×2 IMPLANT
PAD ABD DERMACEA PRESS 5X9 (GAUZE/BANDAGES/DRESSINGS) ×4 IMPLANT
PASSER SUT FIRSTPASS SELF (INSTRUMENTS) IMPLANT
SLING ARM LRG DEEP (SOFTGOODS) ×2 IMPLANT
SLING ULTRA II LG (MISCELLANEOUS) ×2 IMPLANT
SPONGE T-LAP 18X18 ~~LOC~~+RFID (SPONGE) ×2 IMPLANT
STAPLER SKIN PROX 35W (STAPLE) ×2 IMPLANT
STRAP SAFETY 5IN WIDE (MISCELLANEOUS) ×3 IMPLANT
SUT ETHIBOND 0 MO6 C/R (SUTURE) ×2 IMPLANT
SUT ULTRABRAID 2 COBRAID 38 (SUTURE) IMPLANT
SUT VIC AB 2-0 CT1 27 (SUTURE) ×4
SUT VIC AB 2-0 CT1 TAPERPNT 27 (SUTURE) ×2 IMPLANT
TAPE MICROFOAM 4IN (TAPE) ×2 IMPLANT
TUBING CONNECTING 10 (TUBING) ×2 IMPLANT
TUBING INFLOW SET DBFLO PUMP (TUBING) ×2 IMPLANT
WAND WEREWOLF FLOW 90D (MISCELLANEOUS) ×2 IMPLANT
WATER STERILE IRR 500ML POUR (IV SOLUTION) ×1 IMPLANT

## 2021-07-12 NOTE — TOC Progression Note (Signed)
Transition of Care [facility]) - Progression Note    Patient Details  Name: Janet Zuniga MRN: [id number] Date of Birth: 12/28/1955  Transition of Care [facility]) CM/SW [place], RN Phone Number: 07/12/2021, 9:37 AM  Clinical Narrative:    [facility] accepted the patient for [facility]        Expected Discharge Plan and Services                                                 Social Determinants of Health (SDOH) Interventions    Readmission Risk Interventions     No data to display

## 2021-07-12 NOTE — Anesthesia Preprocedure Evaluation (Addendum)
Anesthesia Evaluation  Patient identified by MRN, date of birth, ID band Patient awake    Reviewed: Allergy & Precautions, NPO status , Patient's Chart, lab work & pertinent test results  History of Anesthesia Complications Negative for: history of anesthetic complications  Airway Mallampati: II   Neck ROM: Full    Dental  (+) Partial Lower, Partial Upper   Pulmonary COPD, Current Smoker (less than 1/2 ppd)Patient did not abstain from smoking.,    Pulmonary exam normal breath sounds clear to auscultation       Cardiovascular hypertension, Normal cardiovascular exam Rhythm:Regular Rate:Normal  ECG 07/12/21:  Normal sinus rhythm Low voltage QRS Cannot rule out Anterior infarct , age undetermined   Neuro/Psych  Headaches, PSYCHIATRIC DISORDERS Anxiety Depression Chronic pain    GI/Hepatic negative GI ROS, (+) Hepatitis -, C  Endo/Other  negative endocrine ROS  Renal/GU negative Renal ROS     Musculoskeletal   Abdominal   Peds  Hematology Breast CA   Anesthesia Other Findings   Reproductive/Obstetrics                            Anesthesia Physical Anesthesia Plan  ASA: 3  Anesthesia Plan: General and Regional   Post-op Pain Management: Regional block*   Induction: Intravenous  PONV Risk Score and Plan: 2 and Ondansetron, Dexamethasone and Treatment may vary due to age or medical condition  Airway Management Planned: Oral ETT  Additional Equipment:   Intra-op Plan:   Post-operative Plan: Extubation in OR  Informed Consent: I have reviewed the patients History and Physical, chart, labs and discussed the procedure including the risks, benefits and alternatives for the proposed anesthesia with the patient or authorized representative who has indicated his/her understanding and acceptance.     Dental advisory given  Plan Discussed with: CRNA  Anesthesia Plan Comments: (Plan for  preoperative interscalene nerve block and GETA.  Patient consented for risks of anesthesia including but not limited to:  - adverse reactions to medications - damage to eyes, teeth, lips or other oral mucosa - nerve damage due to positioning  - sore throat or hoarseness - damage to heart, brain, nerves, lungs, other parts of body or loss of life  Informed patient about role of CRNA in peri- and intra-operative care.  Patient voiced understanding.)       Anesthesia Quick Evaluation

## 2021-07-12 NOTE — Anesthesia Procedure Notes (Signed)
Procedure Name: Intubation Date/Time: 07/12/2021 7:56 AM  Performed by: Natasha Mead, CRNAPre-anesthesia Checklist: Patient identified, Emergency Drugs available, Suction available and Patient being monitored Patient Re-evaluated:Patient Re-evaluated prior to induction Oxygen Delivery Method: Circle system utilized Preoxygenation: Pre-oxygenation with 100% oxygen Induction Type: IV induction Ventilation: Mask ventilation without difficulty Laryngoscope Size: Miller and 2 Grade View: Grade I Tube type: Oral Tube size: 7.0 mm Number of attempts: 1 Airway Equipment and Method: Stylet and Oral airway Placement Confirmation: ETT inserted through vocal cords under direct vision, positive ETCO2 and breath sounds checked- equal and bilateral Secured at: 19 cm Tube secured with: Tape Dental Injury: Teeth and Oropharynx as per pre-operative assessment

## 2021-07-12 NOTE — Anesthesia Procedure Notes (Signed)
Anesthesia Regional Block: Interscalene brachial plexus block   Pre-Anesthetic Checklist: , timeout performed,  Correct Patient, Correct Site, Correct Laterality,  Correct Procedure,, risks and benefits discussed,  Surgical consent,  Pre-op evaluation,  At surgeon's request and post-op pain management  Laterality: Right  Prep: chloraprep       Needles:  Injection technique: Single-shot  Needle Type: Stimiplex          Additional Needles:   Procedures:,,,, ultrasound used (permanent image in chart),,   Motor weakness within 20 minutes.  Narrative:  Start time: 07/12/2021 7:28 AM End time: 07/12/2021 7:31 AM Injection made incrementally with aspirations every 5 mL.  Performed by: Personally  Anesthesiologist: Darrin Nipper, MD  Additional Notes: Functioning IV was confirmed and monitors applied.  Sterile prep and drape, hand hygiene and sterile gloves were used. Ultrasound guidance: relevant anatomy identified, needle position confirmed, local anesthetic spread visualized around nerve(s), vascular puncture avoided.  Image saved to electronic medical record.  Negative aspiration prior to incremental administration of local anesthetic for total 10 ml Exparel and 20 ml bupivacaine 0.25% given in interscalene distribution. The patient tolerated the procedure well. Vital signs and moderate sedation medications recorded in RN notes.

## 2021-07-12 NOTE — Anesthesia Postprocedure Evaluation (Signed)
Anesthesia Post Note  Patient: Janet Zuniga  Procedure(s) Performed: RIGHT SHOULDER ARTHROSCOPY WITH DEBRIDEMENT, DECOMPRESSION, ROTATOR CUFF REPAIR, AND  BICEPS TENODESIS. (Right: Shoulder)  Patient location during evaluation: PACU Anesthesia Type: Regional Level of consciousness: awake and alert, oriented and patient cooperative Pain management: pain level controlled Vital Signs Assessment: post-procedure vital signs reviewed and stable Respiratory status: spontaneous breathing, nonlabored ventilation and respiratory function stable Cardiovascular status: blood pressure returned to baseline and stable Postop Assessment: adequate PO intake Anesthetic complications: no   No notable events documented.   Last Vitals:  Vitals:   07/12/21 1015 07/12/21 1025  BP: (!) 159/90 (!) 153/74  Pulse: 79 82  Resp: 18 15  Temp: (!) 36.1 C (!) 36.1 C  SpO2: 94% 95%    Last Pain:  Vitals:   07/12/21 1025  TempSrc: Temporal  PainSc: 0-No pain                 Darrin Nipper

## 2021-07-12 NOTE — Op Note (Signed)
07/12/2021  9:50 AM  Patient:   Janet Zuniga  Pre-Op Diagnosis:   Impingement/tendinopathy with partial-thickness rotator cuff tear, right shoulder.  Post-Op Diagnosis:   Impingement/tendinopathy with partial-thickness rotator cuff tear, Type I degenerative SLAP tear, degenerative joint disease, and biceps tendinopathy, right shoulder.  Procedure:   Extensive arthroscopic debridement, arthroscopic subacromial decompression, mini-open rotator cuff repair using a Smith & Nephew Regeneten patch, and mini-open biceps tenodesis, right shoulder.  Anesthesia:   General endotracheal with interscalene block using Exparel placed preoperatively by the anesthesiologist.  Surgeon:   Pascal Lux, MD  Assistant:   Cameron Proud, PA-C; Almon Register, PA-S  Findings:   As above. There were diffuse grade 3 chondromalacial changes involving the glenoid and grade 2-3 chondromalacial changes involving the humeral head. There was significant degenerative tearing of the superior portion of the labrum extending anteriorly and posteriorly without frank detachment from the glenoid rim. There was a partial-thickness tear involving the anterior insertional fibers of the supraspinatus tendon, as well as minimal partial-thickness tearing of the superior insertional fibers of the subscapularis tendon without compromise of the footprint. The remainder of the rotator cuff was in satisfactory condition. The biceps tendon demonstrated moderate tendinopathic changes without partial or full-thickness tearing.  Complications:   None  Fluids:   500 cc  Estimated blood loss:   5 cc  Tourniquet time:   None  Drains:   None  Closure:   Staples      Brief clinical note:   The patient is a 66 year old female with a history of progressively worsening right shoulder pain. The patient's symptoms have progressed despite medications, activity modification, etc. The patient's history and examination are consistent with  impingement/tendinopathy with a probable rotator cuff tear. These findings were confirmed by MRI scan. The patient presents at this time for definitive management of these shoulder symptoms.  Procedure:   The patient underwent placement of an interscalene block using Exparel by the anesthesiologist in the preoperative holding area before being brought into the operating room and lain in the supine position. The patient then underwent general endotracheal intubation and anesthesia before being repositioned in the beach chair position using the beach chair positioner. The right shoulder and upper extremity were prepped with ChloraPrep solution before being draped sterilely. Preoperative antibiotics were administered. A timeout was performed to confirm the proper surgical site before the expected portal sites and incision site were injected with 0.5% Sensorcaine with epinephrine.   A posterior portal was created and the glenohumeral joint thoroughly inspected with the findings as described above. An anterior portal was created using an outside-in technique. The labrum and rotator cuff were further probed, again confirming the above-noted findings. The areas of degenerative labral tearing were debrided back to stable margins using the full-radius resector, as were areas of loose articular cartilage from the glenoid and humerus. The frayed margins of the rotator cuff tears also were debrided back to stable margins using the full-radius resector. Finally, areas of extensive synovitis also were debrided back to stable margins using the full-radius resector. The ArthroCare wand was inserted and used to obtain hemostasis as well as to "anneal" the labrum superiorly and anteriorly. The instruments were removed from the joint after suctioning the excess fluid.  The camera was repositioned through the posterior portal into the subacromial space. A separate lateral portal was created using an outside-in technique. The 3.5  mm full-radius resector was introduced and used to perform a subtotal bursectomy. The ArthroCare wand was then inserted  and used to remove the periosteal tissue off the undersurface of the anterior third of the acromion as well as to recess the coracoacromial ligament from its attachment along the anterior and lateral margins of the acromion. The 4.0 mm acromionizing bur was introduced and used to complete the decompression by removing the undersurface of the anterior third of the acromion. The full radius resector was reintroduced to remove any residual bony debris before the ArthroCare wand was reintroduced to obtain hemostasis. The instruments were then removed from the subacromial space after suctioning the excess fluid.  An approximately 4-5 cm incision was made over the anterolateral aspect of the shoulder beginning at the anterolateral corner of the acromion and extending distally in line with the bicipital groove. This incision was carried down through the subcutaneous tissues to expose the deltoid fascia. The raphae between the anterior and middle thirds was identified and this plane developed to provide access into the subacromial space. Additional bursal tissues were debrided sharply using Metzenbaum scissors. The rotator cuff was carefully inspected.  The bursal surface was noted to have moderate fraying/scuffing but no obvious full-thickness tear was identified.  There was an area of "softening/thinning" involving the anterior insertional fibers of the supraspinatus tendon, consistent with a partial-thickness tear identified on the articular side arthroscopically.   Given that this was only an articular sided partial-thickness tear, it was felt best to reinforce it with a [place] patch in order to stimulate healing rather than to take it down and try to create a full-thickness tear. Therefore, the tear was repaired using a medium Smith & Nephew patch which was applied over the  anterior fibers of the supraspinatus tendon. It was secured using the appropriate bone and soft tissue staples. An apparent watertight closure was obtained. Applying the patch to the supraspinatus tendon required 15 to 20 minutes of extra surgical time as well as adding a level of complexity/difficulty to the procedure.  The bicipital groove was identified by palpation and opened for 1-1.5 cm. The biceps tendon stump was retrieved through this defect. The floor of the bicipital groove was roughened with a curet before another Biomet 2.9 mm JuggerKnot anchor was inserted. Both sets of sutures were passed through the biceps tendon and tied securely to effect the tenodesis. The bicipital sheath was reapproximated using two #0 Ethibond interrupted sutures, incorporating the biceps tendon to further reinforce the tenodesis.  The wound was copiously irrigated with sterile saline solution before the deltoid raphae was reapproximated using 2-0 Vicryl interrupted sutures. The subcutaneous tissues were closed in two layers using 2-0 Vicryl interrupted sutures before the skin was closed using staples. The portal sites also were closed using staples. A sterile bulky dressing was applied to the shoulder before the arm was placed into a shoulder immobilizer. The patient was then awakened, extubated, and returned to the recovery room in satisfactory condition after tolerating the procedure well.

## 2021-07-12 NOTE — H&P (Signed)
History of Present Illness:  Janet Zuniga is a 66 y.o. female who presents for follow-up of her right shoulder pain secondary to impingement/tendinopathy with a possible rotator cuff tear. The patient notes little change in her symptoms since she was last evaluated 2 months ago. She continues to complain of moderate to severe pain in the shoulder which she rates as high as 7/10, and for which she has been taking ibuprofen and oxycodone as necessary with relief. The latter medication has been prescribed by her pain clinic provider for more generalized chronic arthritic type pain symptoms. The patient continues to complain of pain with activities at above shoulder level, as well as at night. She localizes the pain to the lateral aspect of her right shoulder. She denies any reinjury to her shoulder since her last visit. Since her last visit, she has undergone an MRI scan and presents today to review these results.  Current Outpatient Medications: ALPRAZolam (XANAX) 1 MG tablet Take 1 tablet (1 mg total) by mouth 2 (two) times daily as needed for Anxiety 60 tablet 2  busPIRone (BUSPAR) 30 MG tablet Take 1 tablet (30 mg total) by mouth 2 (two) times daily 60 tablet 2  etanercept (ENBREL SURECLICK) 50 mg/mL (1 mL) pen injector Inject 1 mL (50 mg total) subcutaneously once a week 12 mL 3  ibuprofen (IBU) 800 MG tablet Take 1 tablet (800 mg total) by mouth every 8 (eight) hours as needed for Pain 90 tablet 0  lansoprazole (PREVACID) 15 MG DR capsule Take 15 mg by mouth as needed Patient takes as needed.  oxyCODONE-acetaminophen (PERCOCET) 5-325 mg tablet Take 1 tablet by mouth 3 (three) times daily   Allergies:  Penicillins Rash  Tramadol Nausea  Hydrocodone-Acetaminophen Other (See Comments)  Feels like bugs are crawling on her   Past Medical History:  Anxiety  Breast cancer (CMS-HCC) 05/2008  Depression  Generalized headaches  Hepatitis C  Insomnia  Malignant neoplasm of left female breast  (CMS-HCC) 09/12/2015  Followed by Dr. Grayland Ormond ARMc  Sleep disturbance   Past Surgical History:  MASTECTOMY PARTIAL / LUMPECTOMY Left 05/2008  foot surgery  KNEE ARTHROSCOPY Left  MASTECTOMY SURGERY  RIGHT FOOT SURGERY   Family History:  High blood pressure (Hypertension) Mother  Hyperlipidemia (Elevated cholesterol) Mother  Heart disease Mother  No Known Problems Father  Osteoporosis (Thinning of bones) Other  Stroke Other   Social History:   Socioeconomic History:  Marital status: Single  Tobacco Use  Smoking status: Every Day  Packs/day: 0.50  Years: 41.00  Pack years: 20.50  Types: Cigarettes  Smokeless tobacco: Never  Tobacco comments:  educational material attached to avs 10/05/11  Vaping Use  Vaping Use: Never used  Substance and Sexual Activity  Alcohol use: Yes  Alcohol/week: 1.0 - 2.0 standard drink  Types: 1 - 2 Glasses of wine per week  Drug use: No  Sexual activity: Not Currently  Social History Narrative  Single. Not working. Financial barriers to care   Review of Systems:  A comprehensive 14 point ROS was performed, reviewed, and the pertinent orthopaedic findings are documented in the HPI.  Physical Exam: Vitals:  06/27/21 1423  BP: 126/84  Weight: 69.6 kg (153 lb 6.4 oz)  Height: 154.9 cm ('5\' 1"'$ )  PainSc: 7  PainLoc: Shoulder   General/Constitutional: The patient appears to be well-nourished, well-developed, and in no acute distress. Neuro/Psych: Normal mood and affect, oriented to person, place and time. Eyes: Non-icteric. Pupils are equal, round, and reactive to  light, and exhibit synchronous movement. ENT: Unremarkable. Lymphatic: No palpable adenopathy. Respiratory: Lungs clear to auscultation, Normal chest excursion, No wheezes, and Non-labored breathing Cardiovascular: Regular rate and rhythm. No murmurs. and No edema, swelling or tenderness, except as noted in detailed exam. Integumentary: No impressive skin lesions present, except as  noted in detailed exam. Musculoskeletal: Unremarkable, except as noted in detailed exam.  Right shoulder exam: SKIN: Normal SWELLING: None WARMTH: None LYMPH NODES: No adenopathy palpable CREPITUS: None TENDERNESS: Mild tenderness diffusely over anterior and lateral aspects of shoulder, but no tenderness over AC joint ROM (active):  Forward flexion: 155 degrees Abduction: 150 degrees Internal rotation: L1 ROM (passive):  Forward flexion: 160 degrees Abduction: 155 degrees  ER/IR at 90 abd: 90 degrees / 70 degrees  She experiences mild pain at the extremes of all motions.  STRENGTH: Forward flexion: 4+/5 Abduction: 4+/5 External rotation: 4+/5 Internal rotation: 4+/5 Pain with RC testing: Mild pain with resisted forward flexion and abduction  STABILITY: Normal  SPECIAL TESTS: Luan Pulling' test: Mildly positive Speed's test: Mildly positive Capsulitis - pain w/ passive ER: No Crossed arm test: Minimally positive Crank: Not evaluated Anterior apprehension: Negative Posterior apprehension: Not evaluated  She remains neurovascularly intact to the right upper extremity.  X-rays/MRI/Lab data:  A recent MRI scan of the right shoulder is available for review. By report, the study demonstrates evidence of an articular sided partial-thickness tear of the supraspinatus tendon with mild degenerative changes of the glenohumeral joint and more moderate degenerative changes of the [facility] joint. The remainder of the rotator cuff is in satisfactory condition. Both the films and report were reviewed by myself and discussed with the patient.  Assessment: 1. Rotator cuff tendinitis, right.  2. Nontraumatic incomplete tear of right rotator cuff.   Plan: The treatment options were discussed with the patient. In addition, patient educational materials were provided regarding the diagnosis and treatment options. The patient is quite frustrated by her persistent symptoms and functional limitations, and  is ready to consider more aggressive treatment options. Therefore, I have recommended a surgical procedure, specifically a right shoulder arthroscopy with debridement, decompression, rotator cuff repair, and probable biceps tenodesis. The procedure was discussed with the patient, as were the potential risks (including bleeding, infection, nerve and/or blood vessel injury, persistent or recurrent pain, failure of the repair, progression of arthritis, need for further surgery, blood clots, strokes, heart attacks and/or arhythmias, pneumonia, etc.) and benefits. The patient states her understanding and wishes to proceed. All of the patient's questions and concerns were answered. She can call any time with further concerns. She will follow up post-surgery, routine.   H&P reviewed and patient re-examined. No changes.

## 2021-07-12 NOTE — Discharge Instructions (Addendum)
Orthopedic discharge instructions: Keep dressing dry and intact.  May shower after dressing changed on post-op day #4 (Saturday).  Cover staples with Band-Aids after drying off. Apply ice frequently to shoulder. Take ibuprofen 600-800 mg TID with meals for 7-10 days, then as necessary. Take oxycodone as prescribed when needed.  May supplement with ES Tylenol if necessary. Keep shoulder immobilizer on at all times except may remove for bathing purposes. Follow-up in 10-14 days or as scheduled.AMBULATORY SURGERY  DISCHARGE INSTRUCTIONS   The drugs that you were given will stay in your system until tomorrow so for the next 24 hours you should not:  Drive an automobile Make any legal decisions Drink any alcoholic beverage   You may resume regular meals tomorrow.  Today it is better to start with liquids and gradually work up to solid foods.  You may eat anything you prefer, but it is better to start with liquids, then soup and crackers, and gradually work up to solid foods.   Please notify your doctor immediately if you have any unusual bleeding, trouble breathing, redness and pain at the surgery site, drainage, fever, or pain not relieved by medication.    Additional Instructions:        Please contact your physician with any problems or Same Day Surgery at [phone], Monday through Friday 6 am to 4 pm, or [place] at [facility] number at [phone].

## 2021-07-12 NOTE — Transfer of Care (Signed)
Immediate Anesthesia Transfer of Care Note  Patient: AMEAH CHANDA  Procedure(s) Performed: RIGHT SHOULDER ARTHROSCOPY WITH DEBRIDEMENT, DECOMPRESSION, ROTATOR CUFF REPAIR, AND  BICEPS TENODESIS. (Right: Shoulder)  Patient Location: PACU  Anesthesia Type:General  Level of Consciousness: awake, alert  and confused  Airway & Oxygen Therapy: Patient Spontanous Breathing  Post-op Assessment: Report given to RN and Post -op Vital signs reviewed and stable  Post vital signs: stable  Last Vitals:  Vitals Value Taken Time  BP 153/83 07/12/21 0949  Temp    Pulse 85 07/12/21 0956  Resp 23 07/12/21 0956  SpO2 94 % 07/12/21 0956  Vitals shown include unvalidated device data.  Last Pain:  Vitals:   07/12/21 0623  TempSrc: Temporal  PainSc: 7          Complications: No notable events documented.

## 2021-07-13 NOTE — Progress Notes (Signed)
Patient upset someone asked her about doing cocaine, states she has never done it and didn't know where that came from , I looked in history and didn't see it and I assured her I didn't see that in her chart.

## 2021-07-18 NOTE — Progress Notes (Signed)
This is a chronic rotator cuff tear.  Thanks!

## 2022-03-26 LAB — COLOGUARD: COLOGUARD: NEGATIVE

## 2022-06-01 ENCOUNTER — Other Ambulatory Visit: Payer: Self-pay | Admitting: Family Medicine

## 2022-06-01 DIAGNOSIS — Z1231 Encounter for screening mammogram for malignant neoplasm of breast: Secondary | ICD-10-CM

## 2022-08-03 ENCOUNTER — Ambulatory Visit
Admission: RE | Admit: 2022-08-03 | Discharge: 2022-08-03 | Disposition: A | Discharge status: Home / Self Care | Payer: 59 | Source: Ambulatory Visit | Attending: Family Medicine | Admitting: Family Medicine

## 2022-08-03 DIAGNOSIS — Z1231 Encounter for screening mammogram for malignant neoplasm of breast: Secondary | ICD-10-CM | POA: Insufficient documentation

## 2022-08-16 ENCOUNTER — Other Ambulatory Visit: Payer: Self-pay | Admitting: Family Medicine

## 2022-08-16 DIAGNOSIS — Z13820 Encounter for screening for osteoporosis: Secondary | ICD-10-CM

## 2023-01-12 ENCOUNTER — Encounter (INDEPENDENT_AMBULATORY_CARE_PROVIDER_SITE_OTHER): Payer: 59 | Admitting: Nurse Practitioner

## 2023-01-12 ENCOUNTER — Encounter (INDEPENDENT_AMBULATORY_CARE_PROVIDER_SITE_OTHER): Payer: Medicare Other

## 2023-02-14 ENCOUNTER — Other Ambulatory Visit (INDEPENDENT_AMBULATORY_CARE_PROVIDER_SITE_OTHER): Payer: Self-pay | Admitting: Nurse Practitioner

## 2023-02-14 DIAGNOSIS — M79605 Pain in left leg: Secondary | ICD-10-CM

## 2023-02-16 ENCOUNTER — Encounter (INDEPENDENT_AMBULATORY_CARE_PROVIDER_SITE_OTHER): Payer: Medicare Other

## 2023-02-16 ENCOUNTER — Encounter (INDEPENDENT_AMBULATORY_CARE_PROVIDER_SITE_OTHER): Payer: 59 | Admitting: Nurse Practitioner

## 2023-03-18 ENCOUNTER — Other Ambulatory Visit: Payer: Self-pay

## 2023-03-18 ENCOUNTER — Inpatient Hospital Stay
Admission: EM | Admit: 2023-03-18 | Discharge: 2023-03-20 | DRG: 872 | Disposition: A | Discharge status: Home / Self Care | Attending: Internal Medicine | Admitting: Internal Medicine

## 2023-03-18 ENCOUNTER — Emergency Department

## 2023-03-18 DIAGNOSIS — Z23 Encounter for immunization: Secondary | ICD-10-CM

## 2023-03-18 DIAGNOSIS — M810 Age-related osteoporosis without current pathological fracture: Secondary | ICD-10-CM | POA: Diagnosis present

## 2023-03-18 DIAGNOSIS — Z885 Allergy status to narcotic agent status: Secondary | ICD-10-CM

## 2023-03-18 DIAGNOSIS — Z853 Personal history of malignant neoplasm of breast: Secondary | ICD-10-CM | POA: Diagnosis not present

## 2023-03-18 DIAGNOSIS — F32A Depression, unspecified: Secondary | ICD-10-CM | POA: Diagnosis not present

## 2023-03-18 DIAGNOSIS — Z720 Tobacco use: Secondary | ICD-10-CM

## 2023-03-18 DIAGNOSIS — M48061 Spinal stenosis, lumbar region without neurogenic claudication: Secondary | ICD-10-CM | POA: Diagnosis present

## 2023-03-18 DIAGNOSIS — Z7969 Long term (current) use of other immunomodulators and immunosuppressants: Secondary | ICD-10-CM | POA: Diagnosis not present

## 2023-03-18 DIAGNOSIS — Z83438 Family history of other disorder of lipoprotein metabolism and other lipidemia: Secondary | ICD-10-CM

## 2023-03-18 DIAGNOSIS — L089 Local infection of the skin and subcutaneous tissue, unspecified: Secondary | ICD-10-CM

## 2023-03-18 DIAGNOSIS — Z833 Family history of diabetes mellitus: Secondary | ICD-10-CM

## 2023-03-18 DIAGNOSIS — F39 Unspecified mood [affective] disorder: Secondary | ICD-10-CM | POA: Diagnosis present

## 2023-03-18 DIAGNOSIS — M459 Ankylosing spondylitis of unspecified sites in spine: Secondary | ICD-10-CM | POA: Diagnosis present

## 2023-03-18 DIAGNOSIS — D84821 Immunodeficiency due to drugs: Secondary | ICD-10-CM | POA: Diagnosis present

## 2023-03-18 DIAGNOSIS — Z923 Personal history of irradiation: Secondary | ICD-10-CM

## 2023-03-18 DIAGNOSIS — E876 Hypokalemia: Secondary | ICD-10-CM | POA: Diagnosis present

## 2023-03-18 DIAGNOSIS — Z9221 Personal history of antineoplastic chemotherapy: Secondary | ICD-10-CM | POA: Diagnosis not present

## 2023-03-18 DIAGNOSIS — A419 Sepsis, unspecified organism: Principal | ICD-10-CM | POA: Diagnosis present

## 2023-03-18 DIAGNOSIS — W5501XA Bitten by cat, initial encounter: Secondary | ICD-10-CM | POA: Diagnosis not present

## 2023-03-18 DIAGNOSIS — Z8261 Family history of arthritis: Secondary | ICD-10-CM

## 2023-03-18 DIAGNOSIS — E785 Hyperlipidemia, unspecified: Secondary | ICD-10-CM | POA: Diagnosis present

## 2023-03-18 DIAGNOSIS — F17210 Nicotine dependence, cigarettes, uncomplicated: Secondary | ICD-10-CM | POA: Diagnosis present

## 2023-03-18 DIAGNOSIS — Z79899 Other long term (current) drug therapy: Secondary | ICD-10-CM

## 2023-03-18 DIAGNOSIS — L0390 Cellulitis, unspecified: Secondary | ICD-10-CM | POA: Diagnosis present

## 2023-03-18 DIAGNOSIS — R6520 Severe sepsis without septic shock: Secondary | ICD-10-CM | POA: Diagnosis present

## 2023-03-18 DIAGNOSIS — S61452A Open bite of left hand, initial encounter: Secondary | ICD-10-CM | POA: Diagnosis present

## 2023-03-18 DIAGNOSIS — Z8262 Family history of osteoporosis: Secondary | ICD-10-CM

## 2023-03-18 DIAGNOSIS — Z716 Tobacco abuse counseling: Secondary | ICD-10-CM

## 2023-03-18 DIAGNOSIS — E66811 Obesity, class 1: Secondary | ICD-10-CM | POA: Diagnosis present

## 2023-03-18 DIAGNOSIS — I10 Essential (primary) hypertension: Secondary | ICD-10-CM | POA: Diagnosis present

## 2023-03-18 DIAGNOSIS — Z8249 Family history of ischemic heart disease and other diseases of the circulatory system: Secondary | ICD-10-CM

## 2023-03-18 DIAGNOSIS — Z6834 Body mass index (BMI) 34.0-34.9, adult: Secondary | ICD-10-CM

## 2023-03-18 DIAGNOSIS — L03114 Cellulitis of left upper limb: Secondary | ICD-10-CM | POA: Diagnosis present

## 2023-03-18 DIAGNOSIS — Z823 Family history of stroke: Secondary | ICD-10-CM

## 2023-03-18 DIAGNOSIS — Z880 Allergy status to penicillin: Secondary | ICD-10-CM | POA: Diagnosis not present

## 2023-03-18 LAB — CBC WITH DIFFERENTIAL/PLATELET
Abs Immature Granulocytes: 0.07 10*3/uL (ref 0.00–0.07)
Basophils Absolute: 0 10*3/uL (ref 0.0–0.1)
Basophils Relative: 0 %
Eosinophils Absolute: 0.1 10*3/uL (ref 0.0–0.5)
Eosinophils Relative: 1 %
HCT: 39.5 % (ref 36.0–46.0)
Hemoglobin: 13.2 g/dL (ref 12.0–15.0)
Immature Granulocytes: 0 %
Lymphocytes Relative: 13 %
Lymphs Abs: 2.2 10*3/uL (ref 0.7–4.0)
MCH: 31.7 pg (ref 26.0–34.0)
MCHC: 33.4 g/dL (ref 30.0–36.0)
MCV: 95 fL (ref 80.0–100.0)
Monocytes Absolute: 0.8 10*3/uL (ref 0.1–1.0)
Monocytes Relative: 5 %
Neutro Abs: 13.6 10*3/uL — ABNORMAL HIGH (ref 1.7–7.7)
Neutrophils Relative %: 81 %
Platelets: 176 10*3/uL (ref 150–400)
RBC: 4.16 MIL/uL (ref 3.87–5.11)
RDW: 14.3 % (ref 11.5–15.5)
WBC: 16.8 10*3/uL — ABNORMAL HIGH (ref 4.0–10.5)
nRBC: 0 % (ref 0.0–0.2)

## 2023-03-18 LAB — COMPREHENSIVE METABOLIC PANEL
ALT: 17 U/L (ref 0–44)
AST: 24 U/L (ref 15–41)
Albumin: 3.7 g/dL (ref 3.5–5.0)
Alkaline Phosphatase: 55 U/L (ref 38–126)
Anion gap: 9 (ref 5–15)
BUN: 15 mg/dL (ref 8–23)
CO2: 21 mmol/L — ABNORMAL LOW (ref 22–32)
Calcium: 8.4 mg/dL — ABNORMAL LOW (ref 8.9–10.3)
Chloride: 104 mmol/L (ref 98–111)
Creatinine, Ser: 0.66 mg/dL (ref 0.44–1.00)
GFR, Estimated: >60 mL/min (ref >60)
Glucose, Bld: 121 mg/dL — ABNORMAL HIGH (ref 70–99)
Potassium: 3 mmol/L — ABNORMAL LOW (ref 3.5–5.1)
Sodium: 134 mmol/L — ABNORMAL LOW (ref 135–145)
Total Bilirubin: 0.8 mg/dL (ref 0.0–1.2)
Total Protein: 7.3 g/dL (ref 6.5–8.1)

## 2023-03-18 LAB — SEDIMENTATION RATE: Sed Rate: 27 mm/h (ref 0–30)

## 2023-03-18 LAB — LACTIC ACID, PLASMA
Lactic Acid, Venous: 2.5 mmol/L (ref 0.5–1.9)
Lactic Acid, Venous: 1.6 mmol/L (ref 0.5–1.9)

## 2023-03-18 MED ORDER — ONDANSETRON HCL 4 MG/2ML IJ SOLN: 4.0000 mg | Freq: Four times a day (QID) | INTRAMUSCULAR | Status: DC | PRN

## 2023-03-18 MED ORDER — ONDANSETRON HCL 4 MG/2ML IJ SOLN
4.0000 mg | Freq: Once | INTRAMUSCULAR | Status: AC
  Administered 2023-03-18: 4 mg via INTRAVENOUS
  Filled 2023-03-18: qty 2

## 2023-03-18 MED ORDER — LORAZEPAM 2 MG/ML IJ SOLN
0.5000 mg | Freq: Once | INTRAMUSCULAR | Status: AC
  Administered 2023-03-18: 0.5 mg via INTRAVENOUS
  Filled 2023-03-18: qty 1

## 2023-03-18 MED ORDER — IOHEXOL 300 MG/ML  SOLN
80.0000 mL | Freq: Once | INTRAMUSCULAR | Status: AC | PRN
  Administered 2023-03-18: 80 mL via INTRAVENOUS

## 2023-03-18 MED ORDER — TETANUS-DIPHTH-ACELL PERTUSSIS 5-2.5-18.5 LF-MCG/0.5 IM SUSY
0.5000 mL | PREFILLED_SYRINGE | Freq: Once | INTRAMUSCULAR | Status: AC
  Administered 2023-03-18: 0.5 mL via INTRAMUSCULAR
  Filled 2023-03-18: qty 0.5

## 2023-03-18 MED ORDER — ONDANSETRON HCL 4 MG PO TABS
4.0000 mg | ORAL_TABLET | Freq: Four times a day (QID) | ORAL | Status: DC | PRN
  Administered 2023-03-18: 4 mg via ORAL
  Filled 2023-03-18: qty 1

## 2023-03-18 MED ORDER — VANCOMYCIN HCL 1500 MG/300ML IV SOLN
1500.0000 mg | Freq: Once | INTRAVENOUS | Status: AC
  Administered 2023-03-18: 1500 mg via INTRAVENOUS
  Filled 2023-03-18: qty 300

## 2023-03-18 MED ORDER — OXYCODONE-ACETAMINOPHEN 7.5-325 MG PO TABS
1.0000 | ORAL_TABLET | Freq: Four times a day (QID) | ORAL | Status: DC | PRN
  Administered 2023-03-18 – 2023-03-20 (×6): 1 via ORAL
  Filled 2023-03-18 (×6): qty 1

## 2023-03-18 MED ORDER — PIPERACILLIN-TAZOBACTAM 3.375 G IVPB
3.3750 g | Freq: Three times a day (TID) | INTRAVENOUS | Status: DC
  Administered 2023-03-19 – 2023-03-20 (×5): 3.375 g via INTRAVENOUS
  Filled 2023-03-18 (×5): qty 50

## 2023-03-18 MED ORDER — PIPERACILLIN-TAZOBACTAM 3.375 G IVPB 30 MIN
3.3750 g | Freq: Once | INTRAVENOUS | Status: AC
  Administered 2023-03-18: 3.375 g via INTRAVENOUS
  Filled 2023-03-18: qty 50

## 2023-03-18 MED ORDER — LACTATED RINGERS IV SOLN
INTRAVENOUS | Status: AC
Start: 2023-03-18 — End: 2023-03-19

## 2023-03-18 MED ORDER — OXYCODONE-ACETAMINOPHEN 7.5-325 MG PO TABS: 1.0000 | ORAL_TABLET | Freq: Four times a day (QID) | ORAL | Status: DC | PRN

## 2023-03-18 MED ORDER — FLUOXETINE HCL 20 MG PO CAPS
20.0000 mg | ORAL_CAPSULE | Freq: Every day | ORAL | Status: DC
  Administered 2023-03-18 – 2023-03-20 (×3): 20 mg via ORAL
  Filled 2023-03-18 (×3): qty 1

## 2023-03-18 MED ORDER — VANCOMYCIN HCL IN DEXTROSE 1-5 GM/200ML-% IV SOLN: 1000.0000 mg | INTRAVENOUS | Status: DC

## 2023-03-18 MED ORDER — ENOXAPARIN SODIUM 40 MG/0.4ML IJ SOSY
40.0000 mg | PREFILLED_SYRINGE | INTRAMUSCULAR | Status: DC
  Administered 2023-03-19 (×2): 40 mg via SUBCUTANEOUS
  Filled 2023-03-18 (×2): qty 0.4

## 2023-03-18 MED ORDER — LORAZEPAM 1 MG PO TABS
1.0000 mg | ORAL_TABLET | Freq: Four times a day (QID) | ORAL | Status: DC | PRN
  Administered 2023-03-20: 1 mg via ORAL
  Filled 2023-03-18: qty 1

## 2023-03-18 MED ORDER — MORPHINE SULFATE (PF) 4 MG/ML IV SOLN
4.0000 mg | Freq: Once | INTRAVENOUS | Status: AC
  Administered 2023-03-18: 4 mg via INTRAVENOUS
  Filled 2023-03-18: qty 1

## 2023-03-18 MED ORDER — HYDROMORPHONE HCL 1 MG/ML IJ SOLN
1.0000 mg | INTRAMUSCULAR | Status: DC | PRN
  Administered 2023-03-19 – 2023-03-20 (×4): 1 mg via INTRAVENOUS
  Filled 2023-03-18 (×4): qty 1

## 2023-03-18 NOTE — Assessment & Plan Note (Signed)
 Con't prozac

## 2023-03-18 NOTE — Assessment & Plan Note (Signed)
 Left hand redness and swelling status post cat bite CT imaging negative for any deep tissue abscess or intra-articular infection.  Blood cultures  IV vanc and zosyn for infectious coverage  Pain control  Consult orthopedic surgery as clinically indicated  Follow

## 2023-03-18 NOTE — ED Provider Notes (Signed)
 [facility] Provider Note    Event Date/Time   First MD Initiated Contact with Patient 03/18/23 1340     (approximate)   History   Animal Bite   HPI  Janet Zuniga is a 68 y.o. female with a history of ankylosing spondylitis, anxiety, depression who is on Enbrel injections who presents after a cat bite to her left hand 2 days ago.  She reports swelling and pain, does not think that she has had a fever     Physical Exam   Triage Vital Signs: ED Triage Vitals  Encounter Vitals Group     BP 03/18/23 1203 (!) 145/88     Systolic BP Percentile --      Diastolic BP Percentile --      Pulse Rate 03/18/23 1201 99     Resp 03/18/23 1201 17     Temp 03/18/23 1201 98.6 F (37 C)     Temp Source 03/18/23 1201 Oral     SpO2 03/18/23 1201 95 %     Weight 03/18/23 1202 74.8 kg (165 lb)     Height 03/18/23 1202 1.499 m (4\' 11" )     Head Circumference --      Peak Flow --      Pain Score 03/18/23 1202 8     Pain Loc --      Pain Education --      Exclude from Growth Chart --     Most recent vital signs: Vitals:   03/18/23 1201 03/18/23 1203  BP:  (!) 145/88  Pulse: 99   Resp: 17   Temp: 98.6 F (37 C)   SpO2: 95%      General: Awake, no distress.  CV:  Good peripheral perfusion.  Resp:  Normal effort.  Abd:  No distention.  Other:  Significant swelling to the palm of the left hand, with redness streaking up the left forearm consistent with cellulitis   ED Results / Procedures / Treatments   Labs (all labs ordered are listed, but only abnormal results are displayed) Labs Reviewed  CBC WITH DIFFERENTIAL/PLATELET - Abnormal; Notable for the following components:      Result Value   WBC 16.8 (*)    Neutro Abs 13.6 (*)    All other components within normal limits  COMPREHENSIVE METABOLIC PANEL - Abnormal; Notable for the following components:   Sodium 134 (*)    Potassium 3.0 (*)    CO2 21 (*)    Glucose, Bld 121 (*)    Calcium 8.4 (*)     All other components within normal limits  LACTIC ACID, PLASMA - Abnormal; Notable for the following components:   Lactic Acid, Venous 2.5 (*)    All other components within normal limits  CULTURE, BLOOD (ROUTINE X 2)  CULTURE, BLOOD (ROUTINE X 2)  SEDIMENTATION RATE  LACTIC ACID, PLASMA  HIV ANTIBODY (ROUTINE TESTING W REFLEX)     EKG     RADIOLOGY     PROCEDURES:  Critical Care performed: yes  CRITICAL CARE Performed by: Jene Every   Total critical care time: 30 minutes  Critical care time was exclusive of separately billable procedures and treating other patients.  Critical care was necessary to treat or prevent imminent or life-threatening deterioration.  Critical care was time spent personally by me on the following activities: development of treatment plan with patient and/or surrogate as well as nursing, discussions with consultants, evaluation of patient's response to treatment, examination of  patient, obtaining history from patient or surrogate, ordering and performing treatments and interventions, ordering and review of laboratory studies, ordering and review of radiographic studies, pulse oximetry and re-evaluation of patient's condition.   Procedures   MEDICATIONS ORDERED IN ED: Medications  lactated ringers infusion ( Intravenous New Bag/Given 03/18/23 1422)  enoxaparin (LOVENOX) injection 40 mg (has no administration in time range)  ondansetron (ZOFRAN) tablet 4 mg (has no administration in time range)    Or  ondansetron (ZOFRAN) injection 4 mg (has no administration in time range)  FLUoxetine (PROZAC) capsule 20 mg (has no administration in time range)  piperacillin-tazobactam (ZOSYN) IVPB 3.375 g (has no administration in time range)  vancomycin (VANCOREADY) IVPB 1500 mg/300 mL (has no administration in time range)    Followed by  vancomycin (VANCOCIN) IVPB 1000 mg/200 mL premix (has no administration in time range)  Tdap (BOOSTRIX) injection  0.5 mL (has no administration in time range)  piperacillin-tazobactam (ZOSYN) IVPB 3.375 g (3.375 g Intravenous New Bag/Given 03/18/23 1425)  morphine (PF) 4 MG/ML injection 4 mg (4 mg Intravenous Given 03/18/23 1425)  ondansetron (ZOFRAN) injection 4 mg (4 mg Intravenous Given 03/18/23 1424)  iohexol (OMNIPAQUE) 300 MG/ML solution 80 mL (80 mLs Intravenous Contrast Given 03/18/23 1447)  LORazepam (ATIVAN) injection 0.5 mg (0.5 mg Intravenous Given 03/18/23 1514)     IMPRESSION / MDM / ASSESSMENT AND PLAN / ED COURSE  I reviewed the triage vital signs and the nursing notes. Patient's presentation is most consistent with acute presentation with potential threat to life or bodily function.   Patient presents with signs of cellulitis/wound infection secondary to cat bite.  She is tachycardic  Patient's lactic acid is elevated at 2.5, her white blood cell count is elevated at 16.8 consistent with sepsis.  Blood cultures ordered, will treat with IV Zosyn given relatively minor penicillin allergy related to a rash when she was 68 years old.  Will admit to the hospitalist team  Hospitalist is requesting CT hand prior to admission     FINAL CLINICAL IMPRESSION(S) / ED DIAGNOSES   Final diagnoses:  Cat bite, initial encounter  Wound infection  Sepsis, due to unspecified organism, unspecified whether acute organ dysfunction present [facility])     Rx / DC Orders   ED Discharge Orders     None        Note:  This document was prepared using Dragon voice recognition software and may include unintentional dictation errors.   Jene Every, MD 03/18/23 [phone]

## 2023-03-18 NOTE — ED Triage Notes (Addendum)
 Pt adopted a cat from the [employer]ane [facility] Friday, and the cat bit her Friday night on her L hand. The [employer]ane [employer] came and retrieved the cat. Pt says she has been using peroxide to clean the wound. Left hand is swelling with some erythema. Bite wounds have scabs and no drainage. Cat was up to date with all vaccinations.

## 2023-03-18 NOTE — Assessment & Plan Note (Signed)
1 PPD smoker  Discussed cessation  Nicotine patch   

## 2023-03-18 NOTE — H&P (Signed)
 History and Physical    Patient: Janet Zuniga NWG:[id number] DOB: 06/26/1955 DOA: 03/18/2023 DOS: the patient was seen and examined on 03/18/2023 PCP: Armando Gang, FNP  Patient coming from: Home  Chief Complaint:  Chief Complaint  Patient presents with   Animal Bite   HPI: MARYLYNNE Zuniga is a 68 y.o. female with medical history significant of hyperlipidemia, mood disorder, tobacco abuse presenting with animal bite, left hand cellulitis.  Patient reports recently adopting a cat for her other pet.  [name] came from a local shelter.  Reports attempted to pet the cat when the cat aggressively bit her on the left hand between the thumb and index finger predominately over the base of the index finger.  Per the patient, the cat aggressively bit down and was very difficult to remove despite aggressive shaking.  Had progressive onset of left hand redness swelling and pain since the onset yesterday.  Mild malaise.  No reported fevers or chills.  Significant pain and difficulty with flexing and extension of the hand. Presented to the ER afebrile, hemodynamically stable.  Satting well on room air.  White count 17, hemoglobin 13.2, platelets 176, lactate 2.5-1.6.  Creatinine 0.66.  Potassium 3.0.  Sed rate 27.  CT of the left hand showing subcutaneous fat stranding throughout the dorsum of the hand consistent with cellulitis without any gross findings for abscess or intra-articular involvement. Review of Systems: As mentioned in the history of present illness. All other systems reviewed and are negative. Past Medical History:  Diagnosis Date   Ankylosing spondylitis (HCC)    Anxiety    Arthritis    Blood in stool    Breast cancer (HCC) 2010   Left breast- chemo/radiation   Chronic low back pain    Chronic pain    Depression    Headache    Hepatitis C 2007   Hip fracture (HCC)    History of blood transfusion    Hypertension    Insomnia    OA (osteoarthritis)    Osteoporosis    Personal history  of chemotherapy    Personal history of radiation therapy    Spinal stenosis of lumbar region    Spondyloarthritis    Past Surgical History:  Procedure Laterality Date   BREAST BIOPSY Right 1983   neg   BREAST EXCISIONAL BIOPSY Left 2010   triple neg   BREAST LUMPECTOMY Left 2010   FEMUR FRACTURE SURGERY     FOOT SURGERY  2000 and 2003   FRACTURE SURGERY     INTRAMEDULLARY (IM) NAIL INTERTROCHANTERIC Right 01/12/2015   Procedure: INTRAMEDULLARY (IM) NAIL INTERTROCHANTRIC;  Surgeon: Deeann Saint, MD;  Location: [facility] OR[facility];  Service: Orthopedics;  Laterality: Right;   KNEE SURGERY Left 2004   SHOULDER ARTHROSCOPY WITH SUBACROMIAL DECOMPRESSION, ROTATOR CUFF REPAIR AND BICEP TENDON REPAIR Right 07/12/2021   Procedure: RIGHT SHOULDER ARTHROSCOPY WITH DEBRIDEMENT, DECOMPRESSION, ROTATOR CUFF REPAIR, AND  BICEPS TENODESIS.;  Surgeon: Christena Flake, MD;  Location: [facility] OR[facility];  Service: Orthopedics;  Laterality: Right;   TUBAL LIGATION     Social History:  reports that she has been smoking cigarettes. She has a 5 pack-year smoking history. She has never used smokeless tobacco. She reports current alcohol use of about 1.0 standard drink of alcohol per week. She reports that she does not currently use drugs after having used the following drugs: Marijuana.  Allergies  Allergen Reactions   Tramadol Nausea Only   Penicillins Rash and Other (See Comments)  Has patient had a PCN reaction causing immediate rash, facial/tongue/throat swelling, SOB or lightheadedness with hypotension: Yes Has patient had a PCN reaction causing severe rash involving mucus membranes or skin necrosis: No Has patient had a PCN reaction that required hospitalization No Has patient had a PCN reaction occurring within the last 10 years: No If all of the above answers are "NO", then may proceed with Cephalosporin use.     Family History  Problem Relation Age of Onset   Arthritis Mother    Stroke Mother    Heart disease  Mother    Hypertension Mother    Hyperlipidemia Mother    Depression Mother    Seizures Mother    Osteoporosis Mother    Hypertension Father    Seizures Sister    Stroke Sister    Heart disease Maternal Aunt    Hypertension Brother    Arthritis Brother    Depression Brother    Diabetes Brother    Hyperlipidemia Brother    COPD Neg Hx    Cancer Neg Hx     Prior to Admission medications   Medication Sig Start Date End Date Taking? Authorizing Provider  ALPRAZolam Prudy Feeler) 1 MG tablet Take 1 mg by mouth 3 (three) times daily as needed for anxiety. 05/20/20  Yes [provider]  busPIRone (BUSPAR) 30 MG tablet Take 1 tablet by mouth 2 (two) times daily as needed. 07/18/17  Yes [provider]  etanercept (ENBREL) 50 MG/ML injection Inject 1 mL into the skin once a week. 07/10/18  Yes [provider]  FLUoxetine (PROZAC) 20 MG capsule Take 20 mg by mouth daily. 01/15/23  Yes [provider]  rosuvastatin (CRESTOR) 20 MG tablet Take 20 mg by mouth every other day. Pt does not take this med as prescribed Patient not taking: Reported on 03/18/2023    [provider]    Physical Exam: Vitals:   03/18/23 1201 03/18/23 1202 03/18/23 1203  BP:   (!) 145/88  Pulse: 99    Resp: 17    Temp: 98.6 F (37 C)    TempSrc: Oral    SpO2: 95%    Weight:  74.8 kg   Height:  4\' 11"  (1.499 m)    Physical Exam Constitutional:      Appearance: She is obese.  HENT:     Head: Normocephalic and atraumatic.     Nose: Nose normal.     Mouth/Throat:     Mouth: Mucous membranes are moist.  Eyes:     Pupils: Pupils are equal, round, and reactive to light.  Cardiovascular:     Rate and Rhythm: Normal rate and regular rhythm.  Pulmonary:     Effort: Pulmonary effort is normal.  Abdominal:     General: Bowel sounds are normal.  Musculoskeletal:        General: Normal range of motion.  Skin:    General: Skin is warm.  Neurological:     General: No focal  deficit present.  Psychiatric:        Mood and Affect: Mood normal.     Data Reviewed:  There are no new results to review at this time.  CT HAND LEFT W CONTRAST CLINICAL DATA:  Cat bite on Friday, swelling and erythema  EXAM: CT OF THE UPPER LEFT EXTREMITY WITH CONTRAST  TECHNIQUE: Multidetector CT imaging of the upper left extremity was performed according to the standard protocol following intravenous contrast administration.  RADIATION DOSE REDUCTION: This exam was  performed according to the departmental dose-optimization program which includes automated exposure control, adjustment of the mA and/or kV according to patient size and/or use of iterative reconstruction technique.  CONTRAST:  80mL OMNIPAQUE IOHEXOL 300 MG/ML  SOLN  COMPARISON:  None Available.  FINDINGS: Bones/Joint/Cartilage  There are no acute or destructive bony abnormalities. Mild diffuse osteoarthritis throughout the wrist and hand, greatest throughout the interphalangeal joints.  Ligaments  Suboptimally assessed by CT.  Muscles and Tendons  No gross abnormality by CT.  Soft tissues  There is subcutaneous fat stranding throughout the dorsum of the hand, with no evidence of fluid collection or abscess. No subcutaneous gas or radiopaque foreign bodies.  Reconstructed images demonstrate no additional findings.  IMPRESSION: 1. Subcutaneous fat stranding throughout the dorsum of the hand consistent with cellulitis. No fluid collection or abscess. No subcutaneous gas or radiopaque foreign body. 2. Mild diffuse osteoarthritis. No acute or destructive bony abnormality.  Electronically Signed   By: Sharlet Salina M.D.   On: 03/18/2023 15:08  Lab Results  Component Value Date   WBC 16.8 (H) 03/18/2023   HGB 13.2 03/18/2023   HCT 39.5 03/18/2023   MCV 95.0 03/18/2023   PLT 176 03/18/2023   Last metabolic panel Lab Results  Component Value Date   GLUCOSE 121 (H) 03/18/2023   NA 134  (L) 03/18/2023   K 3.0 (L) 03/18/2023   CL 104 03/18/2023   CO2 21 (L) 03/18/2023   BUN 15 03/18/2023   CREATININE 0.66 03/18/2023   GFRNONAA >60 03/18/2023   CALCIUM 8.4 (L) 03/18/2023   PROT 7.3 03/18/2023   ALBUMIN 3.7 03/18/2023   LABGLOB 3.3 04/25/2017   AGRATIO 1.3 04/25/2017   BILITOT 0.8 03/18/2023   ALKPHOS 55 03/18/2023   AST 24 03/18/2023   ALT 17 03/18/2023   ANIONGAP 9 03/18/2023    Assessment and Plan: Cat bite Left hand redness and swelling status post cat bite CT imaging negative for any deep tissue abscess or intra-articular infection.  Blood cultures  IV vanc and zosyn for infectious coverage  Pain control  Consult orthopedic surgery as clinically indicated  Follow      Tobacco abuse 1 PPD smoker  Discussed cessation  Nicotine patch    Mood disorder (HCC) Cont prozac        Advance Care Planning:   Code Status: Full Code   Consults: None  Family Communication: No family at the bedside   Severity of Illness: The appropriate patient status for this patient is INPATIENT. Inpatient status is judged to be reasonable and necessary in order to provide the required intensity of service to ensure the patient's safety. The patient's presenting symptoms, physical exam findings, and initial radiographic and laboratory data in the context of their chronic comorbidities is felt to place them at high risk for further clinical deterioration. Furthermore, it is not anticipated that the patient will be medically stable for discharge from the hospital within 2 midnights of admission.   * I certify that at the point of admission it is my clinical judgment that the patient will require inpatient hospital care spanning beyond 2 midnights from the point of admission due to high intensity of service, high risk for further deterioration and high frequency of surveillance required.*  Author: Floydene Flock, MD 03/18/2023 4:38 PM  For on call review  [email or web address].

## 2023-03-18 NOTE — Progress Notes (Signed)
 Pharmacy Antibiotic Note  Janet Zuniga is a 68 y.o. female admitted on 03/18/2023 with cellulitis.  Patient presents to ED after a cat bite to her left hand x 2 days ago. Patient reports swelling and pain but no fever. Pharmacy has been consulted for vancomycin and Zosyn dosing.  Antibiotics already given 3/16: Zosyn 3.375 g IV x 1 @ ~1430  Today, 03/18/23: WBC 16.8, elevated Lactic acid 2.5>1.6 Scr 0.66 (baseline) Afebrile  Micro: Bcx IP  Imaging: CT L Hand: Subcutaneous fat stranding throughout the dorsum of the hand consistent with cellulitis. No fluid collection or abscess. No subcutaneous gas or radiopaque foreign body.  Plan: Give vancomycin 1500 mg IV x1 followed by 1000 mg IV Q24H. Goal AUC 400-550. Expected AUC: 421.2 Expected Css min: 10.3 SCr used: 0.8  Weight used: IBW, Vd used: 0.72 (BMI 33.33) Start Zosyn 3.375 g IV Q8H Continue to monitor renal function and follow culture results   Height: 4\' 11"  (149.9 cm) Weight: 74.8 kg (165 lb) IBW/kg (Calculated) : 43.2  Temp (24hrs), Avg:98.6 F (37 C), Min:98.6 F (37 C), Max:98.6 F (37 C)  Recent Labs  Lab 03/18/23 1205 03/18/23 1414  WBC 16.8*  --   CREATININE 0.66  --   LATICACIDVEN 2.5* 1.6    Estimated Creatinine Clearance: 61.8 mL/min (by C-G formula based on SCr of 0.66 mg/dL).    Allergies  Allergen Reactions   Tramadol Nausea Only   Penicillins Rash and Other (See Comments)    Has patient had a PCN reaction causing immediate rash, facial/tongue/throat swelling, SOB or lightheadedness with hypotension: Yes Has patient had a PCN reaction causing severe rash involving mucus membranes or skin necrosis: No Has patient had a PCN reaction that required hospitalization No Has patient had a PCN reaction occurring within the last 10 years: No If all of the above answers are "NO", then may proceed with Cephalosporin use.     Antimicrobials this admission: 3/16 Vanc >>  3/16 Zosyn >>    Microbiology  results: 3/16 BCx: IP   Thank you for allowing pharmacy to be a part of this patient's care.  Merryl Hacker, PharmD Clinical Pharmacist  03/18/2023 4:31 PM

## 2023-03-18 NOTE — Progress Notes (Signed)
 CODE SEPSIS - PHARMACY COMMUNICATION  **Broad Spectrum Antibiotics should be administered within 1 hour of Sepsis diagnosis**  Time Code Sepsis Called/Page Received: 3/16 1356  Antibiotics Ordered:  Zosyn 3.375 g x 1   Time of 1st antibiotic administration: 3/16 1425   Additional action taken by pharmacy: NA  If necessary, Name of Provider/Nurse Contacted: NA  Effie Shy, PharmD Pharmacy Resident  03/18/2023 2:05 PM

## 2023-03-18 NOTE — Sepsis Progress Note (Addendum)
 Elink following code sepsis  1407 Notified provider and bedside nurse of need to order repeat lactic acid.

## 2023-03-19 ENCOUNTER — Encounter: Payer: Self-pay | Admitting: Family Medicine

## 2023-03-19 DIAGNOSIS — L03114 Cellulitis of left upper limb: Secondary | ICD-10-CM

## 2023-03-19 LAB — CBC
HCT: 34.4 % — ABNORMAL LOW (ref 36.0–46.0)
Hemoglobin: 11.4 g/dL — ABNORMAL LOW (ref 12.0–15.0)
MCH: 32 pg (ref 26.0–34.0)
MCHC: 33.1 g/dL (ref 30.0–36.0)
MCV: 96.6 fL (ref 80.0–100.0)
Platelets: 144 K/uL — ABNORMAL LOW (ref 150–400)
RBC: 3.56 MIL/uL — ABNORMAL LOW (ref 3.87–5.11)
RDW: 14.2 % (ref 11.5–15.5)
WBC: 12.7 K/uL — ABNORMAL HIGH (ref 4.0–10.5)
nRBC: 0 % (ref 0.0–0.2)

## 2023-03-19 LAB — COMPREHENSIVE METABOLIC PANEL
ALT: 13 U/L (ref 0–44)
AST: 15 U/L (ref 15–41)
Albumin: 3.3 g/dL — ABNORMAL LOW (ref 3.5–5.0)
Alkaline Phosphatase: 47 U/L (ref 38–126)
Anion gap: 8 (ref 5–15)
BUN: 15 mg/dL (ref 8–23)
CO2: 25 mmol/L (ref 22–32)
Calcium: 8.6 mg/dL — ABNORMAL LOW (ref 8.9–10.3)
Chloride: 102 mmol/L (ref 98–111)
Creatinine, Ser: 0.76 mg/dL (ref 0.44–1.00)
GFR, Estimated: >60 mL/min (ref >60)
Glucose, Bld: 102 mg/dL — ABNORMAL HIGH (ref 70–99)
Potassium: 3.2 mmol/L — ABNORMAL LOW (ref 3.5–5.1)
Sodium: 135 mmol/L (ref 135–145)
Total Bilirubin: 0.8 mg/dL (ref 0.0–1.2)
Total Protein: 6.5 g/dL (ref 6.5–8.1)

## 2023-03-19 LAB — MAGNESIUM: Magnesium: 1.6 mg/dL — ABNORMAL LOW (ref 1.7–2.4)

## 2023-03-19 LAB — HIV ANTIBODY (ROUTINE TESTING W REFLEX): HIV Screen 4th Generation wRfx: NONREACTIVE

## 2023-03-19 MED ORDER — POTASSIUM CHLORIDE CRYS ER 20 MEQ PO TBCR
40.0000 meq | EXTENDED_RELEASE_TABLET | Freq: Once | ORAL | Status: AC
  Administered 2023-03-19: 40 meq via ORAL
  Filled 2023-03-19: qty 2

## 2023-03-19 MED ORDER — MAGNESIUM SULFATE 2 GM/50ML IV SOLN
2.0000 g | Freq: Once | INTRAVENOUS | Status: AC
  Administered 2023-03-19: 2 g via INTRAVENOUS
  Filled 2023-03-19: qty 50

## 2023-03-19 MED ORDER — VANCOMYCIN HCL 1250 MG/250ML IV SOLN
1250.0000 mg | INTRAVENOUS | Status: DC
Start: 2023-03-19 — End: 2023-03-19
  Filled 2023-03-19: qty 250

## 2023-03-19 NOTE — Plan of Care (Signed)

## 2023-03-19 NOTE — Consult Note (Signed)
 ORTHOPAEDIC CONSULTATION  REQUESTING PHYSICIAN: Lurene Shadow, MD  Chief Complaint:   Left hand pain, swelling, and erythema secondary to cat bite.  History of Present Illness: Janet Zuniga is a 68 y.o. female with a history of hypertension, hep C, anxiety/depression, breast cancer, ankylosing spondylitis requiring Enbrel injections, and osteoporosis who lives independently.  The patient was in her usual state of health when she was bit by a cat to the left hand 3 days ago.  The patient had just brought the cat home from the shelter prior to this incident.  She returned the cat to the shelter.  Initially, she managed her wounds with rinsing them with hydrogen peroxide.  However, she developed increasing pain and swelling, so she presented to the emergency room and subsequent was admitted for left hand cellulitis.  A CT scan of the hand was obtained to rule out any abscesses or flexor sheath involvement.  Orthopedics was consulted to assess for any need of surgical intervention.  The patient denies any fevers while at home, nor has she been febrile in the hospital.  However, her white count was elevated at 16.8K and her lactic acid was elevated at 2.5.  The patient does note that her swelling and pain have improved this morning.  In addition, her white count has decreased to 12.7K.  Past Medical History:  Diagnosis Date   Ankylosing spondylitis (HCC)    Anxiety    Arthritis    Blood in stool    Breast cancer (HCC) 2010   Left breast- chemo/radiation   Chronic low back pain    Chronic pain    Depression    Headache    Hepatitis C 2007   Hip fracture (HCC)    History of blood transfusion    Hypertension    Insomnia    OA (osteoarthritis)    Osteoporosis    Personal history of chemotherapy    Personal history of radiation therapy    Spinal stenosis of lumbar region    Spondyloarthritis    Past Surgical History:   Procedure Laterality Date   BREAST BIOPSY Right 1983   neg   BREAST EXCISIONAL BIOPSY Left 2010   triple neg   BREAST LUMPECTOMY Left 2010   FEMUR FRACTURE SURGERY     FOOT SURGERY  2000 and 2003   FRACTURE SURGERY     INTRAMEDULLARY (IM) NAIL INTERTROCHANTERIC Right 01/12/2015   Procedure: INTRAMEDULLARY (IM) NAIL INTERTROCHANTRIC;  Surgeon: Deeann Saint, MD;  Location: [facility];  Service: Orthopedics;  Laterality: Right;   KNEE SURGERY Left 2004   SHOULDER ARTHROSCOPY WITH SUBACROMIAL DECOMPRESSION, ROTATOR CUFF REPAIR AND BICEP TENDON REPAIR Right 07/12/2021   Procedure: RIGHT SHOULDER ARTHROSCOPY WITH DEBRIDEMENT, DECOMPRESSION, ROTATOR CUFF REPAIR, AND  BICEPS TENODESIS.;  Surgeon: Christena Flake, MD;  Location: [facility] OR[facility];  Service: Orthopedics;  Laterality: Right;   TUBAL LIGATION     Social History   Socioeconomic History   Marital status: Legally Separated    Spouse name: Not on file   Number of children: Not on file   Years of education: Not on file   Highest education level: Not on file  Occupational History   Not on file  Tobacco Use   Smoking status: Every Day    Current packs/day: 0.20    Average packs/day: 0.2 packs/day for 25.0 years (5.0 ttl pk-yrs)    Types: Cigarettes   Smokeless tobacco: Never  Vaping Use   Vaping status: Never Used  Substance and Sexual Activity  Alcohol use: Yes    Alcohol/week: 1.0 standard drink of alcohol    Types: 1 Glasses of wine per week    Comment: occasionally a glass of wine cooler   Drug use: Not Currently    Types: Marijuana   Sexual activity: Not on file  Other Topics Concern   Not on file  Social History Narrative   Lives alone   Social Drivers of Health   Financial Resource Strain: Low Risk  (04/25/2017)   Overall Financial Resource Strain (CARDIA)    Difficulty of Paying Living Expenses: Not hard at all  Food Insecurity: No Food Insecurity (03/19/2023)   Hunger Vital Sign    Worried About Running Out of Food in  the Last Year: Never true    Ran Out of Food in the Last Year: Never true  Transportation Needs: No Transportation Needs (03/19/2023)   PRAPARE - Administrator, Civil Service (Medical): No    Lack of Transportation (Non-Medical): No  Physical Activity: Inactive (04/25/2017)   Exercise Vital Sign    Days of Exercise per Week: 0 days    Minutes of Exercise per Session: 0 min  Stress: No Stress Concern Present (04/25/2017)   [employer] of Occupational Health - Occupational Stress Questionnaire    Feeling of Stress : Not at all  Social Connections: Moderately Isolated (03/19/2023)   Social Connection and Isolation Panel [NHANES]    Frequency of Communication with Friends and Family: More than three times a week    Frequency of Social Gatherings with Friends and Family: Once a week    Attends Religious Services: 1 to 4 times per year    Active Member of [employer] or Organizations: No    Attends Engineer, structural: Never    Marital Status: Separated   Family History  Problem Relation Age of Onset   Arthritis Mother    Stroke Mother    Heart disease Mother    Hypertension Mother    Hyperlipidemia Mother    Depression Mother    Seizures Mother    Osteoporosis Mother    Hypertension Father    Seizures Sister    Stroke Sister    Heart disease Maternal Aunt    Hypertension Brother    Arthritis Brother    Depression Brother    Diabetes Brother    Hyperlipidemia Brother    COPD Neg Hx    Cancer Neg Hx    Allergies  Allergen Reactions   Tramadol Nausea Only   Penicillins Rash and Other (See Comments)    Has patient had a PCN reaction causing immediate rash, facial/tongue/throat swelling, SOB or lightheadedness with hypotension: Yes Has patient had a PCN reaction causing severe rash involving mucus membranes or skin necrosis: No Has patient had a PCN reaction that required hospitalization No Has patient had a PCN reaction occurring within the last 10 years:  No If all of the above answers are "NO", then may proceed with Cephalosporin use.    Prior to Admission medications   Medication Sig Start Date End Date Taking? Authorizing Provider  ALPRAZolam Prudy Feeler) 1 MG tablet Take 1 mg by mouth 3 (three) times daily as needed for anxiety. 05/20/20  Yes [provider]  busPIRone (BUSPAR) 30 MG tablet Take 1 tablet by mouth 2 (two) times daily as needed. 07/18/17  Yes [provider]  etanercept (ENBREL) 50 MG/ML injection Inject 1 mL into the skin once a week. 07/10/18  Yes [provider]  FLUoxetine (PROZAC) 20 MG capsule Take 20 mg by mouth daily. 01/15/23  Yes [provider]  rosuvastatin (CRESTOR) 20 MG tablet Take 20 mg by mouth every other day. Pt does not take this med as prescribed Patient not taking: Reported on 03/18/2023    [provider]   CT HAND LEFT W CONTRAST Result Date: 03/18/2023 CLINICAL DATA:  Cat bite on Friday, swelling and erythema EXAM: CT OF THE UPPER LEFT EXTREMITY WITH CONTRAST TECHNIQUE: Multidetector CT imaging of the upper left extremity was performed according to the standard protocol following intravenous contrast administration. RADIATION DOSE REDUCTION: This exam was performed according to the departmental dose-optimization program which includes automated exposure control, adjustment of the mA and/or kV according to patient size and/or use of iterative reconstruction technique. CONTRAST:  80mL OMNIPAQUE IOHEXOL 300 MG/ML  SOLN COMPARISON:  None Available. FINDINGS: Bones/Joint/Cartilage There are no acute or destructive bony abnormalities. Mild diffuse osteoarthritis throughout the wrist and hand, greatest throughout the interphalangeal joints. Ligaments Suboptimally assessed by CT. Muscles and Tendons No gross abnormality by CT. Soft tissues There is subcutaneous fat stranding throughout the dorsum of the hand, with no evidence of fluid collection or abscess. No subcutaneous gas or  radiopaque foreign bodies. Reconstructed images demonstrate no additional findings. IMPRESSION: 1. Subcutaneous fat stranding throughout the dorsum of the hand consistent with cellulitis. No fluid collection or abscess. No subcutaneous gas or radiopaque foreign body. 2. Mild diffuse osteoarthritis. No acute or destructive bony abnormality. Electronically Signed   By: Sharlet Salina M.D.   On: 03/18/2023 15:08    Positive ROS: All other systems have been reviewed and were otherwise negative with the exception of those mentioned in the HPI and as above.  Physical Exam: General:  Alert, no acute distress Psychiatric:  Patient is competent for consent with normal mood and affect   Cardiovascular:  No pedal edema Respiratory:  No wheezing, non-labored breathing GI:  Abdomen is soft and non-tender Skin:  No lesions in the area of chief complaint Neurologic:  Sensation intact distally Lymphatic:  No axillary or cervical lymphadenopathy  Orthopedic Exam:  Orthopedic examination is limited to the left hand.  There is moderate swelling over the radial aspect of the hand both dorsally and volarly.  She exhibits puncture marks in 2 places over the dorsal aspect of the first dorsal interosseous muscle as well as volarly near the proximal palmar crease over the index MCP joint.  There is moderate erythema around the volar bite mark, but no significant erythema is noted over or around either of the dorsal puncture wounds.  She is able to actively flex and extend all digits without pain, although with some tightness, preventing her from making a full fist.  She also has no pain with active or passive motion of the MCP, PIP, or DIP joints of the index finger.  She has neurovascular intact all digits.  X-rays:  A recent CT scan of the left hand with contrast shows evidence of "subcutaneous fat stranding throughout the dorsum of the hand consistent with cellulitis.  No fluid collection or abscess.  No subcutaneous  gas or radiopaque foreign body."  This report and the films were reviewed by myself and discussed with the patient.  Assessment: Left hand cellulitis secondary to cat bite.  Plan: The treatment options have been discussed with the patient.  Based on her physical examination findings today, I do not see evidence of either flexor tenosynovitis or septic joint involvement.  Therefore, I feel that  she may continue to be managed nonoperatively.  I would recommend that she continue to receive IV antibiotics for another 24 hours or so.  She will be reevaluated tomorrow.  If she is continuing to improve and shows no evidence for either flexor tenosynovitis or septic joint, then she should be able to be discharged home on oral antibiotics.  Thank you for asking me to participate in the care of this most pleasant yet unfortunate woman.  I will be happy to follow her with you.   Maryagnes Amos, MD  Beeper #:  [phone]  03/19/2023 7:31 AM

## 2023-03-19 NOTE — Progress Notes (Addendum)
 Progress Note    Janet Zuniga  WUJ:[id number] DOB: 1955/04/20  DOA: 03/18/2023 PCP: Armando Gang, FNP      Brief Narrative:    Medical records reviewed and are as summarized below:  Janet Zuniga is a 68 y.o. female spondyloarthritis, lumbar spinal stenosis, osteoarthritis, osteoporosis, history of left breast cancer s/p chemoradiation in 2010, hyperlipidemia, hypertension, hepatitis C 2007, mood disorder, tobacco use disorder, who presented to the hospital because of pain, redness and swelling of the left hand following a cat bite.  She recently adopted a cat which came from a local shelter.  She attempted to pet the cat but unfortunately, the cat bit her on her left hand between the thumb and index finger.   She was admitted to the hospital for left hand cellulitis following a cat bite.    Assessment/Plan:   Principal Problem:   Cellulitis of left hand Active Problems:   Cat bite   Mood disorder (HCC)   Tobacco abuse    Body mass index is 34.33 kg/m.  (Class I obesity)   Left hand cellulitis following a cat bite: Improving.  Continue empiric IV vancomycin and Zosyn.  Analgesics as needed for pain. S/p Tdap vaccine on 03/18/2023 Appreciate input from Dr. Joette Catching, orthopedic surgeon.   Hypokalemia: Replete potassium and monitor levels.   Mood disorder: Continue Prozac   Tobacco use disorder: Continue nicotine patch.  Advised to quit smoking cigarettes.      Diet Order             Diet heart healthy/carb modified Room service appropriate? Yes; Fluid consistency: Thin  Diet effective now                            Consultants: Orthopedic surgeon  Procedures: None    Medications:    enoxaparin (LOVENOX) injection  40 mg Subcutaneous Q24H   FLUoxetine  20 mg Oral Daily   Continuous Infusions:  piperacillin-tazobactam (ZOSYN)  IV 3.375 g (03/19/23 1237)   vancomycin       Anti-infectives (From admission, onward)     Start     Dose/Rate Route Frequency Ordered Stop   03/19/23 1700  vancomycin (VANCOCIN) IVPB 1000 mg/200 mL premix  Status:  Discontinued       Placed in "Followed by" Linked Group   1,000 mg 200 mL/hr over 60 Minutes Intravenous Every 24 hours 03/18/23 1638 03/19/23 0800   03/19/23 1700  vancomycin (VANCOREADY) IVPB 1250 mg/250 mL       Placed in "Followed by" Linked Group   1,250 mg 166.7 mL/hr over 90 Minutes Intravenous Every 24 hours 03/19/23 0800     03/18/23 2200  piperacillin-tazobactam (ZOSYN) IVPB 3.375 g        3.375 g 12.5 mL/hr over 240 Minutes Intravenous Every 8 hours 03/18/23 1638     03/18/23 1700  vancomycin (VANCOREADY) IVPB 1500 mg/300 mL       Placed in "Followed by" Linked Group   1,500 mg 150 mL/hr over 120 Minutes Intravenous  Once 03/18/23 1638 03/18/23 2101   03/18/23 1400  piperacillin-tazobactam (ZOSYN) IVPB 3.375 g        3.375 g 100 mL/hr over 30 Minutes Intravenous  Once 03/18/23 1356 03/18/23 1704              Family Communication/Anticipated D/C date and plan/Code Status   DVT prophylaxis: enoxaparin (LOVENOX) injection 40 mg Start: 03/18/23 2200  Code Status: Full Code  Family Communication: None Disposition Plan: Plan to discharge home   Status is: Inpatient Remains inpatient appropriate because: Left hand cellulitis       Subjective:   Interval events noted.  She is feeling better today.  Pain and redness in the left hand have improved.  No other complaints.  She is hopeful she can go home tomorrow.  Objective:    Vitals:   03/19/23 0100 03/19/23 0212 03/19/23 0217 03/19/23 0825  BP: 126/65  (!) 123/91 129/69  Pulse: 76  81 72  Resp:   17 12  Temp:   97.8 F (36.6 C) 98.1 F (36.7 C)  TempSrc:   Oral Oral  SpO2: 97%  98% 95%  Weight:  78.4 kg    Height:  4' 11.5" (1.511 m)     No data found.   Intake/Output Summary (Last 24 hours) at 03/19/2023 1303 Last data filed at 03/19/2023 0626 Gross per 24 hour   Intake 2680.51 ml  Output --  Net 2680.51 ml   Filed Weights   03/18/23 1202 03/19/23 0212  Weight: 74.8 kg 78.4 kg    Exam:  GEN: NAD SKIN: Warm and dry EYES: No pallor or icterus ENT: MMM CV: RRR PULM: CTA B ABD: soft, ND, NT, +BS CNS: AAO x 3, non focal EXT: Swelling and mild tenderness of the left hand.  Mild erythematous changes around puncture marks on the palmar aspect near the proximal palmar crease laterally.  Range of motion of all fingers of the left hand is intact without significant tenderness        Data Reviewed:   I have personally reviewed following labs and imaging studies:  Labs: Labs show the following:   Basic Metabolic Panel: Recent Labs  Lab 03/18/23 1205 03/19/23 0620  NA 134* 135  K 3.0* 3.2*  CL 104 102  CO2 21* 25  GLUCOSE 121* 102*  BUN 15 15  CREATININE 0.66 0.76  CALCIUM 8.4* 8.6*   GFR Estimated Creatinine Clearance: 64.2 mL/min (by C-G formula based on SCr of 0.76 mg/dL). Liver Function Tests: Recent Labs  Lab 03/18/23 1205 03/19/23 0620  AST 24 15  ALT 17 13  ALKPHOS 55 47  BILITOT 0.8 0.8  PROT 7.3 6.5  ALBUMIN 3.7 3.3*   No results for input(s): "LIPASE", "AMYLASE" in the last 168 hours. No results for input(s): "AMMONIA" in the last 168 hours. Coagulation profile No results for input(s): "INR", "PROTIME" in the last 168 hours.  CBC: Recent Labs  Lab 03/18/23 1205 03/19/23 0620  WBC 16.8* 12.7*  NEUTROABS 13.6*  --   HGB 13.2 11.4*  HCT 39.5 34.4*  MCV 95.0 96.6  PLT 176 144*   Cardiac Enzymes: No results for input(s): "CKTOTAL", "CKMB", "CKMBINDEX", "TROPONINI" in the last 168 hours. BNP (last 3 results) No results for input(s): "PROBNP" in the last 8760 hours. CBG: No results for input(s): "GLUCAP" in the last 168 hours. D-Dimer: No results for input(s): "DDIMER" in the last 72 hours. Hgb A1c: No results for input(s): "HGBA1C" in the last 72 hours. Lipid Profile: No results for input(s):  "CHOL", "HDL", "LDLCALC", "TRIG", "CHOLHDL", "LDLDIRECT" in the last 72 hours. Thyroid function studies: No results for input(s): "TSH", "T4TOTAL", "T3FREE", "THYROIDAB" in the last 72 hours.  Invalid input(s): "FREET3" Anemia work up: No results for input(s): "VITAMINB12", "FOLATE", "FERRITIN", "TIBC", "IRON", "RETICCTPCT" in the last 72 hours. Sepsis Labs: Recent Labs  Lab 03/18/23 1205 03/18/23 1414 03/19/23 1610  WBC 16.8*  --  12.7*  LATICACIDVEN 2.5* 1.6  --     Microbiology Recent Results (from the past 240 hours)  Blood culture (routine x 2)     Status: None (Preliminary result)   Collection Time: 03/18/23  2:14 PM   Specimen: BLOOD  Result Value Ref Range Status   Specimen Description BLOOD LEFT ANTECUBITAL  Final   Special Requests   Final    BOTTLES DRAWN AEROBIC AND ANAEROBIC Blood Culture results may not be optimal due to an inadequate volume of blood received in culture bottles   Culture   Final    NO GROWTH < 24 HOURS Performed at [facility], [address], [place], [place] [address]    Report Status PENDING  Incomplete  Blood culture (routine x 2)     Status: None (Preliminary result)   Collection Time: 03/18/23  2:14 PM   Specimen: BLOOD  Result Value Ref Range Status   Specimen Description BLOOD BLOOD LEFT HAND  Final   Special Requests   Final    BOTTLES DRAWN AEROBIC AND ANAEROBIC Blood Culture results may not be optimal due to an inadequate volume of blood received in culture bottles   Culture   Final    NO GROWTH < 24 HOURS Performed at [facility], [address], [place], [place] [address]    Report Status PENDING  Incomplete    Procedures and diagnostic studies:  CT HAND LEFT W CONTRAST Result Date: 03/18/2023 CLINICAL DATA:  Cat bite on Friday, swelling and erythema EXAM: CT OF THE UPPER LEFT EXTREMITY WITH CONTRAST TECHNIQUE: Multidetector CT imaging of the upper left extremity was performed according to the standard  protocol following intravenous contrast administration. RADIATION DOSE REDUCTION: This exam was performed according to the departmental dose-optimization program which includes automated exposure control, adjustment of the mA and/or kV according to patient size and/or use of iterative reconstruction technique. CONTRAST:  80mL OMNIPAQUE IOHEXOL 300 MG/ML  SOLN COMPARISON:  None Available. FINDINGS: Bones/Joint/Cartilage There are no acute or destructive bony abnormalities. Mild diffuse osteoarthritis throughout the wrist and hand, greatest throughout the interphalangeal joints. Ligaments Suboptimally assessed by CT. Muscles and Tendons No gross abnormality by CT. Soft tissues There is subcutaneous fat stranding throughout the dorsum of the hand, with no evidence of fluid collection or abscess. No subcutaneous gas or radiopaque foreign bodies. Reconstructed images demonstrate no additional findings. IMPRESSION: 1. Subcutaneous fat stranding throughout the dorsum of the hand consistent with cellulitis. No fluid collection or abscess. No subcutaneous gas or radiopaque foreign body. 2. Mild diffuse osteoarthritis. No acute or destructive bony abnormality. Electronically Signed   By: Sharlet Salina M.D.   On: 03/18/2023 15:08               LOS: 1 day   Talishia Betzler  [facility]   Pager on [email or web address]. If 7PM-7AM, please contact night-coverage at [email or web address]     03/19/2023, 1:03 PM

## 2023-03-19 NOTE — Progress Notes (Signed)
 Pharmacy Antibiotic Note  Janet Zuniga is a 68 y.o. female admitted on 03/18/2023 with cellulitis.  Patient presents to ED after a cat bite to her left hand x 2 days ago. Patient reports swelling and pain but no fever. Pharmacy has been consulted for vancomycin and Zosyn dosing.  Antibiotics already given 3/16: Zosyn 3.375 g IV x 1 @ ~1430  Today, 03/18/23: WBC 16.8, elevated Lactic acid 2.5>1.6 Scr 0.66 (baseline) Afebrile  Micro: Bcx: ngtd  Imaging: CT L Hand: Subcutaneous fat stranding throughout the dorsum of the hand consistent with cellulitis. No fluid collection or abscess. No subcutaneous gas or radiopaque foreign body.  Plan: Adjust Vancomycin to 1250 mg IV every 24 hours Expected AUC: 490.6 Expected Css min: 12.1 SCr used: 0.8  Weight used: IBW, Vd used: 0.72 (BMI 34.25) Conitnue Zosyn 3.375 g IV Q8H Continue to monitor renal function and follow culture results   Height: 4' 11.5" (151.1 cm) Weight: 78.4 kg (172 lb 13.5 oz) IBW/kg (Calculated) : 44.35  Temp (24hrs), Avg:98.2 F (36.8 C), Min:97.8 F (36.6 C), Max:98.6 F (37 C)  Recent Labs  Lab 03/18/23 1205 03/18/23 1414 03/19/23 0620  WBC 16.8*  --  12.7*  CREATININE 0.66  --  0.76  LATICACIDVEN 2.5* 1.6  --     Estimated Creatinine Clearance: 64.2 mL/min (by C-G formula based on SCr of 0.76 mg/dL).    Allergies  Allergen Reactions   Tramadol Nausea Only   Penicillins Rash and Other (See Comments)    Has patient had a PCN reaction causing immediate rash, facial/tongue/throat swelling, SOB or lightheadedness with hypotension: Yes Has patient had a PCN reaction causing severe rash involving mucus membranes or skin necrosis: No Has patient had a PCN reaction that required hospitalization No Has patient had a PCN reaction occurring within the last 10 years: No If all of the above answers are "NO", then may proceed with Cephalosporin use.     Antimicrobials this admission: 3/16 Vanc >>  3/16 Zosyn  >>    Microbiology results: 3/16 BCx: ngtd   Thank you for allowing pharmacy to be a part of this patient's care.  Barrie Folk, PharmD Clinical Pharmacist  03/19/2023 7:58 AM

## 2023-03-20 DIAGNOSIS — L03114 Cellulitis of left upper limb: Secondary | ICD-10-CM | POA: Diagnosis not present

## 2023-03-20 LAB — MAGNESIUM: Magnesium: 2.1 mg/dL (ref 1.7–2.4)

## 2023-03-20 LAB — CBC WITH DIFFERENTIAL/PLATELET
Abs Immature Granulocytes: 0.03 10*3/uL (ref 0.00–0.07)
Basophils Absolute: 0 10*3/uL (ref 0.0–0.1)
Basophils Relative: 0 %
Eosinophils Absolute: 0.3 10*3/uL (ref 0.0–0.5)
Eosinophils Relative: 3 %
HCT: 33.3 % — ABNORMAL LOW (ref 36.0–46.0)
Hemoglobin: 11 g/dL — ABNORMAL LOW (ref 12.0–15.0)
Immature Granulocytes: 0 %
Lymphocytes Relative: 26 %
Lymphs Abs: 2.8 10*3/uL (ref 0.7–4.0)
MCH: 31.5 pg (ref 26.0–34.0)
MCHC: 33 g/dL (ref 30.0–36.0)
MCV: 95.4 fL (ref 80.0–100.0)
Monocytes Absolute: 0.8 10*3/uL (ref 0.1–1.0)
Monocytes Relative: 8 %
Neutro Abs: 6.8 10*3/uL (ref 1.7–7.7)
Neutrophils Relative %: 63 %
Platelets: 153 10*3/uL (ref 150–400)
RBC: 3.49 MIL/uL — ABNORMAL LOW (ref 3.87–5.11)
RDW: 14.1 % (ref 11.5–15.5)
WBC: 10.7 10*3/uL — ABNORMAL HIGH (ref 4.0–10.5)
nRBC: 0 % (ref 0.0–0.2)

## 2023-03-20 LAB — POTASSIUM: Potassium: 4.2 mmol/L (ref 3.5–5.1)

## 2023-03-20 MED ORDER — OXYCODONE HCL 5 MG PO TABS: 5.0000 mg | ORAL_TABLET | Freq: Three times a day (TID) | ORAL | 0 refills | Status: DC | PRN

## 2023-03-20 MED ORDER — METRONIDAZOLE 500 MG PO TABS
500.0000 mg | ORAL_TABLET | Freq: Two times a day (BID) | ORAL | 0 refills | Status: AC
Start: 2023-03-20 — End: 2023-03-27

## 2023-03-20 MED ORDER — ACETAMINOPHEN 325 MG PO TABS: 650.0000 mg | ORAL_TABLET | Freq: Four times a day (QID) | ORAL | Status: DC | PRN

## 2023-03-20 MED ORDER — DOXYCYCLINE HYCLATE 100 MG PO TABS: 100.0000 mg | ORAL_TABLET | Freq: Two times a day (BID) | ORAL | 0 refills | Status: AC

## 2023-03-20 NOTE — Discharge Summary (Addendum)
 Physician Discharge Summary   Patient: Janet Zuniga MRN: [id number] DOB: May 06, 1955  Admit date:     03/18/2023  Discharge date: 03/20/23  Discharge Physician: Lurene Shadow   PCP: Armando Gang, FNP   Recommendations at discharge:   Follow-up with orthopedic surgery team in 5 days Follow-up with PCP in 1 to 2 weeks  Discharge Diagnoses: Principal Problem:   Cellulitis of left hand Active Problems:   Cat bite   Mood disorder (HCC)   Tobacco abuse  Resolved Problems:   * No resolved hospital problems. *  Hospital Course:  Janet Zuniga is a 68 y.o. female spondyloarthritis, lumbar spinal stenosis, osteoarthritis, osteoporosis, history of left breast cancer s/p chemoradiation in 2010, hyperlipidemia, hypertension, hepatitis C 2007, mood disorder, tobacco use disorder, who presented to the hospital because of pain, redness and swelling of the left hand following a cat bite.  She recently adopted a cat which came from a local shelter.  She attempted to pet the cat but unfortunately, the cat bit her on her left hand between the thumb and index finger.     She was admitted to the hospital for left hand cellulitis following a cat bite.    Assessment and Plan:  Left hand cellulitis following a cat bite: Improved.  S/p I&D with exploration of bloody and purulent discharge.  She will be discharged on oxycodone, doxycycline and Flagyl. She was previously treated with IV vancomycin and Zosyn.  Of note, she has a history of remote penicillin allergy (rash) but she tolerated Zosyn without any problems. S/p Tdap vaccine on 03/18/2023     Hypokalemia and hypomagnesemia: Improved after repletion.   Mood disorder: Continue Prozac    Immunocompromised state on Enbrel    Tobacco use disorder: Advised to quit smoking cigarettes.   Her condition has improved and she is deemed stable for discharge to home today.       Consultants: Orthopedic surgeon Procedures performed: I&D  of left hand infection Disposition: Home Diet recommendation:  Discharge Diet Orders (From admission, onward)     Start     Ordered   03/20/23 0000  Diet - low sodium heart healthy        03/20/23 1300           Cardiac diet DISCHARGE MEDICATION: Allergies as of 03/20/2023       Reactions   Tramadol Nausea Only   Penicillins Rash, Other (See Comments)   Has patient had a PCN reaction causing immediate rash, facial/tongue/throat swelling, SOB or lightheadedness with hypotension: Yes Has patient had a PCN reaction causing severe rash involving mucus membranes or skin necrosis: No Has patient had a PCN reaction that required hospitalization No Has patient had a PCN reaction occurring within the last 10 years: No If all of the above answers are "NO", then may proceed with Cephalosporin use.        Medication List     STOP taking these medications    rosuvastatin 20 MG tablet Commonly known as: CRESTOR       TAKE these medications    acetaminophen 325 MG tablet Commonly known as: Tylenol Take 2 tablets (650 mg total) by mouth every 6 (six) hours as needed for mild pain (pain score 1-3).   ALPRAZolam 1 MG tablet Commonly known as: XANAX Take 1 mg by mouth 3 (three) times daily as needed for anxiety.   busPIRone 30 MG tablet Commonly known as: BUSPAR Take 1 tablet by mouth 2 (two) times  daily as needed.   doxycycline 100 MG tablet Commonly known as: VIBRA-TABS Take 1 tablet (100 mg total) by mouth 2 (two) times daily for 8 days.   etanercept 50 MG/ML injection Commonly known as: ENBREL Inject 1 mL into the skin once a week.   FLUoxetine 20 MG capsule Commonly known as: PROZAC Take 20 mg by mouth daily.   metroNIDAZOLE 500 MG tablet Commonly known as: Flagyl Take 1 tablet (500 mg total) by mouth 2 (two) times daily for 7 days.   oxyCODONE 5 MG immediate release tablet Commonly known as: Roxicodone Take 1 tablet (5 mg total) by mouth every 8 (eight)  hours as needed for moderate pain (pain score 4-6).               Discharge Care Instructions  (From admission, onward)           Start     Ordered   03/20/23 0000  Discharge wound care:       Comments: Keep left hand dry and covered   03/20/23 1300            Follow-up Information     Anson Oregon, PA-C Follow up in 5 day(s).   Specialty: Physician Assistant Why: Follow-up early next week for skin check. Contact information: [address] [place] [address] [phone]                Discharge Exam: Filed Weights   03/18/23 1202 03/19/23 0212  Weight: 74.8 kg 78.4 kg   GEN: NAD SKIN: Warm and dry EYES: No pallor or icterus ENT: MMM CV: RRR PULM: CTA B ABD: soft, obese, NT, +BS CNS: AAO x 3, non focal EXT: Left hand swelling and erythema on the dorsal aspect have improved.  There is still some erythema in the volar area around puncture wounds around the proximal second and third MCP joints.   Condition at discharge: good  The results of significant diagnostics from this hospitalization (including imaging, microbiology, ancillary and laboratory) are listed below for reference.   Imaging Studies: CT HAND LEFT W CONTRAST Result Date: 03/18/2023 CLINICAL DATA:  Cat bite on Friday, swelling and erythema EXAM: CT OF THE UPPER LEFT EXTREMITY WITH CONTRAST TECHNIQUE: Multidetector CT imaging of the upper left extremity was performed according to the standard protocol following intravenous contrast administration. RADIATION DOSE REDUCTION: This exam was performed according to the departmental dose-optimization program which includes automated exposure control, adjustment of the mA and/or kV according to patient size and/or use of iterative reconstruction technique. CONTRAST:  80mL OMNIPAQUE IOHEXOL 300 MG/ML  SOLN COMPARISON:  None Available. FINDINGS: Bones/Joint/Cartilage There are no acute or destructive bony abnormalities. Mild diffuse  osteoarthritis throughout the wrist and hand, greatest throughout the interphalangeal joints. Ligaments Suboptimally assessed by CT. Muscles and Tendons No gross abnormality by CT. Soft tissues There is subcutaneous fat stranding throughout the dorsum of the hand, with no evidence of fluid collection or abscess. No subcutaneous gas or radiopaque foreign bodies. Reconstructed images demonstrate no additional findings. IMPRESSION: 1. Subcutaneous fat stranding throughout the dorsum of the hand consistent with cellulitis. No fluid collection or abscess. No subcutaneous gas or radiopaque foreign body. 2. Mild diffuse osteoarthritis. No acute or destructive bony abnormality. Electronically Signed   By: Sharlet Salina M.D.   On: 03/18/2023 15:08    Microbiology: Results for orders placed or performed during the hospital encounter of 03/18/23  Blood culture (routine x 2)     Status: None (  Preliminary result)   Collection Time: 03/18/23  2:14 PM   Specimen: BLOOD  Result Value Ref Range Status   Specimen Description BLOOD LEFT ANTECUBITAL  Final   Special Requests   Final    BOTTLES DRAWN AEROBIC AND ANAEROBIC Blood Culture results may not be optimal due to an inadequate volume of blood received in culture bottles   Culture   Final    NO GROWTH 2 DAYS Performed at [facility], [address] [place], [place] [address]    Report Status PENDING  Incomplete  Blood culture (routine x 2)     Status: None (Preliminary result)   Collection Time: 03/18/23  2:14 PM   Specimen: BLOOD  Result Value Ref Range Status   Specimen Description BLOOD BLOOD LEFT HAND  Final   Special Requests   Final    BOTTLES DRAWN AEROBIC AND ANAEROBIC Blood Culture results may not be optimal due to an inadequate volume of blood received in culture bottles   Culture   Final    NO GROWTH 2 DAYS Performed at [facility], [address] [place], [place] [address]    Report Status PENDING  Incomplete     Labs: CBC: Recent Labs  Lab 03/18/23 1205 03/19/23 0620 03/20/23 0325  WBC 16.8* 12.7* 10.7*  NEUTROABS 13.6*  --  6.8  HGB 13.2 11.4* 11.0*  HCT 39.5 34.4* 33.3*  MCV 95.0 96.6 95.4  PLT 176 144* 153   Basic Metabolic Panel: Recent Labs  Lab 03/18/23 1205 03/19/23 0620 03/20/23 0325  NA 134* 135  --   K 3.0* 3.2* 4.2  CL 104 102  --   CO2 21* 25  --   GLUCOSE 121* 102*  --   BUN 15 15  --   CREATININE 0.66 0.76  --   CALCIUM 8.4* 8.6*  --   MG  --  1.6* 2.1   Liver Function Tests: Recent Labs  Lab 03/18/23 1205 03/19/23 0620  AST 24 15  ALT 17 13  ALKPHOS 55 47  BILITOT 0.8 0.8  PROT 7.3 6.5  ALBUMIN 3.7 3.3*   CBG: No results for input(s): "GLUCAP" in the last 168 hours.  Discharge time spent: greater than 30 minutes.  Signed: Lurene Shadow, MD [facility] 03/20/2023

## 2023-03-20 NOTE — Plan of Care (Signed)

## 2023-03-20 NOTE — Plan of Care (Signed)
IV removed, discharge instructions reviewed and patient discharged to home

## 2023-03-20 NOTE — Progress Notes (Signed)
 Left hand Incision and Drainage: Skin examination of the left hand did continue to reveal moderate swelling along the radial aspect of the hand, espeically along the volar aspect of the hand along the proximal aspect of the 2 and 3rd MCP joints.  Along the volar aspect of the hand 2 small puncture wounds can be identified with a moderate amount of erythema and some mild fluctuance with palpation of the area.  Area of erythema is approx. 4 cm in diameter.  After a discussion of the risk and benefits the patient elected to proceed with a bedside incision and drainage.  The volar aspect of the hand was cleaned using a alcohol swab before 5 cc of 0.5% Marcaine without epi was injected around the area.  After allowing adequate time for anesthetic effect to take place the skin was cleaned with Betadine before a small stab incision was made in a transverse fashion with the radial border of the incision involving the puncture wound.  After a small incision was made, a snap was used to open any potential pus loculations.  After initially only encountering a bloody discharge from the hand after further probing a mild amount of purulent material could be expressed.  The incision was irrigated with copious saline.  After several washouts no purulent material could be expressed.  The patient was able to flex and extend her fingers without significant pain.  Xeroform and a dry dressing was applied to the right hand before an Ace wrap was applied.  The patient tolerated the procedure well without any complications.  The patient still has no signs of evidence of flexor tenosynovitis or septic arthritis.  The patient is cleared for discharge home from a orthopedic standpoint.  Would recommend follow-up early next week for repeat skin check in the office.  Continue to keep the hand dry and discharged home with oral antibiotics.  Valeria Batman, PA-C, CAQ-OS [facility]

## 2023-03-20 NOTE — Discharge Instructions (Signed)
-  Keep hand covered and dry. -Keep hand elevated above heart level. -Follow-up next week for skin check.

## 2023-03-20 NOTE — TOC CM/SW Note (Signed)
 Transition of Care [facility]) - Inpatient Brief Assessment   Patient Details  Name: KHADEJA ABT MRN: [id number] Date of Birth: 1955-10-29  Transition of Care [facility]) CM/SW Contact:    Chapman Fitch, RN Phone Number: 03/20/2023, 1:40 PM   Clinical Narrative:    Transition of Care [facility]) Screening Note   Patient Details  Name: VALLERI HENDRICKSEN Date of Birth: Mar 13, 1955   Transition of Care [facility]) CM/SW Contact:    Chapman Fitch, RN Phone Number: 03/20/2023, 1:40 PM    Transition of Care Department [facility]) has reviewed patient and no TOC needs have been identified at this time.  If new patient transition needs arise, please place a TOC consult.   Transition of Care Asessment: Insurance and Status: Insurance coverage has been reviewed Patient has primary care physician: Yes     Prior/Current Home Services: No current home services Social Drivers of Health Review: SDOH reviewed no interventions necessary Readmission risk has been reviewed: Yes Transition of care needs: no transition of care needs at this time

## 2023-03-23 LAB — CULTURE, BLOOD (ROUTINE X 2)
Culture: NO GROWTH
Culture: NO GROWTH

## 2023-03-28 ENCOUNTER — Encounter (INDEPENDENT_AMBULATORY_CARE_PROVIDER_SITE_OTHER): Payer: Medicare Other

## 2023-03-28 ENCOUNTER — Encounter (INDEPENDENT_AMBULATORY_CARE_PROVIDER_SITE_OTHER): Payer: 59 | Admitting: Nurse Practitioner

## 2023-05-07 ENCOUNTER — Encounter (INDEPENDENT_AMBULATORY_CARE_PROVIDER_SITE_OTHER): Admitting: Nurse Practitioner

## 2023-05-07 ENCOUNTER — Encounter (INDEPENDENT_AMBULATORY_CARE_PROVIDER_SITE_OTHER)

## 2023-05-22 ENCOUNTER — Encounter (INDEPENDENT_AMBULATORY_CARE_PROVIDER_SITE_OTHER): Payer: Self-pay

## 2023-05-22 ENCOUNTER — Telehealth (INDEPENDENT_AMBULATORY_CARE_PROVIDER_SITE_OTHER): Payer: Self-pay

## 2023-05-22 NOTE — Telephone Encounter (Signed)
 Pt called stating she has an appt on June 5th but her legs are aching so bad she is in tears and she needs to see someone before that. She has been rubbing Icy Hot on them.  She states that she saw Dr Rozetta Corns a week ago and had a test done that says the blockage is getting worse from her knee up.  Please advise and if she does not answer when you return the call please leave a voicemail per pt.

## 2023-05-23 NOTE — Telephone Encounter (Signed)
 Patient can be placed on cancellation list.  Unfortunately we cannot offer medical advice as we have not seen and evaluated the patient yet.  I would advise she reach out to her PCP for further evaluation until she can be seen.

## 2023-05-23 NOTE — Telephone Encounter (Signed)
 Left detailed VM for pt and let her know to call back with any further questions

## 2023-06-07 ENCOUNTER — Ambulatory Visit (INDEPENDENT_AMBULATORY_CARE_PROVIDER_SITE_OTHER)

## 2023-06-07 ENCOUNTER — Encounter (INDEPENDENT_AMBULATORY_CARE_PROVIDER_SITE_OTHER): Payer: Self-pay | Admitting: Nurse Practitioner

## 2023-06-07 ENCOUNTER — Ambulatory Visit (INDEPENDENT_AMBULATORY_CARE_PROVIDER_SITE_OTHER): Admitting: Nurse Practitioner

## 2023-06-07 VITALS — BP 184/103 | HR 75 | Resp 16 | Ht 59.5 in | Wt 160.8 lb

## 2023-06-07 DIAGNOSIS — Z720 Tobacco use: Secondary | ICD-10-CM

## 2023-06-07 DIAGNOSIS — M79605 Pain in left leg: Secondary | ICD-10-CM

## 2023-06-07 DIAGNOSIS — I10 Essential (primary) hypertension: Secondary | ICD-10-CM | POA: Diagnosis not present

## 2023-06-07 NOTE — Progress Notes (Signed)
 Subjective:    Patient ID: Janet Zuniga, female    DOB: May 27, 1955, 68 y.o.   MRN: [id number] Chief Complaint  Patient presents with   New Patient (Initial Visit)    Ref Rozetta Corns consult left le pain    The patient is a 68 year old female who is referred by Rueben Cote in regards to left lower extremity pain.  The referral was sent approximately 6 months ago.  She notes that currently she is not having any pain in her left lower extremity she denies any claudication-like symptoms.  She denies any rest pain.  There are currently no open wounds or ulcerations.  She denies any edema.  She is a current smoker.  Today noninvasive studies show a right ABI 0.96 and a left of 1.01 with strong triphasic waveforms on the left and multiphasic on the right.  Good toe waveforms bilaterally.    Review of Systems  Cardiovascular:  Negative for leg swelling.  Skin:  Negative for wound.  All other systems reviewed and are negative.      Objective:    Physical Exam Vitals reviewed.  HENT:     Head: Normocephalic.  Cardiovascular:     Rate and Rhythm: Normal rate.     Pulses:          Dorsalis pedis pulses are 1+ on the right side and 1+ on the left side.  Pulmonary:     Effort: Pulmonary effort is normal.  Skin:    General: Skin is warm and dry.  Neurological:     Mental Status: She is alert and oriented to person, place, and time.  Psychiatric:        Mood and Affect: Mood normal.        Behavior: Behavior normal.        Thought Content: Thought content normal.        Judgment: Judgment normal.     BP (!) 184/103   Pulse 75   Resp 16   Ht 4' 11.5" (1.511 m)   Wt 160 lb 12.8 oz (72.9 kg)   BMI 31.93 kg/m   Past Medical History:  Diagnosis Date   Ankylosing spondylitis (HCC)    Anxiety    Arthritis    Blood in stool    Breast cancer (HCC) 2010   Left breast- chemo/radiation   Chronic low back pain    Chronic pain    Depression    Headache    Hepatitis C 2007   Hip  fracture (HCC)    History of blood transfusion    Hypertension    Insomnia    OA (osteoarthritis)    Osteoporosis    Personal history of chemotherapy    Personal history of radiation therapy    Spinal stenosis of lumbar region    Spondyloarthritis     Social History   Socioeconomic History   Marital status: Legally Separated    Spouse name: Not on file   Number of children: Not on file   Years of education: Not on file   Highest education level: Not on file  Occupational History   Not on file  Tobacco Use   Smoking status: Every Day    Current packs/day: 0.20    Average packs/day: 0.2 packs/day for 25.0 years (5.0 ttl pk-yrs)    Types: Cigarettes   Smokeless tobacco: Never  Vaping Use   Vaping status: Never Used  Substance and Sexual Activity   Alcohol use: Yes    Alcohol/week:  1.0 standard drink of alcohol    Types: 1 Glasses of wine per week    Comment: occasionally a glass of wine cooler   Drug use: Not Currently    Types: Marijuana   Sexual activity: Not on file  Other Topics Concern   Not on file  Social History Narrative   Lives alone   Social Drivers of Health   Financial Resource Strain: Low Risk  (03/27/2023)   Received from [facility]   Overall Financial Resource Strain (CARDIA)    Difficulty of Paying Living Expenses: Not hard at all  Food Insecurity: No Food Insecurity (03/27/2023)   Received from [facility]   Hunger Vital Sign    Worried About Running Out of Food in the Last Year: Never true    Ran Out of Food in the Last Year: Never true  Transportation Needs: Unmet Transportation Needs (03/27/2023)   Received from [facility] - Transportation    In the past 12 months, has lack of transportation kept you from medical appointments or from getting medications?: Yes    Lack of Transportation (Non-Medical): No  Physical Activity: Inactive (04/25/2017)   Exercise Vital Sign    Days of  Exercise per Week: 0 days    Minutes of Exercise per Session: 0 min  Stress: No Stress Concern Present (04/25/2017)   [employer] of Occupational Health - Occupational Stress Questionnaire    Feeling of Stress : Not at all  Social Connections: Moderately Isolated (03/19/2023)   Social Connection and Isolation Panel [NHANES]    Frequency of Communication with Friends and Family: More than three times a week    Frequency of Social Gatherings with Friends and Family: Once a week    Attends Religious Services: 1 to 4 times per year    Active Member of [employer] or Organizations: No    Attends Banker Meetings: Never    Marital Status: Separated  Intimate Partner Violence: Not At Risk (03/19/2023)   Humiliation, Afraid, Rape, and Kick questionnaire    Fear of Current or Ex-Partner: No    Emotionally Abused: No    Physically Abused: No    Sexually Abused: No    Past Surgical History:  Procedure Laterality Date   BREAST BIOPSY Right 1983   neg   BREAST EXCISIONAL BIOPSY Left 2010   triple neg   BREAST LUMPECTOMY Left 2010   FEMUR FRACTURE SURGERY     FOOT SURGERY  2000 and 2003   FRACTURE SURGERY     INTRAMEDULLARY (IM) NAIL INTERTROCHANTERIC Right 01/12/2015   Procedure: INTRAMEDULLARY (IM) NAIL INTERTROCHANTRIC;  Surgeon: Marlynn Singer, MD;  Location: [facility] ORS;  Service: Orthopedics;  Laterality: Right;   KNEE SURGERY Left 2004   SHOULDER ARTHROSCOPY WITH SUBACROMIAL DECOMPRESSION, ROTATOR CUFF REPAIR AND BICEP TENDON REPAIR Right 07/12/2021   Procedure: RIGHT SHOULDER ARTHROSCOPY WITH DEBRIDEMENT, DECOMPRESSION, ROTATOR CUFF REPAIR, AND  BICEPS TENODESIS.;  Surgeon: Elner Hahn, MD;  Location: [facility] ORS;  Service: Orthopedics;  Laterality: Right;   TUBAL LIGATION      Family History  Problem Relation Age of Onset   Arthritis Mother    Stroke Mother    Heart disease Mother    Hypertension Mother    Hyperlipidemia Mother    Depression Mother    Seizures Mother     Osteoporosis Mother    Hypertension Father    Seizures Sister    Stroke Sister  Heart disease Maternal Aunt    Hypertension Brother    Arthritis Brother    Depression Brother    Diabetes Brother    Hyperlipidemia Brother    COPD Neg Hx    Cancer Neg Hx     Allergies  Allergen Reactions   Tramadol  Nausea Only   Penicillins Rash and Other (See Comments)    Has patient had a PCN reaction causing immediate rash, facial/tongue/throat swelling, SOB or lightheadedness with hypotension: Yes Has patient had a PCN reaction causing severe rash involving mucus membranes or skin necrosis: No Has patient had a PCN reaction that required hospitalization No Has patient had a PCN reaction occurring within the last 10 years: No If all of the above answers are "NO", then may proceed with Cephalosporin use.        Latest Ref Rng & Units 03/20/2023    3:25 AM 03/19/2023    6:20 AM 03/18/2023   12:05 PM  CBC  WBC 4.0 - 10.5 K/uL 10.7  12.7  16.8   Hemoglobin 12.0 - 15.0 g/dL 78.2  95.6  21.3   Hematocrit 36.0 - 46.0 % 33.3  34.4  39.5   Platelets 150 - 400 K/uL 153  144  176        CMP     Component Value Date/Time   NA 135 03/19/2023 0620   NA 140 04/25/2017 1456   NA 140 04/21/2014 2141   K 4.2 03/20/2023 0325   K 3.4 (L) 04/21/2014 2141   CL 102 03/19/2023 0620   CL 106 04/21/2014 2141   CO2 25 03/19/2023 0620   CO2 25 04/21/2014 2141   GLUCOSE 102 (H) 03/19/2023 0620   GLUCOSE 119 (H) 04/21/2014 2141   BUN 15 03/19/2023 0620   BUN 14 04/25/2017 1456   BUN 8 04/21/2014 2141   CREATININE 0.76 03/19/2023 0620   CREATININE 1.29 (H) 04/21/2014 2141   CALCIUM 8.6 (L) 03/19/2023 0620   CALCIUM 9.0 04/21/2014 2141   PROT 6.5 03/19/2023 0620   PROT 7.5 04/25/2017 1456   PROT 8.6 (H) 04/21/2014 2141   ALBUMIN 3.3 (L) 03/19/2023 0620   ALBUMIN 4.2 04/25/2017 1456   ALBUMIN 4.3 04/21/2014 2141   AST 15 03/19/2023 0620   AST 77 (H) 04/21/2014 2141   ALT 13 03/19/2023 0620   ALT  78 (H) 04/21/2014 2141   ALKPHOS 47 03/19/2023 0620   ALKPHOS 127 (H) 04/21/2014 2141   BILITOT 0.8 03/19/2023 0620   BILITOT <0.2 04/25/2017 1456   BILITOT 0.8 04/21/2014 2141   GFRNONAA >60 03/19/2023 0620   GFRNONAA 46 (L) 04/21/2014 2141     No results found.     Assessment & Plan:   1. Left leg pain (Primary) Based on the patient's noninvasive studies I do not see any significant arterial disease.  The patient also notes that she is not currently having any pain in the left lower extremity as she was previously referred for.  If the patient continues to have any ongoing issues with her lower extremity she should follow-up with her PCP.  Patient will follow-up with us  on an as-needed basis.  2. Primary hypertension Continue antihypertensive medications as already ordered, these medications have been reviewed and there are no changes at this time.  3. Tobacco abuse Smoking cessation was discussed, 3-10 minutes spent on this topic specifically   Current Outpatient Medications on File Prior to Visit  Medication Sig Dispense Refill   ALPRAZolam  (XANAX ) 1 MG tablet Take  1 mg by mouth 3 (three) times daily as needed for anxiety.     busPIRone  (BUSPAR ) 30 MG tablet Take 1 tablet by mouth 2 (two) times daily as needed.     etanercept (ENBREL) 50 MG/ML injection Inject 1 mL into the skin once a week.     FLUoxetine  (PROZAC ) 20 MG capsule Take 20 mg by mouth daily.     acetaminophen  (TYLENOL ) 325 MG tablet Take 2 tablets (650 mg total) by mouth every 6 (six) hours as needed for mild pain (pain score 1-3). (Patient not taking: Reported on 06/07/2023)     oxyCODONE  (ROXICODONE ) 5 MG immediate release tablet Take 1 tablet (5 mg total) by mouth every 8 (eight) hours as needed for moderate pain (pain score 4-6). (Patient not taking: Reported on 06/07/2023) 10 tablet 0   No current facility-administered medications on file prior to visit.    There are no Patient Instructions on file for this  visit. No follow-ups on file.   Kanijah Groseclose E [place] Duerst, NP

## 2023-06-08 LAB — VAS US ABI WITH/WO TBI
Left ABI: 1.01
Right ABI: 0.96

## 2023-06-14 ENCOUNTER — Emergency Department
Admission: EM | Admit: 2023-06-14 | Discharge: 2023-06-14 | Discharge status: Against Medical Advice | Attending: Emergency Medicine | Admitting: Emergency Medicine

## 2023-06-14 ENCOUNTER — Other Ambulatory Visit: Payer: Self-pay

## 2023-06-14 DIAGNOSIS — Z5321 Procedure and treatment not carried out due to patient leaving prior to being seen by health care provider: Secondary | ICD-10-CM | POA: Diagnosis not present

## 2023-06-14 DIAGNOSIS — K649 Unspecified hemorrhoids: Secondary | ICD-10-CM | POA: Insufficient documentation

## 2023-06-14 DIAGNOSIS — K625 Hemorrhage of anus and rectum: Secondary | ICD-10-CM | POA: Diagnosis present

## 2023-06-14 LAB — COMPREHENSIVE METABOLIC PANEL WITH GFR
ALT: 30 U/L (ref 0–44)
AST: 29 U/L (ref 15–41)
Albumin: 4 g/dL (ref 3.5–5.0)
Alkaline Phosphatase: 57 U/L (ref 38–126)
Anion gap: 13 (ref 5–15)
BUN: 19 mg/dL (ref 8–23)
CO2: 23 mmol/L (ref 22–32)
Calcium: 9.1 mg/dL (ref 8.9–10.3)
Chloride: 108 mmol/L (ref 98–111)
Creatinine, Ser: 0.6 mg/dL (ref 0.44–1.00)
GFR, Estimated: >60 mL/min (ref >60)
Glucose, Bld: 91 mg/dL (ref 70–99)
Potassium: 3.9 mmol/L (ref 3.5–5.1)
Sodium: 144 mmol/L (ref 135–145)
Total Bilirubin: 0.5 mg/dL (ref 0.0–1.2)
Total Protein: 7.5 g/dL (ref 6.5–8.1)

## 2023-06-14 LAB — TYPE AND SCREEN
ABO/RH(D): O NEG
Antibody Screen: NEGATIVE

## 2023-06-14 LAB — CBC
HCT: 40.7 % (ref 36.0–46.0)
Hemoglobin: 13.6 g/dL (ref 12.0–15.0)
MCH: 31.5 pg (ref 26.0–34.0)
MCHC: 33.4 g/dL (ref 30.0–36.0)
MCV: 94.2 fL (ref 80.0–100.0)
Platelets: 193 10*3/uL (ref 150–400)
RBC: 4.32 MIL/uL (ref 3.87–5.11)
RDW: 14.7 % (ref 11.5–15.5)
WBC: 5.5 10*3/uL (ref 4.0–10.5)
nRBC: 0 % (ref 0.0–0.2)

## 2023-06-14 NOTE — ED Notes (Signed)
 Patient called on cell phone after unable to find in lobby. Reported to this RN over phone she no longer wants to be seen and waiting on her brother to come pick her up.

## 2023-06-14 NOTE — ED Triage Notes (Signed)
 Pt to ed from home via [facility] for rectal bleeding. Pt has HX of hemorrhoids. Pt is caox4, in no acute distress in triage. Pt has been having some bright red bleeding from her rectum since Tuesday.

## 2023-06-18 ENCOUNTER — Other Ambulatory Visit: Payer: Self-pay | Admitting: Family Medicine

## 2023-06-18 DIAGNOSIS — Z1231 Encounter for screening mammogram for malignant neoplasm of breast: Secondary | ICD-10-CM

## 2023-08-07 ENCOUNTER — Encounter

## 2023-09-06 ENCOUNTER — Ambulatory Visit
Admission: RE | Admit: 2023-09-06 | Discharge: 2023-09-06 | Disposition: A | Discharge status: Home / Self Care | Source: Ambulatory Visit | Attending: Family Medicine | Admitting: Family Medicine

## 2023-09-06 DIAGNOSIS — Z1231 Encounter for screening mammogram for malignant neoplasm of breast: Secondary | ICD-10-CM | POA: Insufficient documentation

## 2023-09-07 ENCOUNTER — Other Ambulatory Visit: Payer: Self-pay | Admitting: Family Medicine

## 2023-09-07 DIAGNOSIS — N644 Mastodynia: Secondary | ICD-10-CM

## 2023-09-12 ENCOUNTER — Inpatient Hospital Stay
Admission: RE | Admit: 2023-09-12 | Discharge: 2023-09-12 | Disposition: A | Discharge status: Home / Self Care | Source: Ambulatory Visit | Attending: Family Medicine

## 2023-09-12 ENCOUNTER — Ambulatory Visit
Admission: RE | Admit: 2023-09-12 | Discharge: 2023-09-12 | Disposition: A | Discharge status: Home / Self Care | Source: Ambulatory Visit | Attending: Family Medicine | Admitting: Family Medicine

## 2023-09-12 DIAGNOSIS — N644 Mastodynia: Secondary | ICD-10-CM | POA: Diagnosis present

## 2023-09-17 ENCOUNTER — Other Ambulatory Visit: Payer: Self-pay | Admitting: Family Medicine

## 2023-09-17 DIAGNOSIS — R928 Other abnormal and inconclusive findings on diagnostic imaging of breast: Secondary | ICD-10-CM

## 2023-09-19 ENCOUNTER — Ambulatory Visit
Admission: RE | Admit: 2023-09-19 | Discharge: 2023-09-19 | Disposition: A | Discharge status: Home / Self Care | Source: Ambulatory Visit | Attending: Family Medicine | Admitting: Family Medicine

## 2023-09-19 DIAGNOSIS — C50811 Malignant neoplasm of overlapping sites of right female breast: Secondary | ICD-10-CM | POA: Diagnosis present

## 2023-09-19 DIAGNOSIS — R928 Other abnormal and inconclusive findings on diagnostic imaging of breast: Secondary | ICD-10-CM | POA: Diagnosis present

## 2023-09-19 HISTORY — PX: BREAST BIOPSY: SHX20

## 2023-09-19 MED ORDER — LIDOCAINE-EPINEPHRINE 1 %-1:100000 IJ SOLN
5.0000 mL | Freq: Once | INTRAMUSCULAR | Status: AC
Start: 2023-09-19 — End: 2023-09-19
  Administered 2023-09-19: 5 mL
  Filled 2023-09-19: qty 5

## 2023-09-19 MED ORDER — LIDOCAINE 1 % OPTIME INJ - NO CHARGE
1.0000 mL | Freq: Once | INTRAMUSCULAR | Status: AC
  Administered 2023-09-19: 1 mL
  Filled 2023-09-19: qty 2

## 2023-09-20 ENCOUNTER — Other Ambulatory Visit: Payer: Self-pay

## 2023-09-20 DIAGNOSIS — C50911 Malignant neoplasm of unspecified site of right female breast: Secondary | ICD-10-CM

## 2023-09-20 LAB — SURGICAL PATHOLOGY

## 2023-09-20 NOTE — Progress Notes (Signed)
 Referral received for new diagnosis of breast cancer. Voicemail left to return call.

## 2023-09-20 NOTE — Progress Notes (Signed)
 Spoke with Ms. Janet Zuniga. She reports she was treated for LEFT breast cancer by Dr. Jacobo and Dr. Dessa approximately 15 years ago. Last seen 10/18/2018 and was released from clinic. She has a new diagnosis of RIGHT breast cancer. Appointment arranged with Dr. Jacobo for 09/26/23 at 1400. Referral sent to [facility] for surgical consult.

## 2023-09-26 ENCOUNTER — Encounter: Payer: Self-pay | Admitting: *Deleted

## 2023-09-26 ENCOUNTER — Inpatient Hospital Stay: Attending: Oncology | Admitting: Oncology

## 2023-09-26 ENCOUNTER — Inpatient Hospital Stay

## 2023-09-26 VITALS — BP 130/80 | HR 86 | Temp 98.3°F | Resp 16 | Wt 167.0 lb

## 2023-09-26 DIAGNOSIS — Z8262 Family history of osteoporosis: Secondary | ICD-10-CM | POA: Insufficient documentation

## 2023-09-26 DIAGNOSIS — M549 Dorsalgia, unspecified: Secondary | ICD-10-CM | POA: Diagnosis not present

## 2023-09-26 DIAGNOSIS — M459 Ankylosing spondylitis of unspecified sites in spine: Secondary | ICD-10-CM | POA: Diagnosis not present

## 2023-09-26 DIAGNOSIS — Z823 Family history of stroke: Secondary | ICD-10-CM | POA: Diagnosis not present

## 2023-09-26 DIAGNOSIS — Z885 Allergy status to narcotic agent status: Secondary | ICD-10-CM | POA: Insufficient documentation

## 2023-09-26 DIAGNOSIS — G8929 Other chronic pain: Secondary | ICD-10-CM | POA: Insufficient documentation

## 2023-09-26 DIAGNOSIS — Z79899 Other long term (current) drug therapy: Secondary | ICD-10-CM | POA: Insufficient documentation

## 2023-09-26 DIAGNOSIS — Z880 Allergy status to penicillin: Secondary | ICD-10-CM | POA: Diagnosis not present

## 2023-09-26 DIAGNOSIS — Z923 Personal history of irradiation: Secondary | ICD-10-CM | POA: Diagnosis not present

## 2023-09-26 DIAGNOSIS — C50411 Malignant neoplasm of upper-outer quadrant of right female breast: Secondary | ICD-10-CM | POA: Insufficient documentation

## 2023-09-26 DIAGNOSIS — Z1732 Human epidermal growth factor receptor 2 negative status: Secondary | ICD-10-CM | POA: Insufficient documentation

## 2023-09-26 DIAGNOSIS — I10 Essential (primary) hypertension: Secondary | ICD-10-CM | POA: Insufficient documentation

## 2023-09-26 DIAGNOSIS — Z8619 Personal history of other infectious and parasitic diseases: Secondary | ICD-10-CM | POA: Insufficient documentation

## 2023-09-26 DIAGNOSIS — N6315 Unspecified lump in the right breast, overlapping quadrants: Secondary | ICD-10-CM | POA: Diagnosis not present

## 2023-09-26 DIAGNOSIS — Z8261 Family history of arthritis: Secondary | ICD-10-CM | POA: Diagnosis not present

## 2023-09-26 DIAGNOSIS — Z83438 Family history of other disorder of lipoprotein metabolism and other lipidemia: Secondary | ICD-10-CM | POA: Diagnosis not present

## 2023-09-26 DIAGNOSIS — Z171 Estrogen receptor negative status [ER-]: Secondary | ICD-10-CM | POA: Insufficient documentation

## 2023-09-26 DIAGNOSIS — N644 Mastodynia: Secondary | ICD-10-CM | POA: Diagnosis not present

## 2023-09-26 DIAGNOSIS — Z833 Family history of diabetes mellitus: Secondary | ICD-10-CM | POA: Insufficient documentation

## 2023-09-26 DIAGNOSIS — Z818 Family history of other mental and behavioral disorders: Secondary | ICD-10-CM | POA: Insufficient documentation

## 2023-09-26 DIAGNOSIS — Z8249 Family history of ischemic heart disease and other diseases of the circulatory system: Secondary | ICD-10-CM | POA: Insufficient documentation

## 2023-09-26 DIAGNOSIS — F419 Anxiety disorder, unspecified: Secondary | ICD-10-CM | POA: Diagnosis not present

## 2023-09-26 DIAGNOSIS — Z1722 Progesterone receptor negative status: Secondary | ICD-10-CM | POA: Insufficient documentation

## 2023-09-26 DIAGNOSIS — Z9221 Personal history of antineoplastic chemotherapy: Secondary | ICD-10-CM | POA: Insufficient documentation

## 2023-09-26 DIAGNOSIS — F17210 Nicotine dependence, cigarettes, uncomplicated: Secondary | ICD-10-CM | POA: Diagnosis not present

## 2023-09-26 DIAGNOSIS — C50911 Malignant neoplasm of unspecified site of right female breast: Secondary | ICD-10-CM

## 2023-09-26 DIAGNOSIS — T50905A Adverse effect of unspecified drugs, medicaments and biological substances, initial encounter: Secondary | ICD-10-CM | POA: Insufficient documentation

## 2023-09-26 MED ORDER — ALPRAZOLAM 0.25 MG PO TABS: 0.2500 mg | ORAL_TABLET | Freq: Every evening | ORAL | 0 refills | Status: DC | PRN

## 2023-09-26 NOTE — Progress Notes (Unsigned)
 Accompanied patient and family to initial medical oncology appointment.   Reviewed Breast Cancer treatment handbook.   Care plan summary given to patient.   Reviewed outreach programs and cancer center services.

## 2023-09-26 NOTE — Progress Notes (Unsigned)
 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  INTERVAL HISTORY: Patient is a 68 year old female who has a distant history of triple negative left breast cancer who was noted to have an abnormality on routine screening mammogram.  Subsequent ultrasound biopsy revealed the above-stated malignancy.  This is clearly a second primary.  She is anxious, but otherwise feels well.  She has chronic back pain from ankylosing spondylitis.  She has no neurologic complaints.  She denies any recent fevers or illnesses.  She has good appetite and denies weight loss.  She has no chest pain, shortness of breath, cough, or hemoptysis.  She denies any nausea, vomiting, constipation, or diarrhea.  She has no urinary complaints.  Patient offers no further specific complaints today.  REVIEW OF SYSTEMS:   Review of Systems  Constitutional: Negative.  Negative for fever, malaise/fatigue and weight loss.  Respiratory: Negative.  Negative for cough, hemoptysis and shortness of breath.   Cardiovascular: Negative.  Negative for chest pain and leg swelling.  Gastrointestinal: Negative.  Negative for abdominal pain.  Genitourinary: Negative.  Negative for dysuria.  Musculoskeletal:  Positive for back pain.  Skin: Negative.  Negative for rash.  Neurological: Negative.  Negative for dizziness, focal weakness, weakness and headaches.  Psychiatric/Behavioral:  The patient is nervous/anxious.     As per HPI. Otherwise, a complete review of systems is negative.  PAST MEDICAL HISTORY: Past Medical History:  Diagnosis Date   Ankylosing spondylitis (HCC)    Anxiety     Arthritis    Blood in stool    Breast cancer (HCC) 2010   Left breast- chemo/radiation   Chronic low back pain    Chronic pain    Depression    Headache    Hepatitis C 2007   Hip fracture (HCC)    History of blood transfusion    Hypertension    Insomnia    OA (osteoarthritis)    Osteoporosis    Personal history of chemotherapy    Personal history of radiation therapy    Spinal stenosis of lumbar region    Spondyloarthritis     PAST SURGICAL HISTORY: Past Surgical History:  Procedure Laterality Date   BREAST BIOPSY Right 1983   neg   BREAST BIOPSY Right 09/19/2023   US  RT BREAST BX W LOC DEV 1ST LESION IMG BX SPEC US  GUIDE 09/19/2023 [facility]   BREAST EXCISIONAL BIOPSY Left 2010   triple neg   BREAST LUMPECTOMY Left 2010   FEMUR FRACTURE SURGERY     FOOT SURGERY  2000 and 2003   FRACTURE SURGERY     INTRAMEDULLARY (IM) NAIL INTERTROCHANTERIC Right 01/12/2015   Procedure: INTRAMEDULLARY (IM) NAIL INTERTROCHANTRIC;  Surgeon: Kayla Pinal, MD;  Location: [facility] ORS;  Service: Orthopedics;  Laterality: Right;   KNEE SURGERY Left 2004   SHOULDER ARTHROSCOPY WITH SUBACROMIAL DECOMPRESSION, ROTATOR CUFF REPAIR AND BICEP TENDON REPAIR Right 07/12/2021   Procedure: RIGHT SHOULDER ARTHROSCOPY WITH DEBRIDEMENT, DECOMPRESSION, ROTATOR CUFF REPAIR, AND  BICEPS TENODESIS.;  Surgeon: Edie Norleen PARAS, MD;  Location: [facility];  Service: Orthopedics;  Laterality: Right;   TUBAL LIGATION      FAMILY HISTORY: Family History  Problem  Relation Age of Onset   Arthritis Mother    Stroke Mother    Heart disease Mother    Hypertension Mother    Hyperlipidemia Mother    Depression Mother    Seizures Mother    Osteoporosis Mother    Hypertension Father    Seizures Sister    Stroke Sister    Heart disease Maternal Aunt    Hypertension Brother    Arthritis Brother    Depression Brother    Diabetes Brother    Hyperlipidemia Brother    COPD Neg Hx    Cancer Neg Hx     ADVANCED  DIRECTIVES (Y/N):  N  HEALTH MAINTENANCE: Social History   Tobacco Use   Smoking status: Every Day    Current packs/day: 0.20    Average packs/day: 0.2 packs/day for 25.0 years (5.0 ttl pk-yrs)    Types: Cigarettes   Smokeless tobacco: Never  Vaping Use   Vaping status: Never Used  Substance Use Topics   Alcohol use: Yes    Alcohol/week: 1.0 standard drink of alcohol    Types: 1 Glasses of wine per week    Comment: occasionally a glass of wine cooler   Drug use: Not Currently    Types: Marijuana     Colonoscopy:  PAP:  Bone density:  Lipid panel:  Allergies  Allergen Reactions   Tramadol  Nausea Only   Penicillins Rash and Other (See Comments)    Has patient had a PCN reaction causing immediate rash, facial/tongue/throat swelling, SOB or lightheadedness with hypotension: Yes Has patient had a PCN reaction causing severe rash involving mucus membranes or skin necrosis: No Has patient had a PCN reaction that required hospitalization No Has patient had a PCN reaction occurring within the last 10 years: No If all of the above answers are NO, then may proceed with Cephalosporin use.     Current Outpatient Medications  Medication Sig Dispense Refill   ALPRAZolam  (XANAX ) 0.25 MG tablet Take 1 tablet (0.25 mg total) by mouth at bedtime as needed for anxiety. 30 tablet 0   busPIRone  (BUSPAR ) 30 MG tablet Take 1 tablet by mouth 2 (two) times daily as needed.     carisoprodol  (SOMA ) 350 MG tablet Take 350 mg by mouth 2 (two) times daily as needed.     etanercept (ENBREL) 50 MG/ML injection Inject 1 mL into the skin once a week.     FLUoxetine  (PROZAC ) 20 MG capsule Take 20 mg by mouth daily.     hydrOXYzine (ATARAX) 50 MG tablet Take by mouth.     rosuvastatin (CRESTOR) 20 MG tablet      acetaminophen  (TYLENOL ) 325 MG tablet Take 2 tablets (650 mg total) by mouth every 6 (six) hours as needed for mild pain (pain score 1-3). (Patient not taking: Reported on 06/07/2023)      oxyCODONE  (ROXICODONE ) 5 MG immediate release tablet Take 1 tablet (5 mg total) by mouth every 8 (eight) hours as needed for moderate pain (pain score 4-6). (Patient not taking: Reported on 06/07/2023) 10 tablet 0   No current facility-administered medications for this visit.    OBJECTIVE: Vitals:   09/26/23 1405  BP: 130/80  Pulse: 86  Resp: 16  Temp: 98.3 F (36.8 C)  SpO2: 98%     Body mass index is 34.9 kg/m.    ECOG FS:0 - Asymptomatic  General: Well-developed, well-nourished, no acute distress. Eyes: Pink conjunctiva, anicteric sclera. HEENT: Normocephalic, moist mucous membranes. Lungs: No audible wheezing or coughing.  Heart: Regular rate and rhythm. Abdomen: Soft, nontender, no obvious distention. Musculoskeletal: No edema, cyanosis, or clubbing. Neuro: Alert, answering all questions appropriately. Cranial nerves grossly intact. Skin: No rashes or petechiae noted. Psych: Normal affect. Lymphatics: No cervical, calvicular, axillary or inguinal LAD.   LAB RESULTS:  Lab Results  Component Value Date   NA 144 06/14/2023   K 3.9 06/14/2023   CL 108 06/14/2023   CO2 23 06/14/2023   GLUCOSE 91 06/14/2023   BUN 19 06/14/2023   CREATININE 0.60 06/14/2023   CALCIUM 9.1 06/14/2023   PROT 7.5 06/14/2023   ALBUMIN 4.0 06/14/2023   AST 29 06/14/2023   ALT 30 06/14/2023   ALKPHOS 57 06/14/2023   BILITOT 0.5 06/14/2023   GFRNONAA >60 06/14/2023   GFRAA 58 (L) 08/29/2019    Lab Results  Component Value Date   WBC 5.5 06/14/2023   NEUTROABS 6.8 03/20/2023   HGB 13.6 06/14/2023   HCT 40.7 06/14/2023   MCV 94.2 06/14/2023   PLT 193 06/14/2023     STUDIES: US  RT BREAST BX W LOC DEV 1ST LESION IMG BX SPEC US  GUIDE Addendum Date: 09/20/2023 ADDENDUM REPORT: 09/20/2023 12:56 ADDENDUM: Pathology revealed GRADE II INVASIVE MAMMARY CARCINOMA, DUCTAL CARCINOMA IN SITU, SOLID, HIGH NUCLEAR GRADE, WITH FOCAL NECROSIS, OTHER FINDINGS: NONE of the RIGHT breast mass, 9 o'clock,  3cmfn, 4 mm, (ribbon clip) . This was found to be concordant by Dr. Norleen Croak. Pathology results were discussed with the patient by telephone. The patient reported doing well after the biopsy with mild tenderness at the site. Post biopsy instructions and care were reviewed and questions were answered. The patient was encouraged to call [facility] for any additional concerns. Consultations will be arranged by Therisa Ada, RN, Oncology Navigator at [facility] [place] location. Pathology results were sent to Therisa Ada via secure EPIC message on September 20, 2023. Patient request for Dr. Evalene PARAS. Shakea Isip at [facility] was communicated to [facility]. Pathology results reported by Hendricks Benders, RN on 09/20/2023. Electronically Signed   By: Norleen Croak M.D.   On: 09/20/2023 12:56   Result Date: 09/20/2023 CLINICAL DATA:  Suspicious RIGHT breast mass at 9 o'clock 3 cm from the nipple EXAM: ULTRASOUND GUIDED RIGHT BREAST CORE NEEDLE BIOPSY COMPARISON:  Previous exam(s). PROCEDURE: I met with the patient and we discussed the procedure of ultrasound-guided biopsy, including benefits and alternatives. We discussed the high likelihood of a successful procedure. We discussed the risks of the procedure, including infection, bleeding, tissue injury, clip migration, and inadequate sampling. Informed written consent was given. The usual time-out protocol was performed immediately prior to the procedure. Lesion quadrant: Upper-outer Using sterile technique and 1% lidocaine  and 1% lidocaine  with epinephrine  as local anesthetic, under direct ultrasound visualization, a 14 gauge spring-loaded device was used to perform biopsy of the RIGHT breast mass at 9 o'clock 3 cm from the nipple using a radial approach. At the conclusion of the procedure ribbon shaped tissue marker clip was deployed into the biopsy cavity. Follow up 2 view mammogram was  performed and dictated separately. IMPRESSION: Ultrasound guided biopsy of the RIGHT breast mass at 9 o'clock 3 cm from the nipple. No apparent complications. Electronically Signed: By: Norleen Croak M.D. On: 09/19/2023 11:52   MM CLIP PLACEMENT RIGHT Result Date: 09/19/2023 CLINICAL DATA:  Status post RIGHT breast ultrasound-guided biopsy EXAM: 3D DIAGNOSTIC RIGHT MAMMOGRAM POST ULTRASOUND BIOPSY COMPARISON:  Previous exam(s).  ACR Breast Density Category c: The breasts are heterogeneously dense, which may obscure small masses. FINDINGS: 3D Mammographic images were obtained following ultrasound guided biopsy of of a RIGHT breast mass at 9 o'clock 3 cm from the nipple. The biopsy marking clip is in expected position at the site of biopsy. IMPRESSION: Appropriate positioning of the ribbon shaped biopsy marking clip at the site of biopsy in the RIGHT breast at 9 o'clock. Final Assessment: Post Procedure Mammograms for Marker Placement Electronically Signed   By: Norleen Croak M.D.   On: 09/19/2023 12:18   MM 3D DIAGNOSTIC MAMMOGRAM BILATERAL BREAST Result Date: 09/12/2023 CLINICAL DATA:  Breast pain in the LEFT breast. History of remote LEFT lumpectomy and remote RIGHT surgical excision. EXAM: DIGITAL DIAGNOSTIC BILATERAL MAMMOGRAM WITH TOMOSYNTHESIS AND CAD; ULTRASOUND LEFT BREAST LIMITED; ULTRASOUND RIGHT BREAST LIMITED TECHNIQUE: Bilateral digital diagnostic mammography and breast tomosynthesis was performed. The images were evaluated with computer-aided detection. ; Targeted ultrasound examination of the left breast was performed.; Targeted ultrasound examination of the right breast was performed COMPARISON:  Previous exam(s). ACR Breast Density Category c: The breasts are heterogeneously dense, which may obscure small masses. FINDINGS: There is density and architectural distortion within the LEFT breast, consistent with postsurgical changes. These are stable in comparison to prior. No suspicious mammographic  etiology for LEFT breast pain is identified. No suspicious mass, distortion, or microcalcifications are identified to suggest presence of malignancy. In the RIGHT outer breast at middle depth, there is an area of architectural distortion. This is best seen on CC slice 50, MLO slice 60 and ML slice 58. Scar marker was placed and repeat imaging was performed. This architectural distortion does not definitively fall subjacent to the scar marker and is not definitively stable compared to more remote prior mammograms. It is near a coarse dystrophic calcification. On physical exam, no suspicious mass is appreciated at the site of painful concern in the LEFT inner breast. Targeted ultrasound was performed of the site of painful concern in the LEFT inner breast. No suspicious cystic or solid mass is seen at the site of painful concern. Targeted ultrasound was performed of the RIGHT outer breast. At 9 o'clock 3 cm from the nipple there is an irregular hypoechoic mass with irregular margins and posterior acoustic shadowing. This measures 4 x 6 x 5 mm. Utilizing adjacent landmarks, this is favored to correspond to the site of mammographic concern. Targeted ultrasound was performed of the RIGHT axilla. No suspicious axillary lymph nodes are visualized. IMPRESSION: 1. There is an indeterminate area of architectural distortion with a favored 6 mm sonographic correlate in the RIGHT outer breast. Recommend ultrasound-guided biopsy for definitive characterization with attention on post marker placement mammogram to assess for mammographic/sonographic correlation. 2. No suspicious RIGHT axillary adenopathy. 3. No mammographic or sonographic evidence of malignancy at the site of painful concern in the LEFT breast. No mammographic evidence of malignancy in the LEFT breast. Any further workup of the patient's symptoms should be based on the clinical assessment. 4. Breast pain is a common condition, which will often resolve on its own  without intervention. It can be affected by hormonal changes, medication side effect, weight changes and fit of the bra. Pain may also be referred from other adjacent areas of the body. Breast pain may be improved by wearing adequate well-fitting support, over-the-counter topical and oral NSAID medication, low-fat diet, and ice/heat as needed. Studies have shown an improvement in cyclic pain with use of evening primrose oil, vitamin D and  vitamin E. Clinical follow-up recommended to discuss any further work-up recommendations and appropriate treatment. RECOMMENDATION: RIGHT breast ultrasound-guided biopsy x1 I have discussed the findings and recommendations with the patient. The biopsy procedure was discussed with the patient and questions were answered. Patient expressed their understanding of the biopsy recommendation. Patient will be scheduled for biopsy at her earliest convenience by the schedulers. Ordering provider will be notified. If applicable, a reminder letter will be sent to the patient regarding the next appointment. BI-RADS CATEGORY  4: Suspicious. Electronically Signed   By: Corean Salter M.D.   On: 09/12/2023 15:56   US  LIMITED ULTRASOUND INCLUDING AXILLA LEFT BREAST  Result Date: 09/12/2023 CLINICAL DATA:  Breast pain in the LEFT breast. History of remote LEFT lumpectomy and remote RIGHT surgical excision. EXAM: DIGITAL DIAGNOSTIC BILATERAL MAMMOGRAM WITH TOMOSYNTHESIS AND CAD; ULTRASOUND LEFT BREAST LIMITED; ULTRASOUND RIGHT BREAST LIMITED TECHNIQUE: Bilateral digital diagnostic mammography and breast tomosynthesis was performed. The images were evaluated with computer-aided detection. ; Targeted ultrasound examination of the left breast was performed.; Targeted ultrasound examination of the right breast was performed COMPARISON:  Previous exam(s). ACR Breast Density Category c: The breasts are heterogeneously dense, which may obscure small masses. FINDINGS: There is density and  architectural distortion within the LEFT breast, consistent with postsurgical changes. These are stable in comparison to prior. No suspicious mammographic etiology for LEFT breast pain is identified. No suspicious mass, distortion, or microcalcifications are identified to suggest presence of malignancy. In the RIGHT outer breast at middle depth, there is an area of architectural distortion. This is best seen on CC slice 50, MLO slice 60 and ML slice 58. Scar marker was placed and repeat imaging was performed. This architectural distortion does not definitively fall subjacent to the scar marker and is not definitively stable compared to more remote prior mammograms. It is near a coarse dystrophic calcification. On physical exam, no suspicious mass is appreciated at the site of painful concern in the LEFT inner breast. Targeted ultrasound was performed of the site of painful concern in the LEFT inner breast. No suspicious cystic or solid mass is seen at the site of painful concern. Targeted ultrasound was performed of the RIGHT outer breast. At 9 o'clock 3 cm from the nipple there is an irregular hypoechoic mass with irregular margins and posterior acoustic shadowing. This measures 4 x 6 x 5 mm. Utilizing adjacent landmarks, this is favored to correspond to the site of mammographic concern. Targeted ultrasound was performed of the RIGHT axilla. No suspicious axillary lymph nodes are visualized. IMPRESSION: 1. There is an indeterminate area of architectural distortion with a favored 6 mm sonographic correlate in the RIGHT outer breast. Recommend ultrasound-guided biopsy for definitive characterization with attention on post marker placement mammogram to assess for mammographic/sonographic correlation. 2. No suspicious RIGHT axillary adenopathy. 3. No mammographic or sonographic evidence of malignancy at the site of painful concern in the LEFT breast. No mammographic evidence of malignancy in the LEFT breast. Any  further workup of the patient's symptoms should be based on the clinical assessment. 4. Breast pain is a common condition, which will often resolve on its own without intervention. It can be affected by hormonal changes, medication side effect, weight changes and fit of the bra. Pain may also be referred from other adjacent areas of the body. Breast pain may be improved by wearing adequate well-fitting support, over-the-counter topical and oral NSAID medication, low-fat diet, and ice/heat as needed. Studies have shown an improvement in cyclic pain with  use of evening primrose oil, vitamin D and vitamin E. Clinical follow-up recommended to discuss any further work-up recommendations and appropriate treatment. RECOMMENDATION: RIGHT breast ultrasound-guided biopsy x1 I have discussed the findings and recommendations with the patient. The biopsy procedure was discussed with the patient and questions were answered. Patient expressed their understanding of the biopsy recommendation. Patient will be scheduled for biopsy at her earliest convenience by the schedulers. Ordering provider will be notified. If applicable, a reminder letter will be sent to the patient regarding the next appointment. BI-RADS CATEGORY  4: Suspicious. Electronically Signed   By: Corean Salter M.D.   On: 09/12/2023 15:56   US  LIMITED ULTRASOUND INCLUDING AXILLA RIGHT BREAST Result Date: 09/12/2023 CLINICAL DATA:  Breast pain in the LEFT breast. History of remote LEFT lumpectomy and remote RIGHT surgical excision. EXAM: DIGITAL DIAGNOSTIC BILATERAL MAMMOGRAM WITH TOMOSYNTHESIS AND CAD; ULTRASOUND LEFT BREAST LIMITED; ULTRASOUND RIGHT BREAST LIMITED TECHNIQUE: Bilateral digital diagnostic mammography and breast tomosynthesis was performed. The images were evaluated with computer-aided detection. ; Targeted ultrasound examination of the left breast was performed.; Targeted ultrasound examination of the right breast was performed COMPARISON:   Previous exam(s). ACR Breast Density Category c: The breasts are heterogeneously dense, which may obscure small masses. FINDINGS: There is density and architectural distortion within the LEFT breast, consistent with postsurgical changes. These are stable in comparison to prior. No suspicious mammographic etiology for LEFT breast pain is identified. No suspicious mass, distortion, or microcalcifications are identified to suggest presence of malignancy. In the RIGHT outer breast at middle depth, there is an area of architectural distortion. This is best seen on CC slice 50, MLO slice 60 and ML slice 58. Scar marker was placed and repeat imaging was performed. This architectural distortion does not definitively fall subjacent to the scar marker and is not definitively stable compared to more remote prior mammograms. It is near a coarse dystrophic calcification. On physical exam, no suspicious mass is appreciated at the site of painful concern in the LEFT inner breast. Targeted ultrasound was performed of the site of painful concern in the LEFT inner breast. No suspicious cystic or solid mass is seen at the site of painful concern. Targeted ultrasound was performed of the RIGHT outer breast. At 9 o'clock 3 cm from the nipple there is an irregular hypoechoic mass with irregular margins and posterior acoustic shadowing. This measures 4 x 6 x 5 mm. Utilizing adjacent landmarks, this is favored to correspond to the site of mammographic concern. Targeted ultrasound was performed of the RIGHT axilla. No suspicious axillary lymph nodes are visualized. IMPRESSION: 1. There is an indeterminate area of architectural distortion with a favored 6 mm sonographic correlate in the RIGHT outer breast. Recommend ultrasound-guided biopsy for definitive characterization with attention on post marker placement mammogram to assess for mammographic/sonographic correlation. 2. No suspicious RIGHT axillary adenopathy. 3. No mammographic or  sonographic evidence of malignancy at the site of painful concern in the LEFT breast. No mammographic evidence of malignancy in the LEFT breast. Any further workup of the patient's symptoms should be based on the clinical assessment. 4. Breast pain is a common condition, which will often resolve on its own without intervention. It can be affected by hormonal changes, medication side effect, weight changes and fit of the bra. Pain may also be referred from other adjacent areas of the body. Breast pain may be improved by wearing adequate well-fitting support, over-the-counter topical and oral NSAID medication, low-fat diet, and ice/heat as needed. Studies have  shown an improvement in cyclic pain with use of evening primrose oil, vitamin D and vitamin E. Clinical follow-up recommended to discuss any further work-up recommendations and appropriate treatment. RECOMMENDATION: RIGHT breast ultrasound-guided biopsy x1 I have discussed the findings and recommendations with the patient. The biopsy procedure was discussed with the patient and questions were answered. Patient expressed their understanding of the biopsy recommendation. Patient will be scheduled for biopsy at her earliest convenience by the schedulers. Ordering provider will be notified. If applicable, a reminder letter will be sent to the patient regarding the next appointment. BI-RADS CATEGORY  4: Suspicious. Electronically Signed   By: Corean Salter M.D.   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  She has an appointment with surgery next week to discuss lumpectomy.  She would likely require adjuvant XRT and letrozole for 5 years.  Tumor size is only 6 mm,  therefore chemotherapy will be unlikely but will send Oncotype for completeness.  Return to clinic 2 weeks after surgery to discuss her final pathology results and treatment planning. 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  She did not require an aromatase inhibitor given the ER/PR negative nature of her malignancy. Anxiety: Patient was given 30 tabs of Xanax  with no refills to help her with insomnia and anxiety during her workup phase, but she will not receive any further refills from this clinic. Pain: Secondary to ankylosing spondylitis.  Patient is not to receive any pain medication from this clinic.  I spent a total of 60 minutes reviewing chart data, face-to-face evaluation with the patient, counseling and coordination of care as detailed above.   Patient expressed understanding and was in agreement with this plan. She also understands that She can call clinic at any time with any questions, concerns, or complaints.    Cancer Staging  Invasive ductal carcinoma of right breast [facility]) Staging form: Breast, AJCC 8th Edition - Clinical stage from 09/27/2023: Stage IA (cT1b, cN0, cM0, G2, ER+, PR+, HER2-) - Signed by Jacobo Evalene PARAS, MD on 09/27/2023 Histologic grading system: 3 grade system   Evalene PARAS Jacobo, MD   09/27/2023 12:22 PM

## 2023-09-27 ENCOUNTER — Ambulatory Visit: Admitting: Pediatrics

## 2023-09-27 DIAGNOSIS — C50911 Malignant neoplasm of unspecified site of right female breast: Secondary | ICD-10-CM | POA: Insufficient documentation

## 2023-10-01 ENCOUNTER — Telehealth: Payer: Self-pay | Admitting: *Deleted

## 2023-10-01 NOTE — Telephone Encounter (Signed)
 RN received message from radiation oncology that patient requesting increase in her dose of Xanax . Patient was given a one time prescription for Xanax  0.25 mg tablets on 9/24, quantity #30. Dr. Jacobo discussed with patient that no further refills would be given and no adjustments would be made to dose. RN advised again on providers response, Xanax  is only a one time prescription and dose will not be increased. Patient verbalized understanding.

## 2023-10-02 ENCOUNTER — Other Ambulatory Visit: Payer: Self-pay | Admitting: General Surgery

## 2023-10-02 ENCOUNTER — Ambulatory Visit (INDEPENDENT_AMBULATORY_CARE_PROVIDER_SITE_OTHER): Admitting: General Surgery

## 2023-10-02 ENCOUNTER — Other Ambulatory Visit: Payer: Self-pay

## 2023-10-02 ENCOUNTER — Encounter: Payer: Self-pay | Admitting: General Surgery

## 2023-10-02 VITALS — BP 148/90 | HR 84 | Ht 59.5 in | Wt 167.0 lb

## 2023-10-02 DIAGNOSIS — C50811 Malignant neoplasm of overlapping sites of right female breast: Secondary | ICD-10-CM | POA: Diagnosis not present

## 2023-10-02 DIAGNOSIS — C50911 Malignant neoplasm of unspecified site of right female breast: Secondary | ICD-10-CM

## 2023-10-02 DIAGNOSIS — Z170 Estrogen receptor positive status [ER+]: Secondary | ICD-10-CM

## 2023-10-02 DIAGNOSIS — C50919 Malignant neoplasm of unspecified site of unspecified female breast: Secondary | ICD-10-CM

## 2023-10-02 NOTE — Progress Notes (Signed)
 Patient ID: Janet Zuniga, female   DOB: 1955/08/17, 68 y.o.   MRN: [id number] CC: Right Breast Cancer History of Present Illness Janet Zuniga is a 68 y.o. female with .  Past medical history significant for rheumatoid arthritis, anxiety and history of left breast cancer who presents in consultation for right invasive mammary carcinoma.  The patient reports that she was having left breast pain for several months so she went for a diagnostic mammogram.  On her diagnostic mammogram they found an area of concern within her right breast.  She did not have any areas of concern in her left breast.  She then underwent biopsy of right breast lesion that returned ER/PR positive invasive mammary carcinoma.  She presents in consultation today for treatment options.  The patient reports that she did not notice any lumps in her right breast.  She denies any overlying skin changes.  She denies any nipple retraction or nipple discharge on her right breast.  She does report that she has had left nipple retraction since her surgery for her left breast cancer.   Unfortunately I am unable to secure the records from her left breast mastectomy.  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  She then had a lumpectomy with sentinel lymph node biopsy followed by radiation therapy.  She did not have any endocrine therapy. Past Medical History Past Medical History:  Diagnosis Date   Ankylosing spondylitis (HCC)    Anxiety    Arthritis    Blood in stool    Breast cancer (HCC) 2010   Left breast- chemo/radiation   Chronic low back pain    Chronic pain    Depression    Headache    Hepatitis C 2007   Hip fracture (HCC)    History of blood transfusion    Hypertension    Insomnia    OA (osteoarthritis)    Osteoporosis    Personal history of chemotherapy    Personal history of radiation therapy    Spinal stenosis of lumbar region    Spondyloarthritis         Past Surgical History:  Procedure Laterality Date   BREAST BIOPSY Right 1983   neg   BREAST BIOPSY Right 09/19/2023   US  RT BREAST BX W LOC DEV 1ST LESION IMG BX SPEC US  GUIDE 09/19/2023 [facility]   BREAST EXCISIONAL BIOPSY Left 2010   triple neg   BREAST LUMPECTOMY Left 2010   FEMUR FRACTURE SURGERY     FOOT SURGERY  2000 and 2003   FRACTURE SURGERY     INTRAMEDULLARY (IM) NAIL INTERTROCHANTERIC Right 01/12/2015   Procedure: INTRAMEDULLARY (IM) NAIL INTERTROCHANTRIC;  Surgeon: Kayla Pinal, MD;  Location: [facility];  Service: Orthopedics;  Laterality: Right;   KNEE SURGERY Left 2004   SHOULDER ARTHROSCOPY WITH SUBACROMIAL DECOMPRESSION, ROTATOR CUFF REPAIR AND BICEP TENDON REPAIR Right 07/12/2021   Procedure: RIGHT SHOULDER ARTHROSCOPY WITH DEBRIDEMENT, DECOMPRESSION, ROTATOR CUFF REPAIR, AND  BICEPS TENODESIS.;  Surgeon: Edie Norleen PARAS, MD;  Location: [facility];  Service: Orthopedics;  Laterality: Right;   TUBAL LIGATION      Allergies  Allergen Reactions   Tramadol  Nausea Only   Penicillins Rash and Other (See Comments)    Has patient had a PCN reaction causing immediate rash, facial/tongue/throat swelling, SOB or lightheadedness with hypotension: Yes Has patient had a PCN reaction causing severe rash involving mucus membranes or skin necrosis: No Has patient had a PCN  reaction that required hospitalization No Has patient had a PCN reaction occurring within the last 10 years: No If all of the above answers are NO, then may proceed with Cephalosporin use.     Current Outpatient Medications  Medication Sig Dispense Refill   busPIRone  (BUSPAR ) 30 MG tablet Take 1 tablet by mouth 2 (two) times daily as needed.     FLUoxetine  (PROZAC ) 20 MG capsule Take 20 mg by mouth daily.     hydrOXYzine (ATARAX) 50 MG tablet Take by mouth.     rosuvastatin (CRESTOR) 20 MG tablet      No current facility-administered medications for this visit.    Family History Family  History  Problem Relation Age of Onset   Arthritis Mother    Stroke Mother    Heart disease Mother    Hypertension Mother    Hyperlipidemia Mother    Depression Mother    Seizures Mother    Osteoporosis Mother    Hypertension Father    Seizures Sister    Stroke Sister    Heart disease Maternal Aunt    Hypertension Brother    Arthritis Brother    Depression Brother    Diabetes Brother    Hyperlipidemia Brother    COPD Neg Hx    Cancer Neg Hx        Social History Social History   Tobacco Use   Smoking status: Every Day    Current packs/day: 0.20    Average packs/day: 0.2 packs/day for 25.0 years (5.0 ttl pk-yrs)    Types: Cigarettes    Passive exposure: Past   Smokeless tobacco: Never  Vaping Use   Vaping status: Never Used  Substance Use Topics   Alcohol use: Yes    Alcohol/week: 1.0 standard drink of alcohol    Types: 1 Glasses of wine per week    Comment: occasionally a glass of wine cooler   Drug use: Not Currently    Types: Marijuana        ROS Full ROS of systems performed and is otherwise negative there than what is stated in the HPI  Physical Exam Blood pressure (!) 148/90, pulse 84, height 4' 11.5 (1.511 m), weight 167 lb (75.8 kg), SpO2 99%.  Alert and oriented x 3, normal work of breathing on room air, anxious and tearful during parts of the exam, abdomen is soft, nontender nondistended  Breast exam performed in the presence of a chaperone.  Left breast she does have some nipple retraction and there is a well-healed surgical scar.  There is no other skin changes and no discharge from her nipple.  I do not feel any left axillary lymphadenopathy.  On her right breast her biopsy sites are healed well.  She has no nipple retraction or nipple discharge.  There is no overlying skin changes and I do not feel any axillary masses or lesions. Data Reviewed I have reviewed her mammogram and there is a small lesion in her right breast approximately 3 cm from  her nipple at the 9 o'clock position.  Her recent mammogram shows that this has been biopsied.  Her pathology results are positive for ER/PR positive invasive mammary carcinoma.  I have personally reviewed the patient's imaging and medical records.    Assessment    Patient with right breast invasive mammary carcinoma.  She also has a history of left breast cancer.  Plan    I had a long discussion with the patient about the treatment options for breast cancer.  Given that she has no concerning axillary lymph nodes and she is ER/PR positive the for step would be surgical resection.  The surgical resection can either involve a lumpectomy with sentinel lymph node biopsy.  I discussed with her that if we go this route then it would be recommended that she undergo radiation therapy.  I also discussed the option of either single mastectomy or bilateral mastectomy both we would perform a sentinel lymph node biopsy on the right side.  I also discussed with her that if she wants to undergo mastectomy then we could refer her to plastic and reconstructive surgery for her reconstructive options.  She is a current smoker and smokes a little under a pack per day.  I talked with her that I was unsure about the comfort level of our plastic and reconstructive surgery team given that she is an active smoker.  I briefly discussed the risk, benefits alternatives of both of the procedures of the sentinel lymph node biopsy and lumpectomy versus the right mastectomy or bilateral mastectomy.  She understands in both the cases the risk of infection, bleeding, positive margins, need for chemotherapy based on lymph node status, wound healing problems for which she is at increased risk given her smoking status.  Once we have input from our plastic and reconstructive surgery team we will determine the best course of action for her and proceed with surgery.  I also discussed with her that if she has any questions she can call our office  or if she changes her mind and wants to opt for a lumpectomy with sentinel lymph node biopsy we can do the for which she will need a Savi scout placed.  A total of 70 minutes was spent reviewing the patient's chart, performing history and physical and discussing treatment options with the patient.   Jayson MALVA Endow 10/02/2023, 2:09 PM

## 2023-10-02 NOTE — Patient Instructions (Addendum)
 We have sent a referral to Plastic Surgery for them to see you. They will call you to schedule this appointment.   We have you follow up here after you see Plastic Surgery. Please call us  to schedule this appointment.     Skin Sparing Mastectomy A mastectomy is a surgery to remove a breast. Skin sparing mastectomy (SSM) is a technique that can be used during a mastectomy. When this technique is used, all of the skin over the breast is left intact, while all the underlying breast tissue, the nipple, and sometimes the areola are removed. The technique allows the breast to be reconstructed during the surgery. Most women with breast cancer can have this type of surgery unless their cancer is close to or involves the skin over the breast. Tell a health care provider about: Any allergies you have. All medicines you are taking, including vitamins, herbs, supplements, eye drops, creams, and over-the-counter medicines. Any problems you or family members have had with anesthetic medicines. Any bleeding problems you have. Any surgeries you have had. Any medical conditions you have. Whether you are pregnant or may be pregnant. What are the risks? Generally, this is a safe procedure. However, problems may occur, including: Infection. Bleeding. Allergic reactions to medicines or dyes. The formation of a pocket of clear fluid (seroma). A skin infection caused by bacteria (cellulitis). Damage to nearby structures or organs. A collapsed lung (pneumothorax). This is rare. Other risks include: Death of tissue (necrosis). People who use tobacco products are at increased risk for this problem. Blood clots. Nerve damage due to poor positioning of the arm during surgery. What happens before the procedure? When to stop eating and drinking Follow instructions from your health care provider about eating and drinking, which may include: 8 hours before your procedure Stop eating most foods. Do not eat meat,  fried foods, or fatty foods. Eat only light foods, such as toast or crackers. All liquids are okay except energy drinks and alcohol. 6 hours before your procedure Stop eating. Drink only clear liquids, such as water, clear fruit juice, black coffee, plain tea, and sports drinks. Do not drink energy drinks or alcohol. 2 hours before your procedure Stop drinking all liquids. You may be allowed to take medicines with small sips of water. If you do not follow your health care provider's instructions, your procedure may be delayed or canceled. Medicines Ask your health care provider about: Changing or stopping your regular medicines. These include any diabetes medicines or blood thinners you take. Taking medicines such as aspirin  and ibuprofen . These medicines can thin your blood. Do not take these medicines unless your health care provider tells you to take them. Taking over-the-counter medicines, vitamins, herbs, and supplements. Surgery safety Ask your health care provider: How your surgery site will be marked. What steps will be taken to help prevent infection. These may include: Washing skin with a germ-killing soap. Taking antibiotic medicine. General instructions You will most often be checked for abnormal lymph nodes in the area around the affected breast during the surgery. Do not use any products that contain nicotine or tobacco for at least 4 weeks before the procedure. These products include cigarettes, chewing tobacco, and vaping devices, such as e-cigarettes. If you need help quitting, ask your health care provider. If you will be going home right after the procedure, plan to have a responsible adult: Take you home from the hospital or clinic. You will not be allowed to drive. Care for you for the  time you are told. What happens during the procedure?  An IV will be inserted into one of your veins. You will be given one or more of the following: A medicine to help you relax  (sedative). A medicine to numb the area (local anesthetic). A medicine to make you fall asleep (general anesthetic). An incision will be made. The underlying breast tissue, the nipple, and possibly the areola will be removed. Tissue from the area may be sent to be examined under a microscope to look for cancer cells (biopsy). Lymph nodes in the area may be checked for cancer and removed if necessary. Skin around the breast will be preserved to form a pocket with flaps that will be developed to insert a breast implant. An implant or tissue from another part of your body may be inserted into the pocket made by the flaps. A tube will be placed to drain blood and fluids that collect in the surgical area. The incision will be closed with sutures (stitches), skin glue, or skin adhesive strips. A gauze bandage (dressing) will be put over the incision. The procedure may vary among health care providers and hospitals. What happens after the procedure? Your blood pressure, heart rate, breathing rate, and blood oxygen level will be monitored until you leave the hospital or clinic. You will be given pain medicine as needed. You may stay at the hospital at least overnight so that your health care providers can make sure. You are able to tolerate pain-relieving medicines and other medicines. You are able to eat. You are hydrated. You will be told how to care for your drain and incisions at home. Do not drive until your health care provider says that it is safe. Summary Skin sparing mastectomy is a technique that allows the breast to be reconstructed during a surgery that is done to remove a breast. For the procedure, you will be given a medicine to make you fall asleep. You may need to stay in the hospital at least overnight after the procedure. This information is not intended to replace advice given to you by your health care provider. Make sure you discuss any questions you have with your health care  provider. Document Revised: 01/24/2021 Document Reviewed: 01/24/2021 Elsevier Patient Education  2024 [employer].

## 2023-10-03 ENCOUNTER — Encounter: Payer: Self-pay | Admitting: *Deleted

## 2023-10-03 ENCOUNTER — Telehealth: Payer: Self-pay | Admitting: General Surgery

## 2023-10-03 NOTE — Telephone Encounter (Signed)
 Patient has been advised of Pre-Admission date/time, and Surgery date at [facility].  Surgery Date: 10/10/23 Preadmission Testing Date: 10/08/23 (phone 8a-1p)  Patient has been informed to arrive at 9:45 am at [facility] on 10/10/23 as will be getting SLN bx done first prior to surgery.    Patient also reminded of breast tag placement at [facility] 10/04/23.

## 2023-10-03 NOTE — Progress Notes (Signed)
 Lumpectomy scheduled for 10/8.  She will see Dr. Jacobo and Dr. Lenn on 10/29.

## 2023-10-04 ENCOUNTER — Ambulatory Visit
Admission: RE | Admit: 2023-10-04 | Discharge: 2023-10-04 | Disposition: A | Discharge status: Home / Self Care | Source: Ambulatory Visit | Attending: General Surgery | Admitting: General Surgery

## 2023-10-04 ENCOUNTER — Inpatient Hospital Stay: Attending: Oncology

## 2023-10-04 DIAGNOSIS — M810 Age-related osteoporosis without current pathological fracture: Secondary | ICD-10-CM | POA: Insufficient documentation

## 2023-10-04 DIAGNOSIS — C50919 Malignant neoplasm of unspecified site of unspecified female breast: Secondary | ICD-10-CM | POA: Diagnosis present

## 2023-10-04 DIAGNOSIS — Z8701 Personal history of pneumonia (recurrent): Secondary | ICD-10-CM | POA: Insufficient documentation

## 2023-10-04 DIAGNOSIS — Z885 Allergy status to narcotic agent status: Secondary | ICD-10-CM | POA: Insufficient documentation

## 2023-10-04 DIAGNOSIS — Z820 Family history of epilepsy and other diseases of the nervous system: Secondary | ICD-10-CM | POA: Insufficient documentation

## 2023-10-04 DIAGNOSIS — M549 Dorsalgia, unspecified: Secondary | ICD-10-CM | POA: Insufficient documentation

## 2023-10-04 DIAGNOSIS — Z1732 Human epidermal growth factor receptor 2 negative status: Secondary | ICD-10-CM | POA: Insufficient documentation

## 2023-10-04 DIAGNOSIS — Z1721 Progesterone receptor positive status: Secondary | ICD-10-CM | POA: Insufficient documentation

## 2023-10-04 DIAGNOSIS — Z833 Family history of diabetes mellitus: Secondary | ICD-10-CM | POA: Insufficient documentation

## 2023-10-04 DIAGNOSIS — Z83438 Family history of other disorder of lipoprotein metabolism and other lipidemia: Secondary | ICD-10-CM | POA: Insufficient documentation

## 2023-10-04 DIAGNOSIS — M459 Ankylosing spondylitis of unspecified sites in spine: Secondary | ICD-10-CM | POA: Insufficient documentation

## 2023-10-04 DIAGNOSIS — Z818 Family history of other mental and behavioral disorders: Secondary | ICD-10-CM | POA: Insufficient documentation

## 2023-10-04 DIAGNOSIS — Z880 Allergy status to penicillin: Secondary | ICD-10-CM | POA: Insufficient documentation

## 2023-10-04 DIAGNOSIS — M1610 Unilateral primary osteoarthritis, unspecified hip: Secondary | ICD-10-CM | POA: Insufficient documentation

## 2023-10-04 DIAGNOSIS — Z923 Personal history of irradiation: Secondary | ICD-10-CM | POA: Insufficient documentation

## 2023-10-04 DIAGNOSIS — Z9221 Personal history of antineoplastic chemotherapy: Secondary | ICD-10-CM | POA: Insufficient documentation

## 2023-10-04 DIAGNOSIS — I10 Essential (primary) hypertension: Secondary | ICD-10-CM | POA: Insufficient documentation

## 2023-10-04 DIAGNOSIS — M25611 Stiffness of right shoulder, not elsewhere classified: Secondary | ICD-10-CM | POA: Insufficient documentation

## 2023-10-04 DIAGNOSIS — Z823 Family history of stroke: Secondary | ICD-10-CM | POA: Insufficient documentation

## 2023-10-04 DIAGNOSIS — Z8619 Personal history of other infectious and parasitic diseases: Secondary | ICD-10-CM | POA: Insufficient documentation

## 2023-10-04 DIAGNOSIS — Z79899 Other long term (current) drug therapy: Secondary | ICD-10-CM | POA: Insufficient documentation

## 2023-10-04 DIAGNOSIS — Z8261 Family history of arthritis: Secondary | ICD-10-CM | POA: Insufficient documentation

## 2023-10-04 DIAGNOSIS — Z8249 Family history of ischemic heart disease and other diseases of the circulatory system: Secondary | ICD-10-CM | POA: Insufficient documentation

## 2023-10-04 DIAGNOSIS — Z8262 Family history of osteoporosis: Secondary | ICD-10-CM | POA: Insufficient documentation

## 2023-10-04 DIAGNOSIS — Z170 Estrogen receptor positive status [ER+]: Secondary | ICD-10-CM | POA: Insufficient documentation

## 2023-10-04 DIAGNOSIS — C50411 Malignant neoplasm of upper-outer quadrant of right female breast: Secondary | ICD-10-CM | POA: Insufficient documentation

## 2023-10-04 DIAGNOSIS — Z9851 Tubal ligation status: Secondary | ICD-10-CM | POA: Insufficient documentation

## 2023-10-04 DIAGNOSIS — F17210 Nicotine dependence, cigarettes, uncomplicated: Secondary | ICD-10-CM | POA: Insufficient documentation

## 2023-10-04 DIAGNOSIS — F419 Anxiety disorder, unspecified: Secondary | ICD-10-CM | POA: Insufficient documentation

## 2023-10-04 DIAGNOSIS — G8929 Other chronic pain: Secondary | ICD-10-CM | POA: Insufficient documentation

## 2023-10-04 HISTORY — PX: BREAST BIOPSY: SHX20

## 2023-10-04 MED ORDER — LIDOCAINE HCL 1 % IJ SOLN
10.0000 mL | Freq: Once | INTRAMUSCULAR | Status: AC
  Administered 2023-10-04: 10 mL
  Filled 2023-10-04: qty 10

## 2023-10-04 NOTE — Progress Notes (Signed)
 [facility] Clinical Social Work  Clinical Social Work was referred by Engineer, civil (consulting) for financial concerns.  Clinical Social Worker contacted patient by phone to offer support and assess for needs.    Interventions: Referred patient to community resources: including [facility] Patient Customer Service and [facility] Department of Social Services to apply for [place].  Patient reports receiving cancer treatment fifteen years ago.  She does receive food stamps and may be eligible for the [facility] once she begins treatment.       Follow Up Plan:  CSW will follow-up with patient by phone     Macario CHRISTELLA Au, LCSW  Clinical Social Worker [facility]

## 2023-10-07 ENCOUNTER — Ambulatory Visit: Payer: Self-pay | Admitting: General Surgery

## 2023-10-07 DIAGNOSIS — C50911 Malignant neoplasm of unspecified site of right female breast: Secondary | ICD-10-CM

## 2023-10-08 ENCOUNTER — Other Ambulatory Visit: Payer: Self-pay

## 2023-10-08 ENCOUNTER — Encounter
Admission: RE | Admit: 2023-10-08 | Discharge: 2023-10-08 | Disposition: A | Discharge status: Home / Self Care | Source: Ambulatory Visit | Attending: General Surgery | Admitting: General Surgery

## 2023-10-08 DIAGNOSIS — Z01812 Encounter for preprocedural laboratory examination: Secondary | ICD-10-CM

## 2023-10-08 DIAGNOSIS — Z01810 Encounter for preprocedural cardiovascular examination: Secondary | ICD-10-CM

## 2023-10-08 DIAGNOSIS — I10 Essential (primary) hypertension: Secondary | ICD-10-CM

## 2023-10-08 DIAGNOSIS — C50911 Malignant neoplasm of unspecified site of right female breast: Secondary | ICD-10-CM

## 2023-10-08 HISTORY — DX: Pneumonia, unspecified organism: J18.9

## 2023-10-08 NOTE — Patient Instructions (Addendum)
 Your procedure is scheduled on: 10/10/23  Report to the Registration Desk on the 1st floor of the Medical Mall. Patient has been informed to arrive at 9:45 am at [facility] on 10[facility]10/23 as will be getting SENTINEL LYMPH NODE (SLN) Biopsy (bx) done first prior to surgery.  If your arrival time is 6:00 am, do not arrive before that time as the Medical Mall entrance doors do not open until 6:00 am.  REMEMBER: Instructions that are not followed completely may result in serious medical risk, up to and including death; or upon the discretion of your surgeon and anesthesiologist your surgery may need to be rescheduled.  Do not eat food or drink any liquids after midnight the night before surgery.  No gum chewing or hard candies.  One week prior to surgery: Stop Anti-inflammatories (NSAIDS) such as Advil , Aleve , Ibuprofen , Motrin , Naproxen , Naprosyn  and Aspirin  based products such as Excedrin, Goody's Powder, BC Powder. You may take Tylenol  if needed for pain up until the day of surgery.  Stop ANY OVER THE COUNTER supplements until after surgery.  ON THE DAY OF SURGERY ONLY TAKE THESE MEDICATIONS WITH SIPS OF WATER:  busPIRone  (BUSPAR )  FLUoxetine  (PROZAC )    No Alcohol for 24 hours before or after surgery.  No Smoking including e-cigarettes for 24 hours before surgery.  No chewable tobacco products for at least 6 hours before surgery.  No nicotine patches on the day of surgery.  Do not use any recreational drugs for at least a week (preferably 2 weeks) before your surgery.  Please be advised that the combination of cocaine and anesthesia may have negative outcomes, up to and including death. If you test positive for cocaine, your surgery will be cancelled.  On the morning of surgery brush your teeth with toothpaste and water, you may rinse your mouth with mouthwash if you wish. Do not swallow any toothpaste or mouthwash.  Use CHG Soap or wipes as directed on instruction sheet.  Do not wear  jewelry, make-up, hairpins, clips or nail polish.  For welded (permanent) jewelry: bracelets, anklets, waist bands, etc.  Please have this removed prior to surgery.  If it is not removed, there is a chance that hospital personnel will need to cut it off on the day of surgery.  Do not wear lotions, powders, or perfumes.   Do not shave body hair from the neck down 48 hours before surgery.  Contact lenses, hearing aids and dentures may not be worn into surgery.  Do not bring valuables to the hospital. [facility] is not responsible for any missing/lost belongings or valuables.   Notify your doctor if there is any change in your medical condition (cold, fever, infection).  Wear comfortable clothing (specific to your surgery type) to the hospital.  After surgery, you can help prevent lung complications by doing breathing exercises.  Take deep breaths and cough every 1-2 hours. Your doctor may order a device called an Incentive Spirometer to help you take deep breaths.  When coughing or sneezing, hold a pillow firmly against your incision with both hands. This is called "splinting." Doing this helps protect your incision. It also decreases belly discomfort.  If you are being admitted to the hospital overnight, leave your suitcase in the car. After surgery it may be brought to your room.  In case of increased patient census, it may be necessary for you, the patient, to continue your postoperative care in the Same Day Surgery department.  If you are being discharged the  day of surgery, you will not be allowed to drive home. You will need a responsible individual to drive you home and stay with you for 24 hours after surgery.   If you are taking public transportation, you will need to have a responsible individual with you.  Please call the Pre-admissions Testing Dept. at [phone] if you have any questions about these instructions.  Surgery Visitation Policy:  Patients having surgery  or a procedure may have two visitors.  Children under the age of 108 must have an adult with them who is not the patient.  Inpatient Visitation:    Visiting hours are 7 a.m. to 8 p.m. Up to four visitors are allowed at one time in a patient room. The visitors may rotate out with other people during the day.  One visitor age 71 or older may stay with the patient overnight and must be in the room by 8 p.m.   Merchandiser, retail to address health-related social needs:  [email or web address]                                                                                                            Preparing for Surgery with CHLORHEXIDINE  GLUCONATE (CHG) Soap  Chlorhexidine  Gluconate (CHG) Soap  o An antiseptic cleaner that kills germs and bonds with the skin to continue killing germs even after washing  o Used for showering the night before surgery and morning of surgery  Before surgery, you can play an important role by reducing the number of germs on your skin.  CHG (Chlorhexidine  gluconate) soap is an antiseptic cleanser which kills germs and bonds with the skin to continue killing germs even after washing.  Please do not use if you have an allergy to CHG or antibacterial soaps. If your skin becomes reddened/irritated stop using the CHG.  1. Shower the NIGHT BEFORE SURGERY with CHG soap.  2. If you choose to wash your hair, wash your hair first as usual with your normal shampoo.  3. After shampooing, rinse your hair and body thoroughly to remove the shampoo.  4. Use CHG as you would any other liquid soap. You can apply CHG directly to the skin and wash gently with a clean washcloth.  5. Apply the CHG soap to your body only from the neck down. Do not use on open wounds or open sores. Avoid contact with your eyes, ears, mouth, and genitals (private parts). Wash face and genitals (private parts) with your normal soap.  6. Wash thoroughly, paying special attention to  the area where your surgery will be performed.  7. Thoroughly rinse your body with warm water.  8. Do not shower/wash with your normal soap after using and rinsing off the CHG soap.  9. Do not use lotions, oils, etc., after showering with CHG.  10. Pat yourself dry with a clean towel.  11. Wear clean pajamas to bed the night before surgery.  12. Place clean sheets on your bed the night of your shower and do not sleep with pets.  13.  Do not apply any deodorants/lotions/powders.  14. Please wear clean clothes to the hospital.  15. Remember to brush your teeth with your regular toothpaste.

## 2023-10-10 ENCOUNTER — Ambulatory Visit
Admission: RE | Admit: 2023-10-10 | Discharge: 2023-10-10 | Disposition: A | Discharge status: Home / Self Care | Attending: General Surgery | Admitting: General Surgery

## 2023-10-10 ENCOUNTER — Ambulatory Visit

## 2023-10-10 ENCOUNTER — Ambulatory Visit
Admission: RE | Admit: 2023-10-10 | Discharge: 2023-10-10 | Disposition: A | Discharge status: Home / Self Care | Source: Ambulatory Visit | Attending: General Surgery | Admitting: General Surgery

## 2023-10-10 ENCOUNTER — Other Ambulatory Visit: Payer: Self-pay

## 2023-10-10 ENCOUNTER — Encounter: Payer: Self-pay | Admitting: General Surgery

## 2023-10-10 ENCOUNTER — Ambulatory Visit
Admission: RE | Admit: 2023-10-10 | Discharge: 2023-10-10 | Disposition: A | Source: Ambulatory Visit | Attending: General Surgery

## 2023-10-10 ENCOUNTER — Encounter
Admission: RE | Disposition: A | Discharge status: Home / Self Care | Payer: Self-pay | Source: Home / Self Care | Attending: General Surgery

## 2023-10-10 DIAGNOSIS — Z01812 Encounter for preprocedural laboratory examination: Secondary | ICD-10-CM

## 2023-10-10 DIAGNOSIS — Z923 Personal history of irradiation: Secondary | ICD-10-CM | POA: Diagnosis not present

## 2023-10-10 DIAGNOSIS — Z170 Estrogen receptor positive status [ER+]: Secondary | ICD-10-CM

## 2023-10-10 DIAGNOSIS — Z9012 Acquired absence of left breast and nipple: Secondary | ICD-10-CM | POA: Diagnosis not present

## 2023-10-10 DIAGNOSIS — C50811 Malignant neoplasm of overlapping sites of right female breast: Secondary | ICD-10-CM | POA: Diagnosis not present

## 2023-10-10 DIAGNOSIS — C50911 Malignant neoplasm of unspecified site of right female breast: Secondary | ICD-10-CM | POA: Insufficient documentation

## 2023-10-10 DIAGNOSIS — F17210 Nicotine dependence, cigarettes, uncomplicated: Secondary | ICD-10-CM | POA: Diagnosis not present

## 2023-10-10 DIAGNOSIS — Z9221 Personal history of antineoplastic chemotherapy: Secondary | ICD-10-CM | POA: Insufficient documentation

## 2023-10-10 DIAGNOSIS — C50919 Malignant neoplasm of unspecified site of unspecified female breast: Secondary | ICD-10-CM

## 2023-10-10 DIAGNOSIS — Z01810 Encounter for preprocedural cardiovascular examination: Secondary | ICD-10-CM

## 2023-10-10 DIAGNOSIS — I10 Essential (primary) hypertension: Secondary | ICD-10-CM

## 2023-10-10 HISTORY — PX: AXILLARY SENTINEL NODE BIOPSY: SHX5738

## 2023-10-10 HISTORY — PX: BREAST LUMPECTOMY WITH RADIO FREQUENCY LOCALIZER: SHX6897

## 2023-10-10 LAB — BASIC METABOLIC PANEL WITH GFR
Anion gap: 11 (ref 5–15)
BUN: 24 mg/dL — ABNORMAL HIGH (ref 8–23)
CO2: 20 mmol/L — ABNORMAL LOW (ref 22–32)
Calcium: 8.7 mg/dL — ABNORMAL LOW (ref 8.9–10.3)
Chloride: 107 mmol/L (ref 98–111)
Creatinine, Ser: 0.66 mg/dL (ref 0.44–1.00)
GFR, Estimated: >60 mL/min (ref >60)
Glucose, Bld: 120 mg/dL — ABNORMAL HIGH (ref 70–99)
Potassium: 3.6 mmol/L (ref 3.5–5.1)
Sodium: 138 mmol/L (ref 135–145)

## 2023-10-10 LAB — CBC
HCT: 36.8 % (ref 36.0–46.0)
Hemoglobin: 12.6 g/dL (ref 12.0–15.0)
MCH: 31.7 pg (ref 26.0–34.0)
MCHC: 34.2 g/dL (ref 30.0–36.0)
MCV: 92.7 fL (ref 80.0–100.0)
Platelets: 188 K/uL (ref 150–400)
RBC: 3.97 MIL/uL (ref 3.87–5.11)
RDW: 13.2 % (ref 11.5–15.5)
WBC: 9 K/uL (ref 4.0–10.5)
nRBC: 0 % (ref 0.0–0.2)

## 2023-10-10 SURGERY — BREAST LUMPECTOMY WITH RADIO FREQUENCY LOCALIZER
Anesthesia: General | Site: Breast | Laterality: Right

## 2023-10-10 MED ORDER — CEFAZOLIN SODIUM-DEXTROSE 2-4 GM/100ML-% IV SOLN
2.0000 g | INTRAVENOUS | Status: AC
  Administered 2023-10-10: 2 g via INTRAVENOUS

## 2023-10-10 MED ORDER — OXYCODONE HCL 5 MG/5ML PO SOLN: 5.0000 mg | Freq: Once | ORAL | Status: AC | PRN

## 2023-10-10 MED ORDER — DEXAMETHASONE SODIUM PHOSPHATE 10 MG/ML IJ SOLN
INTRAMUSCULAR | Status: AC
  Filled 2023-10-10: qty 1

## 2023-10-10 MED ORDER — GLYCOPYRROLATE 0.2 MG/ML IJ SOLN
INTRAMUSCULAR | Status: DC | PRN
Start: 2023-10-10 — End: 2023-10-10
  Administered 2023-10-10: .2 mg via INTRAVENOUS

## 2023-10-10 MED ORDER — FENTANYL CITRATE (PF) 100 MCG/2ML IJ SOLN
25.0000 ug | INTRAMUSCULAR | Status: DC | PRN
  Administered 2023-10-10 (×4): 25 ug via INTRAVENOUS

## 2023-10-10 MED ORDER — PHENYLEPHRINE 80 MCG/ML (10ML) SYRINGE FOR IV PUSH (FOR BLOOD PRESSURE SUPPORT)
PREFILLED_SYRINGE | INTRAVENOUS | Status: AC
  Filled 2023-10-10: qty 10

## 2023-10-10 MED ORDER — KETOROLAC TROMETHAMINE 30 MG/ML IJ SOLN
INTRAMUSCULAR | Status: DC | PRN
  Administered 2023-10-10: 30 mg via INTRAVENOUS

## 2023-10-10 MED ORDER — CHLORHEXIDINE GLUCONATE 0.12 % MT SOLN
15.0000 mL | Freq: Once | OROMUCOSAL | Status: AC
  Administered 2023-10-10: 15 mL via OROMUCOSAL

## 2023-10-10 MED ORDER — PROPOFOL 10 MG/ML IV BOLUS
INTRAVENOUS | Status: AC
Start: 2023-10-10 — End: 2023-10-10
  Filled 2023-10-10: qty 40

## 2023-10-10 MED ORDER — GLYCOPYRROLATE 0.2 MG/ML IJ SOLN
INTRAMUSCULAR | Status: AC
  Filled 2023-10-10: qty 1

## 2023-10-10 MED ORDER — KETOROLAC TROMETHAMINE 30 MG/ML IJ SOLN
INTRAMUSCULAR | Status: AC
Start: 2023-10-10 — End: 2023-10-10
  Filled 2023-10-10: qty 1

## 2023-10-10 MED ORDER — CHLORHEXIDINE GLUCONATE CLOTH 2 % EX PADS: 6.0000 | MEDICATED_PAD | Freq: Once | CUTANEOUS | Status: DC

## 2023-10-10 MED ORDER — FENTANYL CITRATE (PF) 100 MCG/2ML IJ SOLN
INTRAMUSCULAR | Status: AC
  Filled 2023-10-10: qty 2

## 2023-10-10 MED ORDER — ORAL CARE MOUTH RINSE: 15.0000 mL | Freq: Once | OROMUCOSAL | Status: AC

## 2023-10-10 MED ORDER — LIDOCAINE HCL (PF) 2 % IJ SOLN
INTRAMUSCULAR | Status: AC
  Filled 2023-10-10: qty 5

## 2023-10-10 MED ORDER — LACTATED RINGERS IV SOLN: INTRAVENOUS | Status: DC

## 2023-10-10 MED ORDER — TECHNETIUM TC 99M TILMANOCEPT KIT
1.0000 | PACK | Freq: Once | INTRAVENOUS | Status: AC | PRN
  Administered 2023-10-10: 1.06 via INTRADERMAL

## 2023-10-10 MED ORDER — STERILE WATER FOR IRRIGATION IR SOLN
Status: DC | PRN
  Administered 2023-10-10: 500 mL

## 2023-10-10 MED ORDER — BUPIVACAINE-EPINEPHRINE (PF) 0.5% -1:200000 IJ SOLN
INTRAMUSCULAR | Status: DC | PRN
  Administered 2023-10-10 (×3): 10 mL

## 2023-10-10 MED ORDER — PROPOFOL 10 MG/ML IV BOLUS
INTRAVENOUS | Status: DC | PRN
  Administered 2023-10-10: 150 ug/kg/min via INTRAVENOUS
  Administered 2023-10-10: 150 mg via INTRAVENOUS

## 2023-10-10 MED ORDER — OXYCODONE HCL 5 MG PO TABS
ORAL_TABLET | ORAL | Status: AC
  Filled 2023-10-10: qty 1

## 2023-10-10 MED ORDER — MIDAZOLAM HCL 2 MG/2ML IJ SOLN
INTRAMUSCULAR | Status: DC | PRN
  Administered 2023-10-10: 2 mg via INTRAVENOUS

## 2023-10-10 MED ORDER — PROPOFOL 1000 MG/100ML IV EMUL
INTRAVENOUS | Status: AC
  Filled 2023-10-10: qty 100

## 2023-10-10 MED ORDER — CHLORHEXIDINE GLUCONATE 0.12 % MT SOLN
OROMUCOSAL | Status: AC
  Filled 2023-10-10: qty 15

## 2023-10-10 MED ORDER — FENTANYL CITRATE (PF) 100 MCG/2ML IJ SOLN
INTRAMUSCULAR | Status: DC | PRN
  Administered 2023-10-10: 100 ug via INTRAVENOUS

## 2023-10-10 MED ORDER — METHYLENE BLUE (ANTIDOTE) 1 % IV SOLN
INTRAVENOUS | Status: AC
  Filled 2023-10-10: qty 10

## 2023-10-10 MED ORDER — PHENYLEPHRINE 80 MCG/ML (10ML) SYRINGE FOR IV PUSH (FOR BLOOD PRESSURE SUPPORT)
PREFILLED_SYRINGE | INTRAVENOUS | Status: DC | PRN
  Administered 2023-10-10: 160 ug via INTRAVENOUS

## 2023-10-10 MED ORDER — ACETAMINOPHEN 10 MG/ML IV SOLN
INTRAVENOUS | Status: DC | PRN
  Administered 2023-10-10: 1000 mg via INTRAVENOUS

## 2023-10-10 MED ORDER — DEXAMETHASONE SODIUM PHOSPHATE 10 MG/ML IJ SOLN
INTRAMUSCULAR | Status: DC | PRN
  Administered 2023-10-10: 10 mg via INTRAVENOUS

## 2023-10-10 MED ORDER — ACETAMINOPHEN 10 MG/ML IV SOLN
INTRAVENOUS | Status: AC
  Filled 2023-10-10: qty 100

## 2023-10-10 MED ORDER — OXYCODONE HCL 5 MG PO TABS
5.0000 mg | ORAL_TABLET | Freq: Once | ORAL | Status: AC | PRN
  Administered 2023-10-10: 5 mg via ORAL

## 2023-10-10 MED ORDER — LACTATED RINGERS IV SOLN: INTRAVENOUS | Status: DC | PRN

## 2023-10-10 MED ORDER — METHYLENE BLUE 20 MG/2ML IV SOSY
PREFILLED_SYRINGE | INTRAVENOUS | Status: DC | PRN
Start: 2023-10-10 — End: 2023-10-10
  Administered 2023-10-10: 8 mL via INTRADERMAL

## 2023-10-10 MED ORDER — BUPIVACAINE-EPINEPHRINE (PF) 0.5% -1:200000 IJ SOLN
INTRAMUSCULAR | Status: AC
  Filled 2023-10-10: qty 30

## 2023-10-10 MED ORDER — BACITRACIN ZINC 500 UNIT/GM EX OINT
TOPICAL_OINTMENT | CUTANEOUS | Status: AC
  Filled 2023-10-10: qty 28.35

## 2023-10-10 MED ORDER — OXYCODONE HCL 5 MG PO TABS: 5.0000 mg | ORAL_TABLET | Freq: Four times a day (QID) | ORAL | 0 refills | Status: DC | PRN

## 2023-10-10 MED ORDER — MIDAZOLAM HCL 2 MG/2ML IJ SOLN
INTRAMUSCULAR | Status: AC
  Filled 2023-10-10: qty 2

## 2023-10-10 MED ORDER — CEFAZOLIN SODIUM-DEXTROSE 2-4 GM/100ML-% IV SOLN
INTRAVENOUS | Status: AC
  Filled 2023-10-10: qty 100

## 2023-10-10 MED ORDER — BACITRACIN ZINC 500 UNIT/GM EX OINT
TOPICAL_OINTMENT | CUTANEOUS | Status: DC | PRN
  Administered 2023-10-10: 1 via TOPICAL

## 2023-10-10 MED ORDER — LIDOCAINE HCL (CARDIAC) PF 100 MG/5ML IV SOSY
PREFILLED_SYRINGE | INTRAVENOUS | Status: DC | PRN
  Administered 2023-10-10: 100 mg via INTRAVENOUS

## 2023-10-10 SURGICAL SUPPLY — 33 items
BLADE SURG 15 STRL LF DISP TIS (BLADE) ×2 IMPLANT
CHLORAPREP W/TINT 26 (MISCELLANEOUS) IMPLANT
CNTNR URN SCR LID CUP LEK RST (MISCELLANEOUS) IMPLANT
COVER PROBE GAMMA FINDER SLV (MISCELLANEOUS) ×2 IMPLANT
DERMABOND ADVANCED .7 DNX12 (GAUZE/BANDAGES/DRESSINGS) IMPLANT
DEVICE DUBIN SPECIMEN MAMMOGRA (MISCELLANEOUS) ×2 IMPLANT
DRAPE LAPAROTOMY TRNSV 106X77 (MISCELLANEOUS) ×2 IMPLANT
DRAPE SHEET LG 3/4 BI-LAMINATE (DRAPES) IMPLANT
ELECT CAUTERY BLADE 6.4 (BLADE) IMPLANT
ELECTRODE REM PT RTRN 9FT ADLT (ELECTROSURGICAL) ×2 IMPLANT
GLOVE BIOGEL PI IND STRL 7.5 (GLOVE) ×2 IMPLANT
GLOVE SURG SYN 7.0 PF PI (GLOVE) ×2 IMPLANT
GOWN STRL REUS W/ TWL LRG LVL3 (GOWN DISPOSABLE) ×4 IMPLANT
KIT MARKER MARGIN INK (KITS) IMPLANT
KIT TURNOVER KIT A (KITS) ×2 IMPLANT
LABEL OR SOLS (LABEL) ×2 IMPLANT
MANIFOLD NEPTUNE II (INSTRUMENTS) ×2 IMPLANT
MARKER SKIN DUAL TIP RULER LAB (MISCELLANEOUS) ×2 IMPLANT
NDL HYPO 22X1.5 SAFETY MO (MISCELLANEOUS) ×2 IMPLANT
NEEDLE HYPO 22X1.5 SAFETY MO (MISCELLANEOUS) ×2 IMPLANT
PACK BASIN MINOR ARMC (MISCELLANEOUS) ×2 IMPLANT
PENCIL SMOKE EVACUATOR (MISCELLANEOUS) IMPLANT
SHEATH BREAST BIOPSY SKIN MKR (SHEATH) ×2 IMPLANT
SPONGE T-LAP 18X18 ~~LOC~~+RFID (SPONGE) IMPLANT
STRIP CLOSURE SKIN 1/2X4 (GAUZE/BANDAGES/DRESSINGS) IMPLANT
SUT VIC AB 3-0 SH 27X BRD (SUTURE) ×2 IMPLANT
SUTURE MNCRL 4-0 27XMF (SUTURE) ×2 IMPLANT
SYR 10ML LL (SYRINGE) ×2 IMPLANT
SYR BULB IRRIG 60ML STRL (SYRINGE) ×2 IMPLANT
TAPE TRANSPORE STRL 2 31045 (GAUZE/BANDAGES/DRESSINGS) IMPLANT
TRAP FLUID SMOKE EVACUATOR (MISCELLANEOUS) ×2 IMPLANT
TRAP NEPTUNE SPECIMEN COLLECT (MISCELLANEOUS) ×2 IMPLANT
WATER STERILE IRR 500ML POUR (IV SOLUTION) ×2 IMPLANT

## 2023-10-10 NOTE — H&P (Signed)
 Patient has undergone SAVI scout localization, I have personally reviewed those images. She will undergo nuclear medicine injection prior to operating room. Plan for RIGHT breast lumpectomy with Sentinel lymph node biopsy.   CC: Right Breast Cancer History of Present Illness Janet Zuniga is a 68 y.o. female with .  Past medical history significant for rheumatoid arthritis, anxiety and history of left breast cancer who presents in consultation for right invasive mammary carcinoma.  The patient reports that she was having left breast pain for several months so she went for a diagnostic mammogram.  On her diagnostic mammogram they found an area of concern within her right breast.  She did not have any areas of concern in her left breast.  She then underwent biopsy of right breast lesion that returned ER/PR positive invasive mammary carcinoma.  She presents in consultation today for treatment options.  The patient reports that she did not notice any lumps in her right breast.  She denies any overlying skin changes.  She denies any nipple retraction or nipple discharge on her right breast.  She does report that she has had left nipple retraction since her surgery for her left breast cancer.     Unfortunately I am unable to secure the records from her left breast mastectomy.  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  She then had a lumpectomy with sentinel lymph node biopsy followed by radiation therapy.  She did not have any endocrine therapy. Past Medical History     Past Medical History:  Diagnosis Date   Ankylosing spondylitis (HCC)     Anxiety     Arthritis     Blood in stool     Breast cancer (HCC) 2010    Left breast- chemo/radiation   Chronic low back pain     Chronic pain     Depression     Headache     Hepatitis C 2007   Hip fracture (HCC)     History of blood transfusion     Hypertension     Insomnia     OA  (osteoarthritis)     Osteoporosis     Personal history of chemotherapy     Personal history of radiation therapy     Spinal stenosis of lumbar region     Spondyloarthritis                   Past Surgical History:  Procedure Laterality Date   BREAST BIOPSY Right 1983    neg   BREAST BIOPSY Right 09/19/2023    US  RT BREAST BX W LOC DEV 1ST LESION IMG BX SPEC US  GUIDE 09/19/2023 [facility]   BREAST EXCISIONAL BIOPSY Left 2010    triple neg   BREAST LUMPECTOMY Left 2010   FEMUR FRACTURE SURGERY       FOOT SURGERY   2000 and 2003   FRACTURE SURGERY       INTRAMEDULLARY (IM) NAIL INTERTROCHANTERIC Right 01/12/2015    Procedure: INTRAMEDULLARY (IM) NAIL INTERTROCHANTRIC;  Surgeon: Kayla Pinal, MD;  Location: [facility];  Service: Orthopedics;  Laterality: Right;   KNEE SURGERY Left 2004   SHOULDER ARTHROSCOPY WITH SUBACROMIAL DECOMPRESSION, ROTATOR CUFF REPAIR AND BICEP TENDON REPAIR Right 07/12/2021    Procedure: RIGHT SHOULDER ARTHROSCOPY WITH DEBRIDEMENT, DECOMPRESSION, ROTATOR CUFF REPAIR, AND  BICEPS TENODESIS.;  Surgeon: Edie Norleen PARAS, MD;  Location: [facility];  Service: Orthopedics;  Laterality: Right;   TUBAL LIGATION  Allergies       Allergies  Allergen Reactions   Tramadol  Nausea Only   Penicillins Rash and Other (See Comments)      Has patient had a PCN reaction causing immediate rash, facial/tongue/throat swelling, SOB or lightheadedness with hypotension: Yes Has patient had a PCN reaction causing severe rash involving mucus membranes or skin necrosis: No Has patient had a PCN reaction that required hospitalization No Has patient had a PCN reaction occurring within the last 10 years: No If all of the above answers are NO, then may proceed with Cephalosporin use.                Current Outpatient Medications  Medication Sig Dispense Refill   busPIRone  (BUSPAR ) 30 MG tablet Take 1 tablet by mouth 2 (two) times daily as needed.       FLUoxetine   (PROZAC ) 20 MG capsule Take 20 mg by mouth daily.       hydrOXYzine (ATARAX) 50 MG tablet Take by mouth.       rosuvastatin (CRESTOR) 20 MG tablet            No current facility-administered medications for this visit.        Family History      Family History  Problem Relation Age of Onset   Arthritis Mother     Stroke Mother     Heart disease Mother     Hypertension Mother     Hyperlipidemia Mother     Depression Mother     Seizures Mother     Osteoporosis Mother     Hypertension Father     Seizures Sister     Stroke Sister     Heart disease Maternal Aunt     Hypertension Brother     Arthritis Brother     Depression Brother     Diabetes Brother     Hyperlipidemia Brother     COPD Neg Hx     Cancer Neg Hx              Social History Social History  Social History         Tobacco Use   Smoking status: Every Day      Current packs/day: 0.20      Average packs/day: 0.2 packs/day for 25.0 years (5.0 ttl pk-yrs)      Types: Cigarettes      Passive exposure: Past   Smokeless tobacco: Never  Vaping Use   Vaping status: Never Used  Substance Use Topics   Alcohol use: Yes      Alcohol/week: 1.0 standard drink of alcohol      Types: 1 Glasses of wine per week      Comment: occasionally a glass of wine cooler   Drug use: Not Currently      Types: Marijuana            ROS Full ROS of systems performed and is otherwise negative there than what is stated in the HPI   Physical Exam Blood pressure (!) 148/90, pulse 84, height 4' 11.5 (1.511 m), weight 167 lb (75.8 kg), SpO2 99%.   Alert and oriented x 3, normal work of breathing on room air, anxious and tearful during parts of the exam, abdomen is soft, nontender nondistended   Breast exam performed in the presence of a chaperone.  Left breast she does have some nipple retraction and there is a well-healed surgical scar.  There is no other skin changes and no discharge  from her nipple.  I do not feel any left  axillary lymphadenopathy.  On her right breast her biopsy sites are healed well.  She has no nipple retraction or nipple discharge.  There is no overlying skin changes and I do not feel any axillary masses or lesions. Data Reviewed I have reviewed her mammogram and there is a small lesion in her right breast approximately 3 cm from her nipple at the 9 o'clock position.  Her recent mammogram shows that this has been biopsied.  Her pathology results are positive for ER/PR positive invasive mammary carcinoma.   I have personally reviewed the patient's imaging and medical records.     Assessment Assessment Patient with right breast invasive mammary carcinoma.  She also has a history of left breast cancer.   Plan Plan I had a long discussion with the patient about the treatment options for breast cancer.  Given that she has no concerning axillary lymph nodes and she is ER/PR positive the for step would be surgical resection.  The surgical resection can either involve a lumpectomy with sentinel lymph node biopsy.  I discussed with her that if we go this route then it would be recommended that she undergo radiation therapy.  I also discussed the option of either single mastectomy or bilateral mastectomy both we would perform a sentinel lymph node biopsy on the right side.  I also discussed with her that if she wants to undergo mastectomy then we could refer her to plastic and reconstructive surgery for her reconstructive options.  She is a current smoker and smokes a little under a pack per day.  I talked with her that I was unsure about the comfort level of our plastic and reconstructive surgery team given that she is an active smoker.  I briefly discussed the risk, benefits alternatives of both of the procedures of the sentinel lymph node biopsy and lumpectomy versus the right mastectomy or bilateral mastectomy.  She understands in both the cases the risk of infection, bleeding, positive margins, need for  chemotherapy based on lymph node status, wound healing problems for which she is at increased risk given her smoking status.  Once we have input from our plastic and reconstructive surgery team we will determine the best course of action for her and proceed with surgery.  I also discussed with her that if she has any questions she can call our office or if she changes her mind and wants to opt for a lumpectomy with sentinel lymph node biopsy we can do the for which she will need a Savi scout placed.   A total of 70 minutes was spent reviewing the patient's chart, performing history and physical and discussing treatment options with the patient.     Jayson MALVA Endow 10/02/2023, 2:09 PM

## 2023-10-10 NOTE — Op Note (Addendum)
 Procedure Date:  10/10/2023  Pre-operative Diagnosis:  Right breast cancer  Post-operative Diagnosis: Same  Procedure:  Right simple mastectomy and sentinel lymph node biopsy  Surgeon:  Jayson Endow, MD  Anesthesia:  General endotracheal  Estimated Blood Loss:  5 ml  Specimens:  Right breast lumptectomy and right breast sentinel lymph node  Complications:  None  Indications for Procedure:  This is a 68 y.o. female who presents with right breast cancer.  The risks of bleeding, infection, injury to surrounding structures, hematoma, seroma, open wound, cosmetic deformity, and the need for further surgery were all discussed with the patient and was willing to proceed.  Prior to this procedure, the patient had undergone sentinel lymphoscintigraphy.  Description of Procedure: The patient was correctly identified in the preoperative area and brought into the operating room.  The patient was placed supine with VTE prophylaxis in place.  Appropriate time-outs were performed.  Anesthesia was induced and the patient was intubated.  Appropriate antibiotics were infused.  A visual dye was injected in the right periareolar region under aseptic conditions, massage administered for 5 minutes. The right chest and axilla were prepped and draped in usual sterile fashion.   I started with the axillary lymph node biopsy.  An incision was made at the anterior axillary hairline and taken down to the subcutaneous tissue with Bovie cautery.  The clavipectoral fascia was then opened.  Using the [employer] identified an area where there was highest count.  This was grasped with an Allis clamp.  The surrounding fibrofatty tissue was cleared off and lymph channels were ligated.  I did notice a hot and blue node.  This was dissected off of the surrounding fibrofatty tissue and excised fully.  The count was 650.  This was passed off the table specimen.  There did appear to be another node that was just adjacent to  this.  This had a count of approximately 55.  It was dissected from the surrounding tissue and passed off the table as specimen.  The background count was then approximately 20-30 in the axilla so no more nodes were harvested.  Hemostasis was then obtained in this area.  A Ray-Tec was then placed in the wound as I turned my attention to the right breast.  Using the [facility] scout localizer I identified the area where there was high signal.  A curvilinear incision was made over this and taken down through the breast tissue with Bovie cautery.  The Savi scout tag was localized and the mass was tagged with a 3-0 Vicryl.  I then developed my medial and inferior plane first.  I came around this making sure that there was good space between the [facility] scout and the surrounding breast tissue.  I then developed my lateral and superior margin.  I did go a little bit more on the superior posterior margin there is a look like the ribbon clip was posterior to the [place] scout.  Once these were developed I was able to eviscerate part of the specimen.  I ensured that the [facility] scout localizer was within our specimen and came across the posterior margin.  The lump specimen was then appropriately marked with ink.  Hemostasis in the breast cavity was obtained.  The breast tissue was loosely reapproximated with 3-0 Vicryl.  The deep dermal layer was closed with 3-0 Vicryl and the skin was closed with 4-0 Monocryl and then dressed with glue after hemostasis was ensured before closing.  I then turned my attention  to the axillary incision.  Again I ensured hemostasis in the axilla and the clavipectoral fascia was reapproximated using 3-0 Vicryl.  The deep dermal layer was closed with 3-0 Vicryl and the skin was closed with 4-0 Monocryl and dressed with glue and Steri-Strips.  Prior to termination of the procedure all sponge and instrument counts were correct x 2.  The patient tolerated the procedure well.  Significant margins were taken to  ensure negative margins obtained and included surrounding healthy breast tissue.   The patient was emerged from anesthesia and extubated and brought to the recovery room for further management.  The patient tolerated the procedure well and all counts were correct at the end of the case.  Sentinel Node Biopsy Synoptic Operative Report  Operation performed with curative intent:Yes  Tracer(s) used to identify sentinel nodes in the upfront surgery (non-neoadjuvant) setting (select all that apply):Dye and Radioactive Tracer  Tracer(s) used to identify sentinel nodes in the neoadjuvant setting (select all that apply):Dye and Radioactive Tracer  All nodes (colored or non-colored) present at the end of a dye-filled lymphatic channel were removed:Yes   All significantly radioactive nodes were removed:Yes  All palpable suspicious nodes were removed:N/A  Biopsy-proven positive nodes marked with clips prior to chemotherapy were identified and removed:N/A    Jayson MALVA Marinda CHRISTELLA.D

## 2023-10-10 NOTE — Anesthesia Preprocedure Evaluation (Signed)
 Anesthesia Evaluation  Patient identified by MRN, date of birth, ID band Patient awake    Reviewed: Allergy & Precautions, NPO status , Patient's Chart, lab work & pertinent test results  History of Anesthesia Complications Negative for: history of anesthetic complications  Airway Mallampati: II  TM Distance: >3 FB Neck ROM: Full    Dental  (+) Partial Lower, Partial Upper   Pulmonary neg pulmonary ROS, COPD,  COPD inhaler, Current SmokerPatient did not abstain from smoking.   Pulmonary exam normal breath sounds clear to auscultation       Cardiovascular hypertension, negative cardio ROS Normal cardiovascular exam Rhythm:Regular Rate:Normal  ECG 07/12/21:  Normal sinus rhythm Low voltage QRS Cannot rule out Anterior infarct , age undetermined   Neuro/Psych  Headaches PSYCHIATRIC DISORDERS Anxiety Depression    Chronic pain negative neurological ROS  negative psych ROS   GI/Hepatic negative GI ROS, Neg liver ROS,,,(+) Hepatitis -, C  Endo/Other  negative endocrine ROS    Renal/GU      Musculoskeletal   Abdominal   Peds  Hematology negative hematology ROS (+) Breast CA   Anesthesia Other Findings Past Medical History: No date: Ankylosing spondylitis (HCC) No date: Anxiety No date: Arthritis No date: Blood in stool 2010: Breast cancer (HCC)     Comment:  Left breast- chemo/radiation No date: Chronic low back pain No date: Chronic pain No date: Depression No date: Headache 2007: Hepatitis C No date: Hip fracture (HCC) No date: History of blood transfusion No date: Hypertension No date: Insomnia No date: OA (osteoarthritis) No date: Osteoporosis No date: Personal history of chemotherapy No date: Personal history of radiation therapy No date: Pneumonia No date: Spinal stenosis of lumbar region No date: Spondyloarthritis  Past Surgical History: 1983: BREAST BIOPSY; Right     Comment:  neg 09/19/2023:  BREAST BIOPSY; Right     Comment:  US  RT BREAST BX W LOC DEV 1ST LESION IMG BX SPEC US                GUIDE 09/19/2023 [facility] 10/04/2023: BREAST BIOPSY; Right     Comment:  MM RT BREAST SAVI/RF TAG 1ST LESION MAMMO GUIDE               10/04/2023 [facility] 2010: BREAST EXCISIONAL BIOPSY; Left     Comment:  triple neg 2010: BREAST LUMPECTOMY; Left No date: FEMUR FRACTURE SURGERY 2000 and 2003: FOOT SURGERY No date: FRACTURE SURGERY 01/12/2015: INTRAMEDULLARY (IM) NAIL INTERTROCHANTERIC; Right     Comment:  Procedure: INTRAMEDULLARY (IM) NAIL INTERTROCHANTRIC;                Surgeon: Kayla Pinal, MD;  Location: [facility] ORS;                Service: Orthopedics;  Laterality: Right; 2004: KNEE SURGERY; Left 07/12/2021: SHOULDER ARTHROSCOPY WITH SUBACROMIAL DECOMPRESSION,  ROTATOR CUFF REPAIR AND BICEP TENDON REPAIR; Right     Comment:  Procedure: RIGHT SHOULDER ARTHROSCOPY WITH DEBRIDEMENT,               DECOMPRESSION, ROTATOR CUFF REPAIR, AND  BICEPS               TENODESIS.;  Surgeon: Edie Norleen PARAS, MD;  Location: [facility]               ORS;  Service: Orthopedics;  Laterality: Right; No date: TUBAL LIGATION     Reproductive/Obstetrics negative OB ROS  Anesthesia Physical Anesthesia Plan  ASA: 3  Anesthesia Plan: General   Post-op Pain Management:    Induction: Intravenous  PONV Risk Score and Plan: 3 and Ondansetron , Dexamethasone , Propofol  infusion, TIVA and Midazolam   Airway Management Planned: LMA  Additional Equipment:   Intra-op Plan:   Post-operative Plan: Extubation in OR  Informed Consent: I have reviewed the patients History and Physical, chart, labs and discussed the procedure including the risks, benefits and alternatives for the proposed anesthesia with the patient or authorized representative who has indicated his/her understanding and acceptance.     Dental Advisory Given  Plan Discussed  with: Anesthesiologist, CRNA and Surgeon  Anesthesia Plan Comments: (Patient consented for risks of anesthesia including but not limited to:  - adverse reactions to medications - damage to eyes, teeth, lips or other oral mucosa - nerve damage due to positioning  - sore throat or hoarseness - Damage to heart, brain, nerves, lungs, other parts of body or loss of life  Patient voiced understanding and assent.)        Anesthesia Quick Evaluation

## 2023-10-10 NOTE — Anesthesia Procedure Notes (Signed)
 Procedure Name: LMA Insertion Date/Time: 10/10/2023 11:13 AM  Performed by: Kyllian Clingerman R, CRNAPre-anesthesia Checklist: Patient identified, Emergency Drugs available, Suction available, Patient being monitored and Timeout performed Patient Re-evaluated:Patient Re-evaluated prior to induction Oxygen Delivery Method: Circle system utilized Preoxygenation: Pre-oxygenation with 100% oxygen Induction Type: IV induction LMA: LMA inserted LMA Size: 3.0 Number of attempts: 1 Placement Confirmation: positive ETCO2 and breath sounds checked- equal and bilateral Tube secured with: Tape Dental Injury: Teeth and Oropharynx as per pre-operative assessment

## 2023-10-10 NOTE — OR Nursing (Signed)
 OK to place peripheral IV in left lower arm per Dr. Marinda.

## 2023-10-10 NOTE — Brief Op Note (Signed)
 10/10/2023  2:37 PM  PATIENT:  Janet Zuniga  68 y.o. 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  PROCEDURE:  Procedure(s): BREAST LUMPECTOMY WITH RADIO FREQUENCY LOCALIZER (Right) BIOPSY, LYMPH NODE, SENTINEL, AXILLARY (Right)  SURGEON:  Surgeons and Role:    * Marinda Jayson KIDD, MD - Primary  PHYSICIAN ASSISTANT:   ASSISTANTS: none   ANESTHESIA:   general  EBL:  5 mL   BLOOD ADMINISTERED:none  DRAINS: none   LOCAL MEDICATIONS USED:  MARCAINE      SPECIMEN:  Source of Specimen:  right breast sentinel lymph node biopsy, right breast lumpectomy  DISPOSITION OF SPECIMEN:  PATHOLOGY  COUNTS:  YES  TOURNIQUET:  * No tourniquets in log *  DICTATION: .Dragon Dictation  PLAN OF CARE: Discharge to home after PACU  PATIENT DISPOSITION:  PACU - hemodynamically stable.   Delay start of Pharmacological VTE agent (>24hrs) due to surgical blood loss or risk of bleeding: no

## 2023-10-10 NOTE — Transfer of Care (Signed)
 Immediate Anesthesia Transfer of Care Note  Patient: Janet Zuniga  Procedure(s) Performed: BREAST LUMPECTOMY WITH RADIO FREQUENCY LOCALIZER (Right: Breast) BIOPSY, LYMPH NODE, SENTINEL, AXILLARY (Right: Axilla)  Patient Location: PACU  Anesthesia Type:General  Level of Consciousness: awake  Airway & Oxygen Therapy: Patient Spontanous Breathing  Post-op Assessment: Report given to RN and Post -op Vital signs reviewed and stable  Post vital signs: Reviewed and stable  Last Vitals:  Vitals Value Taken Time  BP 126/72 10/10/23 14:03  Temp    Pulse 85 10/10/23 14:10  Resp 18 10/10/23 14:10  SpO2 94 % 10/10/23 14:10  Vitals shown include unfiled device data.  Last Pain: There were no vitals filed for this visit.       Complications: There were no known notable events for this encounter.

## 2023-10-11 ENCOUNTER — Encounter: Payer: Self-pay | Admitting: General Surgery

## 2023-10-12 LAB — SURGICAL PATHOLOGY

## 2023-10-15 ENCOUNTER — Telehealth: Payer: Self-pay | Admitting: General Surgery

## 2023-10-15 ENCOUNTER — Encounter: Payer: Self-pay | Admitting: *Deleted

## 2023-10-15 NOTE — Telephone Encounter (Signed)
 Pt is concerned over hte my chart pathology report that she seen and is asking for a call back about the results. Pt # is [phone]

## 2023-10-15 NOTE — Progress Notes (Signed)
 Oncotype Dx order [id number] submitted online

## 2023-10-16 NOTE — Anesthesia Postprocedure Evaluation (Signed)
 Anesthesia Post Note  Patient: Janet Zuniga  Procedure(s) Performed: BREAST LUMPECTOMY WITH RADIO FREQUENCY LOCALIZER (Right: Breast) BIOPSY, LYMPH NODE, SENTINEL, AXILLARY (Right: Axilla)  Patient location during evaluation: PACU Anesthesia Type: General Level of consciousness: awake Vital Signs Assessment: post-procedure vital signs reviewed and stable Respiratory status: nonlabored ventilation Cardiovascular status: blood pressure returned to baseline Anesthetic complications: no   There were no known notable events for this encounter.   Last Vitals:  Vitals:   10/10/23 1445 10/10/23 1502  BP: (!) 154/81 (!) 154/91  Pulse: 82 83  Resp: 17 16  Temp: (!) 36.1 C (!) 36.2 C  SpO2: 96% 97%    Last Pain:  Vitals:   10/10/23 1502  TempSrc: Temporal  PainSc: 4                  VAN STAVEREN,Kenneith Stief

## 2023-10-17 ENCOUNTER — Inpatient Hospital Stay (HOSPITAL_BASED_OUTPATIENT_CLINIC_OR_DEPARTMENT_OTHER): Admitting: Hospice and Palliative Medicine

## 2023-10-17 DIAGNOSIS — C50911 Malignant neoplasm of unspecified site of right female breast: Secondary | ICD-10-CM

## 2023-10-17 NOTE — Progress Notes (Signed)
 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 The MOC considered available treatment options and made the following recommendations/referrals:  Rehab screening, SW, genetics  The MOC is a meeting of clinicians from various specialty areas who evaluate and discuss patients for whom a multidisciplinary approach is being considered. Final determinations in the plan of care are those of the provider(s).   Today's extended care, comprehensive team conference, Janet Zuniga was not present for the discussion and was not examined.

## 2023-10-24 ENCOUNTER — Encounter: Payer: Self-pay | Admitting: Oncology

## 2023-10-25 ENCOUNTER — Encounter: Payer: Self-pay | Admitting: General Surgery

## 2023-10-25 ENCOUNTER — Ambulatory Visit (INDEPENDENT_AMBULATORY_CARE_PROVIDER_SITE_OTHER): Admitting: General Surgery

## 2023-10-25 VITALS — BP 158/87 | HR 81 | Ht 59.5 in | Wt 168.0 lb

## 2023-10-25 DIAGNOSIS — C50811 Malignant neoplasm of overlapping sites of right female breast: Secondary | ICD-10-CM

## 2023-10-25 DIAGNOSIS — C50911 Malignant neoplasm of unspecified site of right female breast: Secondary | ICD-10-CM

## 2023-10-25 DIAGNOSIS — Z08 Encounter for follow-up examination after completed treatment for malignant neoplasm: Secondary | ICD-10-CM

## 2023-10-25 NOTE — Progress Notes (Signed)
 Patient returns status post right lumpectomy with sentinel lymph node biopsy.  She reports doing well.  She says that she continues to have some pain in her axilla.  This is controlled with pain medications.  She denies any fevers or chills.  She denies any drainage from her incisions.  On exam her right breast still has blue dye on the skin.  I did discuss with her that this will take a while for it to fully resorb.  Her incision is healing well with little bit of scabbing over it.  I do not see any erythema surrounding the incision.  Her axillary incision is healing well without any erythema.  There are some induration tissue beneath both incisions.  I discussed with her again her pathology consistent with negative margins of her tumor and a negative sentinel lymph node biopsy.  I placed Steri-Strips over the incision.  Continue local wound care.  She has an appointment with oncology to discuss hormonal therapy as well as radiation oncology to discuss radiation therapy to the right breast.  We will see her again in 6 months.

## 2023-10-25 NOTE — Patient Instructions (Addendum)
 The glue will start to come off on its own in 1-2 weeks.   Follow up with oncology as scheduled.     Follow-up with our office in 6 months. We will send you a letter about this appointment.   Please call and ask to speak with a nurse if you develop questions or concerns.

## 2023-10-31 ENCOUNTER — Ambulatory Visit
Admission: RE | Admit: 2023-10-31 | Discharge: 2023-10-31 | Disposition: A | Discharge status: Home / Self Care | Source: Ambulatory Visit | Attending: Radiation Oncology | Admitting: Radiation Oncology

## 2023-10-31 ENCOUNTER — Inpatient Hospital Stay: Admitting: Occupational Therapy

## 2023-10-31 ENCOUNTER — Inpatient Hospital Stay (HOSPITAL_BASED_OUTPATIENT_CLINIC_OR_DEPARTMENT_OTHER): Admitting: Oncology

## 2023-10-31 ENCOUNTER — Encounter: Payer: Self-pay | Admitting: Occupational Therapy

## 2023-10-31 ENCOUNTER — Encounter: Payer: Self-pay | Admitting: Oncology

## 2023-10-31 VITALS — BP 128/80 | HR 71 | Temp 97.7°F | Resp 16 | Wt 167.0 lb

## 2023-10-31 DIAGNOSIS — C50911 Malignant neoplasm of unspecified site of right female breast: Secondary | ICD-10-CM | POA: Diagnosis not present

## 2023-10-31 DIAGNOSIS — M459 Ankylosing spondylitis of unspecified sites in spine: Secondary | ICD-10-CM | POA: Diagnosis not present

## 2023-10-31 DIAGNOSIS — Z8701 Personal history of pneumonia (recurrent): Secondary | ICD-10-CM | POA: Diagnosis not present

## 2023-10-31 DIAGNOSIS — M25611 Stiffness of right shoulder, not elsewhere classified: Secondary | ICD-10-CM

## 2023-10-31 DIAGNOSIS — Z923 Personal history of irradiation: Secondary | ICD-10-CM | POA: Diagnosis not present

## 2023-10-31 DIAGNOSIS — Z170 Estrogen receptor positive status [ER+]: Secondary | ICD-10-CM | POA: Diagnosis not present

## 2023-10-31 DIAGNOSIS — Z880 Allergy status to penicillin: Secondary | ICD-10-CM | POA: Diagnosis not present

## 2023-10-31 DIAGNOSIS — G8929 Other chronic pain: Secondary | ICD-10-CM | POA: Diagnosis not present

## 2023-10-31 DIAGNOSIS — Z1721 Progesterone receptor positive status: Secondary | ICD-10-CM | POA: Diagnosis not present

## 2023-10-31 DIAGNOSIS — Z9851 Tubal ligation status: Secondary | ICD-10-CM | POA: Diagnosis not present

## 2023-10-31 DIAGNOSIS — Z9221 Personal history of antineoplastic chemotherapy: Secondary | ICD-10-CM | POA: Diagnosis not present

## 2023-10-31 DIAGNOSIS — M549 Dorsalgia, unspecified: Secondary | ICD-10-CM | POA: Diagnosis not present

## 2023-10-31 DIAGNOSIS — M810 Age-related osteoporosis without current pathological fracture: Secondary | ICD-10-CM | POA: Diagnosis not present

## 2023-10-31 DIAGNOSIS — Z8249 Family history of ischemic heart disease and other diseases of the circulatory system: Secondary | ICD-10-CM | POA: Diagnosis not present

## 2023-10-31 DIAGNOSIS — Z823 Family history of stroke: Secondary | ICD-10-CM | POA: Diagnosis not present

## 2023-10-31 DIAGNOSIS — Z79899 Other long term (current) drug therapy: Secondary | ICD-10-CM | POA: Diagnosis not present

## 2023-10-31 DIAGNOSIS — Z8619 Personal history of other infectious and parasitic diseases: Secondary | ICD-10-CM | POA: Diagnosis not present

## 2023-10-31 DIAGNOSIS — Z8261 Family history of arthritis: Secondary | ICD-10-CM | POA: Diagnosis not present

## 2023-10-31 DIAGNOSIS — I10 Essential (primary) hypertension: Secondary | ICD-10-CM | POA: Diagnosis not present

## 2023-10-31 DIAGNOSIS — F17210 Nicotine dependence, cigarettes, uncomplicated: Secondary | ICD-10-CM | POA: Diagnosis not present

## 2023-10-31 DIAGNOSIS — Z1732 Human epidermal growth factor receptor 2 negative status: Secondary | ICD-10-CM | POA: Diagnosis not present

## 2023-10-31 DIAGNOSIS — M1610 Unilateral primary osteoarthritis, unspecified hip: Secondary | ICD-10-CM | POA: Insufficient documentation

## 2023-10-31 DIAGNOSIS — Z885 Allergy status to narcotic agent status: Secondary | ICD-10-CM | POA: Diagnosis not present

## 2023-10-31 DIAGNOSIS — C50411 Malignant neoplasm of upper-outer quadrant of right female breast: Secondary | ICD-10-CM | POA: Diagnosis present

## 2023-10-31 DIAGNOSIS — Z83438 Family history of other disorder of lipoprotein metabolism and other lipidemia: Secondary | ICD-10-CM | POA: Diagnosis not present

## 2023-10-31 DIAGNOSIS — F419 Anxiety disorder, unspecified: Secondary | ICD-10-CM | POA: Diagnosis not present

## 2023-10-31 NOTE — Progress Notes (Unsigned)
 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 Oncotype Dx score 10, low risk.  INTERVAL HISTORY: Patient returns to clinic today for further evaluation and treatment planning.  She underwent lumpectomy on October 10, 2023.  She continues to have chronic back pain from ankylosing spondylitis, but otherwise feels well.  She has no neurologic complaints.  She denies any recent fevers or illnesses.  She has a good appetite and denies weight loss.  She has no chest pain, shortness of breath, cough, or hemoptysis.  She denies any nausea, vomiting, constipation, or diarrhea.  She has no urinary complaints.  Patient offers no further specific complaints today.  REVIEW OF SYSTEMS:   Review of Systems  Constitutional: Negative.  Negative for fever, malaise/fatigue and weight loss.  Respiratory: Negative.  Negative for cough, hemoptysis and shortness of breath.   Cardiovascular: Negative.  Negative for chest pain and leg swelling.  Gastrointestinal: Negative.  Negative for abdominal pain.  Genitourinary: Negative.  Negative for dysuria.  Musculoskeletal:  Positive for back pain.  Skin: Negative.  Negative for rash.  Neurological: Negative.  Negative for dizziness, focal weakness, weakness and headaches.  Psychiatric/Behavioral: Negative.  The patient is not nervous/anxious.     As per HPI. Otherwise, a complete review of systems is negative.  PAST MEDICAL HISTORY: Past Medical History:  Diagnosis Date   Ankylosing  spondylitis (HCC)    Anxiety    Arthritis    Blood in stool    Breast cancer (HCC) 2010   Left breast- chemo/radiation   Chronic low back pain    Chronic pain    Depression    Headache    Hepatitis C 2007   Hip fracture (HCC)    History of blood transfusion    Hypertension    Insomnia    OA (osteoarthritis)    Osteoporosis    Personal history of chemotherapy    Personal history of radiation therapy    Pneumonia    Spinal stenosis of lumbar region    Spondyloarthritis     PAST SURGICAL HISTORY: Past Surgical History:  Procedure Laterality Date   AXILLARY SENTINEL NODE BIOPSY Right 10/10/2023   Procedure: BIOPSY, LYMPH NODE, SENTINEL, AXILLARY;  Surgeon: Marinda Jayson KIDD, MD;  Location: [facility];  Service: General;  Laterality: Right;   BREAST BIOPSY Right 1983   neg   BREAST BIOPSY Right 09/19/2023   US  RT BREAST BX W LOC DEV 1ST LESION IMG BX SPEC US  GUIDE 09/19/2023 [facility]   BREAST BIOPSY Right 10/04/2023   MM RT BREAST SAVI/RF TAG 1ST LESION MAMMO GUIDE 10/04/2023 [facility]   BREAST EXCISIONAL BIOPSY Left 2010   triple neg   BREAST LUMPECTOMY Left 2010   BREAST LUMPECTOMY WITH RADIO FREQUENCY LOCALIZER Right 10/10/2023   Procedure: BREAST LUMPECTOMY WITH RADIO FREQUENCY LOCALIZER;  Surgeon: Marinda Jayson KIDD, MD;  Location: [facility];  Service: General;  Laterality: Right;   FEMUR FRACTURE SURGERY     FOOT SURGERY  2000 and 2003   FRACTURE SURGERY     INTRAMEDULLARY (IM) NAIL INTERTROCHANTERIC Right 01/12/2015  Procedure: INTRAMEDULLARY (IM) NAIL INTERTROCHANTRIC;  Surgeon: Kayla Pinal, MD;  Location: [facility];  Service: Orthopedics;  Laterality: Right;   KNEE SURGERY Left 2004   SHOULDER ARTHROSCOPY WITH SUBACROMIAL DECOMPRESSION, ROTATOR CUFF REPAIR AND BICEP TENDON REPAIR Right 07/12/2021   Procedure: RIGHT SHOULDER ARTHROSCOPY WITH DEBRIDEMENT, DECOMPRESSION, ROTATOR CUFF REPAIR, AND  BICEPS TENODESIS.;  Surgeon: Edie Norleen PARAS, MD;  Location: [facility];   Service: Orthopedics;  Laterality: Right;   TUBAL LIGATION      FAMILY HISTORY: Family History  Problem Relation Age of Onset   Arthritis Mother    Stroke Mother    Heart disease Mother    Hypertension Mother    Hyperlipidemia Mother    Depression Mother    Seizures Mother    Osteoporosis Mother    Hypertension Father    Seizures Sister    Stroke Sister    Heart disease Maternal Aunt    Hypertension Brother    Arthritis Brother    Depression Brother    Diabetes Brother    Hyperlipidemia Brother    COPD Neg Hx    Cancer Neg Hx     ADVANCED DIRECTIVES (Y/N):  N  HEALTH MAINTENANCE: Social History   Tobacco Use   Smoking status: Every Day    Current packs/day: 0.20    Average packs/day: 0.2 packs/day for 25.0 years (5.0 ttl pk-yrs)    Types: Cigarettes    Passive exposure: Past   Smokeless tobacco: Never  Vaping Use   Vaping status: Never Used  Substance Use Topics   Alcohol use: Yes    Alcohol/week: 1.0 standard drink of alcohol    Types: 1 Glasses of wine per week    Comment: occasionally a glass of wine cooler   Drug use: Not Currently    Types: Marijuana     Colonoscopy:  PAP:  Bone density:  Lipid panel:  Allergies  Allergen Reactions   Tramadol  Nausea Only   Penicillins Rash and Other (See Comments)    Has patient had a PCN reaction causing immediate rash, facial/tongue/throat swelling, SOB or lightheadedness with hypotension: Yes Has patient had a PCN reaction causing severe rash involving mucus membranes or skin necrosis: No Has patient had a PCN reaction that required hospitalization No Has patient had a PCN reaction occurring within the last 10 years: No If all of the above answers are NO, then may proceed with Cephalosporin use.     Current Outpatient Medications  Medication Sig Dispense Refill   busPIRone  (BUSPAR ) 30 MG tablet Take 30 mg by mouth in the morning and at bedtime.     Calcium Carbonate-Vit D-Min (CALCIUM 1200 PO) Take  1,200 mg by mouth daily.     Cholecalciferol (VITAMIN D) 50 MCG (2000 UT) tablet Take 4,000 Units by mouth daily.     FLUoxetine  (PROZAC ) 20 MG capsule Take 20 mg by mouth daily.     rosuvastatin (CRESTOR) 20 MG tablet Take 20 mg by mouth every 14 (fourteen) days.     tiZANidine (ZANAFLEX) 4 MG tablet Take 4 mg by mouth every 6 (six) hours as needed for muscle spasms.     No current facility-administered medications for this visit.    OBJECTIVE: Vitals:   10/31/23 1021  BP: 128/80  Pulse: 71  Resp: 16  Temp: 97.7 F (36.5 C)  SpO2: 100%     Body mass index is 33.17 kg/m.    ECOG FS:0 - Asymptomatic  General: Well-developed, well-nourished, no acute distress. Eyes: Pink conjunctiva,  anicteric sclera. HEENT: Normocephalic, moist mucous membranes. Lungs: No audible wheezing or coughing. Heart: Regular rate and rhythm. Abdomen: Soft, nontender, no obvious distention. Musculoskeletal: No edema, cyanosis, or clubbing. Neuro: Alert, answering all questions appropriately. Cranial nerves grossly intact. Skin: No rashes or petechiae noted. Psych: Normal affect.  LAB RESULTS:  Lab Results  Component Value Date   NA 138 10/10/2023   K 3.6 10/10/2023   CL 107 10/10/2023   CO2 20 (L) 10/10/2023   GLUCOSE 120 (H) 10/10/2023   BUN 24 (H) 10/10/2023   CREATININE 0.66 10/10/2023   CALCIUM 8.7 (L) 10/10/2023   PROT 7.5 06/14/2023   ALBUMIN 4.0 06/14/2023   AST 29 06/14/2023   ALT 30 06/14/2023   ALKPHOS 57 06/14/2023   BILITOT 0.5 06/14/2023   GFRNONAA >60 10/10/2023   GFRAA 58 (L) 08/29/2019    Lab Results  Component Value Date   WBC 9.0 10/10/2023   NEUTROABS 6.8 03/20/2023   HGB 12.6 10/10/2023   HCT 36.8 10/10/2023   MCV 92.7 10/10/2023   PLT 188 10/10/2023     STUDIES: MM Breast Surgical Specimen Result Date: 10/10/2023 CLINICAL DATA:  Status post Savi Scout localized RIGHT breast lumpectomy. Patient underwent ultrasound-guided biopsy of a RIGHT breast mass which  demonstrated invasive ductal carcinoma (RIBBON clip). EXAM: SPECIMEN RADIOGRAPH OF THE RIGHT BREAST COMPARISON:  Previous exam(s). FINDINGS: Status post excision of the RIGHT breast. The [facility] reflector and RIBBON shaped clip are present within the specimen. IMPRESSION: Specimen radiograph of the RIGHT breast. These results were called by telephone at the time of interpretation on 10/10/2023 at 1:24 pm to provider [facility] , who verbally acknowledged these results. Electronically Signed   By: Corean Salter M.D.   On: 10/10/2023 13:25   NM Sentinel Node Inj-No Rpt (Breast) Result Date: 10/10/2023 Lymphoseek was injected by the Nuclear Medicine Technologist for sentinel lymph node localization.   MM RT BREAST SAVI/RF TAG 1ST LESION MAMMO GUIDE Result Date: 10/04/2023 CLINICAL DATA:  68 year old female presents for SAVI SCOUT localization of RIGHT breast cancer. EXAM: NEEDLE LOCALIZATION OF THE RIGHT BREAST WITH MAMMO GUIDANCE COMPARISON:  Previous exam(s). FINDINGS: Patient presents for needle localization prior to RIGHT lumpectomy. I met with the patient and we discussed the procedure of needle localization including benefits and alternatives. We discussed the high likelihood of a successful procedure. We discussed the risks of the procedure, including infection, bleeding, tissue injury, and further surgery. Informed, written consent was given. The usual time-out protocol was performed immediately prior to the procedure. Using mammographic guidance, sterile technique, 1% lidocaine  and a SAVI SCOUT needle, the RIBBON biopsy clip was localized using a SUPERIOR approach. Reflector function was confirmed with an auditory signal from the SAVI SCOUT guide. IMPRESSION: Radar reflector localization of the RIGHT breast. No apparent complications. Electronically Signed   By: Reyes Phi M.D.   On: 10/04/2023 13:26    ASSESSMENT: Pathologic stage Ia ER/PR positive, HER2 negative invasive carcinoma of the  right breast.  Oncotype Dx score 10, low risk.  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  She underwent lumpectomy on October 10, 2023 confirming stage of disease.  Oncotype DX score was low risk, therefore she does not require adjuvant chemotherapy.  She has an appointment with radiation oncology today to discuss adjuvant XRT.  At the conclusion of  her XRT will place patient on letrozole for a total of 5 years.  Return to clinic at the end of XRT for further evaluation.   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  She did not require an aromatase inhibitor given the ER/PR negative nature of her malignancy. Anxiety: Chronic and unchanged.  Patient should not receive Xanax  from this clinic. Pain: Secondary to ankylosing spondylitis.  Patient is not to receive any pain medication from this clinic.  Follow-up with [facility]ology at [facility] as scheduled.  Okay from an oncology standpoint to continue using Enbrel as needed.  I spent a total of 30 minutes reviewing chart data, face-to-face evaluation with the patient, counseling and coordination of care as detailed above.    Patient expressed understanding and was in agreement with this plan. She also understands that She can call clinic at any time with any questions, concerns, or complaints.    Cancer Staging  Invasive ductal carcinoma of right breast [facility]) Staging form: Breast, AJCC 8th Edition - Clinical stage from 09/27/2023: Stage IA (cT1b, cN0, cM0, G2, ER+, PR+, HER2-) - Signed by Jacobo Evalene PARAS, MD on 09/27/2023 Histologic grading system: 3 grade system   Evalene PARAS Jacobo, MD   11/01/2023 1:29 PM

## 2023-10-31 NOTE — Consult Note (Signed)
 Wrong note ..................................................................................................SABRA

## 2023-10-31 NOTE — Consult Note (Signed)
 NEW PATIENT EVALUATION  Name: Janet Zuniga  MRN: [id number]  Date:   10/31/2023     DOB: February 11, 1955   This 68 y.o. female patient presents to the clinic for initial evaluation of Pathologic stage Ia (pT1b pN0 (SN) M0) ER/PR positive HER2 negative invasive lobular carcinoma of the right breast and patient previously treated to her left breast remotely  REFERRING PHYSICIAN: Jacobo Evalene PARAS, MD  CHIEF COMPLAINT:  Chief Complaint  Patient presents with   Breast Cancer    DIAGNOSIS: The encounter diagnosis was Invasive ductal carcinoma of right breast (HCC).   PREVIOUS INVESTIGATIONS:  Mammogram and ultrasound reviewed Clinical notes reviewed Pathology report reviewed  HPI: Patient is a 68 year old female now out 15 years having completed adjuvant radiation therapy to her left breast.  She recently presented with an abnormal mammogram of her right breast showing an indeterminant area of architectural distortion approximately 6 mm in the right outer breast.  There was no suspicious right axillary adenopathy.  Ultrasound-guided biopsy was performed which was positive for invasive mammary carcinoma.  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  Margins were negative for carcinoma.  Tumor was ER/PR positive HER2/neu not overexpressed.  Oncotype DX was 10 and she will not receive systemic therapy.  She is seen today for radiation oncology opinion.  She is doing fairly well her right breast is still somewhat swollen most likely seroma present.  Incision is well-healed no dominant masses noted in either breast no axillary or supraclavicular adenopathy is appreciated.  PLANNED TREATMENT REGIMEN: Hypofractionated right whole breast radiation  PAST MEDICAL HISTORY:  has a past medical history of Ankylosing spondylitis (HCC), Anxiety, Arthritis, Blood in stool, Breast cancer (HCC) (2010), Chronic low back pain,  Chronic pain, Depression, Headache, Hepatitis C (2007), Hip fracture (HCC), History of blood transfusion, Hypertension, Insomnia, OA (osteoarthritis), Osteoporosis, Personal history of chemotherapy, Personal history of radiation therapy, Pneumonia, Spinal stenosis of lumbar region, and Spondyloarthritis.    PAST SURGICAL HISTORY:  Past Surgical History:  Procedure Laterality Date   AXILLARY SENTINEL NODE BIOPSY Right 10/10/2023   Procedure: BIOPSY, LYMPH NODE, SENTINEL, AXILLARY;  Surgeon: Marinda Jayson KIDD, MD;  Location: [facility];  Service: General;  Laterality: Right;   BREAST BIOPSY Right 1983   neg   BREAST BIOPSY Right 09/19/2023   US  RT BREAST BX W LOC DEV 1ST LESION IMG BX SPEC US  GUIDE 09/19/2023 [facility]   BREAST BIOPSY Right 10/04/2023   MM RT BREAST SAVI/RF TAG 1ST LESION MAMMO GUIDE 10/04/2023 [facility]   BREAST EXCISIONAL BIOPSY Left 2010   triple neg   BREAST LUMPECTOMY Left 2010   BREAST LUMPECTOMY WITH RADIO FREQUENCY LOCALIZER Right 10/10/2023   Procedure: BREAST LUMPECTOMY WITH RADIO FREQUENCY LOCALIZER;  Surgeon: Marinda Jayson KIDD, MD;  Location: [facility];  Service: General;  Laterality: Right;   FEMUR FRACTURE SURGERY     FOOT SURGERY  2000 and 2003   FRACTURE SURGERY     INTRAMEDULLARY (IM) NAIL INTERTROCHANTERIC Right 01/12/2015   Procedure: INTRAMEDULLARY (IM) NAIL INTERTROCHANTRIC;  Surgeon: Kayla Pinal, MD;  Location: [facility];  Service: Orthopedics;  Laterality: Right;   KNEE SURGERY Left 2004   SHOULDER ARTHROSCOPY WITH SUBACROMIAL DECOMPRESSION, ROTATOR CUFF REPAIR AND BICEP TENDON REPAIR Right 07/12/2021   Procedure: RIGHT SHOULDER ARTHROSCOPY WITH DEBRIDEMENT, DECOMPRESSION, ROTATOR CUFF REPAIR, AND  BICEPS TENODESIS.;  Surgeon: Edie Norleen PARAS, MD;  Location: [facility];  Service:  Orthopedics;  Laterality: Right;   TUBAL LIGATION      FAMILY HISTORY: family history includes Arthritis in her brother and mother; Depression in her brother and mother;  Diabetes in her brother; Heart disease in her maternal aunt and mother; Hyperlipidemia in her brother and mother; Hypertension in her brother, father, and mother; Osteoporosis in her mother; Seizures in her mother and sister; Stroke in her mother and sister.  SOCIAL HISTORY:  reports that she has been smoking cigarettes. She has a 5 pack-year smoking history. She has been exposed to tobacco smoke. She has never used smokeless tobacco. She reports current alcohol use of about 1.0 standard drink of alcohol per week. She reports that she does not currently use drugs after having used the following drugs: Marijuana.  ALLERGIES: Tramadol  and Penicillins  MEDICATIONS:  Current Outpatient Medications  Medication Sig Dispense Refill   busPIRone  (BUSPAR ) 30 MG tablet Take 30 mg by mouth in the morning and at bedtime.     Calcium Carbonate-Vit D-Min (CALCIUM 1200 PO) Take 1,200 mg by mouth daily.     Cholecalciferol (VITAMIN D) 50 MCG (2000 UT) tablet Take 4,000 Units by mouth daily.     FLUoxetine  (PROZAC ) 20 MG capsule Take 20 mg by mouth daily.     rosuvastatin (CRESTOR) 20 MG tablet Take 20 mg by mouth every 14 (fourteen) days.     tiZANidine (ZANAFLEX) 4 MG tablet Take 4 mg by mouth every 6 (six) hours as needed for muscle spasms.     No current facility-administered medications for this encounter.    ECOG PERFORMANCE STATUS:  1 - Symptomatic but completely ambulatory  REVIEW OF SYSTEMS: Patient denies any weight loss, fatigue, weakness, fever, chills or night sweats. Patient denies any loss of vision, blurred vision. Patient denies any ringing  of the ears or hearing loss. No irregular heartbeat. Patient denies heart murmur or history of fainting. Patient denies any chest pain or pain radiating to her upper extremities. Patient denies any shortness of breath, difficulty breathing at night, cough or hemoptysis. Patient denies any swelling in the lower legs. Patient denies any nausea vomiting,  vomiting of blood, or coffee ground material in the vomitus. Patient denies any stomach pain. Patient states has had normal bowel movements no significant constipation or diarrhea. Patient denies any dysuria, hematuria or significant nocturia. Patient denies any problems walking, swelling in the joints or loss of balance. Patient denies any skin changes, loss of hair or loss of weight. Patient denies any excessive worrying or anxiety or significant depression. Patient denies any problems with insomnia. Patient denies excessive thirst, polyuria, polydipsia. Patient denies any swollen glands, patient denies easy bruising or easy bleeding. Patient denies any recent infections, allergies or URI. Patient s visual fields have not changed significantly in recent time.   PHYSICAL EXAM: There were no vitals taken for this visit. Left Resta somewhat shrunk in size no dominant masses noted in either breast.  Right breast is swollen most likely secondary to seroma.  Incision is well-healed.  No axillary or supraclavicular adenopathy is appreciated.  Well-developed well-nourished patient in NAD. HEENT reveals PERLA, EOMI, discs not visualized.  Oral cavity is clear. No oral mucosal lesions are identified. Neck is clear without evidence of cervical or supraclavicular adenopathy. Lungs are clear to A&P. Cardiac examination is essentially unremarkable with regular rate and rhythm without murmur rub or thrill. Abdomen is benign with no organomegaly or masses noted. Motor sensory and DTR levels are equal and symmetric in the  upper and lower extremities. Cranial nerves II through XII are grossly intact. Proprioception is intact. No peripheral adenopathy or edema is identified. No motor or sensory levels are noted. Crude visual fields are within normal range.  LABORATORY DATA: Pathology reports reviewed    RADIOLOGY RESULTS: Mammogram ultrasound reviewed compatible with above-stated findings   IMPRESSION: ER positive  invasive lobular carcinoma of the right breast as was wide local excision and sentinel node biopsy in 68 year old female with history of prior radiation therapy to her left breast  PLAN: At this time I recommended hypofractionated course of radiation therapy to her right breast.  Will plan on treating her right breast over 3 weeks boosting her scar another 1000 cGy using photon beam therapy.  Risk benefits of treatment clued skin reaction fatigue alteration blood counts possible inclusion of superficial lung all were discussed in detail with the patient.  I will give her another week or 2 to heal prior to simulation and allow some possible resolution of her seroma.  Patient also be candidate for endocrine therapy after completion of radiation.  I would like to take this opportunity to thank you for allowing me to participate in the care of your patient.SABRA Marcey Penton, MD

## 2023-10-31 NOTE — Therapy (Signed)
 OUTPATIENT OCCUPATIONAL THERAPY BREAST CANCER  POSTOP EVALUATION   Patient Name: AINSLEE SOU MRN: [id number] DOB:1955/03/16, 68 y.o., female Today's Date: 10/31/2023  END OF SESSION:  OT End of Session - 10/31/23 1517     Visit Number 1    Number of Visits 3    Date for Recertification  12/26/23    OT Start Time 0930    OT Stop Time 1001    OT Time Calculation (min) 31 min    Activity Tolerance Patient tolerated treatment well    Behavior During Therapy WFL for tasks assessed/performed          Past Medical History:  Diagnosis Date   Ankylosing spondylitis (HCC)    Anxiety    Arthritis    Blood in stool    Breast cancer (HCC) 2010   Left breast- chemo/radiation   Chronic low back pain    Chronic pain    Depression    Headache    Hepatitis C 2007   Hip fracture (HCC)    History of blood transfusion    Hypertension    Insomnia    OA (osteoarthritis)    Osteoporosis    Personal history of chemotherapy    Personal history of radiation therapy    Pneumonia    Spinal stenosis of lumbar region    Spondyloarthritis    Past Surgical History:  Procedure Laterality Date   AXILLARY SENTINEL NODE BIOPSY Right 10/10/2023   Procedure: BIOPSY, LYMPH NODE, SENTINEL, AXILLARY;  Surgeon: Marinda Jayson KIDD, MD;  Location: [facility];  Service: General;  Laterality: Right;   BREAST BIOPSY Right 1983   neg   BREAST BIOPSY Right 09/19/2023   US  RT BREAST BX W LOC DEV 1ST LESION IMG BX SPEC US  GUIDE 09/19/2023 [facility]   BREAST BIOPSY Right 10/04/2023   MM RT BREAST SAVI/RF TAG 1ST LESION MAMMO GUIDE 10/04/2023 [facility]   BREAST EXCISIONAL BIOPSY Left 2010   triple neg   BREAST LUMPECTOMY Left 2010   BREAST LUMPECTOMY WITH RADIO FREQUENCY LOCALIZER Right 10/10/2023   Procedure: BREAST LUMPECTOMY WITH RADIO FREQUENCY LOCALIZER;  Surgeon: Marinda Jayson KIDD, MD;  Location: [facility];  Service: General;  Laterality: Right;   FEMUR FRACTURE SURGERY     FOOT SURGERY  2000  and 2003   FRACTURE SURGERY     INTRAMEDULLARY (IM) NAIL INTERTROCHANTERIC Right 01/12/2015   Procedure: INTRAMEDULLARY (IM) NAIL INTERTROCHANTRIC;  Surgeon: Kayla Pinal, MD;  Location: [facility];  Service: Orthopedics;  Laterality: Right;   KNEE SURGERY Left 2004   SHOULDER ARTHROSCOPY WITH SUBACROMIAL DECOMPRESSION, ROTATOR CUFF REPAIR AND BICEP TENDON REPAIR Right 07/12/2021   Procedure: RIGHT SHOULDER ARTHROSCOPY WITH DEBRIDEMENT, DECOMPRESSION, ROTATOR CUFF REPAIR, AND  BICEPS TENODESIS.;  Surgeon: Edie Norleen PARAS, MD;  Location: [facility];  Service: Orthopedics;  Laterality: Right;   TUBAL LIGATION     Patient Active Problem List   Diagnosis Date Noted   Invasive ductal carcinoma of right breast (HCC) 09/27/2023   Cellulitis of left hand 03/18/2023   Cat bite 03/18/2023   Hardening of the aorta (main artery of the heart) 04/07/2020   Community acquired bilateral lower lobe pneumonia 08/28/2019   Acute pyelonephritis 08/27/2019   Sepsis secondary to UTI (HCC) 08/26/2019   Tobacco abuse 10/17/2018   Sedative, 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, hypnotic or anxiolytic-induced disorder (HCC) 02/03/2018   Depression, major, recurrent, moderate (HCC) 02/03/2018   Moderate benzodiazepine use disorder (HCC) 02/03/2018   Degenerative disc disease, lumbar 09/20/2016  Shoulder joint pain 09/18/2016   Hypertension    Ankylosing spondylitis (HCC)    OA (osteoarthritis)    Osteoporosis    Malignant neoplasm of left female breast (HCC) 09/12/2015   Cocaine abuse (HCC) 08/18/2015   Alcohol abuse 08/03/2015   Chronic use of opiate drug for therapeutic purpose 05/19/2015   Leg reflex sympathetic dystrophy, right 05/19/2015   Fracture of shaft of femur (HCC) 01/12/2015   Anxiety 01/12/2015   Hypokalemia 01/12/2015   Anxiety and depression 12/24/2014   Chronic bilateral low back pain with bilateral sciatica 12/24/2014   Substance induced mood disorder (HCC) 10/27/2014    Chronic pain in right foot 10/20/2014   Acute bronchitis 10/20/2014   Insomnia 06/05/2014   Chronic pain syndrome 05/15/2014   Raynaud's phenomenon without gangrene 05/15/2014   Spinal stenosis of lumbar region 05/15/2014   Spondyloarthritis 05/15/2014   Generalized anxiety disorder 05/04/2014   Hepatitis C 03/12/2014   Midline low back pain without sciatica 03/12/2014   History of breast cancer in female 12/23/2013   Chronic pain 12/23/2013   Ankylosing spondylitis of lumbosacral region [facility]) 12/23/2013   Mood disorder 12/23/2013   Osteoarthritis 10/05/2011    PCP: Donal FNP  REFERRING PROVIDER: Dr  Jacobo  REFERRING DIAG: R lumpectomy  THERAPY DIAG:  Stiffness of right shoulder, not elsewhere classified  Rationale for Evaluation and Treatment: Rehabilitation  ONSET DATE: 10/10/23  SUBJECTIVE:                                                                                                                                                                                           SUBJECTIVE STATEMENT: Patient reports she is here today after being refer by one of her medical team  after having  left lumpectomy  PERTINENT HISTORY: had R lumpectomy by DR Marinda on 10/10/23 ASSESSMENT: Clinical stage Ia ER/PR positive, 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  Oncotype Dx score 10, low risk.   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  She has an appointment with surgery next week to discuss lumpectomy.  She would likely require adjuvant XRT and letrozole for 5 years.  Tumor size is only 6 mm, therefore chemotherapy will be unlikely but will send Oncotype for completeness.  Return to clinic 2 weeks after surgery to discuss her final pathology results and treatment planning. 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  She did not require an aromatase inhibitor given the ER/PR negative nature of her malignancy. Anxiety: Patient was given 30 tabs of Xanax  with no refills to help her with insomnia and anxiety during her workup phase, but she will not receive any further refills from this clinic. Pain: Secondary to ankylosing spondylitis.  Patient is not to receive any pain medication from this clinic .   PATIENT GOALS:   reduce lymphedema risk and learn post op HEP.   PAIN:  Are you having pain? 7/10 in neck - per pt she slept wrong 2 nights ago  PRECAUTIONS: Active CA , Lymphedema     HAND DOMINANCE: right  WEIGHT BEARING RESTRICTIONS: No  FALLS:  Has patient fallen in last 6 months? No  LIVING ENVIRONMENT: Patient lives with: alone  OCCUPATION: Retired  LEISURE: read, watch tv, prior to Cancer done own housework - dog that she walk   OBJECTIVE:  COGNITION: Overall cognitive status: Within functional limits for tasks assessed    POSTURE:  Forward head and rounded shoulders posture  UPPER EXTREMITY AROM/PROM: Shoulder AROM WFL - slight pull end range   CERVICAL AROM: NT - per report pain in neck - slept wrong 2 nights ago   UPPER EXTREMITY STRENGTH: WFL  LYMPHEDEMA ASSESSMENTS:    LYMPHEDEMA/ONCOLOGY QUESTIONNAIRE - 10/31/23 0001       Right Upper Extremity Lymphedema   15 cm Proximal to Olecranon Process 36 cm    10 cm Proximal to Olecranon Process 36.5 cm    Olecranon Process 26.5 cm      Left Upper Extremity Lymphedema   15 cm Proximal to Olecranon Process 37.8 cm    10 cm Proximal to Olecranon Process 34.5 cm    Olecranon Process 28 cm          L-DEX LYMPHEDEMA SCREENING:  SOZO not done - pt had L lumpectomy in 2010 with radiation  Dr. Linnie did her lumpectomy 2010.  Unknown amount of  lymph nodes removed.   PATIENT EDUCATION:  Education details: Lymphedema risk reduction reviewed with patient and post op shoulder/posture HEP - Person educated: Patient Education method: Explanation, Demonstration, Handout Education comprehension: Patient verbalized understanding and returned demonstration  HOME EXERCISE PROGRAM: Active assisted range of motion in supine for shoulder flexion and abduction.  As well as external rotation radiation position.  Patient able to get in radiation position with slight pull but no pain. Did provide patient with scapular retraction exercises to be done 5 times a day during the day.  No pain.  10 reps each. Patient reported actually felt good on her neck.   ASSESSMENT:  CLINICAL IMPRESSION: Patient presented OT evaluation with a diagnosis of right lumpectomy on 10/10/2023 by Dr. Marinda.  Patient doing well with active range of motion within functional limits.  Slight pull at endrange.  But able to do external rotation and get into radiation position.  Patient did complain today of neck pain because she slept wrong.  Provided patient with some active assisted range of motion in supine.  As well as scapular retraction 3-5 times a day during the day.  Patient said it actually felt good in her neck.  L-Dex score was not done because patient had a left lumpectomy in 2010.SABRA  Plan is for radiation.  Patient to follow-up with me as needed.  Pt will benefit from skilled therapeutic intervention to improve on the following deficits: Decreased knowledge of precautions and lymphedema  education, impaired UE functional use, pain, decreased ROM, postural dysfunction.   OT treatment/interventions: ADL/self-care home management, pt/family education, therapeutic exercise,manual therapy  REHAB POTENTIAL: Good  CLINICAL DECISION MAKING: Stable/uncomplicated  EVALUATION COMPLEXITY: Low   GOALS: Goals reviewed with patient? YES  LONG TERM GOALS: (STG=LTG)    Name  Target Date Goal status  1 Pt will be able to verbalize understanding of pertinent lymphedema risk reduction practices relevant to her dx specifically related to skin care.  Baseline:  No knowledge Today Achieved at eval  2 Pt will be able to return demo and/or verbalize understanding of the post op HEP related to regaining shoulder ROM. Baseline:  No knowledge Today  Achieved at eval       4 Pt will demo she has regained full shoulder ROM and function post operatively compared to baselines.  Baseline: See objective measurements taken today. Today Initial    PLAN:  OT FREQUENCY/DURATION: EVAL and 1 follow up appointment as needed  PLAN FOR NEXT SESSION: Occupational Therapy Information for After Breast Cancer Surgery/Treatment:  Lymphedema is a swelling condition that you may be at risk for in your arm if you have lymph nodes removed from the armpit area.  After a sentinel node biopsy, the risk is approximately 5-9% and is higher after an axillary node dissection.  There is treatment available for this condition and it is not life-threatening.  Contact your physician or occupational therapist with concerns. You may begin the 4 shoulder/posture exercises (see additional sheet) when permitted by your physician (typically a week after surgery).  If you have drains, you may need to wait until those are removed before beginning range of motion exercises.  A general recommendation is to not lift your arms above shoulder height until drains are removed.  These exercises should be done to your tolerance and gently.  This is not a no pain/no gain type of recovery so listen to your body and stretch into the range of motion that you can tolerate, stopping if you have pain.  If you are having immediate reconstruction, ask your plastic surgeon about doing exercises as he or she may want you to wait. .  While undergoing any medical procedure or treatment, try to avoid blood pressure being taken or needle  sticks from occurring on the arm on the side of cancer.   This recommendation begins after surgery and continues for the rest of your life.  This may help reduce your risk of getting lymphedema (swelling in your arm). An excellent resource for those seeking information on lymphedema is the National Lymphed[employer] Network's web site. It can be accessed at www.lymphnet.org If you notice swelling in your hand, arm or breast at any time following surgery (even if it is many years from now), please contact your doctor or occupational therapist to discuss this.  Lymphedema can be treated at any time but it is easier for you if it is treated early on.  If you feel like your shoulder motion is not returning to normal in a reasonable amount of time, please contact your surgeon or occupational therapist.  [facility] [phone]. [address], [place], [place] [address]     Ancel Peters, OTR/L,CLT 10/31/2023, 3:56 PM

## 2023-11-08 ENCOUNTER — Ambulatory Visit: Admitting: Pediatrics

## 2023-11-11 ENCOUNTER — Emergency Department
Admission: EM | Admit: 2023-11-11 | Discharge: 2023-11-12 | Disposition: A | Discharge status: Home / Self Care | Attending: Emergency Medicine | Admitting: Emergency Medicine

## 2023-11-11 ENCOUNTER — Other Ambulatory Visit: Payer: Self-pay

## 2023-11-11 DIAGNOSIS — M542 Cervicalgia: Secondary | ICD-10-CM | POA: Diagnosis present

## 2023-11-11 DIAGNOSIS — Z853 Personal history of malignant neoplasm of breast: Secondary | ICD-10-CM | POA: Diagnosis not present

## 2023-11-11 DIAGNOSIS — I10 Essential (primary) hypertension: Secondary | ICD-10-CM | POA: Insufficient documentation

## 2023-11-11 DIAGNOSIS — M436 Torticollis: Secondary | ICD-10-CM | POA: Insufficient documentation

## 2023-11-11 MED ORDER — OXYCODONE-ACETAMINOPHEN 5-325 MG PO TABS
1.0000 | ORAL_TABLET | Freq: Once | ORAL | Status: AC
  Administered 2023-11-12: 1 via ORAL
  Filled 2023-11-11: qty 1

## 2023-11-11 MED ORDER — ONDANSETRON 4 MG PO TBDP
4.0000 mg | ORAL_TABLET | Freq: Once | ORAL | Status: AC
  Administered 2023-11-12: 4 mg via ORAL
  Filled 2023-11-11: qty 1

## 2023-11-11 MED ORDER — ONDANSETRON 4 MG PO TBDP: 4.0000 mg | ORAL_TABLET | Freq: Four times a day (QID) | ORAL | 0 refills | Status: AC | PRN

## 2023-11-11 MED ORDER — OXYCODONE HCL 5 MG PO TABS: 5.0000 mg | ORAL_TABLET | Freq: Three times a day (TID) | ORAL | 0 refills | Status: DC | PRN

## 2023-11-11 MED ORDER — IBUPROFEN 600 MG PO TABS
600.0000 mg | ORAL_TABLET | Freq: Once | ORAL | Status: AC
  Administered 2023-11-12: 600 mg via ORAL
  Filled 2023-11-11: qty 1

## 2023-11-11 MED ORDER — IBUPROFEN 600 MG PO TABS: 600.0000 mg | ORAL_TABLET | Freq: Four times a day (QID) | ORAL | 0 refills | Status: AC | PRN

## 2023-11-11 NOTE — ED Triage Notes (Signed)
 Pt to ED via EMS from home, pt repotrs neck pain x3 days no injury or trauma to cause pain. Worse with movement,

## 2023-11-11 NOTE — ED Triage Notes (Signed)
 Pt presents via EMS c/o neck pain x3 weeks. Reports neck stiffness and pain with moving head. EMS denies fever  EMS BP 121/80

## 2023-11-11 NOTE — Discharge Instructions (Addendum)
 You may alternate over the counter Tylenol  1000 mg every 6 hours as needed for pain, fever and Ibuprofen 600 mg every 6-8 hours as needed for pain, fever.  Please take Ibuprofen with food.  Do not take more than 4000 mg of Tylenol  (acetaminophen ) in a 24 hour period.   You are being provided a prescription for opiates (also known as narcotics) for pain control.  Opiates can be addictive and should only be used when absolutely necessary for pain control when other alternatives do not work.  We recommend you only use them for the recommended amount of time and only as prescribed.  Please do not take with other sedative medications or alcohol.  Please do not drive, operate machinery, make important decisions while taking opiates.  Please note that these medications can be addictive and have high abuse potential.  Patients can become addicted to narcotics after only taking them for a few days.  Please keep these medications locked away from children, teenagers or any family members with history of substance abuse.  Narcotic pain medicine may also make you constipated.  You may use over-the-counter medications such as MiraLAX, Colace to prevent constipation.  If you become constipated, you may use over-the-counter enemas as needed.  Itching and nausea are also common side effects of narcotic pain medication.  If you develop uncontrolled vomiting or a rash, please stop these medications and seek medical care.

## 2023-11-11 NOTE — ED Provider Notes (Signed)
 [facility] Provider Note    Event Date/Time   First MD Initiated Contact with Patient 11/11/23 2318     (approximate)   History   Neck Pain   HPI  Janet Zuniga is a 68 y.o. female with previous history of stage I breast cancer status postlumpectomy, hepatitis C, hypertension, chronic pain who presents to the emergency department with neck pain ongoing for the past 3 to 4 weeks.  States she is unable to turn her head in either direction or flex her neck without pain.  She takes Soma  already for chronic pain and she states that this, Lidoderm  patches, IcyHot is not helping her pain and she is not able to sleep.  She states she feels she needs something stronger.  She denies any injury to her neck.  Pain is over the trapezius muscles bilaterally but no midline tenderness.  No numbness, tingling, weakness, bowel or bladder incontinence.  Able to ambulate.  No prior neck surgery.  No fever.   History provided by patient and sister.    Past Medical History:  Diagnosis Date   Ankylosing spondylitis (HCC)    Anxiety    Arthritis    Blood in stool    Breast cancer (HCC) 2010   Left breast- chemo/radiation   Chronic low back pain    Chronic pain    Depression    Headache    Hepatitis C 2007   Hip fracture (HCC)    History of blood transfusion    Hypertension    Insomnia    OA (osteoarthritis)    Osteoporosis    Personal history of chemotherapy    Personal history of radiation therapy    Pneumonia    Spinal stenosis of lumbar region    Spondyloarthritis     Past Surgical History:  Procedure Laterality Date   AXILLARY SENTINEL NODE BIOPSY Right 10/10/2023   Procedure: BIOPSY, LYMPH NODE, SENTINEL, AXILLARY;  Surgeon: Marinda Jayson KIDD, MD;  Location: [facility];  Service: General;  Laterality: Right;   BREAST BIOPSY Right 1983   neg   BREAST BIOPSY Right 09/19/2023   US  RT BREAST BX W LOC DEV 1ST LESION IMG BX SPEC US  GUIDE 09/19/2023 [facility]    BREAST BIOPSY Right 10/04/2023   MM RT BREAST SAVI/RF TAG 1ST LESION MAMMO GUIDE 10/04/2023 [facility]   BREAST EXCISIONAL BIOPSY Left 2010   triple neg   BREAST LUMPECTOMY Left 2010   BREAST LUMPECTOMY WITH RADIO FREQUENCY LOCALIZER Right 10/10/2023   Procedure: BREAST LUMPECTOMY WITH RADIO FREQUENCY LOCALIZER;  Surgeon: Marinda Jayson KIDD, MD;  Location: [facility];  Service: General;  Laterality: Right;   FEMUR FRACTURE SURGERY     FOOT SURGERY  2000 and 2003   FRACTURE SURGERY     INTRAMEDULLARY (IM) NAIL INTERTROCHANTERIC Right 01/12/2015   Procedure: INTRAMEDULLARY (IM) NAIL INTERTROCHANTRIC;  Surgeon: Kayla Pinal, MD;  Location: [facility];  Service: Orthopedics;  Laterality: Right;   KNEE SURGERY Left 2004   SHOULDER ARTHROSCOPY WITH SUBACROMIAL DECOMPRESSION, ROTATOR CUFF REPAIR AND BICEP TENDON REPAIR Right 07/12/2021   Procedure: RIGHT SHOULDER ARTHROSCOPY WITH DEBRIDEMENT, DECOMPRESSION, ROTATOR CUFF REPAIR, AND  BICEPS TENODESIS.;  Surgeon: Edie Norleen PARAS, MD;  Location: [facility];  Service: Orthopedics;  Laterality: Right;   TUBAL LIGATION      MEDICATIONS:  Prior to Admission medications   Medication Sig Start Date End Date Taking? Authorizing Provider  busPIRone  (BUSPAR ) 30 MG tablet Take 30 mg by mouth in the morning  and at bedtime. 07/18/17   [provider]  Calcium Carbonate-Vit D-Min (CALCIUM 1200 PO) Take 1,200 mg by mouth daily.    [provider]  Cholecalciferol (VITAMIN D) 50 MCG (2000 UT) tablet Take 4,000 Units by mouth daily.    [provider]  FLUoxetine  (PROZAC ) 20 MG capsule Take 20 mg by mouth daily. 01/15/23   [provider]  rosuvastatin (CRESTOR) 20 MG tablet Take 20 mg by mouth every 14 (fourteen) days. 06/17/22   [provider]  tiZANidine (ZANAFLEX) 4 MG tablet Take 4 mg by mouth every 6 (six) hours as needed for muscle spasms.    [provider]    Physical Exam   Triage Vital Signs: ED Triage  Vitals  Encounter Vitals Group     BP 11/11/23 2112 128/77     Girls Systolic BP Percentile --      Girls Diastolic BP Percentile --      Boys Systolic BP Percentile --      Boys Diastolic BP Percentile --      Pulse Rate 11/11/23 2112 84     Resp 11/11/23 2112 18     Temp 11/11/23 2112 98 F (36.7 C)     Temp Source 11/11/23 2112 Oral     SpO2 11/11/23 2112 97 %     Weight 11/11/23 2113 163 lb (73.9 kg)     Height 11/11/23 2113 4' 11 (1.499 m)     Head Circumference --      Peak Flow --      Pain Score 11/11/23 2117 10     Pain Loc --      Pain Education --      Exclude from Growth Chart --     Most recent vital signs: Vitals:   11/11/23 2112  BP: 128/77  Pulse: 84  Resp: 18  Temp: 98 F (36.7 C)  SpO2: 97%    CONSTITUTIONAL: Alert, responds appropriately to questions. Well-appearing; well-nourished HEAD: Normocephalic, atraumatic EYES: Conjunctivae clear, pupils appear equal, sclera nonicteric ENT: normal nose; moist mucous membranes NECK: Supple, normal ROM, no midline spinal tenderness or step-off or deformity, tender over the bilateral trapezius muscles that are tight with no crepitus, ecchymosis or increased redness or warmth CARD: RRR; S1 and S2 appreciated RESP: Normal chest excursion without splinting or tachypnea; breath sounds clear and equal bilaterally; no wheezes, no rhonchi, no rales, no hypoxia or respiratory distress, speaking full sentences ABD/GI: Non-distended; soft, non-tender, no rebound, no guarding, no peritoneal signs BACK: The back appears normal EXT: Normal ROM in all joints; no deformity noted, no edema SKIN: Normal color for age and race; warm; no rash on exposed skin NEURO: Moves all extremities equally, normal speech, normal sensation, no hyperreflexia, normal gait PSYCH: The patient's mood and manner are appropriate.   ED Results / Procedures / Treatments   LABS: (all labs ordered are listed, but only abnormal results are  displayed) Labs Reviewed - No data to display   EKG:   RADIOLOGY: My personal review and interpretation of imaging:    I have personally reviewed all radiology reports.   No results found.   PROCEDURES:  Critical Care performed: No     Procedures    IMPRESSION / MDM / ASSESSMENT AND PLAN / ED COURSE  I reviewed the triage vital signs and the nursing notes.    Patient here with acute torticollis.   DIFFERENTIAL DIAGNOSIS (includes but not limited to):   Torticollis, trapezius muscle spasm/strain,  doubt cervical spine fracture, cervical myelopathy, epidural abscess or hematoma, discitis or osteomyelitis, transverse myelitis, meningitis   Patient's presentation is most consistent with acute complicated illness / injury requiring diagnostic workup.   PLAN: Will provide with pain medication here.  Will send with prescriptions for narcotic pain medication as well as ibuprofen .  Her creatinine on most recent labs was normal.  Recommended stretching, alternating heat and ice, gentle massage.  No indication for emergent imaging today.  No red flag symptoms.  Does have history of stage I breast cancer but again no midline tenderness and pain is over the trapezius muscles.  Low suspicion for metastasis to the bone or pathological fracture.   MEDICATIONS GIVEN IN ED: Medications  oxyCODONE -acetaminophen  (PERCOCET/ROXICET) 5-325 MG per tablet 1 tablet (has no administration in time range)  ondansetron  (ZOFRAN -ODT) disintegrating tablet 4 mg (has no administration in time range)  ibuprofen  (ADVIL ) tablet 600 mg (has no administration in time range)     ED COURSE:  At this time, I do not feel there is any life-threatening condition present. I reviewed all nursing notes, vitals, pertinent previous records.  All lab and urine results, EKGs, imaging ordered have been independently reviewed and interpreted by myself.  I reviewed all available radiology reports from any imaging ordered  this visit.  Based on my assessment, I feel the patient is safe to be discharged home without further emergent workup and can continue workup as an outpatient as needed. Discussed all findings, treatment plan as well as usual and customary return precautions.  They verbalize understanding and are comfortable with this plan.  Outpatient follow-up has been provided as needed.  All questions have been answered.    CONSULTS:  none   OUTSIDE RECORDS REVIEWED: Reviewed most recent oncology note.       FINAL CLINICAL IMPRESSION(S) / ED DIAGNOSES   Final diagnoses:  Torticollis, acute     Rx / DC Orders   ED Discharge Orders          Ordered    ibuprofen  (ADVIL ) 600 MG tablet  Every 6 hours PRN        11/11/23 2340    oxyCODONE  (ROXICODONE ) 5 MG immediate release tablet  Every 8 hours PRN        11/11/23 2340    ondansetron  (ZOFRAN -ODT) 4 MG disintegrating tablet  Every 6 hours PRN        11/11/23 2340             Note:  This document was prepared using Dragon voice recognition software and may include unintentional dictation errors.   Clodagh Odenthal, Josette SAILOR, DO 11/11/23 [phone]

## 2023-11-13 ENCOUNTER — Encounter: Payer: Self-pay | Admitting: *Deleted

## 2023-11-13 ENCOUNTER — Ambulatory Visit
Admission: RE | Admit: 2023-11-13 | Discharge: 2023-11-13 | Disposition: A | Discharge status: Home / Self Care | Source: Ambulatory Visit | Attending: Radiation Oncology | Admitting: Radiation Oncology

## 2023-11-13 DIAGNOSIS — C50911 Malignant neoplasm of unspecified site of right female breast: Secondary | ICD-10-CM | POA: Insufficient documentation

## 2023-11-13 DIAGNOSIS — Z510 Encounter for antineoplastic radiation therapy: Secondary | ICD-10-CM | POA: Insufficient documentation

## 2023-11-15 ENCOUNTER — Other Ambulatory Visit: Payer: Self-pay | Admitting: *Deleted

## 2023-11-15 DIAGNOSIS — C50911 Malignant neoplasm of unspecified site of right female breast: Secondary | ICD-10-CM

## 2023-11-20 ENCOUNTER — Ambulatory Visit
Admission: RE | Admit: 2023-11-20 | Discharge: 2023-11-20 | Disposition: A | Source: Ambulatory Visit | Attending: Radiation Oncology | Admitting: Radiation Oncology

## 2023-11-21 ENCOUNTER — Ambulatory Visit
Admission: RE | Admit: 2023-11-21 | Discharge: 2023-11-21 | Disposition: A | Discharge status: Home / Self Care | Source: Ambulatory Visit | Attending: Radiation Oncology | Admitting: Radiation Oncology

## 2023-11-21 ENCOUNTER — Other Ambulatory Visit: Payer: Self-pay

## 2023-11-21 LAB — RAD ONC ARIA SESSION SUMMARY
Course Elapsed Days: 0
Plan Fractions Treated to Date: 1
Plan Prescribed Dose Per Fraction: 2.66 Gy
Plan Total Fractions Prescribed: 16
Plan Total Prescribed Dose: 42.56 Gy
Reference Point Dosage Given to Date: 2.66 Gy
Reference Point Session Dosage Given: 2.66 Gy
Session Number: 1

## 2023-11-22 ENCOUNTER — Ambulatory Visit
Admission: RE | Admit: 2023-11-22 | Discharge: 2023-11-22 | Disposition: A | Source: Ambulatory Visit | Attending: Radiation Oncology | Admitting: Radiation Oncology

## 2023-11-22 ENCOUNTER — Other Ambulatory Visit: Payer: Self-pay

## 2023-11-22 LAB — RAD ONC ARIA SESSION SUMMARY
Course Elapsed Days: 1
Plan Fractions Treated to Date: 2
Plan Prescribed Dose Per Fraction: 2.66 Gy
Plan Total Fractions Prescribed: 16
Plan Total Prescribed Dose: 42.56 Gy
Reference Point Dosage Given to Date: 5.32 Gy
Reference Point Session Dosage Given: 2.66 Gy
Session Number: 2

## 2023-11-23 ENCOUNTER — Ambulatory Visit

## 2023-11-23 ENCOUNTER — Ambulatory Visit
Admission: RE | Admit: 2023-11-23 | Discharge: 2023-11-23 | Disposition: A | Discharge status: Home / Self Care | Source: Ambulatory Visit | Attending: Radiation Oncology | Admitting: Radiation Oncology

## 2023-11-23 ENCOUNTER — Other Ambulatory Visit: Payer: Self-pay

## 2023-11-23 LAB — RAD ONC ARIA SESSION SUMMARY
Course Elapsed Days: 2
Plan Fractions Treated to Date: 3
Plan Prescribed Dose Per Fraction: 2.66 Gy
Plan Total Fractions Prescribed: 16
Plan Total Prescribed Dose: 42.56 Gy
Reference Point Dosage Given to Date: 7.98 Gy
Reference Point Session Dosage Given: 2.66 Gy
Session Number: 3

## 2023-11-26 ENCOUNTER — Ambulatory Visit

## 2023-11-26 ENCOUNTER — Other Ambulatory Visit: Payer: Self-pay

## 2023-11-26 ENCOUNTER — Inpatient Hospital Stay: Attending: Oncology

## 2023-11-26 ENCOUNTER — Ambulatory Visit
Admission: RE | Admit: 2023-11-26 | Discharge: 2023-11-26 | Disposition: A | Discharge status: Home / Self Care | Source: Ambulatory Visit | Attending: Radiation Oncology | Admitting: Radiation Oncology

## 2023-11-26 DIAGNOSIS — C50911 Malignant neoplasm of unspecified site of right female breast: Secondary | ICD-10-CM

## 2023-11-26 DIAGNOSIS — Z170 Estrogen receptor positive status [ER+]: Secondary | ICD-10-CM | POA: Insufficient documentation

## 2023-11-26 DIAGNOSIS — Z1721 Progesterone receptor positive status: Secondary | ICD-10-CM | POA: Insufficient documentation

## 2023-11-26 DIAGNOSIS — C50411 Malignant neoplasm of upper-outer quadrant of right female breast: Secondary | ICD-10-CM | POA: Insufficient documentation

## 2023-11-26 LAB — CBC (CANCER CENTER ONLY)
HCT: 36.9 % (ref 36.0–46.0)
Hemoglobin: 12.4 g/dL (ref 12.0–15.0)
MCH: 30.6 pg (ref 26.0–34.0)
MCHC: 33.6 g/dL (ref 30.0–36.0)
MCV: 91.1 fL (ref 80.0–100.0)
Platelet Count: 291 K/uL (ref 150–400)
RBC: 4.05 MIL/uL (ref 3.87–5.11)
RDW: 13.2 % (ref 11.5–15.5)
WBC Count: 9.9 K/uL (ref 4.0–10.5)
nRBC: 0 % (ref 0.0–0.2)

## 2023-11-26 LAB — RAD ONC ARIA SESSION SUMMARY
Course Elapsed Days: 5
Plan Fractions Treated to Date: 4
Plan Prescribed Dose Per Fraction: 2.66 Gy
Plan Total Fractions Prescribed: 16
Plan Total Prescribed Dose: 42.56 Gy
Reference Point Dosage Given to Date: 10.64 Gy
Reference Point Session Dosage Given: 2.66 Gy
Session Number: 4

## 2023-11-27 ENCOUNTER — Ambulatory Visit
Admission: RE | Admit: 2023-11-27 | Discharge: 2023-11-27 | Disposition: A | Source: Ambulatory Visit | Attending: Radiation Oncology | Admitting: Radiation Oncology

## 2023-11-27 ENCOUNTER — Other Ambulatory Visit: Payer: Self-pay

## 2023-11-27 LAB — RAD ONC ARIA SESSION SUMMARY
Course Elapsed Days: 6
Plan Fractions Treated to Date: 5
Plan Prescribed Dose Per Fraction: 2.66 Gy
Plan Total Fractions Prescribed: 16
Plan Total Prescribed Dose: 42.56 Gy
Reference Point Dosage Given to Date: 13.3 Gy
Reference Point Session Dosage Given: 2.66 Gy
Session Number: 5

## 2023-11-28 ENCOUNTER — Other Ambulatory Visit: Payer: Self-pay

## 2023-11-28 ENCOUNTER — Ambulatory Visit
Admission: RE | Admit: 2023-11-28 | Discharge: 2023-11-28 | Disposition: A | Source: Ambulatory Visit | Attending: Radiation Oncology | Admitting: Radiation Oncology

## 2023-11-28 LAB — RAD ONC ARIA SESSION SUMMARY
Course Elapsed Days: 7
Plan Fractions Treated to Date: 6
Plan Prescribed Dose Per Fraction: 2.66 Gy
Plan Total Fractions Prescribed: 16
Plan Total Prescribed Dose: 42.56 Gy
Reference Point Dosage Given to Date: 15.96 Gy
Reference Point Session Dosage Given: 2.66 Gy
Session Number: 6

## 2023-12-03 ENCOUNTER — Other Ambulatory Visit: Payer: Self-pay

## 2023-12-03 ENCOUNTER — Ambulatory Visit
Admission: RE | Admit: 2023-12-03 | Discharge: 2023-12-03 | Disposition: A | Source: Ambulatory Visit | Attending: Radiation Oncology | Admitting: Radiation Oncology

## 2023-12-03 DIAGNOSIS — C50911 Malignant neoplasm of unspecified site of right female breast: Secondary | ICD-10-CM | POA: Diagnosis present

## 2023-12-03 DIAGNOSIS — Z170 Estrogen receptor positive status [ER+]: Secondary | ICD-10-CM | POA: Insufficient documentation

## 2023-12-03 DIAGNOSIS — Z1721 Progesterone receptor positive status: Secondary | ICD-10-CM | POA: Insufficient documentation

## 2023-12-03 DIAGNOSIS — C50411 Malignant neoplasm of upper-outer quadrant of right female breast: Secondary | ICD-10-CM | POA: Insufficient documentation

## 2023-12-03 DIAGNOSIS — Z510 Encounter for antineoplastic radiation therapy: Secondary | ICD-10-CM | POA: Diagnosis present

## 2023-12-03 LAB — RAD ONC ARIA SESSION SUMMARY
Course Elapsed Days: 12
Plan Fractions Treated to Date: 7
Plan Prescribed Dose Per Fraction: 2.66 Gy
Plan Total Fractions Prescribed: 16
Plan Total Prescribed Dose: 42.56 Gy
Reference Point Dosage Given to Date: 18.62 Gy
Reference Point Session Dosage Given: 2.66 Gy
Session Number: 7

## 2023-12-04 ENCOUNTER — Ambulatory Visit
Admission: RE | Admit: 2023-12-04 | Discharge: 2023-12-04 | Disposition: A | Source: Ambulatory Visit | Attending: Radiation Oncology | Admitting: Radiation Oncology

## 2023-12-04 ENCOUNTER — Other Ambulatory Visit: Payer: Self-pay

## 2023-12-04 DIAGNOSIS — Z510 Encounter for antineoplastic radiation therapy: Secondary | ICD-10-CM | POA: Diagnosis not present

## 2023-12-04 LAB — RAD ONC ARIA SESSION SUMMARY
Course Elapsed Days: 13
Plan Fractions Treated to Date: 8
Plan Prescribed Dose Per Fraction: 2.66 Gy
Plan Total Fractions Prescribed: 16
Plan Total Prescribed Dose: 42.56 Gy
Reference Point Dosage Given to Date: 21.28 Gy
Reference Point Session Dosage Given: 2.66 Gy
Session Number: 8

## 2023-12-05 ENCOUNTER — Ambulatory Visit

## 2023-12-05 ENCOUNTER — Ambulatory Visit
Admission: RE | Admit: 2023-12-05 | Discharge: 2023-12-05 | Disposition: A | Source: Ambulatory Visit | Attending: Radiation Oncology | Admitting: Radiation Oncology

## 2023-12-05 ENCOUNTER — Other Ambulatory Visit: Payer: Self-pay

## 2023-12-05 DIAGNOSIS — Z510 Encounter for antineoplastic radiation therapy: Secondary | ICD-10-CM | POA: Diagnosis not present

## 2023-12-05 LAB — RAD ONC ARIA SESSION SUMMARY
Course Elapsed Days: 14
Plan Fractions Treated to Date: 9
Plan Prescribed Dose Per Fraction: 2.66 Gy
Plan Total Fractions Prescribed: 16
Plan Total Prescribed Dose: 42.56 Gy
Reference Point Dosage Given to Date: 23.94 Gy
Reference Point Session Dosage Given: 2.66 Gy
Session Number: 9

## 2023-12-06 ENCOUNTER — Other Ambulatory Visit: Payer: Self-pay

## 2023-12-06 ENCOUNTER — Ambulatory Visit
Admission: RE | Admit: 2023-12-06 | Discharge: 2023-12-06 | Disposition: A | Source: Ambulatory Visit | Attending: Radiation Oncology | Admitting: Radiation Oncology

## 2023-12-06 DIAGNOSIS — Z510 Encounter for antineoplastic radiation therapy: Secondary | ICD-10-CM | POA: Diagnosis not present

## 2023-12-06 LAB — RAD ONC ARIA SESSION SUMMARY
Course Elapsed Days: 15
Plan Fractions Treated to Date: 10
Plan Prescribed Dose Per Fraction: 2.66 Gy
Plan Total Fractions Prescribed: 16
Plan Total Prescribed Dose: 42.56 Gy
Reference Point Dosage Given to Date: 26.6 Gy
Reference Point Session Dosage Given: 2.66 Gy
Session Number: 10

## 2023-12-07 ENCOUNTER — Other Ambulatory Visit: Payer: Self-pay

## 2023-12-07 ENCOUNTER — Ambulatory Visit
Admission: RE | Admit: 2023-12-07 | Discharge: 2023-12-07 | Attending: Radiation Oncology | Admitting: Radiation Oncology

## 2023-12-07 DIAGNOSIS — Z510 Encounter for antineoplastic radiation therapy: Secondary | ICD-10-CM | POA: Diagnosis not present

## 2023-12-07 LAB — RAD ONC ARIA SESSION SUMMARY
Course Elapsed Days: 16
Plan Fractions Treated to Date: 11
Plan Prescribed Dose Per Fraction: 2.66 Gy
Plan Total Fractions Prescribed: 16
Plan Total Prescribed Dose: 42.56 Gy
Reference Point Dosage Given to Date: 29.26 Gy
Reference Point Session Dosage Given: 2.66 Gy
Session Number: 11

## 2023-12-10 ENCOUNTER — Other Ambulatory Visit: Payer: Self-pay

## 2023-12-10 ENCOUNTER — Inpatient Hospital Stay

## 2023-12-10 ENCOUNTER — Ambulatory Visit
Admission: RE | Admit: 2023-12-10 | Discharge: 2023-12-10 | Disposition: A | Source: Ambulatory Visit | Attending: Radiation Oncology | Admitting: Radiation Oncology

## 2023-12-10 DIAGNOSIS — Z8619 Personal history of other infectious and parasitic diseases: Secondary | ICD-10-CM | POA: Insufficient documentation

## 2023-12-10 DIAGNOSIS — M810 Age-related osteoporosis without current pathological fracture: Secondary | ICD-10-CM | POA: Insufficient documentation

## 2023-12-10 DIAGNOSIS — C50911 Malignant neoplasm of unspecified site of right female breast: Secondary | ICD-10-CM

## 2023-12-10 DIAGNOSIS — F17210 Nicotine dependence, cigarettes, uncomplicated: Secondary | ICD-10-CM | POA: Insufficient documentation

## 2023-12-10 DIAGNOSIS — Z885 Allergy status to narcotic agent status: Secondary | ICD-10-CM | POA: Insufficient documentation

## 2023-12-10 DIAGNOSIS — Z880 Allergy status to penicillin: Secondary | ICD-10-CM | POA: Insufficient documentation

## 2023-12-10 DIAGNOSIS — Z170 Estrogen receptor positive status [ER+]: Secondary | ICD-10-CM | POA: Insufficient documentation

## 2023-12-10 DIAGNOSIS — C50411 Malignant neoplasm of upper-outer quadrant of right female breast: Secondary | ICD-10-CM | POA: Insufficient documentation

## 2023-12-10 DIAGNOSIS — Z83438 Family history of other disorder of lipoprotein metabolism and other lipidemia: Secondary | ICD-10-CM | POA: Insufficient documentation

## 2023-12-10 DIAGNOSIS — Z923 Personal history of irradiation: Secondary | ICD-10-CM | POA: Insufficient documentation

## 2023-12-10 DIAGNOSIS — Z79899 Other long term (current) drug therapy: Secondary | ICD-10-CM | POA: Insufficient documentation

## 2023-12-10 DIAGNOSIS — Z8261 Family history of arthritis: Secondary | ICD-10-CM | POA: Insufficient documentation

## 2023-12-10 DIAGNOSIS — M549 Dorsalgia, unspecified: Secondary | ICD-10-CM | POA: Insufficient documentation

## 2023-12-10 DIAGNOSIS — Z825 Family history of asthma and other chronic lower respiratory diseases: Secondary | ICD-10-CM | POA: Insufficient documentation

## 2023-12-10 DIAGNOSIS — Z9221 Personal history of antineoplastic chemotherapy: Secondary | ICD-10-CM | POA: Insufficient documentation

## 2023-12-10 DIAGNOSIS — I10 Essential (primary) hypertension: Secondary | ICD-10-CM | POA: Insufficient documentation

## 2023-12-10 DIAGNOSIS — Z8701 Personal history of pneumonia (recurrent): Secondary | ICD-10-CM | POA: Insufficient documentation

## 2023-12-10 DIAGNOSIS — Z1732 Human epidermal growth factor receptor 2 negative status: Secondary | ICD-10-CM | POA: Insufficient documentation

## 2023-12-10 DIAGNOSIS — G8929 Other chronic pain: Secondary | ICD-10-CM | POA: Insufficient documentation

## 2023-12-10 DIAGNOSIS — M1990 Unspecified osteoarthritis, unspecified site: Secondary | ICD-10-CM | POA: Insufficient documentation

## 2023-12-10 DIAGNOSIS — Z8349 Family history of other endocrine, nutritional and metabolic diseases: Secondary | ICD-10-CM | POA: Insufficient documentation

## 2023-12-10 DIAGNOSIS — Z818 Family history of other mental and behavioral disorders: Secondary | ICD-10-CM | POA: Insufficient documentation

## 2023-12-10 DIAGNOSIS — M459 Ankylosing spondylitis of unspecified sites in spine: Secondary | ICD-10-CM | POA: Insufficient documentation

## 2023-12-10 DIAGNOSIS — Z8249 Family history of ischemic heart disease and other diseases of the circulatory system: Secondary | ICD-10-CM | POA: Insufficient documentation

## 2023-12-10 DIAGNOSIS — Z1721 Progesterone receptor positive status: Secondary | ICD-10-CM | POA: Insufficient documentation

## 2023-12-10 DIAGNOSIS — Z510 Encounter for antineoplastic radiation therapy: Secondary | ICD-10-CM | POA: Diagnosis not present

## 2023-12-10 DIAGNOSIS — L988 Other specified disorders of the skin and subcutaneous tissue: Secondary | ICD-10-CM | POA: Insufficient documentation

## 2023-12-10 DIAGNOSIS — Z833 Family history of diabetes mellitus: Secondary | ICD-10-CM | POA: Insufficient documentation

## 2023-12-10 DIAGNOSIS — Z8262 Family history of osteoporosis: Secondary | ICD-10-CM | POA: Insufficient documentation

## 2023-12-10 DIAGNOSIS — Z823 Family history of stroke: Secondary | ICD-10-CM | POA: Insufficient documentation

## 2023-12-10 LAB — CBC (CANCER CENTER ONLY)
HCT: 38.3 % (ref 36.0–46.0)
Hemoglobin: 12.3 g/dL (ref 12.0–15.0)
MCH: 30.2 pg (ref 26.0–34.0)
MCHC: 32.1 g/dL (ref 30.0–36.0)
MCV: 94.1 fL (ref 80.0–100.0)
Platelet Count: 232 K/uL (ref 150–400)
RBC: 4.07 MIL/uL (ref 3.87–5.11)
RDW: 14.5 % (ref 11.5–15.5)
WBC Count: 7 K/uL (ref 4.0–10.5)
nRBC: 0 % (ref 0.0–0.2)

## 2023-12-10 LAB — RAD ONC ARIA SESSION SUMMARY
Course Elapsed Days: 19
Plan Fractions Treated to Date: 12
Plan Prescribed Dose Per Fraction: 2.66 Gy
Plan Total Fractions Prescribed: 16
Plan Total Prescribed Dose: 42.56 Gy
Reference Point Dosage Given to Date: 31.92 Gy
Reference Point Session Dosage Given: 2.66 Gy
Session Number: 12

## 2023-12-11 ENCOUNTER — Ambulatory Visit

## 2023-12-12 ENCOUNTER — Ambulatory Visit
Admission: RE | Admit: 2023-12-12 | Discharge: 2023-12-12 | Disposition: A | Discharge status: Home / Self Care | Source: Ambulatory Visit | Attending: Radiation Oncology | Admitting: Radiation Oncology

## 2023-12-12 ENCOUNTER — Other Ambulatory Visit: Payer: Self-pay

## 2023-12-12 DIAGNOSIS — Z510 Encounter for antineoplastic radiation therapy: Secondary | ICD-10-CM | POA: Diagnosis not present

## 2023-12-12 LAB — RAD ONC ARIA SESSION SUMMARY
Course Elapsed Days: 21
Plan Fractions Treated to Date: 13
Plan Prescribed Dose Per Fraction: 2.66 Gy
Plan Total Fractions Prescribed: 16
Plan Total Prescribed Dose: 42.56 Gy
Reference Point Dosage Given to Date: 34.58 Gy
Reference Point Session Dosage Given: 2.66 Gy
Session Number: 13

## 2023-12-13 ENCOUNTER — Ambulatory Visit
Admission: RE | Admit: 2023-12-13 | Discharge: 2023-12-13 | Disposition: A | Discharge status: Home / Self Care | Source: Ambulatory Visit | Attending: Radiation Oncology | Admitting: Radiation Oncology

## 2023-12-13 ENCOUNTER — Other Ambulatory Visit: Payer: Self-pay

## 2023-12-13 DIAGNOSIS — Z510 Encounter for antineoplastic radiation therapy: Secondary | ICD-10-CM | POA: Diagnosis not present

## 2023-12-13 LAB — RAD ONC ARIA SESSION SUMMARY
Course Elapsed Days: 22
Plan Fractions Treated to Date: 14
Plan Prescribed Dose Per Fraction: 2.66 Gy
Plan Total Fractions Prescribed: 16
Plan Total Prescribed Dose: 42.56 Gy
Reference Point Dosage Given to Date: 37.24 Gy
Reference Point Session Dosage Given: 2.66 Gy
Session Number: 14

## 2023-12-14 ENCOUNTER — Ambulatory Visit

## 2023-12-14 ENCOUNTER — Other Ambulatory Visit: Payer: Self-pay

## 2023-12-14 ENCOUNTER — Inpatient Hospital Stay

## 2023-12-14 ENCOUNTER — Ambulatory Visit
Admission: RE | Admit: 2023-12-14 | Discharge: 2023-12-14 | Disposition: A | Source: Ambulatory Visit | Attending: Radiation Oncology | Admitting: Radiation Oncology

## 2023-12-14 DIAGNOSIS — Z510 Encounter for antineoplastic radiation therapy: Secondary | ICD-10-CM | POA: Diagnosis not present

## 2023-12-14 LAB — RAD ONC ARIA SESSION SUMMARY
Course Elapsed Days: 23
Plan Fractions Treated to Date: 15
Plan Prescribed Dose Per Fraction: 2.66 Gy
Plan Total Fractions Prescribed: 16
Plan Total Prescribed Dose: 42.56 Gy
Reference Point Dosage Given to Date: 39.9 Gy
Reference Point Session Dosage Given: 2.66 Gy
Session Number: 15

## 2023-12-14 NOTE — Progress Notes (Signed)
 [facility] CSW Progress Note  Clinical Child Psychotherapist contacted patient by phone to follow-up on financial concerns.    Interventions: Provided patient with information about the [employer].  Patient qualifies due to receiving food stamps and income.  CSW made referral to Dickey Fritter and Samule Bertrand.       Follow Up Plan:  CSW will follow-up with patient by phone     Janet Zuniga Au, LCSW Clinical Social Worker [facility]

## 2023-12-17 ENCOUNTER — Ambulatory Visit
Admission: RE | Admit: 2023-12-17 | Discharge: 2023-12-17 | Attending: Radiation Oncology | Admitting: Radiation Oncology

## 2023-12-17 ENCOUNTER — Ambulatory Visit

## 2023-12-17 ENCOUNTER — Other Ambulatory Visit: Payer: Self-pay

## 2023-12-17 DIAGNOSIS — Z510 Encounter for antineoplastic radiation therapy: Secondary | ICD-10-CM | POA: Diagnosis not present

## 2023-12-17 LAB — RAD ONC ARIA SESSION SUMMARY
Course Elapsed Days: 26
Plan Fractions Treated to Date: 16
Plan Prescribed Dose Per Fraction: 2.66 Gy
Plan Total Fractions Prescribed: 16
Plan Total Prescribed Dose: 42.56 Gy
Reference Point Dosage Given to Date: 42.56 Gy
Reference Point Session Dosage Given: 2.66 Gy
Session Number: 16

## 2023-12-18 ENCOUNTER — Ambulatory Visit
Admission: RE | Admit: 2023-12-18 | Discharge: 2023-12-18 | Disposition: A | Source: Ambulatory Visit | Attending: Radiation Oncology | Admitting: Radiation Oncology

## 2023-12-18 ENCOUNTER — Ambulatory Visit

## 2023-12-18 ENCOUNTER — Other Ambulatory Visit: Payer: Self-pay

## 2023-12-18 DIAGNOSIS — Z510 Encounter for antineoplastic radiation therapy: Secondary | ICD-10-CM | POA: Diagnosis not present

## 2023-12-18 LAB — RAD ONC ARIA SESSION SUMMARY
Course Elapsed Days: 27
Plan Fractions Treated to Date: 1
Plan Prescribed Dose Per Fraction: 2 Gy
Plan Total Fractions Prescribed: 5
Plan Total Prescribed Dose: 10 Gy
Reference Point Dosage Given to Date: 2 Gy
Reference Point Session Dosage Given: 2 Gy
Session Number: 17

## 2023-12-19 ENCOUNTER — Ambulatory Visit
Admission: RE | Admit: 2023-12-19 | Discharge: 2023-12-19 | Attending: Radiation Oncology | Admitting: Radiation Oncology

## 2023-12-19 ENCOUNTER — Other Ambulatory Visit: Payer: Self-pay

## 2023-12-19 ENCOUNTER — Telehealth: Payer: Self-pay | Admitting: Pharmacy Technician

## 2023-12-19 DIAGNOSIS — Z510 Encounter for antineoplastic radiation therapy: Secondary | ICD-10-CM | POA: Diagnosis not present

## 2023-12-19 LAB — RAD ONC ARIA SESSION SUMMARY
Course Elapsed Days: 28
Plan Fractions Treated to Date: 2
Plan Prescribed Dose Per Fraction: 2 Gy
Plan Total Fractions Prescribed: 5
Plan Total Prescribed Dose: 10 Gy
Reference Point Dosage Given to Date: 4 Gy
Reference Point Session Dosage Given: 2 Gy
Session Number: 18

## 2023-12-19 NOTE — Telephone Encounter (Signed)
 Patient provided financial documentation.  Explained how to submit bills and obtain gas cards.  Meets eligibility criteria for [employer].  Patient approved for [employer].  Janet Zuniga Patient Pharmacologist [facility]

## 2023-12-20 ENCOUNTER — Other Ambulatory Visit: Payer: Self-pay

## 2023-12-20 ENCOUNTER — Ambulatory Visit

## 2023-12-20 DIAGNOSIS — Z510 Encounter for antineoplastic radiation therapy: Secondary | ICD-10-CM | POA: Diagnosis not present

## 2023-12-20 LAB — RAD ONC ARIA SESSION SUMMARY
Course Elapsed Days: 29
Plan Fractions Treated to Date: 3
Plan Prescribed Dose Per Fraction: 2 Gy
Plan Total Fractions Prescribed: 5
Plan Total Prescribed Dose: 10 Gy
Reference Point Dosage Given to Date: 6 Gy
Reference Point Session Dosage Given: 2 Gy
Session Number: 19

## 2023-12-21 ENCOUNTER — Ambulatory Visit

## 2023-12-21 ENCOUNTER — Other Ambulatory Visit: Payer: Self-pay

## 2023-12-21 ENCOUNTER — Encounter: Payer: Self-pay | Admitting: Oncology

## 2023-12-21 ENCOUNTER — Inpatient Hospital Stay: Admitting: Oncology

## 2023-12-21 ENCOUNTER — Ambulatory Visit
Admission: RE | Admit: 2023-12-21 | Discharge: 2023-12-21 | Attending: Radiation Oncology | Admitting: Radiation Oncology

## 2023-12-21 VITALS — BP 138/88 | HR 81 | Temp 97.6°F | Resp 18 | Ht 59.0 in | Wt 166.0 lb

## 2023-12-21 DIAGNOSIS — C50911 Malignant neoplasm of unspecified site of right female breast: Secondary | ICD-10-CM | POA: Diagnosis not present

## 2023-12-21 DIAGNOSIS — Z510 Encounter for antineoplastic radiation therapy: Secondary | ICD-10-CM | POA: Diagnosis not present

## 2023-12-21 LAB — RAD ONC ARIA SESSION SUMMARY
Course Elapsed Days: 30
Plan Fractions Treated to Date: 4
Plan Prescribed Dose Per Fraction: 2 Gy
Plan Total Fractions Prescribed: 5
Plan Total Prescribed Dose: 10 Gy
Reference Point Dosage Given to Date: 8 Gy
Reference Point Session Dosage Given: 2 Gy
Session Number: 20

## 2023-12-21 MED ORDER — LETROZOLE 2.5 MG PO TABS: 2.5000 mg | ORAL_TABLET | Freq: Every day | ORAL | 3 refills | Status: AC

## 2023-12-21 NOTE — Progress Notes (Signed)
 Right breast has sticky glue still is at the sight, the doctor that done the surgery told her to ask Dr. Jacobo to take a look at the site.

## 2023-12-21 NOTE — Progress Notes (Signed)
 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 Oncotype Dx score 10, low risk.  INTERVAL HISTORY: Patient returns to clinic today at the conclusion of her XRT for further evaluation and initiation of letrozole .  She has some skin irritation and an eschar at the site of her previous surgery, but otherwise has tolerated treatment well. She continues to have chronic back pain from ankylosing spondylitis, but otherwise feels well.  She has no neurologic complaints.  She denies any recent fevers or illnesses.  She has a good appetite and denies weight loss.  She has no chest pain, shortness of breath, cough, or hemoptysis.  She denies any nausea, vomiting, constipation, or diarrhea.  She has no urinary complaints.  Patient offers no further specific complaints today.  REVIEW OF SYSTEMS:   Review of Systems  Constitutional: Negative.  Negative for fever, malaise/fatigue and weight loss.  Respiratory: Negative.  Negative for cough, hemoptysis and shortness of breath.   Cardiovascular: Negative.  Negative for chest pain and leg swelling.  Gastrointestinal: Negative.  Negative for abdominal pain.  Genitourinary: Negative.  Negative for dysuria.  Musculoskeletal:  Positive for back pain.  Skin: Negative.  Negative for rash.  Neurological: Negative.  Negative for dizziness, focal weakness, weakness and headaches.  Psychiatric/Behavioral: Negative.  The patient is not nervous/anxious.     As per HPI. Otherwise, a  complete review of systems is negative.  PAST MEDICAL HISTORY: Past Medical History:  Diagnosis Date   Ankylosing spondylitis (HCC)    Anxiety    Arthritis    Blood in stool    Breast cancer (HCC) 2010   Left breast- chemo/radiation   Chronic low back pain    Chronic pain    Depression    Headache    Hepatitis C 2007   Hip fracture (HCC)    History of blood transfusion    Hypertension    Insomnia    OA (osteoarthritis)    Osteoporosis    Personal history of chemotherapy    Personal history of radiation therapy    Pneumonia    Spinal stenosis of lumbar region    Spondyloarthritis     PAST SURGICAL HISTORY: Past Surgical History:  Procedure Laterality Date   AXILLARY SENTINEL NODE BIOPSY Right 10/10/2023   Procedure: BIOPSY, LYMPH NODE, SENTINEL, AXILLARY;  Surgeon: Marinda Jayson KIDD, MD;  Location: [facility];  Service: General;  Laterality: Right;   BREAST BIOPSY Right 1983   neg   BREAST BIOPSY Right 09/19/2023   US  RT BREAST BX W LOC DEV 1ST LESION IMG BX SPEC US  GUIDE 09/19/2023 [facility]   BREAST BIOPSY Right 10/04/2023   MM RT BREAST SAVI/RF TAG 1ST LESION MAMMO GUIDE 10/04/2023 [facility]   BREAST EXCISIONAL BIOPSY Left 2010   triple neg   BREAST LUMPECTOMY Left 2010   BREAST LUMPECTOMY WITH RADIO FREQUENCY LOCALIZER Right 10/10/2023   Procedure: BREAST LUMPECTOMY WITH RADIO FREQUENCY LOCALIZER;  Surgeon: Marinda Jayson KIDD, MD;  Location: [facility];  Service: General;  Laterality: Right;   FEMUR FRACTURE SURGERY  FOOT SURGERY  2000 and 2003   FRACTURE SURGERY     INTRAMEDULLARY (IM) NAIL INTERTROCHANTERIC Right 01/12/2015   Procedure: INTRAMEDULLARY (IM) NAIL INTERTROCHANTRIC;  Surgeon: Kayla Pinal, MD;  Location: [facility];  Service: Orthopedics;  Laterality: Right;   KNEE SURGERY Left 2004   SHOULDER ARTHROSCOPY WITH SUBACROMIAL DECOMPRESSION, ROTATOR CUFF REPAIR AND BICEP TENDON REPAIR Right 07/12/2021   Procedure: RIGHT SHOULDER ARTHROSCOPY WITH  DEBRIDEMENT, DECOMPRESSION, ROTATOR CUFF REPAIR, AND  BICEPS TENODESIS.;  Surgeon: Edie Norleen PARAS, MD;  Location: [facility];  Service: Orthopedics;  Laterality: Right;   TUBAL LIGATION      FAMILY HISTORY: Family History  Problem Relation Age of Onset   Arthritis Mother    Stroke Mother    Heart disease Mother    Hypertension Mother    Hyperlipidemia Mother    Depression Mother    Seizures Mother    Osteoporosis Mother    Hypertension Father    Seizures Sister    Stroke Sister    Heart disease Maternal Aunt    Hypertension Brother    Arthritis Brother    Depression Brother    Diabetes Brother    Hyperlipidemia Brother    COPD Neg Hx    Cancer Neg Hx     ADVANCED DIRECTIVES (Y/N):  N  HEALTH MAINTENANCE: Social History   Tobacco Use   Smoking status: Every Day    Current packs/day: 0.20    Average packs/day: 0.2 packs/day for 25.0 years (5.0 ttl pk-yrs)    Types: Cigarettes    Passive exposure: Past   Smokeless tobacco: Never  Vaping Use   Vaping status: Never Used  Substance Use Topics   Alcohol use: Yes    Alcohol/week: 1.0 standard drink of alcohol    Types: 1 Glasses of wine per week    Comment: occasionally a glass of wine cooler   Drug use: Not Currently    Types: Marijuana     Colonoscopy:  PAP:  Bone density:  Lipid panel:  Allergies  Allergen Reactions   Tramadol  Nausea Only   Penicillins Rash and Other (See Comments)    Has patient had a PCN reaction causing immediate rash, facial/tongue/throat swelling, SOB or lightheadedness with hypotension: Yes Has patient had a PCN reaction causing severe rash involving mucus membranes or skin necrosis: No Has patient had a PCN reaction that required hospitalization No Has patient had a PCN reaction occurring within the last 10 years: No If all of the above answers are NO, then may proceed with Cephalosporin use.     Current Outpatient Medications  Medication Sig Dispense Refill   busPIRone  (BUSPAR )  30 MG tablet Take 30 mg by mouth in the morning and at bedtime.     ibuprofen  (ADVIL ) 600 MG tablet Take 1 tablet (600 mg total) by mouth every 6 (six) hours as needed. 30 tablet 0   ondansetron  (ZOFRAN -ODT) 4 MG disintegrating tablet Take 1 tablet (4 mg total) by mouth every 6 (six) hours as needed for nausea or vomiting. 20 tablet 0   tiZANidine (ZANAFLEX) 4 MG tablet Take 4 mg by mouth every 6 (six) hours as needed for muscle spasms.     Calcium Carbonate-Vit D-Min (CALCIUM 1200 PO) Take 1,200 mg by mouth daily. (Patient not taking: Reported on 12/21/2023)     Cholecalciferol (VITAMIN D) 50 MCG (2000 UT) tablet Take 4,000 Units by mouth daily. (Patient not taking: Reported on 12/21/2023)     FLUoxetine  (PROZAC ) 20 MG capsule Take 20  mg by mouth daily. (Patient not taking: Reported on 12/21/2023)     rosuvastatin (CRESTOR) 20 MG tablet Take 20 mg by mouth every 14 (fourteen) days. (Patient not taking: Reported on 12/21/2023)     No current facility-administered medications for this visit.    OBJECTIVE: Vitals:   12/21/23 1151  BP: 138/88  Pulse: 81  Resp: 18  Temp: 97.6 F (36.4 C)  SpO2: 99%     Body mass index is 33.53 kg/m.    ECOG FS:0 - Asymptomatic  General: Well-developed, well-nourished, no acute distress. Eyes: Pink conjunctiva, anicteric sclera. HEENT: Normocephalic, moist mucous membranes. Breast: Right breast with erythema consistent with XRT treatment.  1 to 2 cm eschar at the site of surgery.  No obvious cellulitis. Lungs: No audible wheezing or coughing. Heart: Regular rate and rhythm. Abdomen: Soft, nontender, no obvious distention. Musculoskeletal: No edema, cyanosis, or clubbing. Neuro: Alert, answering all questions appropriately. Cranial nerves grossly intact. Skin: No rashes or petechiae noted. Psych: Normal affect.  LAB RESULTS:  Lab Results  Component Value Date   NA 138 10/10/2023   K 3.6 10/10/2023   CL 107 10/10/2023   CO2 20 (L) 10/10/2023    GLUCOSE 120 (H) 10/10/2023   BUN 24 (H) 10/10/2023   CREATININE 0.66 10/10/2023   CALCIUM 8.7 (L) 10/10/2023   PROT 7.5 06/14/2023   ALBUMIN 4.0 06/14/2023   AST 29 06/14/2023   ALT 30 06/14/2023   ALKPHOS 57 06/14/2023   BILITOT 0.5 06/14/2023   GFRNONAA >60 10/10/2023   GFRAA 58 (L) 08/29/2019    Lab Results  Component Value Date   WBC 7.0 12/10/2023   NEUTROABS 6.8 03/20/2023   HGB 12.3 12/10/2023   HCT 38.3 12/10/2023   MCV 94.1 12/10/2023   PLT 232 12/10/2023     STUDIES: No results found.   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  Oncotype Dx score 10, low risk.  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  She underwent lumpectomy on October 10, 2023 confirming stage of disease.  Oncotype DX score was low risk, therefore she did not require adjuvant chemotherapy.  She will complete adjuvant XRT on December 24, 2023.  Patient was given a prescription for letrozole  today which is recommended to take for 5 years completing treatment in December 2030.  Return to clinic in 3 months for routine evaluation.   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  She did not require an aromatase inhibitor given the ER/PR negative nature of her malignancy. Anxiety: Chronic and unchanged.  Patient should not receive Xanax  from this clinic. Pain: Secondary to ankylosing spondylitis.  Patient is not to receive any pain medication from this clinic.  Follow-up with rheumatology at [facility] as scheduled.  Okay from an oncology standpoint to continue using Enbrel as needed. Bone health: Will get a baseline bone  mineral density in the next several weeks.   I spent a total of 20 minutes reviewing chart data, face-to-face evaluation with the patient, counseling and coordination of care as detailed above.   Patient expressed understanding and was in agreement with this plan. She also understands that She can call clinic  at any time with any questions, concerns, or complaints.    Cancer Staging  Invasive ductal carcinoma of right breast [facility]) Staging form: Breast, AJCC 8th Edition - Clinical stage from 09/27/2023: Stage IA (cT1b, cN0, cM0, G2, ER+, PR+, HER2-) - Signed by Jacobo Evalene PARAS, MD on 09/27/2023 Histologic grading system: 3 grade system   Evalene PARAS Jacobo, MD   12/21/2023 11:56 AM     "

## 2023-12-23 ENCOUNTER — Emergency Department
Admission: EM | Admit: 2023-12-23 | Discharge: 2023-12-23 | Disposition: A | Discharge status: Home / Self Care | Attending: Emergency Medicine | Admitting: Emergency Medicine

## 2023-12-23 ENCOUNTER — Other Ambulatory Visit: Payer: Self-pay

## 2023-12-23 DIAGNOSIS — Z79899 Other long term (current) drug therapy: Secondary | ICD-10-CM | POA: Diagnosis not present

## 2023-12-23 DIAGNOSIS — Z853 Personal history of malignant neoplasm of breast: Secondary | ICD-10-CM | POA: Insufficient documentation

## 2023-12-23 DIAGNOSIS — T8130XA Disruption of wound, unspecified, initial encounter: Secondary | ICD-10-CM | POA: Insufficient documentation

## 2023-12-23 DIAGNOSIS — Y829 Unspecified medical devices associated with adverse incidents: Secondary | ICD-10-CM | POA: Insufficient documentation

## 2023-12-23 DIAGNOSIS — Z5189 Encounter for other specified aftercare: Secondary | ICD-10-CM

## 2023-12-23 DIAGNOSIS — I10 Essential (primary) hypertension: Secondary | ICD-10-CM | POA: Diagnosis not present

## 2023-12-23 DIAGNOSIS — T8131XA Disruption of external operation (surgical) wound, not elsewhere classified, initial encounter: Secondary | ICD-10-CM

## 2023-12-23 LAB — CBC
HCT: 40.6 % (ref 36.0–46.0)
Hemoglobin: 13.2 g/dL (ref 12.0–15.0)
MCH: 30.2 pg (ref 26.0–34.0)
MCHC: 32.5 g/dL (ref 30.0–36.0)
MCV: 92.9 fL (ref 80.0–100.0)
Platelets: 238 K/uL (ref 150–400)
RBC: 4.37 MIL/uL (ref 3.87–5.11)
RDW: 14.3 % (ref 11.5–15.5)
WBC: 6.3 K/uL (ref 4.0–10.5)
nRBC: 0 % (ref 0.0–0.2)

## 2023-12-23 LAB — BASIC METABOLIC PANEL WITH GFR
Anion gap: 13 (ref 5–15)
BUN: 20 mg/dL (ref 8–23)
CO2: 23 mmol/L (ref 22–32)
Calcium: 9.4 mg/dL (ref 8.9–10.3)
Chloride: 104 mmol/L (ref 98–111)
Creatinine, Ser: 0.62 mg/dL (ref 0.44–1.00)
GFR, Estimated: >60 mL/min
Glucose, Bld: 109 mg/dL — ABNORMAL HIGH (ref 70–99)
Potassium: 3.9 mmol/L (ref 3.5–5.1)
Sodium: 140 mmol/L (ref 135–145)

## 2023-12-23 LAB — LACTIC ACID, PLASMA: Lactic Acid, Venous: 1.6 mmol/L (ref 0.5–1.9)

## 2023-12-23 NOTE — Discharge Instructions (Signed)
 Please keep the area clean and dry, he can cover it with gauze.  Please call Dr. Marthenia office on Monday to schedule follow-up.  There is no evidence of infection at this time.  Please keep an eye on it, if you have any purulent drainage, increasing redness around there that is new, or if any fever, please return to be seen.

## 2023-12-23 NOTE — ED Provider Notes (Signed)
 [facility] Provider Note    Event Date/Time   First MD Initiated Contact with Patient 12/23/23 1109     (approximate)   History   Post-op Problem   HPI  Janet Zuniga is a 68 y.o. female with history of right breast cancer status post radiation, currently on letrozole , presenting for wound drainage from her surgical incision site on her breast.  States that the Steri-Strips just fell off yesterday.  She noticed that the wound has not healed, there was some yellow clear drainage yesterday and earlier today.  Denies any purulent drainage.  States that she just completed radiation and that is why her breasts is erythematous, states that the redness is not new.  Denies any fever nausea vomiting.  No significant pain.  On independent chart review, she was seen by oncology on 19 December, during that visit they noted skin irritation and an eschar at the site of her previous surgery, has history of chronic back pain and ankylosing spondylitis.  She was seen by surgery on 23 October, she is status post right mastectomy with sentinel lymph node biopsy.  At that time she did note some pain in the axilla, no fever or chills, no drainage from incisions.     Physical Exam   Triage Vital Signs: ED Triage Vitals  Encounter Vitals Group     BP 12/23/23 0942 (!) 171/100     Girls Systolic BP Percentile --      Girls Diastolic BP Percentile --      Boys Systolic BP Percentile --      Boys Diastolic BP Percentile --      Pulse Rate 12/23/23 0942 93     Resp 12/23/23 0942 19     Temp 12/23/23 0942 98.2 F (36.8 C)     Temp src --      SpO2 12/23/23 0942 96 %     Weight 12/23/23 0941 163 lb (73.9 kg)     Height 12/23/23 0941 4' 11.5 (1.511 m)     Head Circumference --      Peak Flow --      Pain Score 12/23/23 0941 7     Pain Loc --      Pain Education --      Exclude from Growth Chart --     Most recent vital signs: Vitals:   12/23/23 0942  BP: (!) 171/100   Pulse: 93  Resp: 19  Temp: 98.2 F (36.8 C)  SpO2: 96%     General: Awake, no distress.  CV:  Good peripheral perfusion.  Resp:  Normal effort.  Abd:  No distention.  Other:  Surgical site on her right lower breast with eschar, there is some surrounding erythema that patient states is old from her radiation, no obvious drainage, that surgical site that is unhealed is about 4 x 3 cm, eschar noted overlying    ED Results / Procedures / Treatments   Labs (all labs ordered are listed, but only abnormal results are displayed) Labs Reviewed  BASIC METABOLIC PANEL WITH GFR - Abnormal; Notable for the following components:      Result Value   Glucose, Bld 109 (*)    All other components within normal limits  LACTIC ACID, PLASMA  CBC    PROCEDURES:  Critical Care performed: No  Procedures   MEDICATIONS ORDERED IN ED: Medications - No data to display   IMPRESSION / MDM / ASSESSMENT AND PLAN / ED COURSE  I reviewed the triage vital signs and the nursing notes.                              Differential diagnosis includes, but is not limited to, unhealing wound, early infection, no palpable fluctuance to suggest abscess.  Get labs, plan to reach out to surgery.  Patient's presentation is most consistent with acute presentation with potential threat to life or bodily function.  Independent interpretation of labs and imaging below.  Did reach out to surgery, he is in the OR at this time, was in a secure chat and we did hear back.  Surgery was able to come down to evaluate the patient, states that she is stable for outpatient follow-up, can follow-up with Dr. Marinda at his clinic.  No evidence of infection at this time. Considered but no indication for inpatient admission at this time. Will discharge with strict return precautions    Clinical Course as of 12/23/23 1150  Sun Dec 23, 2023  1126 Independent review of labs, no leukocytosis, lactic is not elevated, electrolytes  really deranged [TT]    Clinical Course User Index [TT] Waymond, Lorelle Cummins, MD     FINAL CLINICAL IMPRESSION(S) / ED DIAGNOSES   Final diagnoses:  Visit for wound check  Dehiscence of operative wound, initial encounter     Rx / DC Orders   ED Discharge Orders     None        Note:  This document was prepared using Dragon voice recognition software and may include unintentional dictation errors.    Waymond Lorelle Cummins, MD 12/23/23 1150

## 2023-12-23 NOTE — ED Triage Notes (Signed)
 Pt comes with open wound to right side breast area. Pt has surgery in Oct 8th. Pt states stripsv just now came off.   Pt states drainage oozing from it

## 2023-12-23 NOTE — Consult Note (Signed)
 Subjective:   CC: Lumpectomy wound dehiscence  HPI:  Janet Zuniga is a 68 y.o. female who is consulted by Waymond for evaluation of above cc.  Patient presents with concern of open former lumpectomy wound from lumpectomy back in early October 2025.  Patient states that Steri-Strips were placed shortly postop over the area and has never come off until last night.  She noticed that the wound was open and had some white drainage from the area at that time.  Currently denies any additional drainage and no significant amount of pain.  Patient states the redness around the skin has been present since starting radiation therapy.     Past Medical History:  has a past medical history of Ankylosing spondylitis (HCC), Anxiety, Arthritis, Blood in stool, Breast cancer (HCC) (2010), Chronic low back pain, Chronic pain, Depression, Headache, Hepatitis C (2007), Hip fracture (HCC), History of blood transfusion, Hypertension, Insomnia, OA (osteoarthritis), Osteoporosis, Personal history of chemotherapy, Personal history of radiation therapy, Pneumonia, Spinal stenosis of lumbar region, and Spondyloarthritis.  Past Surgical History:  has a past surgical history that includes Foot surgery (2000 and 2003); Knee surgery (Left, 2004); Intramedullary (im) nail intertrochanteric (Right, 01/12/2015); Femur fracture surgery; Tubal ligation; Breast biopsy (Right, 1983); Breast excisional biopsy (Left, 2010); Breast lumpectomy (Left, 2010); Fracture surgery; Shoulder arthroscopy with subacromial decompression, rotator cuff repair and bicep tendon repair (Right, 07/12/2021); Breast biopsy (Right, 09/19/2023); Breast biopsy (Right, 10/04/2023); Breast lumpectomy with radio frequency localizer (Right, 10/10/2023); and Axillary sentinel node biopsy (Right, 10/10/2023).  Family History: family history includes Arthritis in her brother and mother; Depression in her brother and mother; Diabetes in her brother; Heart disease in her maternal aunt  and mother; Hyperlipidemia in her brother and mother; Hypertension in her brother, father, and mother; Osteoporosis in her mother; Seizures in her mother and sister; Stroke in her mother and sister.  Social History:  reports that she has been smoking cigarettes. She has a 5 pack-year smoking history. She has been exposed to tobacco smoke. She has never used smokeless tobacco. She reports current alcohol use of about 1.0 standard drink of alcohol per week. She reports that she does not currently use drugs after having used the following drugs: Marijuana.  Current Medications:  Prior to Admission medications  Medication Sig Start Date End Date Taking? Authorizing Provider  busPIRone  (BUSPAR ) 30 MG tablet Take 30 mg by mouth in the morning and at bedtime. 07/18/17   [provider]  Calcium Carbonate-Vit D-Min (CALCIUM 1200 PO) Take 1,200 mg by mouth daily. Patient not taking: Reported on 12/21/2023    [provider]  Cholecalciferol (VITAMIN D) 50 MCG (2000 UT) tablet Take 4,000 Units by mouth daily. Patient not taking: Reported on 12/21/2023    [provider]  FLUoxetine  (PROZAC ) 20 MG capsule Take 20 mg by mouth daily. Patient not taking: Reported on 12/21/2023 01/15/23   [provider]  ibuprofen  (ADVIL ) 600 MG tablet Take 1 tablet (600 mg total) by mouth every 6 (six) hours as needed. 11/11/23   Ward, Josette SAILOR, DO  letrozole  (FEMARA ) 2.5 MG tablet Take 1 tablet (2.5 mg total) by mouth daily. 12/21/23   Jacobo Evalene PARAS, MD  ondansetron  (ZOFRAN -ODT) 4 MG disintegrating tablet Take 1 tablet (4 mg total) by mouth every 6 (six) hours as needed for nausea or vomiting. 11/11/23   Ward, Josette SAILOR, DO  rosuvastatin (CRESTOR) 20 MG tablet Take 20 mg by mouth every 14 (fourteen) days. Patient not taking: Reported on  12/21/2023 06/17/22   [provider]  tiZANidine (ZANAFLEX) 4 MG tablet Take 4 mg by mouth every 6 (six) hours as needed for muscle spasms.     [provider]    Allergies:  Allergies as of 12/23/2023 - Review Complete 12/23/2023  Allergen Reaction Noted   Tramadol  Nausea Only 10/12/2015   Penicillins Rash and Other (See Comments) 12/23/2013    ROS:  Pertinent positives and negatives noted in HPI   Objective:     BP (!) 154/86 (BP Location: Right Arm)   Pulse 73   Temp 98.2 F (36.8 C) (Oral)   Resp 16   Ht 4' 11.5 (1.511 m)   Wt 73.9 kg   SpO2 100%   BMI 32.37 kg/m    Constitutional :  alert, cooperative, appears stated age, and no distress  Respiratory:  Clear to auscultation bilaterally  Cardiovascular:  Regular rate and rhythm  Skin: Cool and moist.  Chaperone present for exam.  Examination of the wound notes methylene blue  dyed subcutaneous fat and some dried granulation tissue with no depth.  Surrounding nonblanching erythema consistent with radiation changes.  No tenderness to palpation or active drainage.  Please see image below  Psychiatric: Normal affect, non-agitated, not confused          LABS:     Latest Ref Rng & Units 12/23/2023    9:42 AM 10/10/2023    9:34 AM 06/14/2023    3:31 PM  CMP  Glucose 70 - 99 mg/dL 890  879  91   BUN 8 - 23 mg/dL 20  24  19    Creatinine 0.44 - 1.00 mg/dL 9.37  9.33  9.39   Sodium 135 - 145 mmol/L 140  138  144   Potassium 3.5 - 5.1 mmol/L 3.9  3.6  3.9   Chloride 98 - 111 mmol/L 104  107  108   CO2 22 - 32 mmol/L 23  20  23    Calcium 8.9 - 10.3 mg/dL 9.4  8.7  9.1   Total Protein 6.5 - 8.1 g/dL   7.5   Total Bilirubin 0.0 - 1.2 mg/dL   0.5   Alkaline Phos 38 - 126 U/L   57   AST 15 - 41 U/L   29   ALT 0 - 44 U/L   30       Latest Ref Rng & Units 12/23/2023    9:42 AM 12/10/2023    9:55 AM 11/26/2023   12:03 PM  CBC  WBC 4.0 - 10.5 K/uL 6.3  7.0  9.9   Hemoglobin 12.0 - 15.0 g/dL 86.7  87.6  87.5   Hematocrit 36.0 - 46.0 % 40.6  38.3  36.9   Platelets 150 - 400 K/uL 238  232  291      RADS: N/a  Assessment:      Postlumpectomy  wound dehiscence.  No evidence of active infection.  Unsure when the wound opened and how long it has been open since patient states Steri-Strips were over the area until recently.  Plan:    Discussed him starting wet-to-dry dressing changes and follow-up with original surgeon Dr. Marinda as an outpatient basis for further monitoring of the area.  Patient does state that she has radiation therapy scheduled for 1 more session this week.  I explained we will discuss with the radiation oncologist to see if it is deemed appropriate to proceed with the last treatment.  Otherwise no signs of infection so antibiotics  is not needed.  The patient verbalized understanding and all questions were answered to the patient's satisfaction.  labs/images/medications/previous chart entries reviewed personally and relevant changes/updates noted above.

## 2023-12-24 ENCOUNTER — Ambulatory Visit

## 2023-12-24 ENCOUNTER — Ambulatory Visit: Admission: RE | Admit: 2023-12-24

## 2023-12-24 ENCOUNTER — Encounter: Payer: Self-pay | Admitting: *Deleted

## 2023-12-25 ENCOUNTER — Encounter: Payer: Self-pay | Admitting: General Surgery

## 2023-12-25 ENCOUNTER — Ambulatory Visit: Admitting: General Surgery

## 2023-12-25 VITALS — BP 169/82 | HR 118 | Temp 98.5°F | Ht 59.0 in | Wt 163.6 lb

## 2023-12-25 DIAGNOSIS — C50811 Malignant neoplasm of overlapping sites of right female breast: Secondary | ICD-10-CM

## 2023-12-25 DIAGNOSIS — Z08 Encounter for follow-up examination after completed treatment for malignant neoplasm: Secondary | ICD-10-CM

## 2023-12-25 DIAGNOSIS — C50911 Malignant neoplasm of unspecified site of right female breast: Secondary | ICD-10-CM

## 2023-12-25 NOTE — Radiation Completion Notes (Signed)
 Patient Name: Janet Zuniga, Janet Zuniga MRN: [id number] Date of Birth: [birth year] Referring Physician: EVALENE REUSING, M.D. Date of Service: 2023-12-25 Radiation Oncologist: Marcey Penton, M.D. 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  She recently presented with an abnormal mammogram of her right breast showing an indeterminant area of architectural distortion approximately 6 mm in the right outer breast.  There was no suspicious right axillary adenopathy.  Ultrasound-guided biopsy was performed which was positive for invasive mammary carcinoma.  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  Margins were negative for carcinoma.  Tumor was ER/PR positive HER2/neu not overexpressed.  Oncotype DX was 10 and she will not receive systemic therapy.  She is seen today for radiation oncology opinion.  She is doing fairly well her right breast is still somewhat swollen most likely seroma present.  Incision is well-healed no dominant masses noted in either breast no axillary or supraclavicular adenopathy is appreciated.      ==========DELIVERED PLANS==========  First Treatment Date: 2023-11-21 Last Treatment Date: 2023-12-21   Plan Name: Breast_R Site: Breast, Right Technique: 3D Mode: Photon Dose Per Fraction: 2.66 Gy Prescribed Dose (Delivered / Prescribed): 42.56 Gy / 42.56 Gy Prescribed Fxs (Delivered / Prescribed): 16 / 16   Plan Name: Breast_Rt_Bst Site: Breast, Right Technique: 3D Mode: Photon Dose Per Fraction: 2 Gy Prescribed Dose  (Delivered / Prescribed): 8 Gy / 10 Gy Prescribed Fxs (Delivered / Prescribed): 4 / 5     ==========ON TREATMENT VISIT DATES========== 2023-11-27, 2023-12-04, 2023-12-12, 2023-12-18, 2023-12-24     ==========UPCOMING VISITS========== 02/07/2024 [facility] PRACT[facility] NEW PT - OFFICE VISIT Valerio Melanie DASEN, NP  01/24/2024 [facility]  01/24/2024 [facility] RAD ONCOLOGY FOLLOW UP 30 Penton Marcey, MD  01/08/2024 [facility] SURGICAL POST OP Marinda Jayson KIDD, MD  12/25/2023 [facility] [facility]ICAL POST OP Marinda Jayson KIDD, MD        ==========APPENDIX - ON TREATMENT VISIT NOTES==========   See weekly On Treatment Notes in Epic for details in the Media tab (listed as Progress notes on the On Treatment Visit Dates listed above).

## 2023-12-25 NOTE — Patient Instructions (Signed)
 Removing Dead or Infected Tissue for Healing (Surgical Wound Debridement): What to Know After After having dead tissue removed from a wound with surgical wound debridement, it's common to have some pain or soreness. It's also common to have: Fluid that leaks from your wound. Stiffness. A larger wound because the dead or infected tissue has been removed. Follow these instructions at home: Medicines Take your medicines only as told. If you were given antibiotics, take them as told. Do not stop taking them even if you start to feel better. Ask if it's safe to drive or use machines while taking your medicine. Eating and drinking  Eat a healthy diet with lots of protein. Sources of protein include: Meats. Cheese. Nuts. Beans. Protein supplement drinks. Ask your provider what the best diet is for you. Talk with an expert in healthy eating called a dietitian to learn what foods you can and can't have. Drink more fluids as told. Do not drink alcohol. Wound care Take care of your wound as told. Make sure you: Wash your hands with soap and water  for at least 20 seconds before and after you change your bandage. If you can't use soap and water , use hand sanitizer. Change your bandage. Leave stitches or skin glue alone. Leave tape strips alone unless you're told to take them off. You may trim the edges of the tape strips if they curl up. Check the area around your wound every day for signs of infection. Check for: More redness, swelling, or pain. More fluid or blood. Warmth. Pus or a bad smell. Do not take baths, swim, or use a hot tub until you're told it's OK. Ask if you can shower. Activity Rest as told. Ask if it's OK for you to lift. Ask what things are safe for you to do at home. Ask when you can go back to work or school. General instructions Do not smoke, vape, or use nicotine or tobacco. If you were given a sedative, do not drive or use machines until you're told it's safe. A  sedative can make you sleepy. Your provider may give you more instructions. Make sure you know what you can and can't do. Contact a health care provider if: You have a fever. Your pain isn't getting better with medicine. You have any signs of an infection. Your wound bed declines. This means that areas that were red and pink get new dead tissue. That tissue may be: Black. [place]. Yellow. Your wound isn't getting better after 1-2 weeks of treatment. You have a new medical problem, such as: Diabetes. Peripheral vascular disease. A disease that affects your body's defense system (immune system). Get help right away if: You have a fever and also have more pain and redness around your wound. This information is not intended to replace advice given to you by your health care provider. Make sure you discuss any questions you have with your health care provider. Document Revised: 08/24/2022 Document Reviewed: 08/24/2022 Elsevier Patient Education  2024 [employer].

## 2023-12-31 ENCOUNTER — Telehealth: Payer: Self-pay | Admitting: Pharmacy Technician

## 2023-12-31 NOTE — Progress Notes (Signed)
 Outpatient Surgical Follow Up    Janet Zuniga is an 68 y.o. female.   Chief Complaint  Patient presents with   Routine Post Op    Right breast lumpectomy 10/10/23    HPI: The patient returns in clinic after undergoing a right lumpectomy with sentinel lymph node biopsy for invasive ductal carcinoma.  She completed her radiation therapy but noticed that there was an opening of her wound.  She says that the area is painful.  She denies any drainage from the wound.  She reports that she first noticed this a couple of days ago.  She denies any bleeding.  Past Medical History:  Diagnosis Date   Ankylosing spondylitis (HCC)    Anxiety    Arthritis    Blood in stool    Breast cancer (HCC) 2010   Left breast- chemo/radiation   Chronic low back pain    Chronic pain    Depression    Headache    Hepatitis C 2007   Hip fracture (HCC)    History of blood transfusion    Hypertension    Insomnia    OA (osteoarthritis)    Osteoporosis    Personal history of chemotherapy    Personal history of radiation therapy    Pneumonia    Spinal stenosis of lumbar region    Spondyloarthritis     Past Surgical History:  Procedure Laterality Date   AXILLARY SENTINEL NODE BIOPSY Right 10/10/2023   Procedure: BIOPSY, LYMPH NODE, SENTINEL, AXILLARY;  Surgeon: Marinda Jayson KIDD, MD;  Location: [facility];  Service: General;  Laterality: Right;   BREAST BIOPSY Right 1983   neg   BREAST BIOPSY Right 09/19/2023   US  RT BREAST BX W LOC DEV 1ST LESION IMG BX SPEC US  GUIDE 09/19/2023 [facility]   BREAST BIOPSY Right 10/04/2023   MM RT BREAST SAVI/RF TAG 1ST LESION MAMMO GUIDE 10/04/2023 [facility]   BREAST EXCISIONAL BIOPSY Left 2010   triple neg   BREAST LUMPECTOMY Left 2010   BREAST LUMPECTOMY WITH RADIO FREQUENCY LOCALIZER Right 10/10/2023   Procedure: BREAST LUMPECTOMY WITH RADIO FREQUENCY LOCALIZER;  Surgeon: Marinda Jayson KIDD, MD;  Location: [facility];  Service: General;  Laterality: Right;    FEMUR FRACTURE SURGERY     FOOT SURGERY  2000 and 2003   FRACTURE SURGERY     INTRAMEDULLARY (IM) NAIL INTERTROCHANTERIC Right 01/12/2015   Procedure: INTRAMEDULLARY (IM) NAIL INTERTROCHANTRIC;  Surgeon: Kayla Pinal, MD;  Location: [facility];  Service: Orthopedics;  Laterality: Right;   KNEE SURGERY Left 2004   SHOULDER ARTHROSCOPY WITH SUBACROMIAL DECOMPRESSION, ROTATOR CUFF REPAIR AND BICEP TENDON REPAIR Right 07/12/2021   Procedure: RIGHT SHOULDER ARTHROSCOPY WITH DEBRIDEMENT, DECOMPRESSION, ROTATOR CUFF REPAIR, AND  BICEPS TENODESIS.;  Surgeon: Edie Norleen PARAS, MD;  Location: [facility];  Service: Orthopedics;  Laterality: Right;   TUBAL LIGATION      Family History  Problem Relation Age of Onset   Arthritis Mother    Stroke Mother    Heart disease Mother    Hypertension Mother    Hyperlipidemia Mother    Depression Mother    Seizures Mother    Osteoporosis Mother    Hypertension Father    Seizures Sister    Stroke Sister    Heart disease Maternal Aunt    Hypertension Brother    Arthritis Brother    Depression Brother    Diabetes Brother    Hyperlipidemia Brother    COPD Neg Hx    Cancer Neg Hx  Social History:  reports that she has been smoking cigarettes. She has a 5 pack-year smoking history. She has been exposed to tobacco smoke. She has never used smokeless tobacco. She reports current alcohol use of about 1.0 standard drink of alcohol per week. She reports that she does not currently use drugs after having used the following drugs: Marijuana.  Allergies: Allergies[1]  Medications reviewed.    ROS Full ROS performed and is otherwise negative other than what is stated in HPI   BP (!) 169/82   Pulse (!) 118   Temp 98.5 F (36.9 C) (Oral)   Ht 4' 11 (1.499 m)   Wt 163 lb 9.6 oz (74.2 kg)   SpO2 98%   BMI 33.04 kg/m   Physical Exam  Right breast exam performed in the presence of a chaperone.  On the right breast there are radiation changes over her skin.   It is slightly erythematous.  It is painful to palpation.  On the inferior edge of her breast where her surgical scar was there is an area of opening.  There is some eschar over this.  I was able to scrape some of this eschar off back to healthy minimal bleeding breast tissue.  There was evacuation of a small seroma.   No results found for this or any previous visit (from the past 48 hours). No results found.  Assessment/Plan:  Patient status post right lumpectomy with sentinel lymph node biopsy.  She underwent radiation therapy and has developed a radiation wound at the area of her scar.  There was an eschar over this that I scraped off with a curette.  The wound was then covered with antimicrobial infused foam and dressed with ABD pads and tape.  I did discuss with her that I am a little bit worried about the wound healing capabilities of this secondary to the radiation changes.  We will use this microbial infused foam for 2 weeks and see her back then.  I discussed with her if there is no significant improvement in the wound we may need to refer her to wound clinic.  A total of 30 minutes was spent reviewing the patient's chart, performing a history and physical and discussing treatment options with the patient   Jayson Endow, M.D. [facility]     [1]  Allergies Allergen Reactions   Tramadol  Nausea Only   Penicillins Rash and Other (See Comments)    Has patient had a PCN reaction causing immediate rash, facial/tongue/throat swelling, SOB or lightheadedness with hypotension: Yes Has patient had a PCN reaction causing severe rash involving mucus membranes or skin necrosis: No Has patient had a PCN reaction that required hospitalization No Has patient had a PCN reaction occurring within the last 10 years: No If all of the above answers are NO, then may proceed with Cephalosporin use.

## 2023-12-31 NOTE — Telephone Encounter (Signed)
 Spoke with patient regarding whether I had received her invoice for rent.  Made patient aware that I had received it and submitted the request for payment.  Also made patient aware that it will take 2 to 3 weeks before [employer] receives the check.  Patient stated that she would make her landlord aware.  [name] Patient Pharmacologist [facility]

## 2024-01-04 ENCOUNTER — Other Ambulatory Visit

## 2024-01-08 ENCOUNTER — Encounter: Payer: Self-pay | Admitting: General Surgery

## 2024-01-08 ENCOUNTER — Ambulatory Visit: Admitting: General Surgery

## 2024-01-08 VITALS — BP 175/93 | HR 105 | Temp 98.7°F | Ht 59.0 in | Wt 166.0 lb

## 2024-01-08 DIAGNOSIS — C50911 Malignant neoplasm of unspecified site of right female breast: Secondary | ICD-10-CM

## 2024-01-08 DIAGNOSIS — Z08 Encounter for follow-up examination after completed treatment for malignant neoplasm: Secondary | ICD-10-CM

## 2024-01-08 DIAGNOSIS — C50811 Malignant neoplasm of overlapping sites of right female breast: Secondary | ICD-10-CM

## 2024-01-08 NOTE — Patient Instructions (Signed)
 External Beam Radiation Therapy, Care After The following information offers guidance on how to care for yourself after your procedure. Your health care provider may also give you more specific instructions. If you have problems or questions, contact your health care provider. What can I expect after the procedure? After the procedure, it is common to have: Tiredness (fatigue). Red, flaking, dry skin in the treated area. A sunburn-like rash on the skin in the treated area. Itching in the treated area. Other side effects may occur, depending on which part of the body was exposed to radiation and how much radiation was used. These side effects may include: Hair loss if the radiation therapy was directed to the head. Coughing or difficulty swallowing if the radiation therapy was directed to the head, neck, or chest. A type of swelling called lymphedema if the radiation therapy was directed to the head, neck, or chest. Nausea, vomiting, or diarrhea if the radiation therapy was directed to the abdomen or pelvis. Bladder problems, such as urinating often, or a lowered ability or desire to have sex (sexual dysfunction). These problems may happen if the radiation therapy was directed to the bladder, kidney, or prostate. Memory loss and thinking problems (cognitive changes) if the radiation therapy was directed to the brain. Some side effects may show up months to years later. However, most side effects are usually temporary and get better over time. It can take up to 3-4 weeks for you to regain your energy or for side effects to get better. Follow these instructions at home: Radiation therapy affects everyone differently. Some people do not have side effects. You can take steps at home to help prevent or manage side effects. Skin care  Wash your skin with a mild soap as told by your health care provider. Do not scrub or rub your skin. Pat yourself dry. Use a mild shampoo and be gentle when washing your  hair. Apply gentle lotion or cream to the treated area as told by your health care provider. Keep the treated area covered when you are outside. Do not expose treated skin to the sun. Avoid scratching the treated area. Eating and drinking Eat small nutritious meals and snacks regularly during the day. Choose bland and soft foods that are easy to eat. Follow your health care provider's advice on the type and amount of liquids to drink each day. If you have diarrhea, drink plenty of clear fluids. General instructions Do not use a heating pad or a warm cloth to relieve pain in the treated area. Take over-the-counter and prescription medicines only as told by your health care provider. Try to maintain your weight during treatment. Ask your health care team for tips. Keep all follow-up visits. This is important. The visits are usually scheduled 6 weeks to 6 months after radiation therapy. They are needed to determine if the radiation therapy worked as expected. Contact a health care provider if: You have any of the following in the treated area: Pain. Open skin or blisters. You have a fever or chills. You have nausea or vomiting that lasts more than 2 days. You have diarrhea that lasts longer than 2 days. You lose weight without trying to. Get help right away if: You are unable to swallow. You have difficulty breathing. You have severe vomiting or diarrhea. These symptoms may be an emergency. Get help right away. Call 911. Do not wait to see if the symptoms will go away. Do not drive yourself to the hospital. Summary After this  procedure, it is common to have tiredness (fatigue), skin changes, and other side effects, depending on where the radiation therapy was given. Some side effects may show up months to years later. However, most side effects are usually temporary and get better over time. It can take up to 3-4 weeks for you to regain your energy or for side effects to get better. Keep  all follow-up visits. This is important. The visits are usually scheduled 6 weeks to 6 months after radiation therapy. This information is not intended to replace advice given to you by your health care provider. Make sure you discuss any questions you have with your health care provider. Document Revised: 10/19/2020 Document Reviewed: 10/19/2020 Elsevier Patient Education  2024 [employer].

## 2024-01-17 NOTE — Progress Notes (Signed)
 Outpatient Surgical Follow Up    Janet Zuniga is an 69 y.o. female.   Chief Complaint  Patient presents with   Routine Post Op    Breast lumpectomy 10/10/2023    HPI: Janet Zuniga returns today for her right breast.  She underwent lumpectomy with sentinel lymph node biopsy.  She then underwent radiation therapy.  Unfortunately after radiation therapy she had an opening of where her incision was.  This was several weeks after surgery.  Last time she was in the office we debrided some of the tissue and placed some gauze over it.  She reports continued pain but has been dressing the wound.  She denies any nausea or vomiting.  She denies any purulent drainage from the wound.  Past Medical History:  Diagnosis Date   Ankylosing spondylitis (HCC)    Anxiety    Arthritis    Blood in stool    Breast cancer (HCC) 2010   Left breast- chemo/radiation   Chronic low back pain    Chronic pain    Depression    Headache    Hepatitis C 2007   Hip fracture (HCC)    History of blood transfusion    Hypertension    Insomnia    OA (osteoarthritis)    Osteoporosis    Personal history of chemotherapy    Personal history of radiation therapy    Pneumonia    Spinal stenosis of lumbar region    Spondyloarthritis     Past Surgical History:  Procedure Laterality Date   AXILLARY SENTINEL NODE BIOPSY Right 10/10/2023   Procedure: BIOPSY, LYMPH NODE, SENTINEL, AXILLARY;  Surgeon: Marinda Janet KIDD, MD;  Location: [facility];  Service: General;  Laterality: Right;   BREAST BIOPSY Right 1983   neg   BREAST BIOPSY Right 09/19/2023   US  RT BREAST BX W LOC DEV 1ST LESION IMG BX SPEC US  GUIDE 09/19/2023 [facility]   BREAST BIOPSY Right 10/04/2023   MM RT BREAST SAVI/RF TAG 1ST LESION MAMMO GUIDE 10/04/2023 [facility]   BREAST EXCISIONAL BIOPSY Left 2010   triple neg   BREAST LUMPECTOMY Left 2010   BREAST LUMPECTOMY WITH RADIO FREQUENCY LOCALIZER Right 10/10/2023   Procedure: BREAST LUMPECTOMY WITH  RADIO FREQUENCY LOCALIZER;  Surgeon: Marinda Janet KIDD, MD;  Location: [facility];  Service: General;  Laterality: Right;   FEMUR FRACTURE SURGERY     FOOT SURGERY  2000 and 2003   FRACTURE SURGERY     INTRAMEDULLARY (IM) NAIL INTERTROCHANTERIC Right 01/12/2015   Procedure: INTRAMEDULLARY (IM) NAIL INTERTROCHANTRIC;  Surgeon: Kayla Pinal, MD;  Location: [facility];  Service: Orthopedics;  Laterality: Right;   KNEE SURGERY Left 2004   SHOULDER ARTHROSCOPY WITH SUBACROMIAL DECOMPRESSION, ROTATOR CUFF REPAIR AND BICEP TENDON REPAIR Right 07/12/2021   Procedure: RIGHT SHOULDER ARTHROSCOPY WITH DEBRIDEMENT, DECOMPRESSION, ROTATOR CUFF REPAIR, AND  BICEPS TENODESIS.;  Surgeon: Edie Norleen PARAS, MD;  Location: [facility];  Service: Orthopedics;  Laterality: Right;   TUBAL LIGATION      Family History  Problem Relation Age of Onset   Arthritis Mother    Stroke Mother    Heart disease Mother    Hypertension Mother    Hyperlipidemia Mother    Depression Mother    Seizures Mother    Osteoporosis Mother    Hypertension Father    Seizures Sister    Stroke Sister    Heart disease Maternal Aunt    Hypertension Brother    Arthritis Brother    Depression Brother  Diabetes Brother    Hyperlipidemia Brother    COPD Neg Hx    Cancer Neg Hx     Social History:  reports that she has been smoking cigarettes. She has a 5 pack-year smoking history. She has been exposed to tobacco smoke. She has never used smokeless tobacco. She reports current alcohol use of about 1.0 standard drink of alcohol per week. She reports that she does not currently use drugs after having used the following drugs: Marijuana.  Allergies: Allergies[1]  Medications reviewed.    ROS Full ROS performed and is otherwise negative other than what is stated in HPI   BP (!) 175/93   Pulse (!) 105   Temp 98.7 F (37.1 C) (Oral)   Ht 4' 11 (1.499 m)   Wt 166 lb (75.3 kg)   SpO2 98%   BMI 33.53 kg/m   Physical Exam Breast  exam performed in the presence of a chaperone.  Right breast there continues to be radiation changes throughout the inferior outer quadrant.  Where the surgical scar was there is an area of opening.  There is no necrotic tissue here.  It does tunnel into the breast parenchyma 2 to 3 cm.  There is no eschar or debride about tissue.  The wound measures 5 cm x 3.75 cm.  Moist gauze was packed into the tunneling and covered over the incision.  This was dressed with gauze.    No results found for this or any previous visit (from the past 48 hours). No results found.  Assessment/Plan:  Instructed patient on wound care instructions.  She should pack the wound once daily.  She can place gauze and ABD pads over this.  We will see if it does shrink in size over the next several weeks.  If it does not she may need wound care consult given that these are radiation burns.   Janet Zuniga, M.D. [facility]     [1]  Allergies Allergen Reactions   Tramadol  Nausea Only   Penicillins Rash and Other (See Comments)    Has patient had a PCN reaction causing immediate rash, facial/tongue/throat swelling, SOB or lightheadedness with hypotension: Yes Has patient had a PCN reaction causing severe rash involving mucus membranes or skin necrosis: No Has patient had a PCN reaction that required hospitalization No Has patient had a PCN reaction occurring within the last 10 years: No If all of the above answers are NO, then may proceed with Cephalosporin use.

## 2024-01-18 ENCOUNTER — Telehealth: Payer: Self-pay | Admitting: Pharmacy Technician

## 2024-01-18 NOTE — Telephone Encounter (Signed)
 Patient inquiring about when the check for her January rent would be cut and mailed to landlord.  Spoke with KB in A/P.  Check to be issued by 01/25/24 and mailed.  Called patient to make aware.  Patient asked that I contact her landlord to make aware.  Contacted Kendrell at [employer].  [name] stated that she would note that [employer] [facility] is paying the rent.  Made patient aware that I had spoken with Kendrell.  Janet Zuniga Patient Services Navigator [facility]

## 2024-01-24 ENCOUNTER — Other Ambulatory Visit

## 2024-01-24 ENCOUNTER — Ambulatory Visit: Admitting: Radiation Oncology

## 2024-02-01 ENCOUNTER — Telehealth: Payer: Self-pay | Admitting: Pharmacy Technician

## 2024-02-01 NOTE — Telephone Encounter (Signed)
 Patient called stating that check from grant for January rent had not been received. It was discovered by A/P that the check was not mailed.  A staff member in A/P hand delivered the rent check to [employer] location.  Notified patient.    Janet Zuniga Patient Services Navigator [facility]

## 2024-02-02 DIAGNOSIS — I011 Acute rheumatic endocarditis: Secondary | ICD-10-CM | POA: Insufficient documentation

## 2024-02-02 NOTE — Patient Instructions (Incomplete)
 Healthy Eating for Your Heart Many factors influence your heart health, including eating and exercise habits. Heart health is also called coronary health. Coronary risk increases with abnormal blood fat (lipid) levels. A heart-healthy eating plan includes limiting unhealthy fats, increasing healthy fats, limiting salt (sodium) intake, and making other diet and lifestyle changes. What is my plan? Your health care provider may recommend that: You limit your fat intake to _________% or less of your total calories each day. You limit your saturated fat intake to _________% or less of your total calories each day. You limit the amount of cholesterol in your diet to less than _________ mg per day. You limit the amount of sodium in your diet to less than _________ mg per day. What are tips for following this plan? Cooking Cook foods using methods other than frying. Baking, boiling, grilling, and broiling are all good options. Other ways to reduce fat include: Removing the skin from poultry. Removing all visible fats from meats. Steaming vegetables in water or broth. Meal planning  At meals, imagine dividing your plate into fourths: Fill one-half of your plate with vegetables and green salads. Fill one-fourth of your plate with whole grains. Fill one-fourth of your plate with lean protein foods. Eat 2-4 cups of vegetables per day. One cup of vegetables equals 1 cup (91 g) broccoli or cauliflower florets, 2 medium carrots, 1 large bell pepper, 1 large sweet potato, 1 large tomato, 1 medium white potato, 2 cups (150 g) raw leafy greens. Eat 1-2 cups of fruit per day. One cup of fruit equals 1 small apple, 1 large banana, 1 cup (237 g) mixed fruit, 1 large orange,  cup (82 g) dried fruit, 1 cup (240 mL) 100% fruit juice. Eat more foods that contain soluble fiber. Examples include apples, broccoli, carrots, beans, peas, and barley. Aim to get 25-30 g of fiber per day. Increase your consumption of  legumes, nuts, and seeds to 4-5 servings per week. One serving of dried beans or legumes equals  cup (90 g) cooked, 1 serving of nuts is  oz (12 almonds, 24 pistachios, or 7 walnut halves), and 1 serving of seeds equals  oz (8 g). Fats Choose healthy fats more often. Choose monounsaturated and polyunsaturated fats, such as olive and canola oils, avocado oil, flaxseeds, walnuts, almonds, and seeds. Eat more omega-3 fats. Choose salmon, mackerel, sardines, tuna, flaxseed oil, and ground flaxseeds. Aim to eat fish at least 2 times each week. Check food labels carefully to identify foods with trans fats or high amounts of saturated fat. Limit saturated fats. These are found in animal products, such as meats, butter, and cream. Plant sources of saturated fats include palm oil, palm kernel oil, and coconut oil. Avoid foods with partially hydrogenated oils in them. These contain trans fats. Examples are stick margarine, some tub margarines, cookies, crackers, and other baked goods. Avoid fried foods. General information Eat more home-cooked food and less restaurant, buffet, and fast food. Limit or avoid alcohol. Limit foods that are high in added sugar and simple starches such as foods made using white refined flour (white breads, pastries, sweets). Lose weight if you are overweight. Losing just 5-10% of your body weight can help your overall health and prevent diseases such as diabetes and heart disease. Monitor your sodium intake, especially if you have high blood pressure. Talk with your health care provider about your sodium intake. Try to incorporate more vegetarian meals weekly. What foods should I eat? Fruits All fresh, canned (  in natural juice), or frozen fruits. Vegetables Fresh or frozen vegetables (raw, steamed, roasted, or grilled). Green salads. Grains Most grains. Choose whole wheat and whole grains most of the time. Rice and pasta, including brown rice and pastas made with whole  wheat. Meats and other proteins Lean, well-trimmed beef, veal, pork, and lamb. Chicken and turkey without skin. All fish and shellfish. Wild duck, rabbit, pheasant, and venison. Egg whites or low-cholesterol egg substitutes. Dried beans, peas, lentils, and tofu. Seeds and most nuts. Dairy Low-fat or nonfat cheeses, including ricotta and mozzarella. Skim or 1% milk (liquid, powdered, or evaporated). Buttermilk made with low-fat milk. Nonfat or low-fat yogurt. Fats and oils Non-hydrogenated (trans-free) margarines. Vegetable oils, including soybean, sesame, sunflower, olive, avocado, peanut, safflower, corn, canola, and cottonseed. Salad dressings or mayonnaise made with a vegetable oil. Beverages Water (mineral or sparkling). Coffee and tea. Unsweetened ice tea. Diet beverages. Sweets and desserts Sherbet, gelatin, and fruit ice. Small amounts of dark chocolate. Limit all sweets and desserts. Seasonings and condiments All seasonings and condiments. The items listed above may not be a complete list of foods and beverages you can eat. Contact a dietitian for more options. What foods should I avoid? Fruits Canned fruit in heavy syrup. Fruit in cream or butter sauce. Fried fruit. Limit coconut. Vegetables Vegetables cooked in cheese, cream, or butter sauce. Fried vegetables. Grains Breads made with saturated or trans fats, oils, or whole milk. Croissants. Sweet rolls. Donuts. High-fat crackers, such as cheese crackers and chips. Meats and other proteins Fatty meats, such as hot dogs, ribs, sausage, bacon, rib-eye roast or steak. High-fat deli meats, such as salami and bologna. Caviar. Domestic duck and goose. Organ meats, such as liver. Dairy Cream, sour cream, cream cheese, and creamed cottage cheese. Whole-milk cheeses. Whole or 2% milk (liquid, evaporated, or condensed). Whole buttermilk. Cream sauce or high-fat cheese sauce. Whole-milk yogurt. Fats and oils Meat fat, or shortening. Cocoa  butter, hydrogenated oils, palm oil, coconut oil, palm kernel oil. Solid fats and shortenings, including bacon fat, salt pork, lard, and butter. Nondairy cream substitutes. Salad dressings with cheese or sour cream. Beverages Regular sodas and any drinks with added sugar. Sweets and desserts Frosting. Pudding. Cookies. Cakes. Pies. Milk chocolate or white chocolate. Buttered syrups. Full-fat ice cream or ice cream drinks. The items listed above may not be a complete list of foods and beverages to avoid. Contact a dietitian for more information. Summary Heart-healthy meal planning includes limiting unhealthy fats, increasing healthy fats, limiting salt (sodium) intake and making other diet and lifestyle changes. Lose weight if you are overweight. Losing just 5-10% of your body weight can help your overall health and prevent diseases such as diabetes and heart disease. Focus on eating a balance of foods, including fruits and vegetables, low-fat or nonfat dairy, lean protein, nuts and legumes, whole grains, and heart-healthy oils and fats. This information is not intended to replace advice given to you by your health care provider. Make sure you discuss any questions you have with your health care provider. Document Revised: 10/29/2023 Document Reviewed: 01/24/2021 [employer] Patient Education  2025 [employer].

## 2024-02-05 ENCOUNTER — Ambulatory Visit: Admitting: General Surgery

## 2024-02-07 ENCOUNTER — Ambulatory Visit: Admitting: Nurse Practitioner

## 2024-02-12 ENCOUNTER — Ambulatory Visit: Admitting: General Surgery

## 2024-03-26 ENCOUNTER — Inpatient Hospital Stay: Admitting: Oncology

## 2024-04-23 ENCOUNTER — Ambulatory Visit: Admitting: Family Medicine
